# Patient Record
Sex: Male | Born: 1960
Health system: Southern US, Community
[De-identification: ages and names within clinical notes are randomized; demographics above are authoritative.]

## PROBLEM LIST (undated history)

## (undated) DIAGNOSIS — I77810 Thoracic aortic ectasia: Secondary | ICD-10-CM

## (undated) DIAGNOSIS — I639 Cerebral infarction, unspecified: Secondary | ICD-10-CM

## (undated) DIAGNOSIS — R002 Palpitations: Secondary | ICD-10-CM

## (undated) DIAGNOSIS — I509 Heart failure, unspecified: Secondary | ICD-10-CM

## (undated) DIAGNOSIS — K5900 Constipation, unspecified: Secondary | ICD-10-CM

## (undated) DIAGNOSIS — I10 Essential (primary) hypertension: Secondary | ICD-10-CM

## (undated) DIAGNOSIS — R479 Unspecified speech disturbances: Secondary | ICD-10-CM

## (undated) DIAGNOSIS — F419 Anxiety disorder, unspecified: Secondary | ICD-10-CM

## (undated) DIAGNOSIS — E785 Hyperlipidemia, unspecified: Secondary | ICD-10-CM

## (undated) DIAGNOSIS — M79606 Pain in leg, unspecified: Secondary | ICD-10-CM

## (undated) DIAGNOSIS — M25561 Pain in right knee: Secondary | ICD-10-CM

## (undated) DIAGNOSIS — N400 Enlarged prostate without lower urinary tract symptoms: Secondary | ICD-10-CM

## (undated) DIAGNOSIS — F1990 Other psychoactive substance use, unspecified, uncomplicated: Secondary | ICD-10-CM

## (undated) DIAGNOSIS — G709 Myoneural disorder, unspecified: Secondary | ICD-10-CM

## (undated) DIAGNOSIS — G4733 Obstructive sleep apnea (adult) (pediatric): Principal | ICD-10-CM

## (undated) DIAGNOSIS — C801 Malignant (primary) neoplasm, unspecified: Secondary | ICD-10-CM

## (undated) DIAGNOSIS — I2510 Atherosclerotic heart disease of native coronary artery without angina pectoris: Secondary | ICD-10-CM

## (undated) DIAGNOSIS — F32A Depression, unspecified: Secondary | ICD-10-CM

## (undated) DIAGNOSIS — R5383 Other fatigue: Secondary | ICD-10-CM

## (undated) DIAGNOSIS — M25562 Pain in left knee: Secondary | ICD-10-CM

## (undated) DIAGNOSIS — Z9289 Personal history of other medical treatment: Secondary | ICD-10-CM

## (undated) DIAGNOSIS — G8929 Other chronic pain: Secondary | ICD-10-CM

## (undated) DIAGNOSIS — J449 Chronic obstructive pulmonary disease, unspecified: Secondary | ICD-10-CM

## (undated) DIAGNOSIS — J45909 Unspecified asthma, uncomplicated: Secondary | ICD-10-CM

## (undated) DIAGNOSIS — F1010 Alcohol abuse, uncomplicated: Secondary | ICD-10-CM

## (undated) DIAGNOSIS — F329 Major depressive disorder, single episode, unspecified: Secondary | ICD-10-CM

## (undated) DIAGNOSIS — M1990 Unspecified osteoarthritis, unspecified site: Secondary | ICD-10-CM

## (undated) DIAGNOSIS — G4730 Sleep apnea, unspecified: Secondary | ICD-10-CM

## (undated) DIAGNOSIS — M2550 Pain in unspecified joint: Secondary | ICD-10-CM

## (undated) DIAGNOSIS — K219 Gastro-esophageal reflux disease without esophagitis: Secondary | ICD-10-CM

## (undated) DIAGNOSIS — T7840XA Allergy, unspecified, initial encounter: Secondary | ICD-10-CM

## (undated) HISTORY — DX: Unspecified speech disturbances: R47.9

## (undated) HISTORY — DX: Other chronic pain: G89.29

## (undated) HISTORY — DX: Thoracic aortic ectasia: I77.810

## (undated) HISTORY — DX: Pain in unspecified joint: M25.50

## (undated) HISTORY — DX: Allergy, unspecified, initial encounter: T78.40XA

## (undated) HISTORY — DX: Myoneural disorder, unspecified: G70.9

## (undated) HISTORY — DX: Alcohol abuse, uncomplicated: F10.10

## (undated) HISTORY — DX: Cerebral infarction, unspecified: I63.9

## (undated) HISTORY — PX: OTHER SURGICAL HISTORY: SHX169

## (undated) HISTORY — DX: Unspecified osteoarthritis, unspecified site: M19.90

## (undated) HISTORY — DX: Pain in right knee: M25.561

## (undated) HISTORY — DX: Anxiety disorder, unspecified: F41.9

## (undated) HISTORY — PX: UPPER GASTROINTESTINAL ENDOSCOPY: SHX188

## (undated) HISTORY — DX: Essential (primary) hypertension: I10

## (undated) HISTORY — DX: Depression, unspecified: F32.A

## (undated) HISTORY — PX: COLONOSCOPY: SHX174

## (undated) HISTORY — DX: Hyperlipidemia, unspecified: E78.5

## (undated) HISTORY — DX: Obstructive sleep apnea (adult) (pediatric): G47.33

## (undated) HISTORY — DX: Chronic obstructive pulmonary disease, unspecified: J44.9

## (undated) HISTORY — DX: Benign prostatic hyperplasia without lower urinary tract symptoms: N40.0

## (undated) HISTORY — DX: Pain in leg, unspecified: M79.606

## (undated) HISTORY — DX: Heart failure, unspecified: I50.9

## (undated) HISTORY — DX: Personal history of other medical treatment: Z92.89

## (undated) HISTORY — DX: Constipation, unspecified: K59.00

## (undated) HISTORY — DX: Pain in left knee: M25.562

## (undated) HISTORY — DX: Sleep apnea, unspecified: G47.30

## (undated) HISTORY — DX: Atherosclerotic heart disease of native coronary artery without angina pectoris: I25.10

## (undated) HISTORY — DX: Major depressive disorder, single episode, unspecified: F32.9

## (undated) HISTORY — DX: Palpitations: R00.2

## (undated) HISTORY — DX: Other fatigue: R53.83

## (undated) HISTORY — DX: Malignant (primary) neoplasm, unspecified: C80.1

## (undated) HISTORY — DX: Other psychoactive substance use, unspecified, uncomplicated: F19.90

---

## 1999-05-28 ENCOUNTER — Encounter: Payer: Self-pay | Admitting: Emergency Medicine

## 1999-05-28 ENCOUNTER — Emergency Department (HOSPITAL_COMMUNITY): Admission: EM | Admit: 1999-05-28 | Discharge: 1999-05-28 | Payer: Self-pay | Admitting: Emergency Medicine

## 2004-10-27 DIAGNOSIS — G4733 Obstructive sleep apnea (adult) (pediatric): Secondary | ICD-10-CM

## 2004-10-27 HISTORY — DX: Obstructive sleep apnea (adult) (pediatric): G47.33

## 2005-02-06 ENCOUNTER — Ambulatory Visit: Payer: Self-pay

## 2008-10-27 HISTORY — PX: KNEE ARTHROSCOPY: SUR90

## 2008-12-19 ENCOUNTER — Ambulatory Visit (HOSPITAL_COMMUNITY): Admission: RE | Admit: 2008-12-19 | Discharge: 2008-12-20 | Payer: Self-pay | Admitting: Orthopedic Surgery

## 2009-10-27 HISTORY — PX: COLONOSCOPY: SHX174

## 2010-03-22 ENCOUNTER — Encounter: Payer: Self-pay | Admitting: Physician Assistant

## 2011-01-23 ENCOUNTER — Ambulatory Visit (INDEPENDENT_AMBULATORY_CARE_PROVIDER_SITE_OTHER): Payer: BC Managed Care – PPO | Admitting: Cardiology

## 2011-01-23 ENCOUNTER — Encounter: Payer: Self-pay | Admitting: Cardiology

## 2011-01-23 VITALS — BP 131/94 | HR 74 | Ht 76.0 in | Wt 301.2 lb

## 2011-01-23 DIAGNOSIS — E785 Hyperlipidemia, unspecified: Secondary | ICD-10-CM | POA: Insufficient documentation

## 2011-01-23 DIAGNOSIS — I10 Essential (primary) hypertension: Secondary | ICD-10-CM | POA: Insufficient documentation

## 2011-01-23 DIAGNOSIS — R079 Chest pain, unspecified: Secondary | ICD-10-CM | POA: Insufficient documentation

## 2011-01-23 NOTE — Progress Notes (Signed)
50 yo with history of DM, hyperlipidemia, and HTN presents for evaluation of left arm numbness and tingling.  Patient gets tingling down his left arm from the upper chest to the fingertips.  This occurs 3-4 times a day and lasts for about 2-3 minutes at a time.  It gets better if he shakes his arm.  No definite trigger though the symptoms sometimes occur if he is sitting in the car with his arm out the window or if he is sitting in one place for a long period of time.  No chest pain.  No exertional dyspnea.  He is quite active at his work and has no exercise limitations.  He recently was started on pravastatin for elevated LDL cholesterol.  It is now down to 113.  Patient is a nonsmoker. He is on metformin for his diabetes.    ECG: NSR, borderline 1st degree AV block  Labs (3/12): K 4, creatinine 1.03, LDL 113, HDL 33  PMH:  1. DM 2. Hyperlipidemia 3. HTN  SH: Nonsmoker.  Married, lives in [place].  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    FH: Adopted, does not know any family medical history.    ROS: All systems reviewed and negative except as per HPI.   Current Outpatient Prescriptions  Medication Sig Dispense Refill  . aspirin 81 MG tablet Take 81 mg by mouth at bedtime.        . felodipine (PLENDIL) 10 MG 24 hr tablet Take 10 mg by mouth daily.        Marland Kitchen HYDROcodone-acetaminophen (VICODIN ES) 7.5-750 MG per tablet Take 1 tablet by mouth every 6 (six) hours as needed.        Marland Kitchen ibuprofen (ADVIL,MOTRIN) 200 MG tablet Take 400 mg by mouth every 6 (six) hours as needed.        Marland Kitchen lisinopril-hydrochlorothiazide (PRINZIDE) 10-12.5 MG per tablet Take 1 tablet by mouth daily.        . metFORMIN (GLUCOPHAGE) 500 MG tablet Take 500 mg by mouth. 1 in the morning and 1/2 tablet at bedtime       . pravastatin (PRAVACHOL) 20 MG tablet Take 20 mg by mouth daily.          BP 131/94  Pulse 74  Ht 6\' 4"  (1.93 m)  Wt 301 lb 4 oz (136.646 kg)  BMI 36.67 kg/m2 General:  NAD, overweight.  Neck: No JVD, no thyromegaly or thyroid nodule.  Lungs: Clear to auscultation bilaterally with normal respiratory effort. CV: Nondisplaced PMI.  Heart regular S1/S2, no S3/S4, no murmur.  No peripheral edema.  No carotid bruit.  Normal pedal pulses.  Abdomen: Soft, nontender, no hepatosplenomegaly, no distention.  Skin: Intact without lesions or rashes.  Neurologic: Alert and oriented x 3.  Psych: Normal affect. Extremities: No clubbing or cyanosis.  HEENT: Normal.  MSK: No pain to palpation over posterior neck or shoulder area.

## 2011-01-23 NOTE — Patient Instructions (Signed)
Schedule an appt for a stress echo.  You do not need to schedule a follow-up appt with Dr Shirlee Latch.

## 2011-01-23 NOTE — Assessment & Plan Note (Signed)
Diastolic BP mildly elevated today.  No change to regimen.

## 2011-01-23 NOTE — Assessment & Plan Note (Signed)
Goal LDL < 100 with DM.  He is at 113.  He is trying to work on diet/exercise to lower cholesterol.  If he is not able to get to goal with lifestyle changes, would increase pravastatin to 40 mg daily.

## 2011-01-23 NOTE — Assessment & Plan Note (Signed)
Patient gets a tingling sensation from his upper chest into his left arm all the way to the fingertips.  This is not related to exertion.  My strongest suspicion is that this is neuropathic pain, perhaps from cervical osteoarthritis and impingement on nerve roots.  However, he has a number of risk factors for coronary disease, including DM, HTN, and hyperlipidemia.  I will arrange for him to get a stress echocardiogram for risk stratification.  He should continue ASA 81 mg daily.

## 2011-02-07 ENCOUNTER — Ambulatory Visit (HOSPITAL_COMMUNITY): Payer: BC Managed Care – PPO | Attending: Cardiology | Admitting: Radiology

## 2011-02-07 ENCOUNTER — Other Ambulatory Visit (HOSPITAL_COMMUNITY): Payer: Self-pay | Admitting: Radiology

## 2011-02-07 ENCOUNTER — Other Ambulatory Visit (INDEPENDENT_AMBULATORY_CARE_PROVIDER_SITE_OTHER): Payer: BC Managed Care – PPO | Admitting: *Deleted

## 2011-02-07 ENCOUNTER — Other Ambulatory Visit: Payer: Self-pay | Admitting: *Deleted

## 2011-02-07 ENCOUNTER — Other Ambulatory Visit: Payer: Self-pay | Admitting: Cardiology

## 2011-02-07 ENCOUNTER — Encounter: Payer: Self-pay | Admitting: *Deleted

## 2011-02-07 DIAGNOSIS — I729 Aneurysm of unspecified site: Secondary | ICD-10-CM

## 2011-02-07 DIAGNOSIS — R0989 Other specified symptoms and signs involving the circulatory and respiratory systems: Secondary | ICD-10-CM

## 2011-02-07 DIAGNOSIS — R079 Chest pain, unspecified: Secondary | ICD-10-CM

## 2011-02-07 DIAGNOSIS — R072 Precordial pain: Secondary | ICD-10-CM

## 2011-02-07 DIAGNOSIS — I719 Aortic aneurysm of unspecified site, without rupture: Secondary | ICD-10-CM

## 2011-02-07 LAB — BASIC METABOLIC PANEL
BUN: 14 mg/dL (ref 6–23)
CO2: 28 mEq/L (ref 19–32)
Calcium: 9.2 mg/dL (ref 8.4–10.5)
Chloride: 104 mEq/L (ref 96–112)
Creatinine, Ser: 0.8 mg/dL (ref 0.4–1.5)
GFR: 127.7 mL/min (ref >60.00)
Glucose, Bld: 87 mg/dL (ref 70–99)
Potassium: 3.7 mEq/L (ref 3.5–5.1)
Sodium: 141 mEq/L (ref 135–145)

## 2011-02-11 LAB — GLUCOSE, CAPILLARY
Glucose-Capillary: 125 mg/dL — ABNORMAL HIGH (ref 70–99)
Glucose-Capillary: 136 mg/dL — ABNORMAL HIGH (ref 70–99)
Glucose-Capillary: 144 mg/dL — ABNORMAL HIGH (ref 70–99)
Glucose-Capillary: 155 mg/dL — ABNORMAL HIGH (ref 70–99)
Glucose-Capillary: 162 mg/dL — ABNORMAL HIGH (ref 70–99)
Glucose-Capillary: 164 mg/dL — ABNORMAL HIGH (ref 70–99)

## 2011-02-11 LAB — COMPREHENSIVE METABOLIC PANEL
ALT: 27 U/L (ref 0–53)
AST: 16 U/L (ref 0–37)
Albumin: 4 g/dL (ref 3.5–5.2)
Alkaline Phosphatase: 60 U/L (ref 39–117)
BUN: 13 mg/dL (ref 6–23)
CO2: 28 mEq/L (ref 19–32)
Calcium: 9.8 mg/dL (ref 8.4–10.5)
Chloride: 103 mEq/L (ref 96–112)
Creatinine, Ser: 0.83 mg/dL (ref 0.4–1.5)
GFR calc Af Amer: >60 mL/min (ref >60)
GFR calc non Af Amer: >60 mL/min (ref >60)
Glucose, Bld: 164 mg/dL — ABNORMAL HIGH (ref 70–99)
Potassium: 3.7 mEq/L (ref 3.5–5.1)
Sodium: 138 mEq/L (ref 135–145)
Total Bilirubin: 0.9 mg/dL (ref 0.3–1.2)
Total Protein: 6.7 g/dL (ref 6.0–8.3)

## 2011-02-11 LAB — CBC
HCT: 42.1 % (ref 39.0–52.0)
Hemoglobin: 14.3 g/dL (ref 13.0–17.0)
MCHC: 34 g/dL (ref 30.0–36.0)
MCV: 86.7 fL (ref 78.0–100.0)
Platelets: 247 10*3/uL (ref 150–400)
RBC: 4.86 MIL/uL (ref 4.22–5.81)
RDW: 13.9 % (ref 11.5–15.5)
WBC: 7.9 10*3/uL (ref 4.0–10.5)

## 2011-02-12 ENCOUNTER — Ambulatory Visit (HOSPITAL_COMMUNITY)
Admission: RE | Admit: 2011-02-12 | Discharge: 2011-02-12 | Disposition: A | Discharge status: Home / Self Care | Payer: BC Managed Care – PPO | Source: Ambulatory Visit | Attending: Cardiology | Admitting: Cardiology

## 2011-02-12 ENCOUNTER — Ambulatory Visit (HOSPITAL_BASED_OUTPATIENT_CLINIC_OR_DEPARTMENT_OTHER): Payer: BC Managed Care – PPO | Admitting: Radiology

## 2011-02-12 DIAGNOSIS — R0609 Other forms of dyspnea: Secondary | ICD-10-CM

## 2011-02-12 DIAGNOSIS — I719 Aortic aneurysm of unspecified site, without rupture: Secondary | ICD-10-CM

## 2011-02-12 DIAGNOSIS — R0989 Other specified symptoms and signs involving the circulatory and respiratory systems: Secondary | ICD-10-CM

## 2011-02-12 DIAGNOSIS — E119 Type 2 diabetes mellitus without complications: Secondary | ICD-10-CM

## 2011-02-12 DIAGNOSIS — R0602 Shortness of breath: Secondary | ICD-10-CM

## 2011-02-12 MED ORDER — TECHNETIUM TC 99M TETROFOSMIN IV KIT
33.0000 | PACK | Freq: Once | INTRAVENOUS | Status: AC | PRN
  Administered 2011-02-12: 33 via INTRAVENOUS

## 2011-02-12 MED ORDER — REGADENOSON 0.4 MG/5ML IV SOLN
0.4000 mg | Freq: Once | INTRAVENOUS | Status: AC
  Administered 2011-02-12: 0.4 mg via INTRAVENOUS

## 2011-02-12 MED ORDER — GADOBENATE DIMEGLUMINE 529 MG/ML IV SOLN
20.0000 mL | Freq: Once | INTRAVENOUS | Status: AC
  Administered 2011-02-12: 20 mL via INTRAVENOUS

## 2011-02-12 MED ORDER — TECHNETIUM TC 99M TETROFOSMIN IV KIT
11.0000 | PACK | Freq: Once | INTRAVENOUS | Status: AC | PRN
  Administered 2011-02-12: 11 via INTRAVENOUS

## 2011-02-12 NOTE — Progress Notes (Addendum)
[facility] SITE 3 NUCLEAR MED [address] [place] [address] [phone]  Cardiology Nuclear Med Study  PARAM CAPRI is a 50 y.o. male [id number] 02-Feb-1961   Nuclear Med Background Indication for Stress Test:  Evaluation for Ischemia and Post Hospital History:  No previous documented CAD Cardiac Risk Factors: History of Smoking, Hypertension, Lipids and NIDDM  Symptoms:  Dizziness, DOE and SOB   Nuclear Pre-Procedure Caffeine/Decaff Intake:  None NPO After: 7:30am   Lungs:  clear IV 0.9% NS with Angio Cath:  22g  IV Site: L Antecubital  IV Started by:  Hospital staff  Chest Size (in):  52 Cup Size: n/a  Height: 6\' 4"  (1.93 m)  Weight:  300 lb (136.079 kg)  BMI:  Body mass index is 36.52 kg/(m^2). Tech Comments:  NA    Nuclear Med Study 1 or 2 day study: 1 day  Stress Test Type:  Eugenie Birks  Reading MD: [name], MD  Order Authorizing Provider: D.Adelynn Gipe  Resting Radionuclide: Technetium 61m Tetrofosmin  Resting Radionuclide Dose: 11.0 mCi   Stress Radionuclide:  Technetium 20m Tetrofosmin  Stress Radionuclide Dose: 33.0 mCi           Stress Protocol Rest HR: 64 Stress HR: 90  Rest BP: 112/81 Stress BP: 129/62  Exercise Time (min): n/a METS: n/a   Predicted Max HR: 170 bpm % Max HR: 52.94 bpm Rate Pressure Product: 40102   Dose of Adenosine (mg):  n/a Dose of Lexiscan: 0.4 mg  Dose of Atropine (mg): n/a Dose of Dobutamine: n/a mcg/kg/min (at max HR)  Stress Test Technologist: Milana Na, EMT-P  Nuclear Technologist:  Doyne Keel, CNMT     Rest Procedure:  Myocardial perfusion imaging was performed at rest 45 minutes following the intravenous administration of Technetium 60m Tetrofosmin. Rest ECG: NSR - Normal EKG and NSR  Stress Procedure:  The patient received IV Lexiscan 0.4 mg over 15-seconds.  Technetium 10m Tetrofosmin injected at 30-seconds.  There were no significant changes with Lexiscan.  Quantitative spect images  were obtained after a 45 minute delay. Stress ECG: No significant change from baseline ECG  QPS Raw Data Images:  Normal; no motion artifact; normal heart/lung ratio. Stress Images:  Normal homogeneous uptake in all areas of the myocardium. Rest Images:  Normal homogeneous uptake in all areas of the myocardium. Subtraction (SDS):  There is no evidence of scar or ischemia. Transient Ischemic Dilatation (Normal <1.22):  1.11 Lung/Heart Ratio (Normal <0.45):  0.27  Quantitative Gated Spect Images QGS EDV:139   ml QGS ESV:  53 ml QGS cine images:  NL LV Function; NL Wall Motion QGS EF: 62%  Impression Exercise Capacity:  Lexiscan with no exercise. BP Response:  Hypotensive blood pressure response. Clinical Symptoms:  Shortness of breath.  ECG Impression:  No significant ST segment change suggestive of ischemia. Comparison with Prior Nuclear Study: No images to compare  Overall Impression:  Normal stress nuclear study.  Eren Ryser Shirlee Latch   Normal study [name]

## 2011-02-12 NOTE — Progress Notes (Signed)
Labs ok.

## 2011-02-13 ENCOUNTER — Encounter (HOSPITAL_COMMUNITY): Payer: Self-pay | Admitting: Cardiology

## 2011-02-13 NOTE — Progress Notes (Signed)
ROUTED TO DR. MCLEAN.Falecha Clark ° ° °

## 2011-02-18 ENCOUNTER — Ambulatory Visit (INDEPENDENT_AMBULATORY_CARE_PROVIDER_SITE_OTHER): Payer: BC Managed Care – PPO | Admitting: Physician Assistant

## 2011-02-18 ENCOUNTER — Encounter: Payer: Self-pay | Admitting: Physician Assistant

## 2011-02-18 VITALS — BP 130/80 | HR 72 | Resp 18 | Ht 72.0 in | Wt 304.8 lb

## 2011-02-18 DIAGNOSIS — I77819 Aortic ectasia, unspecified site: Secondary | ICD-10-CM

## 2011-02-18 DIAGNOSIS — R079 Chest pain, unspecified: Secondary | ICD-10-CM

## 2011-02-18 DIAGNOSIS — I10 Essential (primary) hypertension: Secondary | ICD-10-CM

## 2011-02-18 DIAGNOSIS — I77810 Thoracic aortic ectasia: Secondary | ICD-10-CM | POA: Insufficient documentation

## 2011-02-18 MED ORDER — METOPROLOL SUCCINATE ER 25 MG PO TB24: 25.0000 mg | ORAL_TABLET | Freq: Every day | ORAL | Status: DC

## 2011-02-18 NOTE — Assessment & Plan Note (Signed)
This appears noncardiac.  He can follow up with his PCP for further evaluation of his arm numbness.

## 2011-02-18 NOTE — Progress Notes (Signed)
History of Present Illness: Primary Cardiologist:  Dr. [name]  Jonathan Miles is a 50 y.o. male with DM2, HTN and HLP who saw Dr. Shirlee Latch for chest pain on 3/29.  He was set up for a stress echo, but this demonstrated a dilated aortic root.  LVF is normal.  He was then set up for a myoview which was done on 4/18 and demonstrated no ischemia.  MRA was done and demonstrates aortic root diameter of 5.5 cm.  He is brought back to review his test results.  He continues to have left arm numbness that is transient and does not seem to be related to any activity.  He denies dyspnea or syncope.  No orthopnea, PND or edema.  I reviewed the results of his MRA with him today.  He was also seen by Dr. Shirlee Latch.  We discussed that his aortic root diameter is at the cutoff for repair, but may be less severe for him given his size.  We discussed referring him to cardiac surgery for a surgical opinion.  His wife's mother did see Dr. Tyrone Sage in the past.    Past Medical History  Diagnosis Date  . Hypertension   . Hyperlipidemia   . Diabetes mellitus     type 2  . Sleep apnea     does not use his C-Pap  . Chest pain     Myoview 4/12: EF 62%, no ischemia or scar  . Aortic root dilatation     a. echo 4/12: EF 55-60%, mod to marked dilated ascending aortic root;   b. MRA 4/12: Aortic root 5.5 cm, dilation of left and right coronary cusps, trileaflet aortic valve    Current Outpatient Prescriptions  Medication Sig Dispense Refill  . aspirin 81 MG tablet Take 81 mg by mouth daily.        . felodipine (PLENDIL) 10 MG 24 hr tablet Take 10 mg by mouth daily.        Marland Kitchen HYDROcodone-acetaminophen (VICODIN ES) 7.5-750 MG per tablet Take 1 tablet by mouth every 6 (six) hours as needed.        Marland Kitchen ibuprofen (ADVIL,MOTRIN) 200 MG tablet Take 400 mg by mouth every 6 (six) hours as needed.        Marland Kitchen lisinopril-hydrochlorothiazide (PRINZIDE) 10-12.5 MG per tablet Take 1 tablet by mouth daily.        . metFORMIN (GLUCOPHAGE)  500 MG tablet Take 500 mg by mouth. 1 in the morning and 1/2 tablet at bedtime       . pravastatin (PRAVACHOL) 20 MG tablet Take 20 mg by mouth daily.        . metoprolol succinate (TOPROL XL) 25 MG 24 hr tablet Take 1 tablet (25 mg total) by mouth daily.  30 tablet  11  . DISCONTD: aspirin 81 MG tablet Take 81 mg by mouth at bedtime.          No Known Allergies  History  Substance Use Topics  . Smoking status: Former Smoker -- 1.0 packs/day for 13 years    Types: Cigarettes    Quit date: 01/23/1988  . Smokeless tobacco: Never Used  . Alcohol Use: Not on file    Family History  Problem Relation Age of Onset  . Adopted: Yes     Vital Signs: BP 130/80  Pulse 72  Resp 18  Ht 6' (1.829 m)  Wt 304 lb 12.8 oz (138.256 kg)  BMI 41.34 kg/m2  PHYSICAL EXAM: Well nourished, well developed,  in no acute distress HEENT: normal Neck: no JVD Cardiac:  normal S1, S2; RRR; no murmur Lungs:  clear to auscultation bilaterally, no wheezing, rhonchi or rales Abd: soft, nontender, no hepatomegaly Ext: no edema Skin: warm and dry Neuro:  CNs 2-12 intact, no focal abnormalities noted  ASSESSMENT AND PLAN:

## 2011-02-18 NOTE — Assessment & Plan Note (Signed)
We will refer him to Dr. Tyrone Sage for his opinion and recommendations regarding surgical intervention vs. continued monitoring.  The patient has been advised to lift no more than 40 pounds.  His BP has improved over time and actually had to have his ACE cut back some months ago due to lower BPs.  However, he would benefit from beta blocker therapy.  We will put him on Toprol XL 25 mg QD.  He thinks he was on this in the past.  He will check his records and let us know if there is any reason he cannot take it.  Arrange follow up with Dr. Shirlee Latch in 3 months.

## 2011-02-18 NOTE — Assessment & Plan Note (Signed)
Add Toprol XL 25 mg QD.

## 2011-02-18 NOTE — Patient Instructions (Signed)
You have been referred to DR. 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  Your physician has recommended you make the following change in your medication: START TOPROL XL 25 MG 1 TAB DAILY  Your physician recommends that you schedule a follow-up appointment in: 3 MONTHS WITH DR. Shirlee Latch

## 2011-02-20 ENCOUNTER — Encounter (INDEPENDENT_AMBULATORY_CARE_PROVIDER_SITE_OTHER): Payer: BC Managed Care – PPO | Admitting: Cardiothoracic Surgery

## 2011-02-20 DIAGNOSIS — I712 Thoracic aortic aneurysm, without rupture, unspecified: Secondary | ICD-10-CM

## 2011-02-20 NOTE — Progress Notes (Signed)
appt 02/18/11 with Tereso Newcomer.

## 2011-02-21 NOTE — H&P (Signed)
HISTORY AND PHYSICAL EXAMINATION  February 20, 2011  Re:  Jonathan Miles, Jonathan Miles        DOB:  1961/06/29  REQUESTING PHYSICIAN:  Dr. Shirlee Latch.  PRIMARY CARE PHYSICIAN:  Gean Larose. Alwyn Ren, MD  REASON FOR CONSULTATION VISIT:  Dilated aortic root.  HISTORY OF PRESENT ILLNESS:  The patient is a 50 year old male with no previous known cardiac history who presented to his primary care doctor with intermittent numbness in his left arm, not associated with exertion, because of his symptoms he was referred for stress echo.  At the time of the echocardiogram, the stress portion was never performed because the echo revealed some evidence of dilatation of his aortic root and further testing was done.  He did ultimately have a Cardiolite nuclear stress test that did not show evidence of ischemia.  An MRI was also performed and the patient was referred by Dr. Shirlee Latch for evaluation.  He has had no previous history of myocardial infarction. Does have a history of hypertension treated, history of diabetes was diagnosed in 2010, most recent hemoglobin A1c 6.7.  He briefly was a smoker for 13 years, quit 1989.  His family history is unknown.  He has had no previous stroke, no claudication.  No renal insufficiency.  Does have a history of sleep apnea.  PAST SURGERIES:  Left knee arthroscopy.  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  Denies alcohol use.  CURRENT MEDICATIONS: 1. Aspirin 81 mg a day. 2. Plendil 10 mg daily. 3. Vicodin 7.5/750 p.r.n. 4. Ibuprofen 200 mg. 5. Lisinopril/hydrochlorothiazide 10/12.5 a day. 6. Metformin 500 one in the morning and half at night. 7. Pravastatin 20 mg a day. 8. He was just started on metoprolol 25 mg. 9. Toprol-XL 25 mg a day, though he is not started taking this.  He has no known allergies.  REVIEW OF SYSTEMS:  CARDIAC REVIEW OF SYSTEMS:   Negative for chest pain, resting shortness of breath, has mild exertional shortness of breath if he climbs stairs quickly or run.  He denies orthopnea, denies syncope, has had some dizziness.  Denies palpitations, mild ankle edema when he has been on his feet working all day. GENERAL REVIEW OF SYSTEMS:  Denies any constitutional symptoms.  He has had weight loss recently, on purpose he is tried to go on a weight loss diet and has lost over the past several months 33 pounds.  Denies amaurosis.  Denies TIAs.  Denies hemoptysis.  Denies wheezing.  He has had no change in bowel habits, had a colonoscopy 1 year ago.  He is up- to-date on both flu vaccination and pneumococcal vaccination.  He has had episode of small amount of blood in the stool last year which precipitated, colonoscopy none since.  Other review of systems are negative.  On physical exam, his blood pressure is 120/78, pulse is 82, respiratory rate is 18, O2 sat on room air is 93%.  The patient is 6 feet 4 inches tall and weighs 300 pounds.  As calculated BMI is 36.5 consistent with very high index of obesity.  He has no carotid bruits.  His lungs are clear bilaterally.  I do not appreciate any murmur of aortic insufficiency or aortic stenosis.  Abdominal exam shows significant obesity, do not palpate any dilatation of his abdominal aorta, but with his size, this would be difficult.  Lower extremities are without edema. He has 2+ DP and  PT pulses bilaterally and brachial and radial pulses equal bilaterally.  Further examination, the patient has none of the stigmata of Marfan's.  His uvula is not split.  TESTS:  The patient's echocardiogram and MRI revealed approximately 5-cm aortic root, though very quickly to sinotubular ridges becomes much more normal-sized ascending aorta, less than approximately 3.8 cm.  This dilatation appears mostly at the left and right coronary cusp.  The portion of the aorta was dilated it is right  at the aortic root in the coronary cusp which is particularly difficult area to get accurate measurements.  The echocardiogram shows no evidence of aortic insufficiency or aortic stenosis.  He has a tricuspid leaflet, aortic valve, and ejection fraction of 55-60%.  The measurements of aortic root diameter ranged from 24.8 and 5.4 cm.  IMPRESSION:  The patient with very large surface area with an ascending aorta of 3.8 cm just above with the aortic root sinus of Valsalva's dilatation, somewhere between 5 and 5.5 cm without evidence of aortic insufficiency or aortic stenosis and with a trileaflet valve.  We do not have details of the patient's family history specifically history of family members with acute aortic dissections or aortic aneurysms.  I have reviewed the findings with the patient, the importance of continued beta-blockers, at this point with his large body size and size of his aorta is right at the borderline of recommending any elective root replacement especially in the light currently that he has no valve pathology.  I have stressed to him the importance of continuing to start taking the beta blockers as prescribed and be careful monitoring of his blood pressure, avoid strenuous lifting and/or Valsalva maneuvers.  We will continue to follow him closely.  I will plan to see him back in 3 months.  During that period of time, we discussed that he had already started to track down his adoptive medical history.  The signs and symptoms and risks of aortic dissection were discussed with he and his wife in detail, any change in symptoms, he is aware to seek medical attention immediately.  It does not appear that his aortic situation is related to the numbness in his left arm and he will continue with the workup of possible cervical disk disease with his primary medical doctor as noted.  I will plan to see him back in 3 months.  Sheliah Plane, MD Electronically Signed  EG/MEDQ   D:  02/20/2011  T:  02/21/2011  Job:  [id number]  cc:   [name], MD Peyton Najjar, MD

## 2011-02-27 ENCOUNTER — Encounter: Payer: BC Managed Care – PPO | Admitting: Cardiothoracic Surgery

## 2011-03-11 NOTE — Op Note (Signed)
NAME:  Jonathan Miles, Jonathan Miles NO.:  [id number]   MEDICAL RECORD NO.:  [id number]          PATIENT TYPE:  OIB   LOCATION:  1510                         FACILITY:  [facility]   PHYSICIAN:  Georges Lynch. Gioffre, M.D.DATE OF BIRTH:  July 13, 1961   DATE OF PROCEDURE:  12/19/2008  DATE OF DISCHARGE:                               OPERATIVE REPORT   SURGEON:  Georges Lynch. Darrelyn Hillock, M.D.   ASSISTANT:  Nurse.   PREOPERATIVE DIAGNOSES:  1. Torn medial meniscus left knee.  2. Torn lateral meniscus left knee.  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  4. Loose body left knee.   POSTOPERATIVE DIAGNOSES:  1. Torn medial meniscus left knee.  2. Torn lateral meniscus left knee.  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  4. Loose body left knee.   PROCEDURE:  1. Diagnostic arthroscopy left knee.  2. Excision of loose body left knee.  3. Partial medial meniscectomy left knee.  4. Partial lateral meniscectomy left knee.  5. Abrasion chondroplasty of the patella left knee.   PROCEDURE IN DETAIL:  Under general anesthesia routine orthopedic prep  and draping the left lower extremity carried out.  He had 2 grams of IV  Ancef.  At this time a small punctate incision was made in the  suprapatellar pouch.  Inflow cannula was inserted.  Knee was distended  with saline.  Another small punctate incision was made in the  anterolateral joint.  The arthroscope was entered from the lateral  approach and a complete diagnostic arthroscopy was carried out.  At this  time I went up in the suprapatellar pouch region and noted there was  rather significant chondromalacia of the patella.  I first introduced  shaver suction device and the ArthroCare and did an abrasion  chondroplasty of the patella.  I also did a synovectomy.  Following that  I went down in the medial joint.  He had a tear of the posterior horn in  the medial meniscus.  I introduced the ArthroCare and did a partial  medial meniscectomy.   The remaining part of the joint looked fine.  I  went over and inspected the cruciates.  There was a large loose body  embedded in the anterior cruciate.  I removed that as well in the usual  fashion.  Following that I went over to the lateral joint.  There was a  large irregular tear of the posterior horn at posterior third lateral  meniscus.  I introduced the ArthroCare and did a partial lateral  meniscectomy.  Following that I probed the menisci to make sure they  both now were intact.  I thoroughly irrigated out the knee and removed  all the fluid and closed all  four punctate incisions with 3-0 nylon suture.  Note, I made an  additional small incision superolaterally in order to do the abrasion  chondroplasty of the patella.  I injected 30 mL of 0.5% Marcaine with  epinephrine into the knee joint.  Sterile Neosporin bundle dressing was  applied.  The patient had 2 grams of IV Ancef preop.  ______________________________  Georges Lynch Darrelyn Hillock, M.D.     RAG/MEDQ  D:  12/19/2008  T:  12/20/2008  Job:  [id number]

## 2011-05-21 ENCOUNTER — Encounter (HOSPITAL_COMMUNITY): Payer: Self-pay | Admitting: Cardiology

## 2011-05-22 ENCOUNTER — Ambulatory Visit: Payer: BC Managed Care – PPO | Admitting: Cardiology

## 2011-05-29 ENCOUNTER — Ambulatory Visit (INDEPENDENT_AMBULATORY_CARE_PROVIDER_SITE_OTHER): Payer: BC Managed Care – PPO | Admitting: Cardiothoracic Surgery

## 2011-05-29 DIAGNOSIS — I712 Thoracic aortic aneurysm, without rupture: Secondary | ICD-10-CM

## 2011-05-30 NOTE — Assessment & Plan Note (Signed)
OFFICE VISIT  Jonathan Miles, Jonathan Miles DOB:  1961-06-13                                        May 30, 2011 CHART #:  [id number]  The patient returns to the office in followup from initial consultation done in April 2012.  The patient had intermittent numbness in his left arm and because of this was referred for a stress echo.  At the time of echo, there was noted dilatation of his aortic root without aortic insufficiency.  Cardiolite nuclear test did not show any evidence of ischemia.  MRI was performed that showed dilatation of his aortic root of about 4.8 cm.  There was no evidence of aortic insufficiency.  No cardiac catheterization has been performed.  The patient is a large individual, it is unclear the time course of this dilatation on the MRIs.  The root has dilated but that is tubular ridge and the remainder of the aorta is approximately 3.8 cm.  On his last visit he noted that his family history was unknown because he was adopted, he had been trying to make attempts to obtain his adoptive medical history, has not been successful in this.  Overall, he feels well on his current blood pressure regimen.  He denies any further chest discomfort, angina, and intermittent numbness in the left hand is disappeared.  On exam, his blood pressure 126/85, pulse 72, respiratory rate is 20, O2 sats 96% on room air, and he weighs 294 pounds.  His lungs are clear bilaterally.  Cardiac exam reveals a regular rate and rhythm, I do not appreciate any murmur of aortic insufficiency.  He has no pedal edema or calf tenderness.  There is no abdominal aortic dilatation on palpation. Because of size, it is difficult to feel his abdominal aorta.  He continues on aspirin 81 mg a day, Plendil 10 mg a day, Vicodin p.r.n., ibuprofen, lisinopril, hydrochlorothiazide 10/12.5, metformin 500 one in the morning and half a tablet at night, Toprol-XL 25 a day, and pravastatin 20 mg a  day.  Overall, the patient from a symptomatic standpoint is stable, does not appear to have any progression or change of his valve on physical.  I further discussed with him to can continue with good blood pressure control and we have decided in 3 months to obtain a CTA of the chest to reevaluate his aortic root.  At some point, it is likely he will need root replacement possibly with valve sparing procedure.  We will plan to see him back in the office in 3 months which will be approximately 6 months after his initial presentation.  He will again the need for good blood pressure control including use of a beta-blocker is discussed with the patient in detail.  Sheliah Plane, MD Electronically Signed  EG/MEDQ  D:  05/30/2011  T:  05/30/2011  Job:  [id number]  cc:   [name], MD

## 2011-07-04 ENCOUNTER — Encounter: Payer: Self-pay | Admitting: Cardiology

## 2011-07-07 ENCOUNTER — Ambulatory Visit: Payer: BC Managed Care – PPO | Admitting: Cardiology

## 2011-08-13 ENCOUNTER — Other Ambulatory Visit: Payer: Self-pay | Admitting: Cardiothoracic Surgery

## 2011-08-13 DIAGNOSIS — I712 Thoracic aortic aneurysm, without rupture: Secondary | ICD-10-CM

## 2011-09-25 ENCOUNTER — Other Ambulatory Visit: Payer: Self-pay | Admitting: *Deleted

## 2011-09-25 ENCOUNTER — Encounter: Payer: Self-pay | Admitting: Cardiothoracic Surgery

## 2011-09-25 ENCOUNTER — Ambulatory Visit
Admission: RE | Admit: 2011-09-25 | Discharge: 2011-09-25 | Disposition: A | Discharge status: Home / Self Care | Payer: BC Managed Care – PPO | Source: Ambulatory Visit | Attending: Cardiothoracic Surgery | Admitting: Cardiothoracic Surgery

## 2011-09-25 ENCOUNTER — Ambulatory Visit (INDEPENDENT_AMBULATORY_CARE_PROVIDER_SITE_OTHER): Payer: BC Managed Care – PPO | Admitting: Cardiothoracic Surgery

## 2011-09-25 VITALS — BP 130/90 | HR 72 | Resp 18 | Ht 76.0 in | Wt 298.0 lb

## 2011-09-25 DIAGNOSIS — I712 Thoracic aortic aneurysm, without rupture, unspecified: Secondary | ICD-10-CM

## 2011-09-25 DIAGNOSIS — I77810 Thoracic aortic ectasia: Secondary | ICD-10-CM

## 2011-09-25 MED ORDER — IOHEXOL 350 MG/ML SOLN
100.0000 mL | Freq: Once | INTRAVENOUS | Status: AC | PRN
  Administered 2011-09-25: 100 mL via INTRAVENOUS

## 2011-09-25 NOTE — Progress Notes (Signed)
Patient ID: Jonathan Miles, male   DOB: 1961-05-13, 50 y.o.   MRN: [id number]                    [address]            [place] [address]          [phone]       MICHA DOSANJH [facility] Medical Record #[id number] Date of Birth: [birth year]  Referring: [name], MD Primary Care: Janace Hoard, MD  Chief Complaint:    Chief Complaint  Patient presents with  . Thoracic Aortic Aneurysm    3 month  f/u with ct chest to re-evluate aortic root    History of Present Illness:     Patient returns today for followup visit he was originally seen 05/29/2011. He was initially referred because of intermittent numbness in his left arm and was referred for stress echo by his primary physician. Echocardiogram showed a dilated aortic root so ultimately he had a Cardiolite nuclear tests that showed no evidence of ischemia. MRI suggested a dilatation of his aortic root he had no evidence of aortic insufficiency. He does not know his family history as he is adopted. He notes since last seen he does occasionally have intermittent tingling in his left arm. He denies any chest pain shortness of breath or other symptoms of congestive heart failure.     Past Medical History  Diagnosis Date  . Hypertension   . Hyperlipidemia   . Diabetes mellitus     type 2  . Sleep apnea     does not use his C-Pap  . Chest pain     Myoview 4/12: EF 62%, no ischemia or scar  . Aortic root dilatation     a. echo 4/12: EF 55-60%, mod to marked dilated ascending aortic root;   b. MRA 4/12: Aortic root 5.5 cm, dilation of left and right coronary cusps, trileaflet aortic valve    Past Surgical History  Procedure Date  . Knee arthroscopy     History  Smoking status  . Former Smoker -- 1.0 packs/day for 13 years  . Types: Cigarettes  . Quit date: 01/23/1988  Smokeless tobacco  . Never Used   History  Alcohol Use: Not on file    History   Social History  . Marital Status:  Married    Spouse Name: N/A    Number of Children: N/A  . Years of Education: N/A   Occupational History  . Not on file.   Social History Main Topics  . Smoking status: Former Smoker -- 1.0 packs/day for 13 years    Types: Cigarettes    Quit date: 01/23/1988  . Smokeless tobacco: Never Used  . Alcohol Use: Not on file  . Drug Use: Not on file  . Sexually Active: Not on file   Other Topics Concern  . Not on file   Social History Narrative   Married, lives in [place]. 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     No Known Allergies  Current Outpatient Prescriptions  Medication Sig Dispense Refill  . aspirin 81 MG tablet Take 81 mg by mouth daily.        . felodipine (PLENDIL) 10 MG 24 hr tablet Take 10 mg by mouth daily.        Marland Kitchen ibuprofen (ADVIL,MOTRIN) 200 MG tablet Take 400 mg by mouth every 6 (six) hours as needed.        Marland Kitchen  lisinopril-hydrochlorothiazide (PRINZIDE) 10-12.5 MG per tablet Take 1 tablet by mouth daily.        . metFORMIN (GLUCOPHAGE) 500 MG tablet Take 500 mg by mouth.       . metoprolol succinate (TOPROL XL) 25 MG 24 hr tablet Take 1 tablet (25 mg total) by mouth daily.  30 tablet  11  . pravastatin (PRAVACHOL) 20 MG tablet Take 20 mg by mouth daily.        Marland Kitchen HYDROcodone-acetaminophen (VICODIN ES) 7.5-750 MG per tablet Take 1 tablet by mouth every 6 (six) hours as needed.         No current facility-administered medications for this visit.   Facility-Administered Medications Ordered in Other Visits  Medication Dose Route Frequency Provider Last Rate Last Dose  . iohexol (OMNIPAQUE) 350 MG/ML injection 100 mL  100 mL Intravenous Once PRN Medication Radiologist   100 mL at 09/25/11 1307     (Not in a hospital admission)  Family History  Problem Relation Age of Onset  . Adopted: Yes     Review of Systems:     Cardiac Review of Systems: Y or N  Chest Pain [  n ]  Resting SOB [n ] Exertional SOB  [ n]  Orthopnea [ n ]   Pedal Edema [ n  ]      Palpitations [ n ] Syncope  [n  ]   Presyncope [n   ]  General Review of Systems: [Y] = yes [  ]=no Constitional: recent weight change [n ]; anorexia [  ]; fatigue [  ]; nausea [  ]; night sweats [  ]; fever [  ]; or chills [  ];                                                                                                                                          Dental: poor dentition[  ];  Eye : blurred vision [n]; diplopia [   ]; vision changes [  ];  Amaurosis fugax[  ]; Resp: cough [  ];  wheezing[n  ];  hemoptysis[ n ]; shortness of breath[ n ]; paroxysmal nocturnal dyspnea[n  ]; dyspnea on exertion[n  ]; or orthopnea[  ];  GI:  gallstones[  ], vomiting[  ];  dysphagia[  ]; melena[  ];  hematochezia [  ]; heartburn[  ];   Hx of  Colonoscopy[  ]; GU: kidney stones [  ]; hematuria[  ];   dysuria [  ];  nocturia[  ];  history of     obstruction [  ];             Skin: rash, swelling[  ];, hair loss[  ];  peripheral edema[  ];  or itching[  ]; Musculosketetal: myalgias[  ];  joint swelling[  ];  joint erythema[  ];  joint pain[  ];  back pain[  ];  Heme/Lymph: bruising[  ];  bleeding[  ];  anemia[  ];  Neuro: TIA[  ];  headaches[  ];  stroke[  ];  vertigo[  ];  seizures[  ];   paresthesias[  ];  difficulty walking[  ];  Psych:depression[  ]; anxiety[  ];  Endocrine: diabetes[  ];  thyroid dysfunction[  ];  Immunizations: Flu [?  ]; Pneumococcal[ ? ];  Other:  Physical Exam: BP 130/90  Pulse 72  Resp 18  Ht 6\' 4"  (1.93 m)  Wt 298 lb (135.172 kg)  BMI 36.27 kg/m2  SpO2 98%  General appearance: alert, cooperative, appears stated age and no distress Neurologic: intact Heart: regular rate and rhythm, S1, S2 normal, no murmur, click, rub or gallop Lungs: clear to auscultation bilaterally Abdomen: soft, non-tender; bowel sounds normal; no masses,  no organomegaly Extremities: extremities normal, atraumatic, no cyanosis or edema, Homans sign is negative, no sign of DVT, no edema, redness  or tenderness in the calves or thighs and no ulcers, gangrene or trophic changes   Diagnostic Studies & Laboratory data:     Recent Radiology Findings:   Ct Angio Chest W/cm &/or Wo Cm  09/25/2011  *RADIOLOGY REPORT*  Clinical Data:  Aortic root dilatation, left arm numbness for several weeks  CT ANGIOGRAPHY CHEST WITH CONTRAST  Technique:  Multidetector CT imaging of the chest was performed using the standard protocol during bolus administration of intravenous contrast.  Multiplanar CT image reconstructions including MIPs were obtained to evaluate the vascular anatomy.  Contrast: OMNIPAQUE IOHEXOL 350 MG/ML IV SOLN  Comparison:  MRA chest 02/12/2011  Findings: Aneurysmal dilatation ascending thoracic aorta and aortic root. Aortic root measures 6.1 x 5.1 cm in axial dimensions image 64, immediately proximal to left main coronary artery origin. At level of mid pulmonary artery, ascending aorta measures 4.2 x 4.1 cm image 52. Aortic arch measures 3.1 cm transverse and 2.6 cm cranial-caudal. Descending thoracic aorta at the level of the mid pulmonary artery measures 2.8 x 2.7 cm. No evidence of aortic dissection.  Few scattered coronary arterial calcifications noted. Question left ventricular hypertrophy. Calcified left hilar adenopathy and left lower lobe granuloma. No additional thoracic adenopathy seen. Numerous calcified granulomata within spleen. Question tiny gallstone image 125. Remaining visualized portions of upper abdomen unremarkable. Remaining lungs clear. No pleural effusion or pneumothorax. No acute osseous findings.  Review of the MIP images confirms the above findings.  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 Old granulomatous disease. Question cholelithiasis.  Original Report Authenticated By: Lollie Marrow, M.D.      Recent Lab Findings: Lab Results    Component Value Date   WBC 7.9 12/15/2008   HGB 14.3 12/15/2008   HCT 42.1 12/15/2008   PLT 247 12/15/2008   GLUCOSE 87 02/07/2011   ALT 27 12/15/2008   AST 16 12/15/2008   NA 141 02/07/2011   K 3.7 02/07/2011   CL 104 02/07/2011   CREATININE 0.8 02/07/2011   BUN 14 02/07/2011   CO2 28 02/07/2011      Assessment / Plan:      I discussed with the patient and reviewed with him and his wife the CT scan of the chest which suggests dilatation of aortic root and the proximal ascending aorta measuring a maximum of 6 x 5 cm, decreasing to approximately 4 cm in the mid descending aorta. Previous echo  and physical exam do not suggest aortic insufficiency. It's unclear if there has some been enlargement of his aortic root compared to the previous MRI and echo were just differences in measurement. The regardless at this point of discussed with the patient proceeding with definitive treatment possibly with valve sparing root replacement.  I've made arrangements for him to be seen back by Dr. [name] for consideration of cardiac catheterization. We will repeat his echocardiogram and I will see him following cardiac catheterization.      Delight Ovens MD 09/25/2011 5:18 PM

## 2011-10-06 ENCOUNTER — Other Ambulatory Visit (HOSPITAL_COMMUNITY): Payer: BC Managed Care – PPO | Admitting: Radiology

## 2011-10-08 ENCOUNTER — Ambulatory Visit: Payer: BC Managed Care – PPO | Admitting: Physician Assistant

## 2011-11-11 ENCOUNTER — Encounter: Payer: Self-pay | Admitting: *Deleted

## 2011-11-11 ENCOUNTER — Encounter: Payer: Self-pay | Admitting: Cardiology

## 2011-11-11 ENCOUNTER — Other Ambulatory Visit: Payer: Self-pay | Admitting: Cardiology

## 2011-11-11 ENCOUNTER — Ambulatory Visit (INDEPENDENT_AMBULATORY_CARE_PROVIDER_SITE_OTHER): Payer: BC Managed Care – PPO | Admitting: Cardiology

## 2011-11-11 ENCOUNTER — Ambulatory Visit (HOSPITAL_COMMUNITY): Payer: BC Managed Care – PPO | Attending: Cardiovascular Disease | Admitting: Radiology

## 2011-11-11 DIAGNOSIS — I77810 Thoracic aortic ectasia: Secondary | ICD-10-CM

## 2011-11-11 DIAGNOSIS — E785 Hyperlipidemia, unspecified: Secondary | ICD-10-CM | POA: Insufficient documentation

## 2011-11-11 DIAGNOSIS — R079 Chest pain, unspecified: Secondary | ICD-10-CM | POA: Insufficient documentation

## 2011-11-11 DIAGNOSIS — G4730 Sleep apnea, unspecified: Secondary | ICD-10-CM

## 2011-11-11 DIAGNOSIS — I712 Thoracic aortic aneurysm, without rupture, unspecified: Secondary | ICD-10-CM | POA: Insufficient documentation

## 2011-11-11 DIAGNOSIS — E119 Type 2 diabetes mellitus without complications: Secondary | ICD-10-CM | POA: Insufficient documentation

## 2011-11-11 DIAGNOSIS — I719 Aortic aneurysm of unspecified site, without rupture: Secondary | ICD-10-CM

## 2011-11-11 DIAGNOSIS — I10 Essential (primary) hypertension: Secondary | ICD-10-CM

## 2011-11-11 DIAGNOSIS — G4733 Obstructive sleep apnea (adult) (pediatric): Secondary | ICD-10-CM | POA: Insufficient documentation

## 2011-11-11 DIAGNOSIS — I77819 Aortic ectasia, unspecified site: Secondary | ICD-10-CM

## 2011-11-11 DIAGNOSIS — E669 Obesity, unspecified: Secondary | ICD-10-CM | POA: Insufficient documentation

## 2011-11-11 LAB — CBC WITH DIFFERENTIAL/PLATELET
Basophils Absolute: 0.1 10*3/uL (ref 0.0–0.1)
Basophils Relative: 1.1 % (ref 0.0–3.0)
Eosinophils Absolute: 0.3 10*3/uL (ref 0.0–0.7)
Eosinophils Relative: 2.9 % (ref 0.0–5.0)
HCT: 42.8 % (ref 39.0–52.0)
Hemoglobin: 14.7 g/dL (ref 13.0–17.0)
Lymphocytes Relative: 17 % (ref 12.0–46.0)
Lymphs Abs: 1.5 10*3/uL (ref 0.7–4.0)
MCHC: 34.4 g/dL (ref 30.0–36.0)
MCV: 87.2 fl (ref 78.0–100.0)
Monocytes Absolute: 0.8 10*3/uL (ref 0.1–1.0)
Monocytes Relative: 9.3 % (ref 3.0–12.0)
Neutro Abs: 6.2 10*3/uL (ref 1.4–7.7)
Neutrophils Relative %: 69.7 % (ref 43.0–77.0)
Platelets: 286 10*3/uL (ref 150.0–400.0)
RBC: 4.91 Mil/uL (ref 4.22–5.81)
RDW: 14 % (ref 11.5–14.6)
WBC: 8.8 10*3/uL (ref 4.5–10.5)

## 2011-11-11 LAB — BASIC METABOLIC PANEL
BUN: 14 mg/dL (ref 6–23)
CO2: 28 mEq/L (ref 19–32)
Calcium: 9.3 mg/dL (ref 8.4–10.5)
Chloride: 103 mEq/L (ref 96–112)
Creatinine, Ser: 0.9 mg/dL (ref 0.4–1.5)
GFR: 120.5 mL/min (ref >60.00)
Glucose, Bld: 96 mg/dL (ref 70–99)
Potassium: 3.6 mEq/L (ref 3.5–5.1)
Sodium: 140 mEq/L (ref 135–145)

## 2011-11-11 LAB — PROTIME-INR
INR: 1 ratio (ref 0.8–1.0)
Prothrombin Time: 11.5 s (ref 10.2–12.4)

## 2011-11-11 MED ORDER — METOPROLOL SUCCINATE ER 50 MG PO TB24: 50.0000 mg | ORAL_TABLET | Freq: Every day | ORAL | Status: DC

## 2011-11-11 MED ORDER — PREDNISONE 20 MG PO TABS: ORAL_TABLET | ORAL | Status: DC

## 2011-11-11 MED ORDER — SODIUM CHLORIDE 0.9 % IJ SOLN: 3.0000 mL | INTRAMUSCULAR | Status: DC | PRN

## 2011-11-11 MED ORDER — LISINOPRIL-HYDROCHLOROTHIAZIDE 20-25 MG PO TABS: 1.0000 | ORAL_TABLET | Freq: Every day | ORAL | Status: DC

## 2011-11-11 MED ORDER — SODIUM CHLORIDE 0.9 % IV SOLN: 250.0000 mL | INTRAVENOUS | Status: DC | PRN

## 2011-11-11 MED ORDER — SODIUM CHLORIDE 0.9 % IJ SOLN: 3.0000 mL | Freq: Two times a day (BID) | INTRAMUSCULAR | Status: DC

## 2011-11-11 NOTE — Patient Instructions (Signed)
Increase lisinopril/HCT to 20/25mg  daily--you can take two 10.12.5mg  tablets daily.  Increase Toprol XL to 50mg  daily--you can take two 25mg  Toprol XL tablets daily at the same time.  Dr Shirlee Latch has referred you to pulmonary sleep medicine for evaluation and management of sleep apnea and CPAP.  Your physician recommends that you have  lab work today--BMET/CBC/PT 441.9  401.9  Your physician has requested that you have a cardiac catheterization. Cardiac catheterization is used to diagnose and/or treat various heart conditions. Doctors may recommend this procedure for a number of different reasons. The most common reason is to evaluate chest pain. Chest pain can be a symptom of coronary artery disease (CAD), and cardiac catheterization can show whether plaque is narrowing or blocking your heart's arteries. This procedure is also used to evaluate the valves, as well as measure the blood flow and oxygen levels in different parts of your heart. For further information please visit [email or web address] Please follow instruction sheet, as given. Friday January 18,2013   Your physician recommends that you schedule a follow-up appointment in: about 2 weeks after the cath 11/14/11   Your physician recommends that you return for lab work when you see Dr Shirlee Latch in about 2 weeks--BMET 441.9  401.9

## 2011-11-11 NOTE — Assessment & Plan Note (Addendum)
Aortic root/proximal ascending aorta aneurysm.  By CTA chest, root/proximal ascending thoracic aorta measured 6.1 x 5.1 cm.  By echo today, root measured 5.4 cm with 5.5 cm proximal ascending aorta.  He is at the threshold where elective aortic root/ascending aorta graft repair could be considered.  He saw Dr. Tyrone Sage who offered a valve-sparing root and proximal ascending aorta replacement.  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  He plans to go forward with the surgery.  - I will arrange a left heart cath to rule out significant CAD prior to procedure. He will need premedication for possible IV contrast allergy.

## 2011-11-11 NOTE — Progress Notes (Signed)
PCP: Janace Hoard  51 yo with history of DM, hyperlipidemia, and HTN presented initially for evaluation of left arm numbness and tingling.  We did a myoview showing no ischemia but echo in 4/12 showed dilated aortic root and ascending aorta to about 5.4 cm.  Followup MRA chest in 4/12 showed 5.5 cm aortic root and 3.8 cm ascending aorta.  He was then seen by Dr. Tyrone Sage for evaluation of aneurysm in 10/12.  CT angiogram of the chest at that time showed 6.1 x 5.1 cm aortic root and proximal ascending aorta.  He discussed possible valve-sparing aortic root/ascending aorta repair with Dr. Tyrone Sage.  It was recommended that he has a pre-operative left heart cath.  He missed his initial followup with me.   He has been doing well symptomatically.  No exertional chest pain or dyspnea.  BP still runs high, up to 140s/90s when he has taken his medications.  It is 157/99 today but he has not taken his medications today.  He has OSA but is not using his CPAP because of problems with the machine.    Echo today was reviewed.  He has a trileaflet aortic valve with trivial AI.  EF was preserved.  The aortic root measured 5.4 cm with 5.5 cm ascending thoracic aorta.  This is similar to prior study.   Labs (3/12): K 4, creatinine 1.03, LDL 113, HDL 33 Labs (4/12): K 3.7, creatinine 0.8  PMH:  1. DM 2. Hyperlipidemia 3. HTN 4. Aortic root/ascending aortic aneurysm: 4/12 Echo with 5.4 cm aortic root; 4/12 MRA chest with 5.5 cm aortic root, 3.8 cm ascending thoracic aorta; 10/12 CTA chest with 6.1 x 5.1 cm aortic root/proximal ascending aorta, 4.2 x 4.1 cm mid ascending aorta;  1/13 Echo with 5.4 cm root, 5.5 cm proximal ascending thoracic aorta.  5. Myoview (4/12): EF 67%, no ischemia or infarction.  6. Echo (1/13): EF 55%, mild LVH, 5.4 cm aortic root, 5.5 cm proximal ascending thoracic aorta. Trileaflet aortic valve with trivial AI.  7. Possible contrast allergy  SH: Nonsmoker.  Married, lives in [place].   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    FH: Adopted, does not know any family medical history.    ROS: All systems reviewed and negative except as per HPI.   Current Outpatient Prescriptions  Medication Sig Dispense Refill  . acetaminophen (TYLENOL) 500 MG tablet Take 500 mg by mouth every 6 (six) hours as needed.      Marland Kitchen aspirin 81 MG tablet Take 81 mg by mouth daily.        . felodipine (PLENDIL) 10 MG 24 hr tablet Take 10 mg by mouth daily.        . metFORMIN (GLUCOPHAGE) 500 MG tablet Take 500 mg by mouth.       . pravastatin (PRAVACHOL) 20 MG tablet Take 20 mg by mouth daily.        Marland Kitchen DISCONTD: lisinopril-hydrochlorothiazide (PRINZIDE) 10-12.5 MG per tablet Take 1 tablet by mouth daily.        Marland Kitchen lisinopril-hydrochlorothiazide (PRINZIDE,ZESTORETIC) 20-25 MG per tablet Take 1 tablet by mouth daily.  30 tablet  6  . metoprolol succinate (TOPROL XL) 50 MG 24 hr tablet Take 1 tablet (50 mg total) by mouth daily. Take with or immediately following a meal.  30 tablet  6  . predniSONE (DELTASONE) 20 MG tablet Take 3 tablets at 11:30pm 11/13/11 and take 3 tablets at 10am 11/14/11  6 tablet  0  BP 157/99  Pulse 67  Ht 6\' 4"  (1.93 m)  Wt 142.429 kg (314 lb)  BMI 38.22 kg/m2 General: NAD, overweight.  Neck: No JVD, no thyromegaly or thyroid nodule.  Lungs: Clear to auscultation bilaterally with normal respiratory effort. CV: Nondisplaced PMI.  Heart regular S1/S2, no S3/S4, no murmur.  No peripheral edema.  No carotid bruit.  Normal pedal pulses.  Abdomen: Soft, nontender, no hepatosplenomegaly, no distention.  Neurologic: Alert and oriented x 3.  Psych: Normal affect. Extremities: No clubbing or cyanosis.  HEENT: Normal.

## 2011-11-11 NOTE — Assessment & Plan Note (Signed)
Patient needs good blood pressure control given aortic root/ascending aorta aneurysm.  BP is high today.  I am going to increase Toprol XL to 50 mg daily and lisinopril/HCT to 20/25 mg daily.  BMET in 2 weeks.

## 2011-11-11 NOTE — Assessment & Plan Note (Signed)
Not using CPAP, think it needs adjusting.  I will refer him to pulmonary.  CPAP could help with BP control.

## 2011-11-11 NOTE — Assessment & Plan Note (Signed)
Check lipids.  Goal LDL < 100 with diabetes.

## 2011-11-14 ENCOUNTER — Inpatient Hospital Stay (HOSPITAL_BASED_OUTPATIENT_CLINIC_OR_DEPARTMENT_OTHER)
Admission: RE | Admit: 2011-11-14 | Discharge: 2011-11-14 | Disposition: A | Discharge status: Hospice | Payer: BC Managed Care – PPO | Source: Ambulatory Visit | Attending: Cardiology | Admitting: Cardiology

## 2011-11-14 ENCOUNTER — Encounter (HOSPITAL_BASED_OUTPATIENT_CLINIC_OR_DEPARTMENT_OTHER)
Admission: RE | Disposition: A | Discharge status: Hospice | Payer: Self-pay | Source: Ambulatory Visit | Attending: Cardiology

## 2011-11-14 DIAGNOSIS — I2510 Atherosclerotic heart disease of native coronary artery without angina pectoris: Secondary | ICD-10-CM | POA: Insufficient documentation

## 2011-11-14 DIAGNOSIS — I359 Nonrheumatic aortic valve disorder, unspecified: Secondary | ICD-10-CM

## 2011-11-14 DIAGNOSIS — I712 Thoracic aortic aneurysm, without rupture, unspecified: Secondary | ICD-10-CM | POA: Insufficient documentation

## 2011-11-14 SURGERY — JV LEFT HEART CATHETERIZATION WITH CORONARY ANGIOGRAM
Anesthesia: Moderate Sedation

## 2011-11-14 MED ORDER — ASPIRIN 81 MG PO CHEW
324.0000 mg | CHEWABLE_TABLET | ORAL | Status: AC
  Administered 2011-11-14: 324 mg via ORAL

## 2011-11-14 MED ORDER — SODIUM CHLORIDE 0.9 % IV SOLN: INTRAVENOUS | Status: DC

## 2011-11-14 MED ORDER — ONDANSETRON HCL 4 MG/2ML IJ SOLN: 4.0000 mg | Freq: Four times a day (QID) | INTRAMUSCULAR | Status: DC | PRN

## 2011-11-14 MED ORDER — SODIUM CHLORIDE 0.9 % IV SOLN
INTRAVENOUS | Status: DC
  Administered 2011-11-14: 11:00:00 via INTRAVENOUS

## 2011-11-14 MED ORDER — ACETAMINOPHEN 325 MG PO TABS: 650.0000 mg | ORAL_TABLET | ORAL | Status: DC | PRN

## 2011-11-14 NOTE — Procedures (Addendum)
Cardiac Catheterization Procedure Note  Name: Jonathan Miles MRN: [id number] DOB: May 13, 1961  Procedure: Selective Coronary Angiography, Aortic root angiography  Indication: Aortic root aneurysm, pre-repair.    Procedural Details: Allen's testing on the right wrist was positive.  The right wrist was prepped, draped, and anesthetized with 1% lidocaine. Using the modified Seldinger technique, a 5 French sheath was introduced into the right radial artery. 3 mg of verapamil was administered through the sheath, weight-based unfractionated heparin was administered intravenously. Standard Judkins catheters were used for selective coronary angiography and aortic root angiography. Catheter exchanges were performed over an exchange length guidewire. There were no immediate procedural complications. A TR band was used for radial hemostasis at the completion of the procedure.  The patient was transferred to the post catheterization recovery area for further monitoring.  Procedural Findings: Hemodynamics: AO 122/84  Coronary angiography: Coronary dominance: right  Left mainstem: Very short vessel with trifurcation into LAD, ramus, circumflex.   Left anterior descending (LAD): Mild ectasia.  Diffuse mild luminal irregularities. Moderate 2nd diagonal with long 50% stenosis.   Left circumflex (LCx): Moderate ramus with luminal irregularities.  LCx is a large vessel with luminal irregularities.   Right coronary artery (RCA): Dominent vessel with ectasia.  60% proximal RCA stenosis at a bend in the vessel.  50% mid RCA stenosis.   Aortic root angiography: Enlarged aortic root and ascending aorta consistent with prior imaging.    Final Conclusions:  Ectatic, diffusely diseased coronaries.  Most significant disease was in the RCA where there was a 60% proximal vessel stenosis and a 50% mid vessel stenosis. There was also a long 50% stenosis in a moderate 2nd diagonal.   Recommendations:  Will discuss  with Dr. Tyrone Sage.  I do not think he has flow-limiting coronary disease but would consider grafting at least the RCA at time of aortic root repair.  Will need aggressive treatment for CAD risk.  Goal LDL < 70.   [name] 11/14/2011, 12:20 PM

## 2011-11-14 NOTE — Interval H&P Note (Signed)
History and Physical Interval Note:  11/14/2011 11:37 AM  Jonathan Miles  has presented today for surgery, with the diagnosis of pre-ascending aorta replacement surgery.  The various methods of treatment have been discussed with the patient and family. After consideration of risks, benefits and other options for treatment, the patient has consented to  Procedure(s): JV LEFT HEART CATHETERIZATION WITH CORONARY ANGIOGRAM as a surgical intervention .  The patients' history has been reviewed, patient examined, no change in status, stable for surgery.  I have reviewed the patients' chart and labs.  Questions were answered to the patient's satisfaction.     Dalton [employer]

## 2011-11-14 NOTE — H&P (View-Only) (Signed)
PCP: Jonathan Miles  50 yo with history of DM, hyperlipidemia, and HTN presented initially for evaluation of left arm numbness and tingling.  We did a myoview showing no ischemia but echo in 4/12 showed dilated aortic root and ascending aorta to about 5.4 cm.  Followup MRA chest in 4/12 showed 5.5 cm aortic root and 3.8 cm ascending aorta.  He was then seen by Dr. Gerhardt for evaluation of aneurysm in 10/12.  CT angiogram of the chest at that time showed 6.1 x 5.1 cm aortic root and proximal ascending aorta.  He discussed possible valve-sparing aortic root/ascending aorta repair with Dr. Gerhardt.  It was recommended that he has a pre-operative left heart cath.  He missed his initial followup with me.   He has been doing well symptomatically.  No exertional chest pain or dyspnea.  BP still runs high, up to 140s/90s when he has taken his medications.  It is 157/99 today but he has not taken his medications today.  He has OSA but is not using his CPAP because of problems with the machine.    Echo today was reviewed.  He has a trileaflet aortic valve with trivial AI.  EF was preserved.  The aortic root measured 5.4 cm with 5.5 cm ascending thoracic aorta.  This is similar to prior study.   Labs (3/12): K 4, creatinine 1.03, LDL 113, HDL 33 Labs (4/12): K 3.7, creatinine 0.8  PMH:  1. DM 2. Hyperlipidemia 3. HTN 4. Aortic root/ascending aortic aneurysm: 4/12 Echo with 5.4 cm aortic root; 4/12 MRA chest with 5.5 cm aortic root, 3.8 cm ascending thoracic aorta; 10/12 CTA chest with 6.1 x 5.1 cm aortic root/proximal ascending aorta, 4.2 x 4.1 cm mid ascending aorta;  1/13 Echo with 5.4 cm root, 5.5 cm proximal ascending thoracic aorta.  5. Myoview (4/12): EF 67%, no ischemia or infarction.  6. Echo (1/13): EF 55%, mild LVH, 5.4 cm aortic root, 5.5 cm proximal ascending thoracic aorta. Trileaflet aortic valve with trivial AI.  7. Possible contrast allergy  SH: Nonsmoker.  Married, lives in [place].   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    FH: Adopted, does not know any family medical history.    ROS: All systems reviewed and negative except as per HPI.   Current Outpatient Prescriptions  Medication Sig Dispense Refill  . acetaminophen (TYLENOL) 500 MG tablet Take 500 mg by mouth every 6 (six) hours as needed.      . aspirin 81 MG tablet Take 81 mg by mouth daily.        . felodipine (PLENDIL) 10 MG 24 hr tablet Take 10 mg by mouth daily.        . metFORMIN (GLUCOPHAGE) 500 MG tablet Take 500 mg by mouth.       . pravastatin (PRAVACHOL) 20 MG tablet Take 20 mg by mouth daily.        . DISCONTD: lisinopril-hydrochlorothiazide (PRINZIDE) 10-12.5 MG per tablet Take 1 tablet by mouth daily.        . lisinopril-hydrochlorothiazide (PRINZIDE,ZESTORETIC) 20-25 MG per tablet Take 1 tablet by mouth daily.  30 tablet  6  . metoprolol succinate (TOPROL XL) 50 MG 24 hr tablet Take 1 tablet (50 mg total) by mouth daily. Take with or immediately following a meal.  30 tablet  6  . predniSONE (DELTASONE) 20 MG tablet Take 3 tablets at 11:30pm 11/13/11 and take 3 tablets at 10am 11/14/11  6 tablet  0      BP 157/99  Pulse 67  Ht 6' 4" (1.93 m)  Wt 142.429 kg (314 lb)  BMI 38.22 kg/m2 General: NAD, overweight.  Neck: No JVD, no thyromegaly or thyroid nodule.  Lungs: Clear to auscultation bilaterally with normal respiratory effort. CV: Nondisplaced PMI.  Heart regular S1/S2, no S3/S4, no murmur.  No peripheral edema.  No carotid bruit.  Normal pedal pulses.  Abdomen: Soft, nontender, no hepatosplenomegaly, no distention.  Neurologic: Alert and oriented x 3.  Psych: Normal affect. Extremities: No clubbing or cyanosis.  HEENT: Normal.  

## 2011-11-14 NOTE — OR Nursing (Signed)
Discharge instructions reviewed and signed, pt stated understanding, ambulated in hall without difficulty, transported to wife's car via wheelchair  

## 2011-11-18 LAB — POCT I-STAT GLUCOSE
Glucose, Bld: 133 mg/dL — ABNORMAL HIGH (ref 70–99)
Operator id: 194801

## 2011-11-25 ENCOUNTER — Encounter: Payer: Self-pay | Admitting: Cardiology

## 2011-11-25 ENCOUNTER — Ambulatory Visit (INDEPENDENT_AMBULATORY_CARE_PROVIDER_SITE_OTHER): Payer: BC Managed Care – PPO | Admitting: Cardiology

## 2011-11-25 ENCOUNTER — Telehealth: Payer: Self-pay | Admitting: Cardiology

## 2011-11-25 VITALS — BP 130/92 | HR 72 | Ht 76.0 in | Wt 311.0 lb

## 2011-11-25 DIAGNOSIS — I77819 Aortic ectasia, unspecified site: Secondary | ICD-10-CM

## 2011-11-25 DIAGNOSIS — I77810 Thoracic aortic ectasia: Secondary | ICD-10-CM

## 2011-11-25 DIAGNOSIS — I2510 Atherosclerotic heart disease of native coronary artery without angina pectoris: Secondary | ICD-10-CM

## 2011-11-25 DIAGNOSIS — E785 Hyperlipidemia, unspecified: Secondary | ICD-10-CM

## 2011-11-25 DIAGNOSIS — G4733 Obstructive sleep apnea (adult) (pediatric): Secondary | ICD-10-CM

## 2011-11-25 DIAGNOSIS — I10 Essential (primary) hypertension: Secondary | ICD-10-CM

## 2011-11-25 NOTE — Telephone Encounter (Signed)
New Problem   Patient calling back after appnt with Dr. Jearld Pies today.  Noticed his paperwork states he is allergic to ace inhibitors, which is incorrect.  Patient requests return call ASAP on mobile #[phone]

## 2011-11-25 NOTE — Patient Instructions (Signed)
Your physician recommends that you have lab today--lipid profile/liver profile   Take and record your blood pressure. I will call you in 2 weeks to get the readings.  Your physician recommends that you schedule a follow-up appointment in: 3 months with Dr Shirlee Latch.

## 2011-11-25 NOTE — Telephone Encounter (Signed)
I talked with pt. Pt states ACE inhibitors marked as allergy. Pt states he takes lisinopril without problems. I have removed it from his allergy list

## 2011-11-26 LAB — HEPATIC FUNCTION PANEL
ALT: 21 U/L (ref 0–53)
AST: 14 U/L (ref 0–37)
Albumin: 4.1 g/dL (ref 3.5–5.2)
Alkaline Phosphatase: 61 U/L (ref 39–117)
Bilirubin, Direct: 0.1 mg/dL (ref 0.0–0.3)
Total Bilirubin: 0.6 mg/dL (ref 0.3–1.2)
Total Protein: 7.1 g/dL (ref 6.0–8.3)

## 2011-11-26 LAB — LIPID PANEL
Cholesterol: 197 mg/dL (ref 0–200)
HDL: 42.2 mg/dL (ref >39.00)
LDL Cholesterol: 133 mg/dL — ABNORMAL HIGH (ref 0–99)
Total CHOL/HDL Ratio: 5
Triglycerides: 111 mg/dL (ref 0.0–149.0)
VLDL: 22.2 mg/dL (ref 0.0–40.0)

## 2011-11-27 DIAGNOSIS — I2510 Atherosclerotic heart disease of native coronary artery without angina pectoris: Secondary | ICD-10-CM | POA: Insufficient documentation

## 2011-11-27 NOTE — Assessment & Plan Note (Signed)
Moderate coronary disease.  No flow-limiting obstructions on cath but significant disease in RCA.  Would probably graft RCA at time of aortic surgery. Continue ASA 81, Toprol, ACEI, statin.  Goal LDL < 70.

## 2011-11-27 NOTE — Assessment & Plan Note (Signed)
Not using CPAP, think it needs adjusting.  I referred him to pulmonary.  CPAP could help with BP control.

## 2011-11-27 NOTE — Assessment & Plan Note (Signed)
BP better when he checks at home with advancement of his medical regimen.  Will have him record BP and will call in 2 wks for numbers at home.  Goal with aneurysm will be < 130/80.

## 2011-11-27 NOTE — Progress Notes (Signed)
PCP: Janace Hoard  51 yo with history of DM, hyperlipidemia, and HTN presented initially for evaluation of left arm numbness and tingling.  We did a myoview showing no ischemia but echo in 4/12 showed dilated aortic root and ascending aorta to about 5.4 cm.  Followup MRA chest in 4/12 showed 5.5 cm aortic root and 3.8 cm ascending aorta.  He was then seen by Dr. Tyrone Sage for evaluation of aneurysm in 10/12.  CT angiogram of the chest at that time showed 6.1 x 5.1 cm aortic root and proximal ascending aorta.  He discussed possible valve-sparing aortic root/ascending aorta repair with Dr. Tyrone Sage.  Left heart cath was done recently, showing a long 50% D2 stenosis, a 60% proximal RCA stenosis, and a 50% mid RCA stenosis.    He has been doing well symptomatically.  No exertional chest pain or dyspnea.  Blood pressure has been coming down.  He has OSA but is not using his CPAP because of problems with the machine.    Labs (3/12): K 4, creatinine 1.03, LDL 113, HDL 33 Labs (4/12): K 3.7, creatinine 0.8 Labs (1/13): K 3.6, creatinine 0.9  PMH:  1. DM 2. Hyperlipidemia 3. HTN 4. Aortic root/ascending aortic aneurysm: 4/12 Echo with 5.4 cm aortic root; 4/12 MRA chest with 5.5 cm aortic root, 3.8 cm ascending thoracic aorta; 10/12 CTA chest with 6.1 x 5.1 cm aortic root/proximal ascending aorta, 4.2 x 4.1 cm mid ascending aorta;  1/13 Echo with 5.4 cm root, 5.5 cm proximal ascending thoracic aorta.  5. Myoview (4/12): EF 67%, no ischemia or infarction.  6. Echo (1/13): EF 55%, mild LVH, 5.4 cm aortic root, 5.5 cm proximal ascending thoracic aorta. Trileaflet aortic valve with trivial AI.  7. Possible contrast allergy 8. CAD: LHC (1/13) with long 50% D2 stenosis, 60% proximal RCA, and 50% mid RCA.    SH: Nonsmoker.  Married, lives in [place].  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    FH: Adopted, does not know any family medical history.    ROS: All systems reviewed  and negative except as per HPI.   Current Outpatient Prescriptions  Medication Sig Dispense Refill  . acetaminophen (TYLENOL) 500 MG tablet Take 500 mg by mouth every 6 (six) hours as needed.      Marland Kitchen aspirin 81 MG tablet Take 81 mg by mouth daily.        . felodipine (PLENDIL) 10 MG 24 hr tablet Take 10 mg by mouth daily.        Marland Kitchen lisinopril (PRINIVIL,ZESTRIL) 20 MG tablet Take 20 mg by mouth at bedtime.      Marland Kitchen lisinopril-hydrochlorothiazide (PRINZIDE,ZESTORETIC) 20-25 MG per tablet Take 1 tablet by mouth daily.  30 tablet  6  . metFORMIN (GLUCOPHAGE) 500 MG tablet Take 500 mg by mouth.       . metoprolol succinate (TOPROL XL) 50 MG 24 hr tablet Take 1 tablet (50 mg total) by mouth daily. Take with or immediately following a meal.  30 tablet  6  . pravastatin (PRAVACHOL) 20 MG tablet Take 20 mg by mouth daily.         No current facility-administered medications for this visit.   Facility-Administered Medications Ordered in Other Visits  Medication Dose Route Frequency Provider Last Rate Last Dose  . 0.9 %  sodium chloride infusion  250 mL Intravenous PRN [name], MD      . sodium chloride 0.9 % injection 3 mL  3 mL Intravenous  Q12H [name], MD      . sodium chloride 0.9 % injection 3 mL  3 mL Intravenous PRN [name], MD        BP 130/92  Pulse 72  Ht 6\' 4"  (1.93 m)  Wt 141.069 kg (311 lb)  BMI 37.86 kg/m2 General: NAD, overweight.  Neck: Thick, no JVD, no thyromegaly or thyroid nodule.  Lungs: Clear to auscultation bilaterally with normal respiratory effort. CV: Nondisplaced PMI.  Heart regular S1/S2, no S3/S4, no murmur.  No peripheral edema.  No carotid bruit.  Normal pedal pulses.  Abdomen: Soft, nontender, no hepatosplenomegaly, no distention.  Neurologic: Alert and oriented x 3.  Psych: Normal affect. Extremities: No clubbing or cyanosis.  HEENT: Normal.

## 2011-11-27 NOTE — Assessment & Plan Note (Signed)
CAD noted on cath.  Goal LDL will be < 70.  Will check lipids on pravastatin.

## 2011-11-27 NOTE — Assessment & Plan Note (Signed)
Aortic root/proximal ascending aorta aneurysm.  By CTA chest, root/proximal ascending thoracic aorta measured 6.1 x 5.1 cm.  By echo this month, root measured 5.4 cm with 5.5 cm proximal ascending aorta.  He is at the threshold where elective aortic root/ascending aorta graft repair could be considered.  He saw Dr. Tyrone Sage who offered a valve-sparing root and proximal ascending aorta replacement.  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  He plans to go forward with the surgery.  I think this is reasonable.  He is a large man so aortic size would be expected to run at the upper end of the bell curve, but 6.1 cm is certainly worrisome for dissection risk.

## 2011-11-28 ENCOUNTER — Encounter: Payer: Self-pay | Admitting: Pulmonary Disease

## 2011-11-28 ENCOUNTER — Ambulatory Visit (INDEPENDENT_AMBULATORY_CARE_PROVIDER_SITE_OTHER): Payer: BC Managed Care – PPO | Admitting: Pulmonary Disease

## 2011-11-28 VITALS — BP 142/88 | HR 67 | Temp 98.8°F | Ht 76.0 in | Wt 313.4 lb

## 2011-11-28 DIAGNOSIS — G4733 Obstructive sleep apnea (adult) (pediatric): Secondary | ICD-10-CM

## 2011-11-28 NOTE — Assessment & Plan Note (Signed)
The patient has a history of mild to moderate obstructive sleep apnea in the past, and has gained weight since, making it likely that his sleep apnea has worsened.  He has been noncompliant with CPAP, but is very interested in restarting to improve his quality of life.  He is also concerned about its impact to his cardiovascular health.  He is not been able to tolerate a full face mask, and I am willing to give him a trial of a nasal mask or nasal pillows along with a chin strap.  He is unsure if the pressure delivery was an issue for him, and therefore I would like to take the next few weeks to use an auto set device to optimize his pressure again and see if he tolerates this better.  I have also encouraged the patient work aggressively on weight loss.

## 2011-11-28 NOTE — Progress Notes (Signed)
Subjective:    Patient ID: Jonathan Miles, male    DOB: [birth year], 51 y.o.   MRN: [id number]  HPI The patient is a 51 year old male who I've been asked to see for management of obstructive sleep apnea.  He was diagnosed with mild to moderate sleep apnea in 2006, with an AHI of 16 events per hour.  His CPAP pressure was optimized to 10 cm.  The patient wore CPAP consistently for the first 2-3 months, and then began to have issues with noncompliance.  He has really worn CPAP very little from 6 months after he received the device.  He recently became concerned about his overall health, and started using the CPAP again.  He again has been having issues with tolerating a full face mask, and sometimes will awaken during the night and pulled the mask off.  He doesn't think he has a pressure tolerance issue, but is unsure.  When the patient does not wear CPAP, he is noted to have loud snoring, as well as an abnormal breathing pattern during sleep.  He has frequent awakenings at night, is not rested in the mornings upon arising.  He notes significant sleep pressure during the day with periods of inactivity.  The patient states that his weight is up 13 pounds from his sleep study in 2006, and his Epworth score today is abnormal at 13.  Sleep Questionnaire: What time do you typically go to bed?( Between what hours) 10:30 pm- 12:00 How long does it take you to fall asleep? less than 60 mins How many times during the night do you wake up? 3 What time do you get out of bed to start your day? 0630 Do you drive or operate heavy machinery in your occupation? Yes How much has your weight changed (up or down) over the past two years? (In pounds) 25 lb (11.34 kg) Have you ever had a sleep study before? Yes If yes, location of study? across the street from [facility]? If yes, date of study? approx 2008 Do you currently use CPAP? Yes If so, what pressure? 11-12? Do you wear oxygen at any time? No    Review of Systems    Constitutional: Negative for fever and unexpected weight change.  HENT: Positive for dental problem. Negative for ear pain, nosebleeds, congestion, sore throat, rhinorrhea, sneezing, trouble swallowing, postnasal drip and sinus pressure.   Eyes: Negative for redness and itching.  Respiratory: Negative for cough, chest tightness, shortness of breath and wheezing.   Cardiovascular: Negative for palpitations and leg swelling.  Gastrointestinal: Negative for nausea and vomiting.  Genitourinary: Negative for dysuria.  Musculoskeletal: Positive for joint swelling.  Skin: Negative for rash.  Neurological: Negative for headaches.  Hematological: Does not bruise/bleed easily.  Psychiatric/Behavioral: Negative for dysphoric mood. The patient is not nervous/anxious.        Objective:   Physical Exam Constitutional:  Overweight male, no acute distress  HENT:  Nares patent without discharge  Oropharynx without exudate, palate and uvula are mildly elongated  Eyes:  Perrla, eomi, no scleral icterus  Neck:  No JVD, no TMG  Cardiovascular:  Normal rate, regular rhythm, no rubs or gallops.  1/6 sem        Intact distal pulses  Pulmonary :  Normal breath sounds, no stridor or respiratory distress   No rales, rhonchi, or wheezing  Abdominal:  Soft, nondistended, bowel sounds present.  No tenderness noted.   Musculoskeletal:  1+ lower extremity edema noted.  Lymph Nodes:  No  cervical lymphadenopathy noted  Skin:  No cyanosis noted  Neurologic:  Appears sleepy, moves all 4 extremities without obvious deficit.         Assessment & Plan:

## 2011-11-28 NOTE — Patient Instructions (Signed)
Will have [facility] check your current machine to make sure in working order. Will have [facility] get you an Automatic cpap loaner for 2 weeks, to see how you tolerate and to re-calibrate your cpap pressure Please call me if you are having tolerance issues.  I will call you once I get your download report.   Work on [employer] loss Will schedule followup visit once I see your download.

## 2011-12-03 ENCOUNTER — Telehealth: Payer: Self-pay | Admitting: *Deleted

## 2011-12-03 DIAGNOSIS — I2510 Atherosclerotic heart disease of native coronary artery without angina pectoris: Secondary | ICD-10-CM

## 2011-12-03 DIAGNOSIS — E785 Hyperlipidemia, unspecified: Secondary | ICD-10-CM

## 2011-12-03 MED ORDER — SIMVASTATIN 40 MG PO TABS: 40.0000 mg | ORAL_TABLET | Freq: Every evening | ORAL | Status: DC

## 2011-12-03 NOTE — Telephone Encounter (Signed)
Notes Recorded by Jacqlyn Krauss, RN on 12/03/2011 at 11:08 AM Discussed with pt. Pt has taken simvastatin in the past without problems. He thinks it was changed because he did not get good lipid results with it.He is agreeable to changing to simvastatin from pravachol. He just refilled pravachol and would like to finish out this current prescription before changing over to simvastatin. He will start simvastatin 40mg  daily when he runs out of his current pravastatin prescription. Pt will have fasting lipid/liver profile at the time of his office visit with Dr Shirlee Latch 02/23/12. (Pt states he has a $40 co-pay each time he comes here, even for lab, and it is getting expensive). Discussed low fat diet with pt. Discussed using coenzyme Q10 200mg  daily with simvastatin. Pt states he had significant leg pain with Crestor and Lipitor. ------  Notes Recorded by [name], MD on 12/02/2011 at 12:18 PM Has he been able to take simvastatin? Could try simvastatin 40 mg daily with coenzyme Q10 200 mg daily. ------  Notes Recorded by Jacqlyn Krauss, RN on 12/01/2011 at 8:42 AM I talked with pt. Pt states he has not been able to tolerate either Lipitor or Crestor in the past. I will review with Dr Shirlee Latch. ------  Notes Recorded by [name], MD on 11/27/2011 at 10:50 AM LDL much too high on pravastatin. Would stop this and start atorvastatin 20 mg daily. Lipids/LFTs in 2 months.

## 2011-12-09 ENCOUNTER — Telehealth: Payer: Self-pay | Admitting: *Deleted

## 2011-12-09 NOTE — Telephone Encounter (Signed)
Hypertension - [name], MD 11/27/2011 10:44 AM Signed  BP better when he checks at home with advancement of his medical regimen. Will have him record BP and will call in 2 wks for numbers at home. Goal with aneurysm will be < 130/80.   12/09/11 --I talked with pt. Pt states he needs a battery for his BP machine. He has not checked his BP regularly. He will get a battery and will check his BP every day or every other day for 2 weeks. He will call me in 2 weeks with the readings.

## 2011-12-26 ENCOUNTER — Other Ambulatory Visit: Payer: Self-pay | Admitting: Pulmonary Disease

## 2011-12-26 ENCOUNTER — Telehealth: Payer: Self-pay | Admitting: Pulmonary Disease

## 2011-12-26 DIAGNOSIS — G4733 Obstructive sleep apnea (adult) (pediatric): Secondary | ICD-10-CM

## 2011-12-26 NOTE — Telephone Encounter (Signed)
Called, spoke with pt.  He did autoset for 2 wks.  States he turned the machine into [facility] 1 wk ago.   Would like results.  Dr. Shelle Iron, pls advise if you have seen these results.  Thanks!  Pt ok with call back on Monday regarding this.

## 2011-12-26 NOTE — Telephone Encounter (Signed)
See if we can track down and I will be happy to review.

## 2011-12-26 NOTE — Telephone Encounter (Signed)
Aundra Millet, will you please try to get these results for Dr. Shelle Iron to review?

## 2011-12-26 NOTE — Telephone Encounter (Signed)
Results in [facility] VIP folder

## 2011-12-26 NOTE — Telephone Encounter (Signed)
Order sent to [facility] to put cpap on 13cm.

## 2011-12-29 NOTE — Telephone Encounter (Signed)
Pt aware. Lyndel Sarate, CMA  

## 2012-02-23 ENCOUNTER — Ambulatory Visit (INDEPENDENT_AMBULATORY_CARE_PROVIDER_SITE_OTHER): Payer: BC Managed Care – PPO | Admitting: Cardiology

## 2012-02-23 ENCOUNTER — Encounter: Payer: Self-pay | Admitting: Cardiology

## 2012-02-23 ENCOUNTER — Other Ambulatory Visit: Payer: BC Managed Care – PPO

## 2012-02-23 VITALS — BP 136/90 | HR 78 | Ht 76.0 in | Wt 320.0 lb

## 2012-02-23 DIAGNOSIS — I2510 Atherosclerotic heart disease of native coronary artery without angina pectoris: Secondary | ICD-10-CM

## 2012-02-23 DIAGNOSIS — I712 Thoracic aortic aneurysm, without rupture: Secondary | ICD-10-CM

## 2012-02-23 DIAGNOSIS — G4733 Obstructive sleep apnea (adult) (pediatric): Secondary | ICD-10-CM

## 2012-02-23 MED ORDER — METOPROLOL SUCCINATE ER 100 MG PO TB24: 100.0000 mg | ORAL_TABLET | Freq: Every day | ORAL | Status: DC

## 2012-02-23 NOTE — Progress Notes (Signed)
PCP: Janace Hoard  51 yo with history of DM, hyperlipidemia, and HTN presented initially for evaluation of left arm numbness and tingling.  We did a myoview showing no ischemia but echo in 4/12 showed dilated aortic root and ascending aorta to about 5.4 cm.  Followup MRA chest in 4/12 showed 5.5 cm aortic root and 3.8 cm ascending aorta.  He was then seen by Dr. Tyrone Sage for evaluation of aneurysm in 10/12.  CT angiogram of the chest at that time showed 6.1 x 5.1 cm aortic root and proximal ascending aorta.  He discussed possible valve-sparing aortic root/ascending aorta repair with Dr. Tyrone Sage.  Left heart cath was done in 1/13, showing a long 50% D2 stenosis, a 60% proximal RCA stenosis, and a 50% mid RCA stenosis.    He has been doing well symptomatically.  No exertional chest pain or dyspnea.  Diastolic BP still occasionally gets up to around 90.  He is trying to use his CPAP but it is still difficult.  He has not seen Dr. Tyrone Sage since his cath.   Labs (3/12): K 4, creatinine 1.03, LDL 113, HDL 33 Labs (4/12): K 3.7, creatinine 0.8 Labs (1/13): K 3.6, creatinine 0.9, LDL 133, HDL 42  PMH:  1. DM 2. Hyperlipidemia 3. HTN 4. Aortic root/ascending aortic aneurysm: 4/12 Echo with 5.4 cm aortic root; 4/12 MRA chest with 5.5 cm aortic root, 3.8 cm ascending thoracic aorta; 10/12 CTA chest with 6.1 x 5.1 cm aortic root/proximal ascending aorta, 4.2 x 4.1 cm mid ascending aorta;  1/13 Echo with 5.4 cm root, 5.5 cm proximal ascending thoracic aorta.  5. Myoview (4/12): EF 67%, no ischemia or infarction.  6. Echo (1/13): EF 55%, mild LVH, 5.4 cm aortic root, 5.5 cm proximal ascending thoracic aorta. Trileaflet aortic valve with trivial AI.  7. Possible contrast allergy 8. CAD: LHC (1/13) with long 50% D2 stenosis, 60% proximal RCA, and 50% mid RCA.    SH: Nonsmoker.  Married, lives in [place].  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    FH: Adopted, does not know  any family medical history.    ROS: All systems reviewed and negative except as per HPI.   Current Outpatient Prescriptions  Medication Sig Dispense Refill  . acetaminophen (TYLENOL) 500 MG tablet Take 500 mg by mouth every 6 (six) hours as needed.      Marland Kitchen aspirin 81 MG tablet Take 81 mg by mouth daily.        . felodipine (PLENDIL) 10 MG 24 hr tablet Take 10 mg by mouth daily.        Marland Kitchen lisinopril (PRINIVIL,ZESTRIL) 20 MG tablet Take 20 mg by mouth at bedtime.      Marland Kitchen lisinopril-hydrochlorothiazide (PRINZIDE,ZESTORETIC) 20-25 MG per tablet Take 1 tablet by mouth daily.  30 tablet  6  . metFORMIN (GLUCOPHAGE) 500 MG tablet Take 500 mg by mouth.       . metoprolol succinate (TOPROL XL) 50 MG 24 hr tablet Take 1 tablet (50 mg total) by mouth daily. Take with or immediately following a meal.  30 tablet  6  . simvastatin (ZOCOR) 40 MG tablet Take 1 tablet (40 mg total) by mouth every evening.  30 tablet  6   No current facility-administered medications for this visit.   Facility-Administered Medications Ordered in Other Visits  Medication Dose Route Frequency Provider Last Rate Last Dose  . 0.9 %  sodium chloride infusion  250 mL Intravenous PRN Laurey Morale,  MD      . sodium chloride 0.9 % injection 3 mL  3 mL Intravenous Q12H Laurey Morale, MD      . sodium chloride 0.9 % injection 3 mL  3 mL Intravenous PRN Laurey Morale, MD        BP 136/90  Pulse 78  Ht 6\' 4"  (1.93 m)  Wt 320 lb (145.151 kg)  BMI 38.95 kg/m2 General: NAD, overweight.  Neck: Thick, no JVD, no thyromegaly or thyroid nodule.  Lungs: Clear to auscultation bilaterally with normal respiratory effort. CV: Nondisplaced PMI.  Heart regular S1/S2, no S3/S4, no murmur.  No peripheral edema.  No carotid bruit.  Normal pedal pulses.  Abdomen: Soft, nontender, no hepatosplenomegaly, no distention.  Neurologic: Alert and oriented x 3.  Psych: Normal affect. Extremities: No clubbing or cyanosis.  HEENT: Normal.

## 2012-02-23 NOTE — Assessment & Plan Note (Signed)
He has just started simvastatin. Will check lipids/LFTs in 6/13 with goal LDL < 70.

## 2012-02-23 NOTE — Assessment & Plan Note (Signed)
BP still runs higher than I would like.  Increase Toprol XL to 100 mg daily.

## 2012-02-23 NOTE — Assessment & Plan Note (Signed)
He is doing better with CPAP.

## 2012-02-23 NOTE — Assessment & Plan Note (Signed)
Aortic root/proximal ascending aorta aneurysm.  By CTA chest, root/proximal ascending thoracic aorta measured 6.1 x 5.1 cm.  By echo in 1/13, root measured 5.4 cm with 5.5 cm proximal ascending aorta.  He is at the threshold where elective aortic root/ascending aorta graft repair could be considered.  He saw Dr. Tyrone Sage who offered a valve-sparing root and proximal ascending aorta replacement.  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  He is a large man so aortic size would be expected to run at the upper end of the bell curve, but 6.1 cm is certainly worrisome for dissection risk.  We completed his cardiac cath in 1/13 for pre-operative purposes but he has not been back to Dr. Tyrone Sage yet.  We discussed surgery today, and he says he is ready for the elective aortic repair.  I am going to have him see Dr. Tyrone Sage in the next week or two.

## 2012-02-23 NOTE — Patient Instructions (Signed)
Increase Toprol XL(metoprolol succinate) to 100mg  daily. You can take two 50mg  tablets daily at the same time.  Schedule an appointment with Dr Tyrone Sage in 1-2 weeks.  Your physician recommends that you return for a FASTING lipid profile /liver profile in June 2013.  Your physician recommends that you schedule a follow-up appointment in: 4 months with Dr Shirlee Latch.

## 2012-02-23 NOTE — Assessment & Plan Note (Signed)
Moderate coronary disease.  No flow-limiting obstructions on cath but significant disease in RCA.  Would graft RCA at time of aortic surgery. Continue ASA 81, Toprol, ACEI, statin.  Goal LDL < 70.

## 2012-02-24 ENCOUNTER — Telehealth: Payer: Self-pay | Admitting: Cardiology

## 2012-02-24 NOTE — Telephone Encounter (Signed)
Spoke with pt

## 2012-02-24 NOTE — Telephone Encounter (Signed)
Patient request return call at [phone]  Patient has questions about his visit, there seems to be some discrepancies in the check out paperwork.   He can be reached at [phone]

## 2012-03-04 ENCOUNTER — Ambulatory Visit (INDEPENDENT_AMBULATORY_CARE_PROVIDER_SITE_OTHER): Payer: BC Managed Care – PPO | Admitting: Cardiothoracic Surgery

## 2012-03-04 ENCOUNTER — Encounter: Payer: Self-pay | Admitting: Cardiothoracic Surgery

## 2012-03-04 VITALS — BP 147/97 | HR 72 | Resp 20 | Ht 76.0 in | Wt 325.0 lb

## 2012-03-04 DIAGNOSIS — I712 Thoracic aortic aneurysm, without rupture: Secondary | ICD-10-CM

## 2012-03-09 ENCOUNTER — Encounter: Payer: Self-pay | Admitting: Cardiothoracic Surgery

## 2012-03-09 NOTE — Progress Notes (Signed)
[address]            [place] [address]          [phone]      ULYS FAVIA [facility] Medical Record #[id number] Date of Birth: 28-May-1961  Referring: Laurey Morale, MD Primary Care: Janace Hoard, MD, MD  Chief Complaint:    Chief Complaint  Patient presents with  . Thoracic Aortic Aneurysm    Further discuss surgery, S/P cardiac cath    History of Present Illness:     Patient returns today for followup visit he was originally seen 05/29/2011. He was initially referred because of intermittent numbness in his left arm and was referred for stress echo by his primary physician. Echocardiogram showed a dilated aortic root so ultimately he had a Cardiolite nuclear tests that showed no evidence of ischemia. MRI suggested a dilatation of his aortic root he had no evidence of aortic insufficiency. He does not know his family history as he is adopted. He notes since last seen he does occasionally have intermittent tingling in his left arm. He denies any chest pain shortness of breath or other symptoms of congestive heart failure.       Current Activity/ Functional Status: Patient is independent with mobility/ambulation, transfers, ADL's, IADL's.   Past Medical History  Diagnosis Date  . Hypertension   . Hyperlipidemia   . Diabetes mellitus     type 2  . OSA (obstructive sleep apnea)     does not use his C-Pap  . Chest pain     Myoview 4/12: EF 62%, no ischemia or scar  . Aortic root dilatation     a. echo 4/12: EF 55-60%, mod to marked dilated ascending aortic root;   b. MRA 4/12: Aortic root 5.5 cm, dilation of left and right coronary cusps, trileaflet aortic valve    Past Surgical History  Procedure Date  . Knee arthroscopy 2010    left    Family History  Problem Relation Age of Onset  . Adopted: Yes    History   Social History  . Marital Status: Married    Spouse Name: N/A    Number of Children: 0  . Years of  Education: N/A   Occupational History  . 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 Smoking status: Former Smoker -- 1.0 packs/day for 13 years    Types: Cigarettes    Quit date: 01/23/1988  . Smokeless tobacco: Never Used  . Alcohol Use: Not on file  . Drug Use: Not on file  . Sexually Active: Not on file   Other Topics Concern  . Not on file   Social History Narrative   Married, lives in [place]. 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 Pt is adopted. Unsure of his family medical history.    History  Smoking status  . Former Smoker -- 1.0 packs/day for 13 years  . Types: Cigarettes  . Quit date: 01/23/1988  Smokeless tobacco  . Never Used    History  Alcohol Use: Not on file     Allergies  Allergen Reactions  . Iodinated Diagnostic Agents Hives    Hives started 4 days after cta chest was performed, minimal relief w/ benadryl x 3 days, uncertain if reaction was actually iv contrast or other allergen,allergy tests to follow.wife will make [place] aware of results//a.calhoun  Current Outpatient Prescriptions  Medication Sig Dispense Refill  . acetaminophen (TYLENOL) 500 MG tablet Take 500 mg by mouth every 6 (six) hours as needed.      Marland Kitchen aspirin 81 MG tablet Take 81 mg by mouth daily.        . felodipine (PLENDIL) 10 MG 24 hr tablet Take 10 mg by mouth daily.        Marland Kitchen lisinopril (PRINIVIL,ZESTRIL) 20 MG tablet Take 20 mg by mouth at bedtime.      Marland Kitchen lisinopril-hydrochlorothiazide (PRINZIDE,ZESTORETIC) 20-25 MG per tablet Take 1 tablet by mouth daily.  30 tablet  6  . metFORMIN (GLUCOPHAGE) 500 MG tablet Take 500 mg by mouth.       . metoprolol succinate (TOPROL XL) 100 MG 24 hr tablet Take 1 tablet (100 mg total) by mouth daily. Take with or immediately following a meal.  30 tablet  6  . simvastatin (ZOCOR) 40 MG tablet Take 1 tablet (40 mg total) by mouth every evening.  30 tablet  6   No current facility-administered medications for  this visit.   Facility-Administered Medications Ordered in Other Visits  Medication Dose Route Frequency Provider Last Rate Last Dose  . 0.9 %  sodium chloride infusion  250 mL Intravenous PRN Laurey Morale, MD      . sodium chloride 0.9 % injection 3 mL  3 mL Intravenous Q12H Laurey Morale, MD      . sodium chloride 0.9 % injection 3 mL  3 mL Intravenous PRN Laurey Morale, MD           Review of Systems:     Cardiac Review of Systems: Y or N  Chest Pain [  n  ]  Resting SOB [ n  ] Exertional SOB  [ mild ]  Orthopnea [ n ]   Pedal Edema [ n  ]    Palpitations [ n ] Syncope  [ n ]   Presyncope [n   ]  General Review of Systems: [Y] = yes [  ]=no Constitional: recent weight change [ y ]; anorexia [  ]; fatigue [n  ]; nausea [n  ]; night sweats [  ]; fever [  ]; or chills [  ];                                                                                                                                          Dental: poor dentition[n  ]; Last Dentist visit: recent  Eye : blurred vision [  ]; diplopia [   ]; vision changes [ n ];  Amaurosis fugax[  n]; Resp: cough [ n ];  wheezing[ n ];  hemoptysis[ n ]; shortness of breath[ n ]; paroxysmal nocturnal dyspnea[ n ]; dyspnea on exertion[  ]; or orthopnea[  ];  GI:  gallstones[  ], vomiting[  ];  dysphagia[  ];  melena[  n];  hematochezia [ n ]; heartburn[  ];   Hx of  Colonoscopy[y  ]; GU: kidney stones [  ]; hematuria[  ];   dysuria [  ];  nocturia[  ];  history of     obstruction [  ];             Skin: rash, swelling[  ];, hair loss[  ];  peripheral edema[  ];  or itching[  ]; Musculosketetal: myalgias[  ];  joint swelling[  ];  joint erythema[  ];  joint pain[  ];  back pain[  ];  Heme/Lymph: bruising[n  ];  bleeding[  n];  anemia[ n ];  Neuro: TIA[  ];  headaches[  ];  stroke[  ];  vertigo[  ];  seizures[  ];   paresthesias[  ];  difficulty walking[  ];  Psych:depression[  ]; anxiety[  ];  Endocrine: diabetes[  ];  thyroid  dysfunction[  ];  Immunizations: Flu [place].Etienne  ]; Pneumococcal[ y ];  Other:  Physical Exam: BP 147/97  Pulse 72  Resp 20  Ht 6\' 4"  (1.93 m)  Wt 325 lb (147.419 kg)  BMI 39.56 kg/m2  SpO2 95%  General appearance: alert, cooperative, appears stated age and no distress Neurologic: intact Heart: regular rate and rhythm, S1, S2 normal, no murmur, click, rub or gallop and normal apical impulse Lungs: clear to auscultation bilaterally and normal percussion bilaterally Abdomen: soft, non-tender; bowel sounds normal; no masses,  no organomegaly Extremities: extremities normal, atraumatic, no cyanosis or edema, Homans sign is negative, no sign of DVT and no edema, redness or tenderness in the calves or thighs No carotid bruits, full and equal carotid brachial femoral DP and PT pulses   Diagnostic Studies & Laboratory data:     Recent Radiology Findings:  RADIOLOGY REPORT*  Clinical Data: Aortic root dilatation, left arm numbness for  several weeks  CT ANGIOGRAPHY CHEST WITH CONTRAST  Technique: Multidetector CT imaging of the chest was performed  using the standard protocol during bolus administration of  intravenous contrast. Multiplanar CT image reconstructions  including MIPs were obtained to evaluate the vascular anatomy.  Contrast: OMNIPAQUE IOHEXOL 350 MG/ML IV SOLN  Comparison: MRA chest 02/12/2011  Findings:  Aneurysmal dilatation ascending thoracic aorta and aortic root.  Aortic root measures 6.1 x 5.1 cm in axial dimensions image 64,  immediately proximal to left main coronary artery origin.  At level of mid pulmonary artery, ascending aorta measures 4.2 x  4.1 cm image 52.  Aortic arch measures 3.1 cm transverse and 2.6 cm cranial-caudal.  Descending thoracic aorta at the level of the mid pulmonary artery  measures 2.8 x 2.7 cm.  No evidence of aortic dissection.  Few scattered coronary arterial calcifications noted.  Question left ventricular hypertrophy.  Calcified  left hilar adenopathy and left lower lobe granuloma.  No additional thoracic adenopathy seen.  Numerous calcified granulomata within spleen.  Question tiny gallstone image 125.  Remaining visualized portions of upper abdomen unremarkable.  Remaining lungs clear.  No pleural effusion or pneumothorax.  No acute osseous findings.  Review of the MIP images confirms the above findings.  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  Old granulomatous disease.  Question cholelithiasis.  Original Report Authenticated By: Lollie Marrow, M.D.  Cardiac MRI RADIOLOGY REPORT*  Clinical Data: Aortic root  dilation on transthoracic echo.  MRA CHEST WITH OR WITHOUT CONTRAST  GE 1.5 T magnet with dedicated cardiac coil. Limited FIESTA  sequences to assess the aortic valve. 20 mL Multihance contrast  was injected for MR angiography.  Contrast: 20 mL Multihance  Comparison: None.  Findings: The aortic valve was trileaflet with aneurysmal dilation  especially of the right and left coronary cusps. There was no  significant aortic stenosis or aortic regurgitation. The aortic  root was dilated to 5.5 cm at maximal dimension. The ascending  aorta was mildly enlarged to 3.8 cm. The remainder of the thoracic  aorta was not significantly dilated. The pulmonary veins drained  normally to the left atrium. The arch vessels originated normally.  Measurements:  Aortic root 5.5 cm  Ascending aorta at maximal dimension 3.8 cm  Aortic arch (proximal) 3.3 cm  Isthmus 2.5 cm  Descending thoracic aorta 2.7 cm  IMPRESSION:  1. Aneurysmal dilation of the aortic root to a maximum of 5.5 cm,  with dilation especially of the left and right coronary cusps.  2. The remainder of the thoracic aorta was not significantly  aneurysmal  3. Trileaflet aortic valve without significant stenosis or    regurgitation.  Original Report Authenticated By: [id number]    ECHO: Transthoracic Echocardiography  Patient: Keelyn, Fjelstad MR #: [id number] Study Date: 11/11/2011 Gender: M Age: 11 Height: 193cm Weight: 135.2kg BSA: 2.65m^2 Pt. Status: Room:  ATTENDING Melina Modena, MD REFERRING [name], MD SONOGRAPHER Aida Raider, RDCS PERFORMING [facility] cc: Dr. Janace Hoard  ------------------------------------------------------------ LV EF: 55% - 60%  ------------------------------------------------------------ Indications: Aneurysm - thoracic 441.2.  ------------------------------------------------------------ History: PMH: Acquired from the patient and from the patient's chart. PMH: Sleep apnea. Chest pain. Risk factors: Hypertension. Diabetes mellitus. Obese. Dyslipidemia.  ------------------------------------------------------------ Study Conclusions  - Left ventricle: The cavity size was normal. There was moderate concentric hypertrophy. Systolic function was normal. The estimated ejection fraction was in the range of 55% to 60%. Wall motion was normal; there were no regional wall motion abnormalities. - Aorta: Severe dilatation of ascending aortic root. Should have F/U chest CTA to size entire thoracic aorta - Left atrium: The atrium was mildly dilated. - Atrial septum: No defect or patent foramen ovale was identified. Transthoracic echocardiography. M-mode, complete 2D, spectral Doppler, and color Doppler. Height: Height: 193cm. Height: 76in. Weight: Weight: 135.2kg. Weight: 297.4lb. Body mass index: BMI: 36.3kg/m^2. Body surface area: BSA: 2.44m^2. Blood pressure: 167/105. Patient status: Outpatient. Location: [place] [facility]  ------------------------------------------------------------  ------------------------------------------------------------ Left ventricle: The cavity size was normal. There was moderate  concentric hypertrophy. Systolic function was normal. The estimated ejection fraction was in the range of 55% to 60%. Wall motion was normal; there were no regional wall motion abnormalities.  ------------------------------------------------------------ Aortic valve: Trileaflet; normal thickness leaflets. Mobility was not restricted. Doppler: Transvalvular velocity was within the normal range. There was no stenosis. No regurgitation.  ------------------------------------------------------------ Aorta: Severe dilatation of ascending aortic root. Should have F/U chest CTA to size entire thoracic aorta  ------------------------------------------------------------ Mitral valve: Structurally normal valve. Mobility was not restricted. Doppler: Transvalvular velocity was within the normal range. There was no evidence for stenosis. No regurgitation. Peak gradient: 3mm Hg (D).  ------------------------------------------------------------ Left atrium: The atrium was mildly dilated.  ------------------------------------------------------------ Atrial septum: No defect or patent foramen ovale was identified.  ------------------------------------------------------------ Right ventricle: The cavity size was normal. Wall thickness was normal. Systolic function was normal.  ------------------------------------------------------------ Pulmonic valve: Doppler: Transvalvular velocity was within the  normal range. There was no evidence for stenosis.  ------------------------------------------------------------ Tricuspid valve: Structurally normal valve. Doppler: Transvalvular velocity was within the normal range. Trivial regurgitation.  ------------------------------------------------------------ Pulmonary artery: The main pulmonary artery was normal-sized. Systolic pressure was within the normal range.  ------------------------------------------------------------ Right atrium: The atrium  was normal in size.  ------------------------------------------------------------ Pericardium: The pericardium was normal in appearance. There was no pericardial effusion.  ------------------------------------------------------------ Systemic veins: Inferior vena cava: The vessel was normal in size.  ------------------------------------------------------------ Post procedure conclusions Ascending Aorta:  - Severe dilatation of ascending aortic root. Should have F/U chest CTA to size entire thoracic aorta  ------------------------------------------------------------  2D measurements Normal Doppler measurements Normal Left ventricle Left ventricle LVID ED, 46.6 mm 43-52 Ea, lat 8.46 cm/s ------ chord, ann, tiss PLAX DP LVID ES, 26.3 mm 23-38 E/Ea, lat 9.52 ------ chord, ann, tiss PLAX DP FS, chord, 44 % >29 Ea, med 8.33 cm/s ------ PLAX ann, tiss LVPW, ED 15 mm ------ DP IVS/LVPW 1 <1.3 E/Ea, med 9.66 ------ ratio, ED ann, tiss Ventricular septum DP IVS, ED 15 mm ------ LVOT LVOT Peak vel, 107 cm/s ------ Diam, S 26 mm ------ S Area 5.31 cm^2 ------ VTI, S 24 cm ------ Diam 26 mm ------ Stroke vol 127.4 ml ------ Aorta Stroke 48.6 ml/m^ ------ Root diam, 53 mm ------ index 2 ED Mitral valve AAo AP 55 mm ------ Peak E vel 80.5 cm/s ------ diam, S Peak A vel 68.4 cm/s ------ Left atrium Decelerati 275 ms 150-23 AP dim 42 mm ------ on time 0 AP dim 1.6 cm/m^2 <2.2 Peak 3 mm Hg ------ index gradient, D Peak E/A 1.2 ------ ratio Right ventricle Sa vel, 15.7 cm/s ------ lat ann, tiss DP  ------------------------------------------------------------ Prepared and Electronically Authenticated by  Charlton Haws 2013-01-15T10:40:[id number]   CARDIAC CATH Cardiac Catheterization Procedure Note  Name: LAVAUGHN HABERLE  MRN: [id number]  DOB: July 24, 1961  Procedure: Selective Coronary Angiography, Aortic root angiography  Indication: Aortic root aneurysm, pre-repair.    Procedural Details: Allen's testing on the right wrist was positive. The right wrist was prepped, draped, and anesthetized with 1% lidocaine. Using the modified Seldinger technique, a 5 French sheath was introduced into the right radial artery. 3 mg of verapamil was administered through the sheath, weight-based unfractionated heparin was administered intravenously. Standard Judkins catheters were used for selective coronary angiography and aortic root angiography. Catheter exchanges were performed over an exchange length guidewire. There were no immediate procedural complications. A TR band was used for radial hemostasis at the completion of the procedure. The patient was transferred to the post catheterization recovery area for further monitoring.  Procedural Findings:  Hemodynamics:  AO 122/84  Coronary angiography:  Coronary dominance: right  Left mainstem: Very short vessel with trifurcation into LAD, ramus, circumflex.  Left anterior descending (LAD): Mild ectasia. Diffuse mild luminal irregularities. Moderate 2nd diagonal with long 50% stenosis.  Left circumflex (LCx): Moderate ramus with luminal irregularities. LCx is a large vessel with luminal irregularities.  Right coronary artery (RCA): Dominent vessel with ectasia. 60% proximal RCA stenosis at a bend in the vessel. 50% mid RCA stenosis.  Aortic root angiography: Enlarged aortic root and ascending aorta consistent with prior imaging.  Final Conclusions: Ectatic, diffusely diseased coronaries. Most significant disease was in the RCA where there was a 60% proximal vessel stenosis and a 50% mid vessel stenosis. There was also a long 50% stenosis in a moderate 2nd diagonal.  Recommendations: Will discuss with Dr. Tyrone Sage. I do not think he has flow-limiting  coronary disease but would consider grafting at least the RCA at time of aortic root repair. Will need aggressive treatment for CAD risk. Goal LDL < 70.  [name]  11/14/2011, 12:20 PM          Recent Lab Findings: Lab Results  Component Value Date   WBC 8.8 11/11/2011   HGB 14.7 11/11/2011   HCT 42.8 11/11/2011   PLT 286.0 11/11/2011   GLUCOSE 133* 11/14/2011   CHOL 197 11/25/2011   TRIG 111.0 11/25/2011   HDL 42.20 11/25/2011   LDLCALC 133* 11/25/2011   ALT 21 11/25/2011   AST 14 11/25/2011   NA 140 11/11/2011   K 3.6 11/11/2011   CL 103 11/11/2011   CREATININE 0.9 11/11/2011   BUN 14 11/11/2011   CO2 28 11/11/2011   INR 1.0 11/11/2011      Assessment / Plan:      The patient returns today to further discuss operative repair of his dilated aortic root now noted to be size compatible with elective repair. He has no physical signs or echo evidence of aortic insufficiency, imaging indicates that he has a trileaflet valve. I've recommended aortic root replacement to  the patient because of the risk of spontaneous aortic dissection. We discussed valve sparing aortic root replacement and also use of prosthetic valve in preserving his own valve is not feasible. The patient has no direct contraindication to the use of Coumadin, he does understand that a mechanical prosthesis would require lifelong anticoagulation. The risk of surgery and the risks of continued medical therapy are reviewed with the patient and his wife in detail. He is at this point willing to proceed with surgery. I tentatively plan for surgery in late June, patient was concerned about his available time off from work.     Delight Ovens MD  Beeper [phone] Office [phone] 03/09/2012 4:43 PM

## 2012-03-30 ENCOUNTER — Other Ambulatory Visit: Payer: BC Managed Care – PPO

## 2012-04-07 ENCOUNTER — Other Ambulatory Visit: Payer: Self-pay

## 2012-04-07 ENCOUNTER — Other Ambulatory Visit: Payer: Self-pay | Admitting: Cardiothoracic Surgery

## 2012-04-07 DIAGNOSIS — I2510 Atherosclerotic heart disease of native coronary artery without angina pectoris: Secondary | ICD-10-CM

## 2012-04-07 DIAGNOSIS — I712 Thoracic aortic aneurysm, without rupture, unspecified: Secondary | ICD-10-CM

## 2012-04-24 ENCOUNTER — Encounter (HOSPITAL_COMMUNITY): Payer: Self-pay | Admitting: Pharmacy Technician

## 2012-04-26 NOTE — Pre-Procedure Instructions (Signed)
20 Jonathan Miles  04/26/2012   Your procedure is scheduled on:  Friday, July 12th.  Report to [facility] at 5:30 AM.  Call this number if you have problems the morning of surgery: [phone]   Remember:   Do not eat food or drink any liquid:After Midnight.   Take these medicines the morning of surgery with A SIP OF WATER: Metoprolol (Toprol XL).  May take Tylenol if needed.   Do not wear jewelry, make-up or nail polish.  Do not wear lotions, powders, or perfumes. You may wear deodorant.  Do not shave 48 hours prior to surgery. Men may shave face and neck.  Do not bring valuables to the hospital.  Contacts, dentures or bridgework may not be worn into surgery.  Leave suitcase in the car. After surgery it may be brought to your room.  For patients admitted to the hospital, checkout time is 11:00 AM the day of discharge.   Patients discharged the day of surgery will not be allowed to drive home.  Name and phone number of your driver: NA  Special Instructions: Incentive Spirometry - Practice and bring it with you on the day of surgery. and CHG Shower Use Special Wash: 1/2 bottle night before surgery and 1/2 bottle morning of surgery.   Please read over the following fact sheets that you were given: Pain Booklet, Coughing and Deep Breathing, Blood Transfusion Information, Open Heart Packet and Surgical Site Infection Prevention

## 2012-04-27 ENCOUNTER — Inpatient Hospital Stay (HOSPITAL_COMMUNITY): Admission: RE | Admit: 2012-04-27 | Discharge: 2012-04-27 | Payer: BC Managed Care – PPO | Source: Ambulatory Visit

## 2012-04-27 ENCOUNTER — Encounter (HOSPITAL_COMMUNITY): Payer: Self-pay

## 2012-04-27 HISTORY — DX: Unspecified asthma, uncomplicated: J45.909

## 2012-04-27 HISTORY — DX: Gastro-esophageal reflux disease without esophagitis: K21.9

## 2012-04-27 HISTORY — DX: Unspecified osteoarthritis, unspecified site: M19.90

## 2012-05-05 ENCOUNTER — Encounter (HOSPITAL_COMMUNITY)
Admission: RE | Admit: 2012-05-05 | Discharge: 2012-05-05 | Disposition: A | Discharge status: Home / Self Care | Payer: BC Managed Care – PPO | Source: Ambulatory Visit | Attending: Cardiothoracic Surgery | Admitting: Cardiothoracic Surgery

## 2012-05-05 ENCOUNTER — Ambulatory Visit (HOSPITAL_COMMUNITY)
Admission: RE | Admit: 2012-05-05 | Discharge: 2012-05-05 | Disposition: A | Discharge status: Home / Self Care | Payer: BC Managed Care – PPO | Source: Ambulatory Visit | Attending: Cardiothoracic Surgery | Admitting: Cardiothoracic Surgery

## 2012-05-05 ENCOUNTER — Encounter (HOSPITAL_COMMUNITY): Payer: Self-pay

## 2012-05-05 VITALS — BP 131/88 | HR 74 | Temp 98.4°F | Resp 20 | Ht 73.0 in | Wt 305.4 lb

## 2012-05-05 DIAGNOSIS — Z01812 Encounter for preprocedural laboratory examination: Secondary | ICD-10-CM | POA: Insufficient documentation

## 2012-05-05 DIAGNOSIS — I77810 Thoracic aortic ectasia: Secondary | ICD-10-CM

## 2012-05-05 DIAGNOSIS — I712 Thoracic aortic aneurysm, without rupture, unspecified: Secondary | ICD-10-CM | POA: Insufficient documentation

## 2012-05-05 DIAGNOSIS — I2510 Atherosclerotic heart disease of native coronary artery without angina pectoris: Secondary | ICD-10-CM

## 2012-05-05 DIAGNOSIS — Z01818 Encounter for other preprocedural examination: Secondary | ICD-10-CM | POA: Insufficient documentation

## 2012-05-05 DIAGNOSIS — Z01810 Encounter for preprocedural cardiovascular examination: Secondary | ICD-10-CM

## 2012-05-05 LAB — BLOOD GAS, ARTERIAL
Acid-Base Excess: 4.2 mmol/L — ABNORMAL HIGH (ref 0.0–2.0)
Bicarbonate: 28.4 mEq/L — ABNORMAL HIGH (ref 20.0–24.0)
Drawn by: 344381
FIO2: 0.21 %
O2 Saturation: 80.3 %
Patient temperature: 98.6
TCO2: 29.7 mmol/L (ref 0–100)
pCO2 arterial: 44 mmHg (ref 35.0–45.0)
pH, Arterial: 7.426 (ref 7.350–7.450)
pO2, Arterial: 46 mmHg — ABNORMAL LOW (ref 80.0–100.0)

## 2012-05-05 LAB — SURGICAL PCR SCREEN
MRSA, PCR: NEGATIVE
Staphylococcus aureus: POSITIVE — AB

## 2012-05-05 LAB — PULMONARY FUNCTION TEST

## 2012-05-05 LAB — URINALYSIS, ROUTINE W REFLEX MICROSCOPIC
Bilirubin Urine: NEGATIVE
Glucose, UA: NEGATIVE mg/dL
Hgb urine dipstick: NEGATIVE
Ketones, ur: NEGATIVE mg/dL
Leukocytes, UA: NEGATIVE
Nitrite: NEGATIVE
Protein, ur: NEGATIVE mg/dL
Specific Gravity, Urine: 1.022 (ref 1.005–1.030)
Urobilinogen, UA: 0.2 mg/dL (ref 0.0–1.0)
pH: 6 (ref 5.0–8.0)

## 2012-05-05 LAB — COMPREHENSIVE METABOLIC PANEL
ALT: 20 U/L (ref 0–53)
AST: 13 U/L (ref 0–37)
Albumin: 4.1 g/dL (ref 3.5–5.2)
Alkaline Phosphatase: 64 U/L (ref 39–117)
BUN: 18 mg/dL (ref 6–23)
CO2: 28 mEq/L (ref 19–32)
Calcium: 9.6 mg/dL (ref 8.4–10.5)
Chloride: 103 mEq/L (ref 96–112)
Creatinine, Ser: 1.04 mg/dL (ref 0.50–1.35)
GFR calc Af Amer: >90 mL/min (ref >90)
GFR calc non Af Amer: 81 mL/min — ABNORMAL LOW (ref >90)
Glucose, Bld: 109 mg/dL — ABNORMAL HIGH (ref 70–99)
Potassium: 3.8 mEq/L (ref 3.5–5.1)
Sodium: 141 mEq/L (ref 135–145)
Total Bilirubin: 0.4 mg/dL (ref 0.3–1.2)
Total Protein: 7.2 g/dL (ref 6.0–8.3)

## 2012-05-05 LAB — CBC
HCT: 40.5 % (ref 39.0–52.0)
Hemoglobin: 14.3 g/dL (ref 13.0–17.0)
MCH: 29.5 pg (ref 26.0–34.0)
MCHC: 35.3 g/dL (ref 30.0–36.0)
MCV: 83.5 fL (ref 78.0–100.0)
Platelets: 244 10*3/uL (ref 150–400)
RBC: 4.85 MIL/uL (ref 4.22–5.81)
RDW: 13.7 % (ref 11.5–15.5)
WBC: 8.6 10*3/uL (ref 4.0–10.5)

## 2012-05-05 LAB — PROTIME-INR
INR: 1.09 (ref 0.00–1.49)
Prothrombin Time: 14.3 seconds (ref 11.6–15.2)

## 2012-05-05 LAB — APTT: aPTT: 30 seconds (ref 24–37)

## 2012-05-05 LAB — ABO/RH: ABO/RH(D): B POS

## 2012-05-05 LAB — TYPE AND SCREEN
ABO/RH(D): B POS
Antibody Screen: NEGATIVE

## 2012-05-05 NOTE — Progress Notes (Signed)
VASCULAR LAB PRELIMINARY  PRELIMINARY  PRELIMINARY  PRELIMINARY  Pre-op Cardiac Surgery  Carotid Findings:  No evidence of significant extracranial carotid stenosis. Vertebral artery flow is antegrade bilatrally  Upper Extremity Right Left  Brachial Pressures 131 Triphasic 135 Triphasic  Radial Waveforms Triphasic Triphasic  Ulnar Waveforms Triphasic Triphasic  Palmar Arch (Allen's Test) Normal Normal   Findings:  Doppler waveforms remained normal bilaterally with both radial and ulnar compressions    Lower  Extremity Right Left  Dorsalis Pedis 140 Triphasic 154 Triphasic      Posterior Tibial 155 Triphasic 165 Triphasic   Ankle/Brachial Indices 1.15 1.22    Findings:  ABIs and Doppler waveforms are within normal limits bilaterally at rest.   Jonathan Miles, RVS 05/05/2012, 5:18 PM

## 2012-05-06 LAB — HEMOGLOBIN A1C
Hgb A1c MFr Bld: 6.1 % — ABNORMAL HIGH (ref <5.7)
Mean Plasma Glucose: 128 mg/dL — ABNORMAL HIGH (ref <117)

## 2012-05-06 MED ORDER — VANCOMYCIN HCL 1000 MG IV SOLR
1500.0000 mg | INTRAVENOUS | Status: AC
  Administered 2012-05-07: 1500 mg via INTRAVENOUS
  Filled 2012-05-06: qty 1500

## 2012-05-06 MED ORDER — DEXTROSE 5 % IV SOLN
750.0000 mg | INTRAVENOUS | Status: DC
  Filled 2012-05-06: qty 750

## 2012-05-06 MED ORDER — DEXMEDETOMIDINE HCL IN NACL 400 MCG/100ML IV SOLN
0.1000 ug/kg/h | INTRAVENOUS | Status: DC
  Filled 2012-05-06 (×2): qty 100

## 2012-05-06 MED ORDER — SODIUM BICARBONATE 8.4 % IV SOLN
INTRAVENOUS | Status: AC
  Administered 2012-05-07: 09:00:00
  Filled 2012-05-06: qty 2.5

## 2012-05-06 MED ORDER — EPINEPHRINE HCL 1 MG/ML IJ SOLN
0.5000 ug/min | INTRAVENOUS | Status: DC
  Filled 2012-05-06: qty 4

## 2012-05-06 MED ORDER — PHENYLEPHRINE HCL 10 MG/ML IJ SOLN
30.0000 ug/min | INTRAVENOUS | Status: DC
  Filled 2012-05-06: qty 2

## 2012-05-06 MED ORDER — NITROGLYCERIN IN D5W 200-5 MCG/ML-% IV SOLN
2.0000 ug/min | INTRAVENOUS | Status: DC
  Filled 2012-05-06: qty 250

## 2012-05-06 MED ORDER — TRANEXAMIC ACID (OHS) BOLUS VIA INFUSION
15.0000 mg/kg | INTRAVENOUS | Status: AC
  Administered 2012-05-07: 1380 mg via INTRAVENOUS
  Filled 2012-05-06: qty 2070

## 2012-05-06 MED ORDER — TRANEXAMIC ACID (OHS) PUMP PRIME SOLUTION
2.0000 mg/kg | INTRAVENOUS | Status: DC
  Filled 2012-05-06: qty 2.76

## 2012-05-06 MED ORDER — DEXTROSE 5 % IV SOLN
1.5000 g | INTRAVENOUS | Status: AC
  Administered 2012-05-07: .75 g via INTRAVENOUS
  Administered 2012-05-07: 1.5 g via INTRAVENOUS
  Filled 2012-05-06 (×2): qty 1.5

## 2012-05-06 MED ORDER — SODIUM CHLORIDE 0.9 % IV SOLN
INTRAVENOUS | Status: DC
  Filled 2012-05-06: qty 1

## 2012-05-06 MED ORDER — POTASSIUM CHLORIDE 2 MEQ/ML IV SOLN
80.0000 meq | INTRAVENOUS | Status: DC
  Filled 2012-05-06: qty 40

## 2012-05-06 MED ORDER — SODIUM CHLORIDE 0.9 % IV SOLN
1.5000 mg/kg/h | INTRAVENOUS | Status: AC
  Administered 2012-05-07: 1.5 mg/kg/h via INTRAVENOUS
  Filled 2012-05-06: qty 25

## 2012-05-06 MED ORDER — DOPAMINE-DEXTROSE 3.2-5 MG/ML-% IV SOLN
2.0000 ug/kg/min | INTRAVENOUS | Status: AC
  Administered 2012-05-07: 3 ug/kg/min via INTRAVENOUS
  Filled 2012-05-06: qty 250

## 2012-05-06 MED ORDER — MAGNESIUM SULFATE 50 % IJ SOLN
40.0000 meq | INTRAMUSCULAR | Status: DC
  Filled 2012-05-06: qty 10

## 2012-05-06 NOTE — Progress Notes (Signed)
See note from Dr. Shelle Iron  On 02--10-2011 for results of sleep study done in 2006. Pt. Has been non-compliant in the past using CPAP.Marland Kitchen  Pt. States that he is currently trying to use his CPAP again and will bring his own mask to use after surgery.

## 2012-05-07 ENCOUNTER — Encounter (HOSPITAL_COMMUNITY): Payer: Self-pay | Admitting: Anesthesiology

## 2012-05-07 ENCOUNTER — Inpatient Hospital Stay (HOSPITAL_COMMUNITY): Payer: BC Managed Care – PPO

## 2012-05-07 ENCOUNTER — Ambulatory Visit (HOSPITAL_COMMUNITY): Payer: BC Managed Care – PPO | Admitting: Anesthesiology

## 2012-05-07 ENCOUNTER — Inpatient Hospital Stay (HOSPITAL_COMMUNITY)
Admission: RE | Admit: 2012-05-07 | Discharge: 2012-05-15 | DRG: 109 | Disposition: A | Discharge status: Home / Self Care | Payer: BC Managed Care – PPO | Source: Ambulatory Visit | Attending: Cardiothoracic Surgery | Admitting: Cardiothoracic Surgery

## 2012-05-07 ENCOUNTER — Encounter (HOSPITAL_COMMUNITY)
Admission: RE | Disposition: A | Discharge status: Home / Self Care | Payer: Self-pay | Source: Ambulatory Visit | Attending: Cardiothoracic Surgery

## 2012-05-07 ENCOUNTER — Encounter (HOSPITAL_COMMUNITY): Payer: Self-pay | Admitting: *Deleted

## 2012-05-07 DIAGNOSIS — I77819 Aortic ectasia, unspecified site: Principal | ICD-10-CM | POA: Diagnosis present

## 2012-05-07 DIAGNOSIS — Q211 Atrial septal defect: Secondary | ICD-10-CM

## 2012-05-07 DIAGNOSIS — G4733 Obstructive sleep apnea (adult) (pediatric): Secondary | ICD-10-CM | POA: Diagnosis present

## 2012-05-07 DIAGNOSIS — I712 Thoracic aortic aneurysm, without rupture: Secondary | ICD-10-CM

## 2012-05-07 DIAGNOSIS — E785 Hyperlipidemia, unspecified: Secondary | ICD-10-CM | POA: Diagnosis present

## 2012-05-07 DIAGNOSIS — Z794 Long term (current) use of insulin: Secondary | ICD-10-CM

## 2012-05-07 DIAGNOSIS — E119 Type 2 diabetes mellitus without complications: Secondary | ICD-10-CM | POA: Diagnosis present

## 2012-05-07 DIAGNOSIS — I2510 Atherosclerotic heart disease of native coronary artery without angina pectoris: Secondary | ICD-10-CM

## 2012-05-07 DIAGNOSIS — Z95828 Presence of other vascular implants and grafts: Secondary | ICD-10-CM

## 2012-05-07 DIAGNOSIS — D62 Acute posthemorrhagic anemia: Secondary | ICD-10-CM | POA: Diagnosis not present

## 2012-05-07 DIAGNOSIS — I77810 Thoracic aortic ectasia: Secondary | ICD-10-CM

## 2012-05-07 DIAGNOSIS — I10 Essential (primary) hypertension: Secondary | ICD-10-CM | POA: Diagnosis present

## 2012-05-07 DIAGNOSIS — Z888 Allergy status to other drugs, medicaments and biological substances status: Secondary | ICD-10-CM

## 2012-05-07 DIAGNOSIS — Z87891 Personal history of nicotine dependence: Secondary | ICD-10-CM

## 2012-05-07 DIAGNOSIS — D72829 Elevated white blood cell count, unspecified: Secondary | ICD-10-CM | POA: Diagnosis not present

## 2012-05-07 DIAGNOSIS — Q2111 Secundum atrial septal defect: Secondary | ICD-10-CM

## 2012-05-07 DIAGNOSIS — Z951 Presence of aortocoronary bypass graft: Secondary | ICD-10-CM

## 2012-05-07 DIAGNOSIS — M129 Arthropathy, unspecified: Secondary | ICD-10-CM | POA: Diagnosis present

## 2012-05-07 DIAGNOSIS — E8779 Other fluid overload: Secondary | ICD-10-CM | POA: Diagnosis not present

## 2012-05-07 DIAGNOSIS — J9819 Other pulmonary collapse: Secondary | ICD-10-CM | POA: Diagnosis not present

## 2012-05-07 DIAGNOSIS — K219 Gastro-esophageal reflux disease without esophagitis: Secondary | ICD-10-CM | POA: Diagnosis present

## 2012-05-07 HISTORY — PX: CORONARY ARTERY BYPASS GRAFT: SHX141

## 2012-05-07 LAB — POCT I-STAT 3, ART BLOOD GAS (G3+)
Acid-Base Excess: 2 mmol/L (ref 0.0–2.0)
Acid-Base Excess: 2 mmol/L (ref 0.0–2.0)
Acid-Base Excess: 3 mmol/L — ABNORMAL HIGH (ref 0.0–2.0)
Acid-base deficit: 4 mmol/L — ABNORMAL HIGH (ref 0.0–2.0)
Acid-base deficit: 1 mmol/L (ref 0.0–2.0)
Acid-base deficit: 3 mmol/L — ABNORMAL HIGH (ref 0.0–2.0)
Bicarbonate: 28.2 mEq/L — ABNORMAL HIGH (ref 20.0–24.0)
Bicarbonate: 26.3 mEq/L — ABNORMAL HIGH (ref 20.0–24.0)
Bicarbonate: 21.8 mEq/L (ref 20.0–24.0)
Bicarbonate: 26.9 mEq/L — ABNORMAL HIGH (ref 20.0–24.0)
Bicarbonate: 23 mEq/L (ref 20.0–24.0)
Bicarbonate: 23.1 mEq/L (ref 20.0–24.0)
O2 Saturation: 100 %
O2 Saturation: 100 %
O2 Saturation: 100 %
O2 Saturation: 99 %
O2 Saturation: 93 %
O2 Saturation: 96 %
Patient temperature: 36.8
Patient temperature: 36.8
Patient temperature: 36.8
TCO2: 30 mmol/L (ref 0–100)
TCO2: 27 mmol/L (ref 0–100)
TCO2: 23 mmol/L (ref 0–100)
TCO2: 28 mmol/L (ref 0–100)
TCO2: 24 mmol/L (ref 0–100)
TCO2: 24 mmol/L (ref 0–100)
pCO2 arterial: 48.6 mmHg — ABNORMAL HIGH (ref 35.0–45.0)
pCO2 arterial: 38.8 mmHg (ref 35.0–45.0)
pCO2 arterial: 42.9 mmHg (ref 35.0–45.0)
pCO2 arterial: 39.2 mmHg (ref 35.0–45.0)
pCO2 arterial: 36.5 mmHg (ref 35.0–45.0)
pCO2 arterial: 43 mmHg (ref 35.0–45.0)
pH, Arterial: 7.371 (ref 7.350–7.450)
pH, Arterial: 7.439 (ref 7.350–7.450)
pH, Arterial: 7.315 — ABNORMAL LOW (ref 7.350–7.450)
pH, Arterial: 7.443 (ref 7.350–7.450)
pH, Arterial: 7.407 (ref 7.350–7.450)
pH, Arterial: 7.338 — ABNORMAL LOW (ref 7.350–7.450)
pO2, Arterial: 303 mmHg — ABNORMAL HIGH (ref 80.0–100.0)
pO2, Arterial: 394 mmHg — ABNORMAL HIGH (ref 80.0–100.0)
pO2, Arterial: 231 mmHg — ABNORMAL HIGH (ref 80.0–100.0)
pO2, Arterial: 118 mmHg — ABNORMAL HIGH (ref 80.0–100.0)
pO2, Arterial: 64 mmHg — ABNORMAL LOW (ref 80.0–100.0)
pO2, Arterial: 84 mmHg (ref 80.0–100.0)

## 2012-05-07 LAB — POCT I-STAT 4, (NA,K, GLUC, HGB,HCT)
Glucose, Bld: 119 mg/dL — ABNORMAL HIGH (ref 70–99)
Glucose, Bld: 104 mg/dL — ABNORMAL HIGH (ref 70–99)
Glucose, Bld: 184 mg/dL — ABNORMAL HIGH (ref 70–99)
Glucose, Bld: 220 mg/dL — ABNORMAL HIGH (ref 70–99)
Glucose, Bld: 226 mg/dL — ABNORMAL HIGH (ref 70–99)
Glucose, Bld: 216 mg/dL — ABNORMAL HIGH (ref 70–99)
Glucose, Bld: 207 mg/dL — ABNORMAL HIGH (ref 70–99)
Glucose, Bld: 198 mg/dL — ABNORMAL HIGH (ref 70–99)
Glucose, Bld: 128 mg/dL — ABNORMAL HIGH (ref 70–99)
Glucose, Bld: 96 mg/dL (ref 70–99)
HCT: 37 % — ABNORMAL LOW (ref 39.0–52.0)
HCT: 30 % — ABNORMAL LOW (ref 39.0–52.0)
HCT: 31 % — ABNORMAL LOW (ref 39.0–52.0)
HCT: 32 % — ABNORMAL LOW (ref 39.0–52.0)
HCT: 33 % — ABNORMAL LOW (ref 39.0–52.0)
HCT: 31 % — ABNORMAL LOW (ref 39.0–52.0)
HCT: 29 % — ABNORMAL LOW (ref 39.0–52.0)
HCT: 34 % — ABNORMAL LOW (ref 39.0–52.0)
HCT: 36 % — ABNORMAL LOW (ref 39.0–52.0)
HCT: 38 % — ABNORMAL LOW (ref 39.0–52.0)
Hemoglobin: 12.6 g/dL — ABNORMAL LOW (ref 13.0–17.0)
Hemoglobin: 10.2 g/dL — ABNORMAL LOW (ref 13.0–17.0)
Hemoglobin: 10.5 g/dL — ABNORMAL LOW (ref 13.0–17.0)
Hemoglobin: 10.9 g/dL — ABNORMAL LOW (ref 13.0–17.0)
Hemoglobin: 11.2 g/dL — ABNORMAL LOW (ref 13.0–17.0)
Hemoglobin: 10.5 g/dL — ABNORMAL LOW (ref 13.0–17.0)
Hemoglobin: 9.9 g/dL — ABNORMAL LOW (ref 13.0–17.0)
Hemoglobin: 11.6 g/dL — ABNORMAL LOW (ref 13.0–17.0)
Hemoglobin: 12.2 g/dL — ABNORMAL LOW (ref 13.0–17.0)
Hemoglobin: 12.9 g/dL — ABNORMAL LOW (ref 13.0–17.0)
Potassium: 3.4 mEq/L — ABNORMAL LOW (ref 3.5–5.1)
Potassium: 3.7 mEq/L (ref 3.5–5.1)
Potassium: 3.9 mEq/L (ref 3.5–5.1)
Potassium: 3.7 mEq/L (ref 3.5–5.1)
Potassium: 3.7 mEq/L (ref 3.5–5.1)
Potassium: 4.9 mEq/L (ref 3.5–5.1)
Potassium: 3.9 mEq/L (ref 3.5–5.1)
Potassium: 3.8 mEq/L (ref 3.5–5.1)
Potassium: 3.4 mEq/L — ABNORMAL LOW (ref 3.5–5.1)
Potassium: 3.7 mEq/L (ref 3.5–5.1)
Sodium: 144 mEq/L (ref 135–145)
Sodium: 144 mEq/L (ref 135–145)
Sodium: 141 mEq/L (ref 135–145)
Sodium: 141 mEq/L (ref 135–145)
Sodium: 141 mEq/L (ref 135–145)
Sodium: 140 mEq/L (ref 135–145)
Sodium: 139 mEq/L (ref 135–145)
Sodium: 143 mEq/L (ref 135–145)
Sodium: 149 mEq/L — ABNORMAL HIGH (ref 135–145)
Sodium: 145 mEq/L (ref 135–145)

## 2012-05-07 LAB — CBC
HCT: 37.2 % — ABNORMAL LOW (ref 39.0–52.0)
Hemoglobin: 13.3 g/dL (ref 13.0–17.0)
MCH: 29.5 pg (ref 26.0–34.0)
MCHC: 35.8 g/dL (ref 30.0–36.0)
MCV: 82.5 fL (ref 78.0–100.0)
Platelets: 127 10*3/uL — ABNORMAL LOW (ref 150–400)
RBC: 4.51 MIL/uL (ref 4.22–5.81)
RDW: 13.6 % (ref 11.5–15.5)
WBC: 17.5 10*3/uL — ABNORMAL HIGH (ref 4.0–10.5)

## 2012-05-07 LAB — PROTIME-INR
INR: 1.69 — ABNORMAL HIGH (ref 0.00–1.49)
INR: 1.54 — ABNORMAL HIGH (ref 0.00–1.49)
Prothrombin Time: 20.2 seconds — ABNORMAL HIGH (ref 11.6–15.2)
Prothrombin Time: 18.8 seconds — ABNORMAL HIGH (ref 11.6–15.2)

## 2012-05-07 LAB — HEMOGLOBIN AND HEMATOCRIT, BLOOD
HCT: 30 % — ABNORMAL LOW (ref 39.0–52.0)
Hemoglobin: 10.6 g/dL — ABNORMAL LOW (ref 13.0–17.0)

## 2012-05-07 LAB — MAGNESIUM: Magnesium: 1.9 mg/dL (ref 1.5–2.5)

## 2012-05-07 LAB — APTT
aPTT: 35 seconds (ref 24–37)
aPTT: 36 seconds (ref 24–37)

## 2012-05-07 LAB — POCT I-STAT GLUCOSE
Glucose, Bld: 110 mg/dL — ABNORMAL HIGH (ref 70–99)
Glucose, Bld: 132 mg/dL — ABNORMAL HIGH (ref 70–99)
Operator id: 132841
Operator id: 151361

## 2012-05-07 LAB — PLATELET COUNT: Platelets: 159 10*3/uL (ref 150–400)

## 2012-05-07 SURGERY — ASCENDING AORTIC ROOT REPLACEMENT
Anesthesia: General | Site: Chest

## 2012-05-07 MED ORDER — AMIODARONE HCL IN DEXTROSE 360-4.14 MG/200ML-% IV SOLN
0.5000 mg/min | INTRAVENOUS | Status: DC
  Administered 2012-05-08 – 2012-05-09 (×3): 0.5 mg/min via INTRAVENOUS
  Filled 2012-05-07 (×11): qty 200

## 2012-05-07 MED ORDER — ACETAMINOPHEN 160 MG/5ML PO SOLN: 975.0000 mg | Freq: Four times a day (QID) | ORAL | Status: DC

## 2012-05-07 MED ORDER — DEXMEDETOMIDINE HCL IN NACL 400 MCG/100ML IV SOLN
0.4000 ug/kg/h | INTRAVENOUS | Status: DC
  Filled 2012-05-07: qty 100

## 2012-05-07 MED ORDER — SODIUM CHLORIDE 0.9 % IV SOLN
INTRAVENOUS | Status: DC | PRN
  Administered 2012-05-07: 07:00:00 via INTRAVENOUS

## 2012-05-07 MED ORDER — ACETAMINOPHEN 650 MG RE SUPP
650.0000 mg | RECTAL | Status: AC
  Administered 2012-05-07: 650 mg via RECTAL

## 2012-05-07 MED ORDER — LACTATED RINGERS IV SOLN
500.0000 mL | Freq: Once | INTRAVENOUS | Status: AC | PRN
  Administered 2012-05-07: 500 mL via INTRAVENOUS

## 2012-05-07 MED ORDER — POTASSIUM CHLORIDE 10 MEQ/50ML IV SOLN
10.0000 meq | INTRAVENOUS | Status: DC
Start: 2012-05-07 — End: 2012-05-07
  Filled 2012-05-07 (×2): qty 50

## 2012-05-07 MED ORDER — SODIUM CHLORIDE 0.9 % IJ SOLN
OROMUCOSAL | Status: DC | PRN
  Administered 2012-05-07 (×3): via TOPICAL

## 2012-05-07 MED ORDER — HEPARIN SODIUM (PORCINE) 1000 UNIT/ML IJ SOLN
INTRAMUSCULAR | Status: DC | PRN
  Administered 2012-05-07: 43000 [IU] via INTRAVENOUS

## 2012-05-07 MED ORDER — SODIUM CHLORIDE 0.9 % IV SOLN
INTRAVENOUS | Status: AC
  Filled 2012-05-07: qty 1

## 2012-05-07 MED ORDER — SODIUM CHLORIDE 0.9 % IV SOLN
INTRAVENOUS | Status: DC
  Filled 2012-05-07: qty 1

## 2012-05-07 MED ORDER — NITROGLYCERIN IN D5W 200-5 MCG/ML-% IV SOLN: 0.0000 ug/min | INTRAVENOUS | Status: DC

## 2012-05-07 MED ORDER — ACETAMINOPHEN 160 MG/5ML PO SOLN: 650.0000 mg | ORAL | Status: AC

## 2012-05-07 MED ORDER — ROCURONIUM BROMIDE 100 MG/10ML IV SOLN
INTRAVENOUS | Status: DC | PRN
  Administered 2012-05-07: 50 mg via INTRAVENOUS

## 2012-05-07 MED ORDER — PHENYLEPHRINE HCL 10 MG/ML IJ SOLN
0.0000 ug/min | INTRAVENOUS | Status: DC
  Filled 2012-05-07: qty 2

## 2012-05-07 MED ORDER — BISACODYL 5 MG PO TBEC
10.0000 mg | DELAYED_RELEASE_TABLET | Freq: Every day | ORAL | Status: DC
  Administered 2012-05-08 – 2012-05-10 (×3): 10 mg via ORAL
  Filled 2012-05-07 (×3): qty 2

## 2012-05-07 MED ORDER — SODIUM CHLORIDE 0.9 % IJ SOLN
3.0000 mL | Freq: Two times a day (BID) | INTRAMUSCULAR | Status: DC
  Administered 2012-05-08 – 2012-05-10 (×5): 3 mL via INTRAVENOUS

## 2012-05-07 MED ORDER — POTASSIUM CHLORIDE 10 MEQ/50ML IV SOLN
10.0000 meq | INTRAVENOUS | Status: AC
  Administered 2012-05-07 (×4): 10 meq via INTRAVENOUS

## 2012-05-07 MED ORDER — LACTATED RINGERS IV SOLN: INTRAVENOUS | Status: DC

## 2012-05-07 MED ORDER — CHLORHEXIDINE GLUCONATE 4 % EX LIQD: 30.0000 mL | CUTANEOUS | Status: DC

## 2012-05-07 MED ORDER — NITROGLYCERIN IN D5W 200-5 MCG/ML-% IV SOLN
INTRAVENOUS | Status: DC | PRN
  Administered 2012-05-07: 5 ug/min via INTRAVENOUS

## 2012-05-07 MED ORDER — VECURONIUM BROMIDE 10 MG IV SOLR
INTRAVENOUS | Status: DC | PRN
  Administered 2012-05-07 (×7): 5 mg via INTRAVENOUS

## 2012-05-07 MED ORDER — INSULIN REGULAR BOLUS VIA INFUSION
0.0000 [IU] | Freq: Three times a day (TID) | INTRAVENOUS | Status: DC
  Filled 2012-05-07: qty 10

## 2012-05-07 MED ORDER — SIMVASTATIN 40 MG PO TABS
40.0000 mg | ORAL_TABLET | Freq: Every day | ORAL | Status: DC
  Filled 2012-05-07: qty 1

## 2012-05-07 MED ORDER — SODIUM CHLORIDE 0.9 % IV SOLN
200.0000 ug | INTRAVENOUS | Status: DC | PRN
  Administered 2012-05-07: 0.2 ug/kg/h via INTRAVENOUS

## 2012-05-07 MED ORDER — PHENYLEPHRINE HCL 10 MG/ML IJ SOLN
20.0000 mg | INTRAVENOUS | Status: DC | PRN
  Administered 2012-05-07: 25 ug/min via INTRAVENOUS

## 2012-05-07 MED ORDER — PROTAMINE SULFATE 10 MG/ML IV SOLN
INTRAVENOUS | Status: DC | PRN
  Administered 2012-05-07: 110 mg via INTRAVENOUS
  Administered 2012-05-07 (×2): 50 mg via INTRAVENOUS
  Administered 2012-05-07: 100 mg via INTRAVENOUS
  Administered 2012-05-07: 40 mg via INTRAVENOUS

## 2012-05-07 MED ORDER — PROPOFOL 10 MG/ML IV EMUL
INTRAVENOUS | Status: DC | PRN
  Administered 2012-05-07: 70 mg via INTRAVENOUS

## 2012-05-07 MED ORDER — SODIUM CHLORIDE 0.9 % IV SOLN: INTRAVENOUS | Status: DC

## 2012-05-07 MED ORDER — MAGNESIUM SULFATE 40 MG/ML IJ SOLN
4.0000 g | Freq: Once | INTRAMUSCULAR | Status: AC
  Administered 2012-05-07: 4 g via INTRAVENOUS
  Filled 2012-05-07: qty 100

## 2012-05-07 MED ORDER — MORPHINE SULFATE 2 MG/ML IJ SOLN
1.0000 mg | INTRAMUSCULAR | Status: AC | PRN
  Administered 2012-05-07 (×2): 2 mg via INTRAVENOUS
  Filled 2012-05-07: qty 2

## 2012-05-07 MED ORDER — METOPROLOL TARTRATE 12.5 MG HALF TABLET
12.5000 mg | ORAL_TABLET | Freq: Two times a day (BID) | ORAL | Status: DC
  Filled 2012-05-07 (×3): qty 1

## 2012-05-07 MED ORDER — POTASSIUM CHLORIDE 10 MEQ/50ML IV SOLN: 10.0000 meq | INTRAVENOUS | Status: DC

## 2012-05-07 MED ORDER — SODIUM BICARBONATE 8.4 % IV SOLN
INTRAVENOUS | Status: DC | PRN
  Administered 2012-05-07 (×2): 50 meq via INTRAVENOUS

## 2012-05-07 MED ORDER — TRANEXAMIC ACID 100 MG/ML IV SOLN
1.5000 mg/kg/h | INTRAVENOUS | Status: AC
  Administered 2012-05-07: 207 mg/h via INTRAVENOUS
  Filled 2012-05-07: qty 25

## 2012-05-07 MED ORDER — DEXTROSE 5 % IV SOLN
1.5000 g | Freq: Two times a day (BID) | INTRAVENOUS | Status: AC
  Administered 2012-05-08 – 2012-05-09 (×4): 1.5 g via INTRAVENOUS
  Filled 2012-05-07 (×6): qty 1.5

## 2012-05-07 MED ORDER — HEMOSTATIC AGENTS (NO CHARGE) OPTIME
TOPICAL | Status: DC | PRN
  Administered 2012-05-07: 2 via TOPICAL

## 2012-05-07 MED ORDER — ATORVASTATIN CALCIUM 20 MG PO TABS
20.0000 mg | ORAL_TABLET | Freq: Every day | ORAL | Status: DC
  Administered 2012-05-08 – 2012-05-15 (×8): 20 mg via ORAL
  Filled 2012-05-07 (×8): qty 1

## 2012-05-07 MED ORDER — DOPAMINE-DEXTROSE 3.2-5 MG/ML-% IV SOLN
5.0000 ug/kg/min | INTRAVENOUS | Status: DC
  Filled 2012-05-07 (×2): qty 250

## 2012-05-07 MED ORDER — METOPROLOL TARTRATE 1 MG/ML IV SOLN: 2.5000 mg | INTRAVENOUS | Status: DC | PRN

## 2012-05-07 MED ORDER — SODIUM CHLORIDE 0.45 % IV SOLN
INTRAVENOUS | Status: DC
  Administered 2012-05-07: 19:00:00 via INTRAVENOUS

## 2012-05-07 MED ORDER — ASPIRIN 81 MG PO CHEW: 324.0000 mg | CHEWABLE_TABLET | Freq: Every day | ORAL | Status: DC

## 2012-05-07 MED ORDER — ALBUMIN HUMAN 5 % IV SOLN
INTRAVENOUS | Status: DC | PRN
  Administered 2012-05-07 (×2): via INTRAVENOUS

## 2012-05-07 MED ORDER — MIDAZOLAM HCL 5 MG/5ML IJ SOLN
INTRAMUSCULAR | Status: DC | PRN
  Administered 2012-05-07 (×8): 2 mg via INTRAVENOUS
  Administered 2012-05-07: 4 mg via INTRAVENOUS

## 2012-05-07 MED ORDER — HEMOSTATIC AGENTS (NO CHARGE) OPTIME
TOPICAL | Status: DC | PRN
  Administered 2012-05-07: 3 via TOPICAL

## 2012-05-07 MED ORDER — DOCUSATE SODIUM 100 MG PO CAPS
200.0000 mg | ORAL_CAPSULE | Freq: Every day | ORAL | Status: DC
  Administered 2012-05-08 – 2012-05-10 (×3): 200 mg via ORAL
  Filled 2012-05-07 (×3): qty 2

## 2012-05-07 MED ORDER — ONDANSETRON HCL 4 MG/2ML IJ SOLN
4.0000 mg | Freq: Four times a day (QID) | INTRAMUSCULAR | Status: DC | PRN
  Administered 2012-05-07 – 2012-05-08 (×2): 4 mg via INTRAVENOUS
  Filled 2012-05-07 (×3): qty 2

## 2012-05-07 MED ORDER — LACTATED RINGERS IV SOLN
INTRAVENOUS | Status: DC | PRN
  Administered 2012-05-07: 07:00:00 via INTRAVENOUS

## 2012-05-07 MED ORDER — ASPIRIN EC 325 MG PO TBEC
325.0000 mg | DELAYED_RELEASE_TABLET | Freq: Every day | ORAL | Status: DC
  Administered 2012-05-08 – 2012-05-10 (×3): 325 mg via ORAL
  Filled 2012-05-07 (×4): qty 1

## 2012-05-07 MED ORDER — AMIODARONE HCL IN DEXTROSE 360-4.14 MG/200ML-% IV SOLN
60.0000 mg/h | INTRAVENOUS | Status: DC
  Filled 2012-05-07: qty 200

## 2012-05-07 MED ORDER — SODIUM CHLORIDE 0.9 % IV SOLN
100.0000 [IU] | INTRAVENOUS | Status: DC | PRN
  Administered 2012-05-07: 1.8 [IU]/h via INTRAVENOUS

## 2012-05-07 MED ORDER — METOPROLOL TARTRATE 12.5 MG HALF TABLET: 12.5000 mg | ORAL_TABLET | Freq: Once | ORAL | Status: DC

## 2012-05-07 MED ORDER — METOPROLOL TARTRATE 12.5 MG HALF TABLET
ORAL_TABLET | ORAL | Status: AC
  Filled 2012-05-07: qty 1

## 2012-05-07 MED ORDER — AMIODARONE HCL IN DEXTROSE 360-4.14 MG/200ML-% IV SOLN
1.0000 mg/min | INTRAVENOUS | Status: AC
  Administered 2012-05-07 – 2012-05-08 (×2): 1 mg/min via INTRAVENOUS
  Filled 2012-05-07: qty 200

## 2012-05-07 MED ORDER — DOPAMINE-DEXTROSE 1.6-5 MG/ML-% IV SOLN
INTRAVENOUS | Status: DC | PRN
  Administered 2012-05-07 (×2): 3 ug/kg/min via INTRAVENOUS

## 2012-05-07 MED ORDER — BISACODYL 10 MG RE SUPP: 10.0000 mg | Freq: Every day | RECTAL | Status: DC

## 2012-05-07 MED ORDER — METOPROLOL TARTRATE 25 MG/10 ML ORAL SUSPENSION
12.5000 mg | Freq: Two times a day (BID) | ORAL | Status: DC
  Filled 2012-05-07 (×3): qty 5

## 2012-05-07 MED ORDER — DEXMEDETOMIDINE HCL IN NACL 200 MCG/50ML IV SOLN
0.1000 ug/kg/h | INTRAVENOUS | Status: DC
  Filled 2012-05-07 (×3): qty 50

## 2012-05-07 MED ORDER — MIDAZOLAM HCL 2 MG/2ML IJ SOLN: 2.0000 mg | INTRAMUSCULAR | Status: DC | PRN

## 2012-05-07 MED ORDER — SODIUM CHLORIDE 0.9 % IV SOLN
250.0000 mL | INTRAVENOUS | Status: DC
  Administered 2012-05-08: 250 mL via INTRAVENOUS

## 2012-05-07 MED ORDER — OXYCODONE HCL 5 MG PO TABS
5.0000 mg | ORAL_TABLET | ORAL | Status: DC | PRN
  Administered 2012-05-08: 5 mg via ORAL
  Administered 2012-05-09 – 2012-05-11 (×9): 10 mg via ORAL
  Filled 2012-05-07 (×6): qty 2
  Filled 2012-05-07: qty 1
  Filled 2012-05-07 (×2): qty 2
  Filled 2012-05-07: qty 1
  Filled 2012-05-07: qty 2

## 2012-05-07 MED ORDER — FENTANYL CITRATE 0.05 MG/ML IJ SOLN
INTRAMUSCULAR | Status: DC | PRN
  Administered 2012-05-07: 500 ug via INTRAVENOUS
  Administered 2012-05-07: 250 ug via INTRAVENOUS
  Administered 2012-05-07: 100 ug via INTRAVENOUS
  Administered 2012-05-07: 150 ug via INTRAVENOUS
  Administered 2012-05-07: 1000 ug via INTRAVENOUS

## 2012-05-07 MED ORDER — PANTOPRAZOLE SODIUM 40 MG PO TBEC
40.0000 mg | DELAYED_RELEASE_TABLET | Freq: Every day | ORAL | Status: DC
  Administered 2012-05-09 – 2012-05-10 (×2): 40 mg via ORAL
  Filled 2012-05-07 (×2): qty 1

## 2012-05-07 MED ORDER — SODIUM CHLORIDE 0.9 % IJ SOLN: 3.0000 mL | INTRAMUSCULAR | Status: DC | PRN

## 2012-05-07 MED ORDER — VANCOMYCIN HCL IN DEXTROSE 1-5 GM/200ML-% IV SOLN
1000.0000 mg | Freq: Once | INTRAVENOUS | Status: AC
  Administered 2012-05-07: 1000 mg via INTRAVENOUS
  Filled 2012-05-07: qty 200

## 2012-05-07 MED ORDER — MORPHINE SULFATE 2 MG/ML IJ SOLN
2.0000 mg | INTRAMUSCULAR | Status: DC | PRN
  Administered 2012-05-07 (×2): 2 mg via INTRAVENOUS
  Administered 2012-05-08 (×6): 4 mg via INTRAVENOUS
  Administered 2012-05-08: 2 mg via INTRAVENOUS
  Administered 2012-05-08 (×3): 4 mg via INTRAVENOUS
  Administered 2012-05-09: 2 mg via INTRAVENOUS
  Filled 2012-05-07 (×2): qty 2
  Filled 2012-05-07: qty 1
  Filled 2012-05-07 (×7): qty 2
  Filled 2012-05-07: qty 1
  Filled 2012-05-07: qty 2

## 2012-05-07 MED ORDER — ACETAMINOPHEN 500 MG PO TABS
1000.0000 mg | ORAL_TABLET | Freq: Four times a day (QID) | ORAL | Status: DC
  Administered 2012-05-08 – 2012-05-11 (×12): 1000 mg via ORAL
  Filled 2012-05-07 (×17): qty 2

## 2012-05-07 MED ORDER — AMIODARONE HCL IN DEXTROSE 360-4.14 MG/200ML-% IV SOLN
INTRAVENOUS | Status: DC | PRN
  Administered 2012-05-07: 1 ug/min via INTRAVENOUS

## 2012-05-07 MED ORDER — FAMOTIDINE IN NACL 20-0.9 MG/50ML-% IV SOLN
20.0000 mg | Freq: Two times a day (BID) | INTRAVENOUS | Status: AC
  Administered 2012-05-07: 20 mg via INTRAVENOUS

## 2012-05-07 MED ORDER — ALBUMIN HUMAN 5 % IV SOLN
250.0000 mL | INTRAVENOUS | Status: AC | PRN
  Administered 2012-05-07 – 2012-05-08 (×4): 250 mL via INTRAVENOUS
  Filled 2012-05-07 (×2): qty 250

## 2012-05-07 SURGICAL SUPPLY — 153 items
ADAPTER CARDIO PERF ANTE/RETRO (ADAPTER) ×2 IMPLANT
ADPR PRFSN 84XANTGRD RTRGD (ADAPTER) ×1
APPLICATOR COTTON TIP 6IN STRL (MISCELLANEOUS) IMPLANT
ATTRACTOMAT 16X20 MAGNETIC DRP (DRAPES) ×2 IMPLANT
BAG DECANTER FOR FLEXI CONT (MISCELLANEOUS) ×2 IMPLANT
BANDAGE ELASTIC 4 VELCRO ST LF (GAUZE/BANDAGES/DRESSINGS) ×2 IMPLANT
BANDAGE ELASTIC 6 VELCRO ST LF (GAUZE/BANDAGES/DRESSINGS) ×2 IMPLANT
BANDAGE GAUZE ELAST BULKY 4 IN (GAUZE/BANDAGES/DRESSINGS) ×2 IMPLANT
BLADE STERNUM SYSTEM 6 (BLADE) ×2 IMPLANT
BLADE SURG 11 STRL SS (BLADE) ×1 IMPLANT
BLADE SURG 15 STRL LF DISP TIS (BLADE) ×1 IMPLANT
BLADE SURG 15 STRL SS (BLADE) ×2
BLADE SURG ROTATE 9660 (MISCELLANEOUS) ×1 IMPLANT
BOOT SUTURE AID YELLOW STND (SUTURE) ×2 IMPLANT
CANISTER SUCTION 2500CC (MISCELLANEOUS) ×2 IMPLANT
CANN PRFSN .5XCNCT 15X34-48 (MISCELLANEOUS) ×1
CANNULA AORTIC HI-FLOW 6.5M20F (CANNULA) ×2 IMPLANT
CANNULA GUNDRY RCSP 15FR (MISCELLANEOUS) ×1 IMPLANT
CANNULA PRFSN .5XCNCT 15X34-48 (MISCELLANEOUS) ×1 IMPLANT
CANNULA VEN 2 STAGE (MISCELLANEOUS) ×2
CANNULA VENOUS DUAL 32/40 (CANNULA) IMPLANT
CATH CPB KIT GERHARDT (MISCELLANEOUS) ×2 IMPLANT
CATH HEART VENT LEFT (CATHETERS) ×1 IMPLANT
CATH RETROPLEGIA CORONARY 14FR (CATHETERS) ×2 IMPLANT
CATH THORACIC 28FR (CATHETERS) ×2 IMPLANT
CATH THORACIC 36FR (CATHETERS) IMPLANT
CATH THORACIC 36FR RT ANG (CATHETERS) IMPLANT
CATH/SQUID NICHOLS JEHLE COR (CATHETERS) IMPLANT
CAUTERY EYE LOW TEMP 1300F FIN (OPHTHALMIC RELATED) ×2 IMPLANT
CLIP FOGARTY SPRING 6M (CLIP) IMPLANT
CLIP TI MEDIUM 24 (CLIP) IMPLANT
CLIP TI WIDE RED SMALL 24 (CLIP) IMPLANT
CLOTH BEACON ORANGE TIMEOUT ST (SAFETY) ×2 IMPLANT
CONN ST 1/4X3/8  BEN (MISCELLANEOUS) ×2
CONN ST 1/4X3/8 BEN (MISCELLANEOUS) IMPLANT
COVER SURGICAL LIGHT HANDLE (MISCELLANEOUS) ×4 IMPLANT
CRADLE DONUT ADULT HEAD (MISCELLANEOUS) ×2 IMPLANT
DRAIN CHANNEL 28F RND 3/8 FF (WOUND CARE) ×2 IMPLANT
DRAIN CHANNEL 32F RND 10.7 FF (WOUND CARE) IMPLANT
DRAPE CARDIOVASCULAR INCISE (DRAPES) ×2
DRAPE SLUSH MACHINE 52X66 (DRAPES) ×2 IMPLANT
DRAPE SLUSH/WARMER DISC (DRAPES) IMPLANT
DRAPE SRG 135X102X78XABS (DRAPES) ×1 IMPLANT
DRSG COVADERM 4X14 (GAUZE/BANDAGES/DRESSINGS) ×2 IMPLANT
ELECT BLADE 4.0 EZ CLEAN MEGAD (MISCELLANEOUS) ×2
ELECT CAUTERY BLADE 6.4 (BLADE) ×2 IMPLANT
ELECT REM PT RETURN 9FT ADLT (ELECTROSURGICAL) ×4
ELECTRODE BLDE 4.0 EZ CLN MEGD (MISCELLANEOUS) ×1 IMPLANT
ELECTRODE REM PT RTRN 9FT ADLT (ELECTROSURGICAL) ×2 IMPLANT
GLOVE BIO SURGEON STRL SZ 6 (GLOVE) IMPLANT
GLOVE BIO SURGEON STRL SZ 6.5 (GLOVE) ×6 IMPLANT
GLOVE BIO SURGEON STRL SZ7 (GLOVE) IMPLANT
GLOVE BIO SURGEON STRL SZ7.5 (GLOVE) IMPLANT
GLOVE BIOGEL PI IND STRL 6 (GLOVE) IMPLANT
GLOVE BIOGEL PI IND STRL 6.5 (GLOVE) IMPLANT
GLOVE BIOGEL PI IND STRL 7.0 (GLOVE) IMPLANT
GLOVE BIOGEL PI INDICATOR 6 (GLOVE)
GLOVE BIOGEL PI INDICATOR 6.5 (GLOVE) ×1
GLOVE BIOGEL PI INDICATOR 7.0 (GLOVE)
GLOVE EUDERMIC 7 POWDERFREE (GLOVE) IMPLANT
GLOVE ORTHO TXT STRL SZ7.5 (GLOVE) IMPLANT
GOWN STRL NON-REIN LRG LVL3 (GOWN DISPOSABLE) ×8 IMPLANT
GRAFT GELWEAVE VALSALVA 30 (Prosthesis & Implant Heart) IMPLANT
GRAFT GELWEAVE VALSALVA 30CM (Prosthesis & Implant Heart) ×2 IMPLANT
HEMOSTAT POWDER SURGIFOAM 1G (HEMOSTASIS) ×7 IMPLANT
HEMOSTAT SURGICEL 2X14 (HEMOSTASIS) ×4 IMPLANT
INSERT FOGARTY 61MM (MISCELLANEOUS) IMPLANT
INSERT FOGARTY XLG (MISCELLANEOUS) ×1 IMPLANT
KIT BASIN OR (CUSTOM PROCEDURE TRAY) ×2 IMPLANT
KIT PAIN CUSTOM (MISCELLANEOUS) IMPLANT
KIT ROOM TURNOVER OR (KITS) ×2 IMPLANT
KIT SUCTION CATH 14FR (SUCTIONS) ×4 IMPLANT
KIT VASOVIEW W/TROCAR VH 2000 (KITS) ×2 IMPLANT
LEAD PACING MYOCARDI (MISCELLANEOUS) ×2 IMPLANT
LINE VENT (MISCELLANEOUS) ×1 IMPLANT
MARKER GRAFT CORONARY BYPASS (MISCELLANEOUS) ×6 IMPLANT
NDL 18GX1X1/2 (RX/OR ONLY) (NEEDLE) ×1 IMPLANT
NEEDLE 18GX1X1/2 (RX/OR ONLY) (NEEDLE) ×2 IMPLANT
NS IRRIG 1000ML POUR BTL (IV SOLUTION) ×10 IMPLANT
PACK OPEN HEART (CUSTOM PROCEDURE TRAY) ×2 IMPLANT
PAD ARMBOARD 7.5X6 YLW CONV (MISCELLANEOUS) ×4 IMPLANT
PAD DEFIB STAT PADZ MULTI (MISCELLANEOUS) ×1 IMPLANT
PENCIL BUTTON HOLSTER BLD 10FT (ELECTRODE) ×2 IMPLANT
PUNCH AORTIC ROTATE 4.0MM (MISCELLANEOUS) IMPLANT
PUNCH AORTIC ROTATE 4.5MM 8IN (MISCELLANEOUS) IMPLANT
PUNCH AORTIC ROTATE 5MM 8IN (MISCELLANEOUS) IMPLANT
SEALANT SURG COSEAL 8ML (VASCULAR PRODUCTS) ×2 IMPLANT
SET CARDIOPLEGIA MPS 5001102 (MISCELLANEOUS) ×1 IMPLANT
SOLUTION ANTI FOG 6CC (MISCELLANEOUS) ×1 IMPLANT
SPONGE GAUZE 4X4 12PLY (GAUZE/BANDAGES/DRESSINGS) ×4 IMPLANT
SPONGE LAP 18X18 X RAY DECT (DISPOSABLE) ×4 IMPLANT
SPONGE LAP 4X18 X RAY DECT (DISPOSABLE) IMPLANT
STOPCOCK 4 WAY LG BORE MALE ST (IV SETS) ×1 IMPLANT
SUT BONE WAX W31G (SUTURE) ×2 IMPLANT
SUT ETHIBON 2 0 V 52N 30 (SUTURE) ×4 IMPLANT
SUT ETHIBON EXCEL 2-0 V-5 (SUTURE) IMPLANT
SUT ETHIBOND 2 0 SH (SUTURE) ×6
SUT ETHIBOND 2 0 SH 36X2 (SUTURE) IMPLANT
SUT ETHIBOND 2 0 V4 (SUTURE) ×2 IMPLANT
SUT ETHIBOND 2 0V4 GREEN (SUTURE) ×2 IMPLANT
SUT ETHIBOND V-5 VALVE (SUTURE) IMPLANT
SUT MNCRL AB 4-0 PS2 18 (SUTURE) IMPLANT
SUT PROLENE 3 0 RB 1 (SUTURE) ×1 IMPLANT
SUT PROLENE 3 0 SH 1 (SUTURE) ×3 IMPLANT
SUT PROLENE 3 0 SH DA (SUTURE) ×1 IMPLANT
SUT PROLENE 3 0 SH1 36 (SUTURE) ×7 IMPLANT
SUT PROLENE 4 0 RB 1 (SUTURE) ×42
SUT PROLENE 4 0 SH DA (SUTURE) ×2 IMPLANT
SUT PROLENE 4 0 TF (SUTURE) ×4 IMPLANT
SUT PROLENE 4-0 RB1 .5 CRCL 36 (SUTURE) IMPLANT
SUT PROLENE 5 0 C 1 36 (SUTURE) ×15 IMPLANT
SUT PROLENE 6 0 C 1 30 (SUTURE) IMPLANT
SUT PROLENE 6 0 CC (SUTURE) ×4 IMPLANT
SUT PROLENE 7 0 BV 1 (SUTURE) IMPLANT
SUT PROLENE 7 0 BV1 MDA (SUTURE) ×2 IMPLANT
SUT PROLENE 7.0 RB 3 (SUTURE) IMPLANT
SUT PROLENE 8 0 BV175 6 (SUTURE) IMPLANT
SUT SILK  1 MH (SUTURE) ×2
SUT SILK 1 MH (SUTURE) ×2 IMPLANT
SUT SILK 1 TIES 10X30 (SUTURE) ×2 IMPLANT
SUT SILK 2 0 (SUTURE) ×2
SUT SILK 2 0 SH CR/8 (SUTURE) ×5 IMPLANT
SUT SILK 2-0 18XBRD TIE 12 (SUTURE) ×1 IMPLANT
SUT SILK 3 0 SH CR/8 (SUTURE) ×3 IMPLANT
SUT SILK 4 0 (SUTURE) ×2
SUT SILK 4-0 18XBRD TIE 12 (SUTURE) ×1 IMPLANT
SUT STEEL 6MS V (SUTURE) ×3 IMPLANT
SUT STEEL STERNAL CCS#1 18IN (SUTURE) IMPLANT
SUT STEEL SZ 6 DBL 3X14 BALL (SUTURE) ×2 IMPLANT
SUT TEM PAC WIRE 2 0 SH (SUTURE) ×4 IMPLANT
SUT VIC AB 1 CTX 18 (SUTURE) ×6 IMPLANT
SUT VIC AB 1 CTX 36 (SUTURE)
SUT VIC AB 1 CTX36XBRD ANBCTR (SUTURE) IMPLANT
SUT VIC AB 2-0 CT1 27 (SUTURE)
SUT VIC AB 2-0 CT1 TAPERPNT 27 (SUTURE) IMPLANT
SUT VIC AB 2-0 CTX 27 (SUTURE) ×2 IMPLANT
SUT VIC AB 3-0 SH 27 (SUTURE)
SUT VIC AB 3-0 SH 27X BRD (SUTURE) IMPLANT
SUT VIC AB 3-0 X1 27 (SUTURE) ×3 IMPLANT
SUT VICRYL 4-0 PS2 18IN ABS (SUTURE) IMPLANT
SUTURE E-PAK OPEN HEART (SUTURE) ×2 IMPLANT
SYSTEM SAHARA CHEST DRAIN ATS (WOUND CARE) ×2 IMPLANT
TAPE CLOTH SOFT 2X10 (GAUZE/BANDAGES/DRESSINGS) ×1 IMPLANT
TAPES RETRACTO (MISCELLANEOUS) ×2 IMPLANT
TOWEL OR 17X24 6PK STRL BLUE (TOWEL DISPOSABLE) ×4 IMPLANT
TOWEL OR 17X26 10 PK STRL BLUE (TOWEL DISPOSABLE) ×4 IMPLANT
TRAY FOLEY IC TEMP SENS 14FR (CATHETERS) ×2 IMPLANT
TUBE FEEDING 8FR 16IN STR KANG (MISCELLANEOUS) ×2 IMPLANT
TUBE SUCT INTRACARD DLP 20F (MISCELLANEOUS) ×2 IMPLANT
TUBING INSUFFLATION 10FT LAP (TUBING) ×2 IMPLANT
UNDERPAD 30X30 INCONTINENT (UNDERPADS AND DIAPERS) ×2 IMPLANT
VENT LEFT HEART 12002 (CATHETERS) ×4
WATER STERILE IRR 1000ML POUR (IV SOLUTION) ×4 IMPLANT

## 2012-05-07 NOTE — Anesthesia Postprocedure Evaluation (Signed)
  Anesthesia Post-op Note  Patient: Jonathan Miles  Procedure(s) Performed: Procedure(s) (LRB): ASCENDING AORTIC ROOT REPLACEMENT (N/A) CORONARY ARTERY BYPASS GRAFTING (CABG) (N/A)  Patient Location: ICU  Anesthesia Type: General  Level of Consciousness: sedated, unresponsive and Patient remains intubated per anesthesia plan  Airway and Oxygen Therapy: Patient remains intubated per anesthesia plan  Post-op Pain: none  Post-op Assessment: Post-op Vital signs reviewed, Patient's Cardiovascular Status Stable and Respiratory Function Stable  Post-op Vital Signs: Reviewed and stable  Complications: No apparent anesthesia complications

## 2012-05-07 NOTE — H&P (Signed)
[address]            [place] [address]          [phone]        Jonathan Miles [facility] Medical Record #[id number] Date of Birth: [birth year]  Referring: Dr Excell Seltzer Primary Care: Jonathan Hoard, MD  Chief Complaint:    Dilated Aortic Root   History of Present Illness:     Patient returns today for followup visit he was originally seen 05/29/2011. He was initially referred because of intermittent numbness in his left arm and was referred for stress echo by his primary physician. Echocardiogram showed a dilated aortic root so ultimately he had a Cardiolite nuclear tests that showed no evidence of ischemia. MRI suggested a dilatation of his aortic root he had no evidence of aortic insufficiency. He does not know his family history as he is adopted. He notes since last seen he does occasionally have intermittent tingling in his left arm. He denies any chest pain shortness of breath or other symptoms of congestive heart failure.       Current Activity/ Functional Status: Patient is independent with mobility/ambulation, transfers, ADL's, IADL's.   Past Medical History  Diagnosis Date  . Hypertension   . Hyperlipidemia   . Diabetes mellitus     type 2  . Chest pain     Myoview 4/12: EF 62%, no ischemia or scar  . Aortic root dilatation     a. echo 4/12: EF 55-60%, mod to marked dilated ascending aortic root;   b. MRA 4/12: Aortic root 5.5 cm, dilation of left and right coronary cusps, trileaflet aortic valve  . PONV (postoperative nausea and vomiting)   . GERD (gastroesophageal reflux disease)     modifies with diet  . Arthritis   . Asthma     ast attack in early 20's  . OSA (obstructive sleep apnea)     .  Wears at times    Past Surgical History  Procedure Date  . Knee arthroscopy 2010    left  . Retactment of fingers     as a child , sewed with sewing machine- Index and middle finger  . Colonscopy   . Upper  gastrointestinal endoscopy     Family History  Problem Relation Age of Onset  . Adopted: Yes    History   Social History  . Marital Status: Married    Spouse Name: N/A    Number of Children: 0  . Years of Education: N/A   Occupational History  . 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 Smoking status: Former Smoker -- 1.0 packs/day for 13 years    Types: Cigarettes    Quit date: 01/23/1988  . Smokeless tobacco: Never Used  . Alcohol Use: No  . Drug Use: No  . Sexually Active: Not on file   Other Topics Concern  . Not on file   Social History Narrative   Married, lives in [place]. 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 Pt is adopted. Unsure of his family medical history.    History  Smoking status  . Former Smoker -- 1.0 packs/day for 13 years  . Types: Cigarettes  . Quit date: 01/23/1988  Smokeless tobacco  . Never Used    History  Alcohol Use No     Allergies  Allergen Reactions  .  Iodinated Diagnostic Agents Hives    Hives started 4 days after cta chest was performed, minimal relief w/ benadryl x 3 days, uncertain if reaction was actually iv contrast or other allergen,allergy tests to follow.wife will make [place] aware of results//[email or web address]    Current Facility-Administered Medications  Medication Dose Route Frequency Provider Last Rate Last Dose  . cefUROXime (ZINACEF) 1.5 g in dextrose 5 % 50 mL IVPB  1.5 g Intravenous To OR Delight Ovens, MD      . cefUROXime (ZINACEF) 750 mg in dextrose 5 % 50 mL IVPB  750 mg Intravenous To OR Delight Ovens, MD      . chlorhexidine (HIBICLENS) 4 % liquid 2 application  30 mL Topical UD Delight Ovens, MD      . dexmedetomidine (PRECEDEX) 400 mcg / 100 mL infusion  0.1-0.7 mcg/kg/hr Intravenous To OR Delight Ovens, MD      . DOPamine (INTROPIN) 800 mg in dextrose 5 % 250 mL infusion  2-20 mcg/kg/min Intravenous To OR Delight Ovens, MD      . EPINEPHrine (ADRENALIN)  4,000 mcg in dextrose 5 % 250 mL infusion  0.5-20 mcg/min Intravenous To OR Delight Ovens, MD      . insulin regular (NOVOLIN R,HUMULIN R) 1 Units/mL in sodium chloride 0.9 % 100 mL infusion   Intravenous To OR Delight Ovens, MD      . magnesium sulfate (IV Push/IM) injection 40 mEq  40 mEq Other To OR Delight Ovens, MD      . metoprolol tartrate (LOPRESSOR) 12.5 mg tablet           . metoprolol tartrate (LOPRESSOR) tablet 12.5 mg  12.5 mg Oral Once Delight Ovens, MD      . nitroGLYCERIN 0.2 mg/mL in dextrose 5 % infusion  2-200 mcg/min Intravenous To OR Delight Ovens, MD      . nitroglycerin-nicardipine-HEPARIN-sodium bicarbonate irrigation for artery spasm   Irrigation To OR Delight Ovens, MD      . phenylephrine (NEO-SYNEPHRINE) 20,000 mcg in dextrose 5 % 250 mL infusion  30-200 mcg/min Intravenous To OR Delight Ovens, MD      . potassium chloride injection 80 mEq  80 mEq Other To OR Delight Ovens, MD      . tranexamic acid (CYKLOKAPRON) bolus via infusion - over 30 minutes 2,070 mg  15 mg/kg Intravenous To OR Delight Ovens, MD      . tranexamic acid (CYKLOKAPRON) pump prime solution 276 mg  2 mg/kg Intracatheter To OR Delight Ovens, MD      . tranexamic acid (CYKLOLAPRON) 2,500 mg in sodium chloride 0.9 % 250 mL infusion  1.5 mg/kg/hr Intravenous To OR Delight Ovens, MD      . vancomycin (VANCOCIN) 1,500 mg in sodium chloride 0.9 % 250 mL IVPB  1,500 mg Intravenous To OR Delight Ovens, MD       Facility-Administered Medications Ordered in Other Encounters  Medication Dose Route Frequency Provider Last Rate Last Dose  . 0.9 %  sodium chloride infusion  250 mL Intravenous PRN Laurey Morale, MD      . sodium chloride 0.9 % injection 3 mL  3 mL Intravenous Q12H Laurey Morale, MD      . sodium chloride 0.9 % injection 3 mL  3 mL Intravenous PRN Laurey Morale, MD           Review of Systems:  Cardiac Review of Systems: Y or N  Chest  Pain [  n  ]  Resting SOB [ n  ] Exertional SOB  [ mild ]  Orthopnea [ n ]   Pedal Edema [ n  ]    Palpitations [ n ] Syncope  [ n ]   Presyncope [n   ]  General Review of Systems: [Y] = yes [  ]=no Constitional: recent weight change [ y ]; anorexia [  ]; fatigue [n  ]; nausea [n  ]; night sweats [  ]; fever [  ]; or chills [  ];                                                                                                                                          Dental: poor dentition[n  ]; Last Dentist visit: recent  Eye : blurred vision [  ]; diplopia [   ]; vision changes [ n ];  Amaurosis fugax[  n]; Resp: cough [ n ];  wheezing[ n ];  hemoptysis[ n ]; shortness of breath[ n ]; paroxysmal nocturnal dyspnea[ n ]; dyspnea on exertion[  ]; or orthopnea[  ];  GI:  gallstones[  ], vomiting[  ];  dysphagia[  ]; melena[  n];  hematochezia [ n ]; heartburn[  ];   Hx of  Colonoscopy[y  ]; GU: kidney stones [  ]; hematuria[  ];   dysuria [  ];  nocturia[  ];  history of     obstruction [  ];             Skin: rash, swelling[  ];, hair loss[  ];  peripheral edema[  ];  or itching[  ]; Musculosketetal: myalgias[  ];  joint swelling[  ];  joint erythema[  ];  joint pain[  ];  back pain[  ];  Heme/Lymph: bruising[n  ];  bleeding[  n];  anemia[ n ];  Neuro: TIA[  ];  headaches[  ];  stroke[  ];  vertigo[  ];  seizures[  ];   paresthesias[  ];  difficulty walking[  ];  Psych:depression[  ]; anxiety[  ];  Endocrine: diabetes[  ];  thyroid dysfunction[  ];  Immunizations: Flu [place].Etienne  ]; Pneumococcal[ y ];  Other:  Physical Exam: BP 135/98  Pulse 70  Temp 98.6 F (37 C) (Oral)  Resp 18  Wt 304 lb 3.8 oz (138 kg)  SpO2 95%  General appearance: alert, cooperative, appears stated age and no distress Neurologic: intact Heart: regular rate and rhythm, S1, S2 normal, no murmur, click, rub or gallop and normal apical impulse Lungs: clear to auscultation bilaterally and normal percussion bilaterally Abdomen:  soft, non-tender; bowel sounds normal; no masses,  no organomegaly Extremities: extremities normal, atraumatic, no cyanosis or edema, Homans sign is negative, no sign of DVT and no edema, redness or tenderness in the  calves or thighs No carotid bruits, full and equal carotid brachial femoral DP and PT pulses   Diagnostic Studies & Laboratory data:     Recent Radiology Findings:  RADIOLOGY REPORT*  Clinical Data: Aortic root dilatation, left arm numbness for  several weeks  CT ANGIOGRAPHY CHEST WITH CONTRAST  Technique: Multidetector CT imaging of the chest was performed  using the standard protocol during bolus administration of  intravenous contrast. Multiplanar CT image reconstructions  including MIPs were obtained to evaluate the vascular anatomy.  Contrast: OMNIPAQUE IOHEXOL 350 MG/ML IV SOLN  Comparison: MRA chest 02/12/2011  Findings:  Aneurysmal dilatation ascending thoracic aorta and aortic root.  Aortic root measures 6.1 x 5.1 cm in axial dimensions image 64,  immediately proximal to left main coronary artery origin.  At level of mid pulmonary artery, ascending aorta measures 4.2 x  4.1 cm image 52.  Aortic arch measures 3.1 cm transverse and 2.6 cm cranial-caudal.  Descending thoracic aorta at the level of the mid pulmonary artery  measures 2.8 x 2.7 cm.  No evidence of aortic dissection.  Few scattered coronary arterial calcifications noted.  Question left ventricular hypertrophy.  Calcified left hilar adenopathy and left lower lobe granuloma.  No additional thoracic adenopathy seen.  Numerous calcified granulomata within spleen.  Question tiny gallstone image 125.  Remaining visualized portions of upper abdomen unremarkable.  Remaining lungs clear.  No pleural effusion or pneumothorax.  No acute osseous findings.  Review of the MIP images confirms the above findings.  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  Old granulomatous disease.  Question cholelithiasis.  Original Report Authenticated By: Lollie Marrow, M.D.  Cardiac MRI RADIOLOGY REPORT*  Clinical Data: Aortic root dilation on transthoracic echo.  MRA CHEST WITH OR WITHOUT CONTRAST  GE 1.5 T magnet with dedicated cardiac coil. Limited FIESTA  sequences to assess the aortic valve. 20 mL Multihance contrast  was injected for MR angiography.  Contrast: 20 mL Multihance  Comparison: None.  Findings: The aortic valve was trileaflet with aneurysmal dilation  especially of the right and left coronary cusps. There was no  significant aortic stenosis or aortic regurgitation. The aortic  root was dilated to 5.5 cm at maximal dimension. The ascending  aorta was mildly enlarged to 3.8 cm. The remainder of the thoracic  aorta was not significantly dilated. The pulmonary veins drained  normally to the left atrium. The arch vessels originated normally.  Measurements:  Aortic root 5.5 cm  Ascending aorta at maximal dimension 3.8 cm  Aortic arch (proximal) 3.3 cm  Isthmus 2.5 cm  Descending thoracic aorta 2.7 cm  IMPRESSION:  1. Aneurysmal dilation of the aortic root to a maximum of 5.5 cm,  with dilation especially of the left and right coronary cusps.  2. The remainder of the thoracic aorta was not significantly  aneurysmal  3. Trileaflet aortic valve without significant stenosis or  regurgitation.  Original Report Authenticated By: [id number]    ECHO: Transthoracic Echocardiography  Patient: Jonathan Miles, Jonathan Miles MR #: [id number] Study Date: 11/11/2011 Gender: M Age: 51 Height: 193cm Weight: 135.2kg BSA: 2.26m^2 Pt. Status: Room:  ATTENDING Melina Modena, MD REFERRING [name], MD SONOGRAPHER Aida Raider, RDCS PERFORMING [facility], [facility] cc: Dr. Janace Miles  ------------------------------------------------------------ LV EF: 55% - 60%  ------------------------------------------------------------ Indications: Aneurysm - thoracic 441.2.  ------------------------------------------------------------  History: PMH: Acquired from the patient and from the patient's chart. PMH: Sleep apnea. Chest pain. Risk factors: Hypertension. Diabetes mellitus. Obese. Dyslipidemia.  ------------------------------------------------------------ Study Conclusions  - Left ventricle: The cavity size was normal. There was moderate concentric hypertrophy. Systolic function was normal. The estimated ejection fraction was in the range of 55% to 60%. Wall motion was normal; there were no regional wall motion abnormalities. - Aorta: Severe dilatation of ascending aortic root. Should have F/U chest CTA to size entire thoracic aorta - Left atrium: The atrium was mildly dilated. - Atrial septum: No defect or patent foramen ovale was identified. Transthoracic echocardiography. M-mode, complete 2D, spectral Doppler, and color Doppler. Height: Height: 193cm. Height: 76in. Weight: Weight: 135.2kg. Weight: 297.4lb. Body mass index: BMI: 36.3kg/m^2. Body surface area: BSA: 2.38m^2. Blood pressure: 167/105. Patient status: Outpatient. Location: [facility]  ------------------------------------------------------------  ------------------------------------------------------------ Left ventricle: The cavity size was normal. There was moderate concentric hypertrophy. Systolic function was normal. The estimated ejection fraction was in the range of 55% to 60%. Wall motion was normal; there were no regional wall motion abnormalities.  ------------------------------------------------------------ Aortic valve: Trileaflet; normal thickness leaflets. Mobility was not restricted. Doppler: Transvalvular velocity was within the normal range. There was no  stenosis. No regurgitation.  ------------------------------------------------------------ Aorta: Severe dilatation of ascending aortic root. Should have F/U chest CTA to size entire thoracic aorta  ------------------------------------------------------------ Mitral valve: Structurally normal valve. Mobility was not restricted. Doppler: Transvalvular velocity was within the normal range. There was no evidence for stenosis. No regurgitation. Peak gradient: 3mm Hg (D).  ------------------------------------------------------------ Left atrium: The atrium was mildly dilated.  ------------------------------------------------------------ Atrial septum: No defect or patent foramen ovale was identified.  ------------------------------------------------------------ Right ventricle: The cavity size was normal. Wall thickness was normal. Systolic function was normal.  ------------------------------------------------------------ Pulmonic valve: Doppler: Transvalvular velocity was within the normal range. There was no evidence for stenosis.  ------------------------------------------------------------ Tricuspid valve: Structurally normal valve. Doppler: Transvalvular velocity was within the normal range. Trivial regurgitation.  ------------------------------------------------------------ Pulmonary artery: The main pulmonary artery was normal-sized. Systolic pressure was within the normal range.  ------------------------------------------------------------ Right atrium: The atrium was normal in size.  ------------------------------------------------------------ Pericardium: The pericardium was normal in appearance. There was no pericardial effusion.  ------------------------------------------------------------ Systemic veins: Inferior vena cava: The vessel was normal in size.  ------------------------------------------------------------ Post procedure conclusions Ascending  Aorta:  - Severe dilatation of ascending aortic root. Should have F/U chest CTA to size entire thoracic aorta  ------------------------------------------------------------  2D measurements Normal Doppler measurements Normal Left ventricle Left ventricle LVID ED, 46.6 mm 43-52 Ea, lat 8.46 cm/s ------ chord, ann, tiss PLAX DP LVID ES, 26.3 mm 23-38 E/Ea, lat 9.52 ------ chord, ann, tiss PLAX DP FS, chord, 44 % >29 Ea, med 8.33 cm/s ------ PLAX ann, tiss LVPW, ED 15 mm ------ DP IVS/LVPW 1 <1.3 E/Ea, med 9.66 ------ ratio, ED ann, tiss Ventricular septum DP IVS, ED 15 mm ------ LVOT LVOT Peak vel, 107 cm/s ------ Diam, S 26 mm ------ S Area 5.31 cm^2 ------ VTI, S 24 cm ------ Diam 26 mm ------ Stroke vol 127.4 ml ------ Aorta Stroke 48.6 ml/m^ ------ Root diam, 53 mm ------ index 2 ED Mitral valve AAo AP 55 mm ------ Peak E vel 80.5 cm/s ------ diam, S Peak A vel 68.4 cm/s ------ Left atrium Decelerati 275 ms 150-23 AP dim 42 mm ------ on time 0 AP dim 1.6 cm/m^2 <2.2 Peak 3 mm Hg ------ index gradient, D Peak E/A 1.2 ------ ratio Right ventricle Sa  vel, 15.7 cm/s ------ lat ann, tiss DP  ------------------------------------------------------------ Prepared and Electronically Authenticated by  Charlton Haws 2013-01-15T10:40:00.310   CARDIAC CATH Cardiac Catheterization Procedure Note  Name: Jonathan Miles  MRN: [id number]  DOB: [birth year]  Procedure: Selective Coronary Angiography, Aortic root angiography  Indication: Aortic root aneurysm, pre-repair.  Procedural Details: Allen's testing on the right wrist was positive. The right wrist was prepped, draped, and anesthetized with 1% lidocaine. Using the modified Seldinger technique, a 5 French sheath was introduced into the right radial artery. 3 mg of verapamil was administered through the sheath, weight-based unfractionated heparin was administered intravenously. Standard Judkins catheters were used for  selective coronary angiography and aortic root angiography. Catheter exchanges were performed over an exchange length guidewire. There were no immediate procedural complications. A TR band was used for radial hemostasis at the completion of the procedure. The patient was transferred to the post catheterization recovery area for further monitoring.  Procedural Findings:  Hemodynamics:  AO 122/84  Coronary angiography:  Coronary dominance: right  Left mainstem: Very short vessel with trifurcation into LAD, ramus, circumflex.  Left anterior descending (LAD): Mild ectasia. Diffuse mild luminal irregularities. Moderate 2nd diagonal with long 50% stenosis.  Left circumflex (LCx): Moderate ramus with luminal irregularities. LCx is a large vessel with luminal irregularities.  Right coronary artery (RCA): Dominent vessel with ectasia. 60% proximal RCA stenosis at a bend in the vessel. 50% mid RCA stenosis.  Aortic root angiography: Enlarged aortic root and ascending aorta consistent with prior imaging.  Final Conclusions: Ectatic, diffusely diseased coronaries. Most significant disease was in the RCA where there was a 60% proximal vessel stenosis and a 50% mid vessel stenosis. There was also a long 50% stenosis in a moderate 2nd diagonal.  Recommendations: Will discuss with Dr. Tyrone Sage. I do not think he has flow-limiting coronary disease but would consider grafting at least the RCA at time of aortic root repair. Will need aggressive treatment for CAD risk. Goal LDL < 70.  [name]  11/14/2011, 12:20 PM         Recent Lab Findings: Lab Results  Component Value Date   WBC 8.6 05/05/2012   HGB 14.3 05/05/2012   HCT 40.5 05/05/2012   PLT 244 05/05/2012   GLUCOSE 109* 05/05/2012   CHOL 197 11/25/2011   TRIG 111.0 11/25/2011   HDL 42.20 11/25/2011   LDLCALC 133* 11/25/2011   ALT 20 05/05/2012   AST 13 05/05/2012   NA 141 05/05/2012   K 3.8 05/05/2012   CL 103 05/05/2012   CREATININE 1.04 05/05/2012    BUN 18 05/05/2012   CO2 28 05/05/2012   INR 1.09 05/05/2012   HGBA1C 6.1* 05/05/2012      Assessment / Plan:      The patient returns today for  operative repair of his dilated aortic root now noted to be size compatible with elective repair. He has no physical signs or echo evidence of aortic insufficiency, imaging indicates that he has a trileaflet valve. I've recommended aortic root replacement to  the patient because of the risk of spontaneous aortic dissection. We discussed valve sparing aortic root replacement and also use of prosthetic valve in preserving his own valve is not feasible. The patient has no direct contraindication to the use of Coumadin, he does understand that a mechanical prosthesis would require lifelong anticoagulation. The risk of surgery and the risks of continued medical therapy are reviewed with the patient and his wife in detail. He is at this  point willing to proceed with surgery.  The goals risks and alternatives of the planned surgical procedure aortic root replacement avr cabg have been discussed with the patient in detail. The risks of the procedure including death, infection, stroke, myocardial infarction, bleeding, blood transfusion have all been discussed specifically.  I have quoted Jonathan Miles a 5% of perioperative mortality and a complication rate as high as 25 %. The patient's questions have been answered.Jonathan Miles is willing  to proceed with the planned procedure.   Delight Ovens MD  Beeper [phone] Office [phone] 05/07/2012 7:26 AM

## 2012-05-07 NOTE — Progress Notes (Signed)
  Echocardiogram Echocardiogram Transesophageal has been performed.  KENNEY, GOING 05/07/2012, 8:33 AM

## 2012-05-07 NOTE — Transfer of Care (Signed)
Immediate Anesthesia Transfer of Care Note  Patient: Jonathan Miles  Procedure(s) Performed: Procedure(s) (LRB): ASCENDING AORTIC ROOT REPLACEMENT (N/A) CORONARY ARTERY BYPASS GRAFTING (CABG) (N/A)  Patient Location: PACU and SICU  Anesthesia Type: General  Level of Consciousness: Patient remains intubated per anesthesia plan  Airway & Oxygen Therapy: Patient remains intubated per anesthesia plan and Patient placed on Ventilator (see vital sign flow sheet for setting)  Post-op Assessment: Report given to PACU RN and Post -op Vital signs reviewed and stable  Post vital signs: Reviewed and stable  Complications: No apparent anesthesia complications

## 2012-05-07 NOTE — Preoperative (Signed)
Beta Blockers   Reason not to administer Beta Blockers:Not Applicable 

## 2012-05-07 NOTE — Anesthesia Preprocedure Evaluation (Addendum)
Anesthesia Evaluation   Patient awake    Reviewed: Allergy & Precautions, H&P , NPO status , Patient's Chart, lab work & pertinent test results, reviewed documented beta blocker date and time   History of Anesthesia Complications (+) PONV  Airway Mallampati: II TM Distance: >3 FB Neck ROM: Full    Dental   Pulmonary sleep apnea , former smoker,          Cardiovascular hypertension, Pt. on medications and Pt. on home beta blockers + CAD     Neuro/Psych    GI/Hepatic GERD-  Controlled,  Endo/Other    Renal/GU      Musculoskeletal   Abdominal   Peds  Hematology   Anesthesia Other Findings   Reproductive/Obstetrics                          Anesthesia Physical Anesthesia Plan  ASA: III  Anesthesia Plan: General   Post-op Pain Management:    Induction: Intravenous  Airway Management Planned: Oral ETT  Additional Equipment: Arterial line, CVP, PA Cath and TEE  Intra-op Plan:   Post-operative Plan: Post-operative intubation/ventilation  Informed Consent: I have reviewed the patients History and Physical, chart, labs and discussed the procedure including the risks, benefits and alternatives for the proposed anesthesia with the patient or authorized representative who has indicated his/her understanding and acceptance.     Plan Discussed with: CRNA and Surgeon  Anesthesia Plan Comments:         Anesthesia Quick Evaluation

## 2012-05-07 NOTE — Progress Notes (Signed)
Patient ID: Jonathan Miles, male   DOB: 07/29/1961, 51 y.o.   MRN: [id number]  Filed Vitals:   05/06/12 1300 05/07/12 0543 05/07/12 1855  BP:  135/98   Pulse:  70 88  Temp:  98.6 F (37 C)   TempSrc:  Oral   Resp:  18 15  Weight: 138 kg (304 lb 3.8 oz)    SpO2:  95% 97%   CI= 1.6 on dop 5.  Excellent urine output CT output low.  Starting to arouse a little on vent  BMET    Component Value Date/Time   NA 149* 05/07/2012 1734   K 3.4* 05/07/2012 1734   CL 103 05/05/2012 1459   CO2 28 05/05/2012 1459   GLUCOSE 128* 05/07/2012 1734   BUN 18 05/05/2012 1459   CREATININE 1.04 05/05/2012 1459   CALCIUM 9.6 05/05/2012 1459   GFRNONAA 81* 05/05/2012 1459   GFRAA >90 05/05/2012 1459    CBC    Component Value Date/Time   WBC 17.5* 05/07/2012 1900   RBC 4.51 05/07/2012 1900   HGB 13.3 05/07/2012 1900   HCT 37.2* 05/07/2012 1900   PLT 127* 05/07/2012 1900   MCV 82.5 05/07/2012 1900   MCH 29.5 05/07/2012 1900   MCHC 35.8 05/07/2012 1900   RDW 13.6 05/07/2012 1900   LYMPHSABS 1.5 11/11/2011 1010   MONOABS 0.8 11/11/2011 1010   EOSABS 0.3 11/11/2011 1010   BASOSABS 0.1 11/11/2011 1010    ABG ok.  A/P: stable postop course.

## 2012-05-07 NOTE — Procedures (Signed)
Extubation Procedure Note  Patient Details:   Name: Jonathan Miles DOB: 09/28/1961 MRN: [id number]   Airway Documentation:  Airway 8 mm (Active)  Secured at (cm) 23 cm 05/07/2012  8:10 PM  Measured From Lips 05/07/2012  8:10 PM  Secured Location Right 05/07/2012  8:10 PM  Secured By Pink Tape 05/07/2012  8:10 PM    Evaluation  O2 sats: 96% Complications: None Patient did tolerate procedure well. Bilateral Breath Sounds: Rhonchi   Pt. Able to follow commands.  NIF -22, VC 950. Cuff leak positive. Pt. Extubated to 4 lpm Kay[place]. Vitals Stable. Pt. Able to vocalize post extubation.  Clent Ridges 05/07/2012, 10:12 PM

## 2012-05-07 NOTE — Brief Op Note (Addendum)
05/07/2012  2:42 PM  PATIENT:  Jonathan Miles  51 y.o. male  PRE-OPERATIVE DIAGNOSIS:  ASCENDING AORTIC ROOT ANEURYSM  POST-OPERATIVE DIAGNOSIS:  ASCENDING AORTIC ROOT ANEURYSM, coronary artery disease  PROCEDURE:  Procedure(s) (LRB):  AORTIC VALVE SPARING ASCENDING AORTIC ROOT REPLACEMENT  -30mm Hemashield graft  CORONARY ARTERY BYPASS GRAFTING X1  SVG TO RCA  ENDOSCOPIC SAPHENOUS VEIN HARVEST RIGHT THIGH  REPAIR OF PATENT FORAMEN OVALE (N/A)  SURGEON:  Surgeon(s) and Role:    * Delight Ovens, MD - Primary  Assistant: Dr Kathlee Nations Trigt MD  PHYSICIAN ASSISTANT: Lowella Dandy PA-C  ANESTHESIA:   general  EBL:  Total I/O In: -  Out: 795 [Urine:795]  BLOOD ADMINISTERED: CC CELLSAVER  DRAINS: medistinal chest drains x 2    SPECIMEN:  Source of Specimen:  Ascending Aortic Aneurysm  DISPOSITION OF SPECIMEN:  PATHOLOGY  COUNTS:  YES   DICTATION: .Dragon Dictation  PLAN OF CARE: Admit to inpatient   PATIENT DISPOSITION:  ICU - intubated and hemodynamically stable.   Delay start of Pharmacological VTE agent (>24hrs) due to surgical blood loss or risk of bleeding: yes

## 2012-05-07 NOTE — Progress Notes (Signed)
Pharmacy Consult: Drug-Drug Interactions with Amiodarone  Amiodarone is metabolized by the cytochrome P450 system and therefore has the potential to cause many drug interactions. Amiodarone has an average plasma half-life of 50 days (range 20 to 100 days).  There is potential for drug interactions to occur several weeks or months after stopping treatment and the onset of drug interactions may be slow after initiating amiodarone.  [X]  Statins: Increased risk of myopathy (Max Simvastatin dose is 20mg  daily) --> Patient is on Simvastatin 40mg  at home. To reduce risk, Zocor has been changed to Lipitor 20mg  while in hospital. Please address at discharge Other statins: counsel patients to report any muscle pain or weakness immediately and monitor LFTs   [ ]  Anticoagulants: Amiodarone can increase anticoagulant effect. Consider warfarin dose reduction. Patients should be monitored closely and the dose of anticoagulant altered accordingly, remembering that amiodarone levels take several weeks to stabilize.  [ ]  Antiepileptics: Amiodarone can increase plasma concentration of phenytoin, phenytoin dose should be reduced. Note that small changes in phenytoin dose can result in large changes in phenytoin levels. Monitor patient closely and counsel on signs of toxicity.  [X]  Beta blockers: increased risk of bradycardia, AV block and myocardial depression. Sotalol - avoid concomitant use.  [ ]  Calcium channel blockers (diltiazem and verapamil): increased risk of bradycardia, AV block and myocardial depression.  [ ]  Cyclosporine: Amiodarone increases levels of cyclosporine. Reduced dose of cyclosporine is recommended.  [ ]  Digoxin dose should be halved when amiodarone is started.  []  Diuretics: increased risk of cardiotoxicity if hypokalemia occurs.  [ ]  Oral hypoglycemic agents (glyburide, glipizide, glimepiride): increased risk of hypoglycemia. Patient's glucose levels should be monitored closely when initiating  amiodarone therapy.  []  Drugs that prolong the QT interval: Concurrent therapy is contraindicated due to the increased risk of torsades de pointes;  . Antibiotics: e.g. fluoroquinolones, erythromycin.  . Antiarrhythmics: e.g. quinidine, procainamide, disopyramide, sotalol.  . Antipsychotics: e.g. phenothiazines, haloperidol.  . Lithium, tricyclic antidepressants, and methadone.  Cleon Dew, PharmD [phone] 05/07/2012, 9:49 PM    .

## 2012-05-08 ENCOUNTER — Inpatient Hospital Stay (HOSPITAL_COMMUNITY): Payer: BC Managed Care – PPO

## 2012-05-08 LAB — GLUCOSE, CAPILLARY
Glucose-Capillary: 104 mg/dL — ABNORMAL HIGH (ref 70–99)
Glucose-Capillary: 110 mg/dL — ABNORMAL HIGH (ref 70–99)
Glucose-Capillary: 114 mg/dL — ABNORMAL HIGH (ref 70–99)
Glucose-Capillary: 114 mg/dL — ABNORMAL HIGH (ref 70–99)
Glucose-Capillary: 117 mg/dL — ABNORMAL HIGH (ref 70–99)
Glucose-Capillary: 120 mg/dL — ABNORMAL HIGH (ref 70–99)
Glucose-Capillary: 121 mg/dL — ABNORMAL HIGH (ref 70–99)
Glucose-Capillary: 123 mg/dL — ABNORMAL HIGH (ref 70–99)
Glucose-Capillary: 124 mg/dL — ABNORMAL HIGH (ref 70–99)
Glucose-Capillary: 127 mg/dL — ABNORMAL HIGH (ref 70–99)
Glucose-Capillary: 134 mg/dL — ABNORMAL HIGH (ref 70–99)
Glucose-Capillary: 135 mg/dL — ABNORMAL HIGH (ref 70–99)
Glucose-Capillary: 135 mg/dL — ABNORMAL HIGH (ref 70–99)
Glucose-Capillary: 144 mg/dL — ABNORMAL HIGH (ref 70–99)
Glucose-Capillary: 89 mg/dL (ref 70–99)
Glucose-Capillary: 90 mg/dL (ref 70–99)
Glucose-Capillary: 93 mg/dL (ref 70–99)

## 2012-05-08 LAB — CBC
HCT: 31.3 % — ABNORMAL LOW (ref 39.0–52.0)
HCT: 30.7 % — ABNORMAL LOW (ref 39.0–52.0)
Hemoglobin: 11 g/dL — ABNORMAL LOW (ref 13.0–17.0)
Hemoglobin: 10.7 g/dL — ABNORMAL LOW (ref 13.0–17.0)
MCH: 29.2 pg (ref 26.0–34.0)
MCH: 29.5 pg (ref 26.0–34.0)
MCHC: 35.1 g/dL (ref 30.0–36.0)
MCHC: 34.9 g/dL (ref 30.0–36.0)
MCV: 83 fL (ref 78.0–100.0)
MCV: 84.6 fL (ref 78.0–100.0)
Platelets: 114 10*3/uL — ABNORMAL LOW (ref 150–400)
Platelets: 122 10*3/uL — ABNORMAL LOW (ref 150–400)
RBC: 3.77 MIL/uL — ABNORMAL LOW (ref 4.22–5.81)
RBC: 3.63 MIL/uL — ABNORMAL LOW (ref 4.22–5.81)
RDW: 13.8 % (ref 11.5–15.5)
RDW: 14.3 % (ref 11.5–15.5)
WBC: 15.6 10*3/uL — ABNORMAL HIGH (ref 4.0–10.5)
WBC: 18.4 10*3/uL — ABNORMAL HIGH (ref 4.0–10.5)

## 2012-05-08 LAB — BASIC METABOLIC PANEL
BUN: 12 mg/dL (ref 6–23)
CO2: 25 mEq/L (ref 19–32)
Calcium: 7.7 mg/dL — ABNORMAL LOW (ref 8.4–10.5)
Chloride: 110 mEq/L (ref 96–112)
Creatinine, Ser: 0.64 mg/dL (ref 0.50–1.35)
GFR calc Af Amer: >90 mL/min (ref >90)
GFR calc non Af Amer: >90 mL/min (ref >90)
Glucose, Bld: 117 mg/dL — ABNORMAL HIGH (ref 70–99)
Potassium: 3.8 mEq/L (ref 3.5–5.1)
Sodium: 141 mEq/L (ref 135–145)

## 2012-05-08 LAB — POCT I-STAT, CHEM 8
BUN: 16 mg/dL (ref 6–23)
Calcium, Ion: 1.2 mmol/L (ref 1.12–1.23)
Chloride: 106 mEq/L (ref 96–112)
Creatinine, Ser: 1 mg/dL (ref 0.50–1.35)
Glucose, Bld: 137 mg/dL — ABNORMAL HIGH (ref 70–99)
HCT: 28 % — ABNORMAL LOW (ref 39.0–52.0)
Hemoglobin: 9.5 g/dL — ABNORMAL LOW (ref 13.0–17.0)
Potassium: 4.1 mEq/L (ref 3.5–5.1)
Sodium: 139 mEq/L (ref 135–145)
TCO2: 24 mmol/L (ref 0–100)

## 2012-05-08 LAB — CREATININE, SERUM
Creatinine, Ser: 0.81 mg/dL (ref 0.50–1.35)
GFR calc Af Amer: >90 mL/min (ref >90)
GFR calc non Af Amer: >90 mL/min (ref >90)

## 2012-05-08 LAB — MAGNESIUM
Magnesium: 2.4 mg/dL (ref 1.5–2.5)
Magnesium: 2.4 mg/dL (ref 1.5–2.5)

## 2012-05-08 MED ORDER — INSULIN GLARGINE 100 UNIT/ML ~~LOC~~ SOLN
30.0000 [IU] | Freq: Every day | SUBCUTANEOUS | Status: DC
  Administered 2012-05-08 – 2012-05-09 (×2): 30 [IU] via SUBCUTANEOUS

## 2012-05-08 MED ORDER — INSULIN ASPART 100 UNIT/ML ~~LOC~~ SOLN
0.0000 [IU] | SUBCUTANEOUS | Status: DC
  Administered 2012-05-08: 2 [IU] via SUBCUTANEOUS
  Administered 2012-05-09: 4 [IU] via SUBCUTANEOUS

## 2012-05-08 MED ORDER — ENOXAPARIN SODIUM 40 MG/0.4ML ~~LOC~~ SOLN
40.0000 mg | SUBCUTANEOUS | Status: DC
  Administered 2012-05-08 – 2012-05-09 (×2): 40 mg via SUBCUTANEOUS
  Filled 2012-05-08 (×3): qty 0.4

## 2012-05-08 NOTE — Plan of Care (Signed)
Problem: Phase I Progression Outcomes Goal: Patient status is OR emergent or Short Stay Outcome: Completed/Met Date Met:  05/08/12 Pt status is short stay.

## 2012-05-08 NOTE — Progress Notes (Signed)
Patient ID: Jonathan Miles, male   DOB: [birth year], 51 y.o.   MRN: [id number]  Filed Vitals:   05/08/12 1830 05/08/12 1900 05/08/12 1930 05/08/12 1944  BP:  92/50    Pulse: 82 81 86   Temp:    98.3 F (36.8 C)  TempSrc:    Oral  Resp: 15 0 6   Height:      Weight:      SpO2: 91% 94% 94%    Dopamine 4 Neo is off  Urine output ok  CBC    Component Value Date/Time   WBC 18.4* 05/08/2012 1720   RBC 3.63* 05/08/2012 1720   HGB 9.5* 05/08/2012 1726   HCT 28.0* 05/08/2012 1726   PLT 122* 05/08/2012 1720   MCV 84.6 05/08/2012 1720   MCH 29.5 05/08/2012 1720   MCHC 34.9 05/08/2012 1720   RDW 14.3 05/08/2012 1720   LYMPHSABS 1.5 11/11/2011 1010   MONOABS 0.8 11/11/2011 1010   EOSABS 0.3 11/11/2011 1010   BASOSABS 0.1 11/11/2011 1010    BMET    Component Value Date/Time   NA 139 05/08/2012 1726   K 4.1 05/08/2012 1726   CL 106 05/08/2012 1726   CO2 25 05/08/2012 0400   GLUCOSE 137* 05/08/2012 1726   BUN 16 05/08/2012 1726   CREATININE 1.00 05/08/2012 1726   CALCIUM 7.7* 05/08/2012 0400   GFRNONAA >90 05/08/2012 1720   GFRAA >90 05/08/2012 1720    A/P: stable.  Wean dopamine as BP allows

## 2012-05-08 NOTE — Progress Notes (Signed)
1 Day Post-Op Procedure(s) (LRB): ASCENDING AORTIC ROOT REPLACEMENT (N/A) CORONARY ARTERY BYPASS GRAFTING (CABG) (N/A) Subjective: No complaints  Objective: Vital signs in last 24 hours: Temp:  [98.1 F (36.7 C)-99.6 F (37.6 C)] 99.6 F (37.6 C) (07/13 0733) Pulse Rate:  [64-88] 78  (07/13 1000) Cardiac Rhythm:  [-] Heart block (07/13 0900) Resp:  [0-21] 21  (07/13 1000) BP: (93-152)/(45-74) 97/52 mmHg (07/13 1000) SpO2:  [96 %-100 %] 98 % (07/13 1000) FiO2 (%):  [40 %-50 %] 40 % (07/12 2125) Weight:  [138 kg (304 lb 3.8 oz)-142.2 kg (313 lb 7.9 oz)] 142.2 kg (313 lb 7.9 oz) (07/13 0600)  Hemodynamic parameters for last 24 hours: PAP: (22-33)/(9-21) 24/10 mmHg CO:  [3 L/min-5.9 L/min] 5.5 L/min CI:  [1.2 L/min/m2-2.3 L/min/m2] 2.1 L/min/m2  Intake/Output from previous day: 07/12 0701 - 07/13 0700 In: 9436.3 [I.V.:5432.3; Blood:1900; IV Piggyback:2104] Out: 3625 [Urine:3305; Chest Tube:320] Intake/Output this shift: Total I/O In: 243.1 [I.V.:243.1] Out: 545 [Urine:435; Chest Tube:110]  General appearance: alert and cooperative Neurologic: intact Heart: regular rate and rhythm, S1, S2 normal, no murmur, click, rub or gallop Lungs: clear to auscultation bilaterally Extremities: edema mild Wound: incisions ok  Lab Results:  Basename 05/08/12 0400 05/07/12 1908 05/07/12 1900  WBC 15.6* -- 17.5*  HGB 11.0* 12.9* --  HCT 31.3* 38.0* --  PLT 114* -- 127*   BMET:  Basename 05/08/12 0400 05/07/12 1908 05/05/12 1459  NA 141 145 --  K 3.8 3.7 --  CL 110 -- 103  CO2 25 -- 28  GLUCOSE 117* 96 --  BUN 12 -- 18  CREATININE 0.64 -- 1.04  CALCIUM 7.7* -- 9.6    PT/INR:  Basename 05/07/12 1900  LABPROT 18.8*  INR 1.54*   ABG    Component Value Date/Time   PHART 7.338* 05/07/2012 2150   HCO3 23.1 05/07/2012 2150   TCO2 24 05/07/2012 2150   ACIDBASEDEF 3.0* 05/07/2012 2150   O2SAT 96.0 05/07/2012 2150   CBG (last 3)   Basename 05/08/12 1048 05/08/12 0917 05/08/12  0739  GLUCAP 134* 104* 89    Assessment/Plan: S/P Procedure(s) (LRB): ASCENDING AORTIC ROOT REPLACEMENT (N/A) CORONARY ARTERY BYPASS GRAFTING (CABG) (N/A) Mobilize Diuresis when weaned off vasopressors No further arrhythmias on amiodarone d/c tubes/lines Continue foley due to diuresing patient, patient in ICU and urinary output monitoring See progression orders   LOS: 1 day    BARTLE,BRYAN K 05/08/2012

## 2012-05-09 ENCOUNTER — Inpatient Hospital Stay (HOSPITAL_COMMUNITY): Payer: BC Managed Care – PPO

## 2012-05-09 LAB — GLUCOSE, CAPILLARY
Glucose-Capillary: 100 mg/dL — ABNORMAL HIGH (ref 70–99)
Glucose-Capillary: 107 mg/dL — ABNORMAL HIGH (ref 70–99)
Glucose-Capillary: 109 mg/dL — ABNORMAL HIGH (ref 70–99)
Glucose-Capillary: 110 mg/dL — ABNORMAL HIGH (ref 70–99)
Glucose-Capillary: 115 mg/dL — ABNORMAL HIGH (ref 70–99)
Glucose-Capillary: 116 mg/dL — ABNORMAL HIGH (ref 70–99)
Glucose-Capillary: 124 mg/dL — ABNORMAL HIGH (ref 70–99)
Glucose-Capillary: 129 mg/dL — ABNORMAL HIGH (ref 70–99)
Glucose-Capillary: 131 mg/dL — ABNORMAL HIGH (ref 70–99)
Glucose-Capillary: 137 mg/dL — ABNORMAL HIGH (ref 70–99)
Glucose-Capillary: 139 mg/dL — ABNORMAL HIGH (ref 70–99)
Glucose-Capillary: 139 mg/dL — ABNORMAL HIGH (ref 70–99)
Glucose-Capillary: 141 mg/dL — ABNORMAL HIGH (ref 70–99)
Glucose-Capillary: 142 mg/dL — ABNORMAL HIGH (ref 70–99)
Glucose-Capillary: 144 mg/dL — ABNORMAL HIGH (ref 70–99)
Glucose-Capillary: 146 mg/dL — ABNORMAL HIGH (ref 70–99)
Glucose-Capillary: 159 mg/dL — ABNORMAL HIGH (ref 70–99)
Glucose-Capillary: 182 mg/dL — ABNORMAL HIGH (ref 70–99)
Glucose-Capillary: 194 mg/dL — ABNORMAL HIGH (ref 70–99)
Glucose-Capillary: 83 mg/dL (ref 70–99)
Glucose-Capillary: 92 mg/dL (ref 70–99)

## 2012-05-09 LAB — CBC
HCT: 29.5 % — ABNORMAL LOW (ref 39.0–52.0)
Hemoglobin: 10.1 g/dL — ABNORMAL LOW (ref 13.0–17.0)
MCH: 29 pg (ref 26.0–34.0)
MCHC: 34.2 g/dL (ref 30.0–36.0)
MCV: 84.8 fL (ref 78.0–100.0)
Platelets: 130 10*3/uL — ABNORMAL LOW (ref 150–400)
RBC: 3.48 MIL/uL — ABNORMAL LOW (ref 4.22–5.81)
RDW: 14.5 % (ref 11.5–15.5)
WBC: 18.1 10*3/uL — ABNORMAL HIGH (ref 4.0–10.5)

## 2012-05-09 LAB — BASIC METABOLIC PANEL
BUN: 20 mg/dL (ref 6–23)
CO2: 28 mEq/L (ref 19–32)
Calcium: 8.1 mg/dL — ABNORMAL LOW (ref 8.4–10.5)
Chloride: 105 mEq/L (ref 96–112)
Creatinine, Ser: 0.88 mg/dL (ref 0.50–1.35)
GFR calc Af Amer: >90 mL/min (ref >90)
GFR calc non Af Amer: >90 mL/min (ref >90)
Glucose, Bld: 155 mg/dL — ABNORMAL HIGH (ref 70–99)
Potassium: 4.2 mEq/L (ref 3.5–5.1)
Sodium: 138 mEq/L (ref 135–145)

## 2012-05-09 MED ORDER — CHLORHEXIDINE GLUCONATE CLOTH 2 % EX PADS
6.0000 | MEDICATED_PAD | Freq: Every day | CUTANEOUS | Status: DC
  Administered 2012-05-09 – 2012-05-10 (×2): 6 via TOPICAL

## 2012-05-09 MED ORDER — DEXTROSE 50 % IV SOLN: 25.0000 mL | INTRAVENOUS | Status: DC | PRN

## 2012-05-09 MED ORDER — INSULIN GLARGINE 100 UNIT/ML ~~LOC~~ SOLN
30.0000 [IU] | Freq: Two times a day (BID) | SUBCUTANEOUS | Status: DC
  Administered 2012-05-09 – 2012-05-10 (×3): 30 [IU] via SUBCUTANEOUS

## 2012-05-09 MED ORDER — SODIUM CHLORIDE 0.45 % IV SOLN: INTRAVENOUS | Status: DC

## 2012-05-09 MED ORDER — DEXTROSE-NACL 5-0.45 % IV SOLN: INTRAVENOUS | Status: DC

## 2012-05-09 MED ORDER — INSULIN REGULAR HUMAN 100 UNIT/ML IJ SOLN
INTRAMUSCULAR | Status: DC
  Administered 2012-05-09: 1.2 [IU]/h via INTRAVENOUS
  Filled 2012-05-09: qty 1

## 2012-05-09 MED ORDER — METOCLOPRAMIDE HCL 10 MG PO TABS
10.0000 mg | ORAL_TABLET | Freq: Three times a day (TID) | ORAL | Status: DC
  Administered 2012-05-09 – 2012-05-11 (×7): 10 mg via ORAL
  Filled 2012-05-09 (×11): qty 1

## 2012-05-09 MED ORDER — MUPIROCIN 2 % EX OINT
1.0000 "application " | TOPICAL_OINTMENT | Freq: Two times a day (BID) | CUTANEOUS | Status: AC
  Administered 2012-05-09 – 2012-05-12 (×7): 1 via NASAL
  Filled 2012-05-09: qty 22

## 2012-05-09 NOTE — Progress Notes (Signed)
2 Days Post-Op Procedure(s) (LRB): ASCENDING AORTIC ROOT REPLACEMENT (N/A) CORONARY ARTERY BYPASS GRAFTING (CABG) (N/A) Subjective: Complains of chest pressure Ambulated a little  Nauseous and dizzy  Objective: Vital signs in last 24 hours: Temp:  [98.3 F (36.8 C)-98.9 F (37.2 C)] 98.9 F (37.2 C) (07/14 0731) Pulse Rate:  [72-89] 76  (07/14 1000) Cardiac Rhythm:  [-] Normal sinus rhythm (07/14 0700) Resp:  [0-26] 16  (07/14 1000) BP: (83-112)/(43-56) 102/55 mmHg (07/14 1000) SpO2:  [91 %-96 %] 92 % (07/14 1000) Weight:  [143.3 kg (315 lb 14.7 oz)] 143.3 kg (315 lb 14.7 oz) (07/14 0600)  Hemodynamic parameters for last 24 hours:    Intake/Output from previous day: 07/13 0701 - 07/14 0700 In: 2075.5 [P.O.:720; I.V.:1255.5; IV Piggyback:100] Out: 1195 [Urine:1055; Chest Tube:140] Intake/Output this shift: Total I/O In: 230.1 [P.O.:120; I.V.:110.1] Out: 95 [Urine:95]  General appearance: alert and cooperative Heart: regular rate and rhythm, S1, S2 normal, no murmur, click, rub or gallop Lungs: clear to auscultation bilaterally Extremities: edema mild Wound: incision ok  Lab Results:  Basename 05/09/12 0355 05/08/12 1726 05/08/12 1720  WBC 18.1* -- 18.4*  HGB 10.1* 9.5* --  HCT 29.5* 28.0* --  PLT 130* -- 122*   BMET:  Basename 05/09/12 0355 05/08/12 1726 05/08/12 0400  NA 138 139 --  K 4.2 4.1 --  CL 105 106 --  CO2 28 -- 25  GLUCOSE 155* 137* --  BUN 20 16 --  CREATININE 0.88 1.00 --  CALCIUM 8.1* -- 7.7*    PT/INR:  Basename 05/07/12 1900  LABPROT 18.8*  INR 1.54*   ABG    Component Value Date/Time   PHART 7.338* 05/07/2012 2150   HCO3 23.1 05/07/2012 2150   TCO2 24 05/08/2012 1726   ACIDBASEDEF 3.0* 05/07/2012 2150   O2SAT 96.0 05/07/2012 2150   CBG (last 3)   Basename 05/09/12 0623 05/09/12 0526 05/09/12 0424  GLUCAP 139* 124* 142*    Assessment/Plan: S/P Procedure(s) (LRB): ASCENDING AORTIC ROOT REPLACEMENT (N/A) CORONARY ARTERY BYPASS  GRAFTING (CABG) (N/A) BP still marginal on dopamine.  Wean as tolerated. Mobilize Diuresis when off vasopressor. Diabetes control:  Put back on insulin drip overnight. Glucose probably up due to dopamine he is still on and clear liquid diet. Will increase Lantus.   LOS: 2 days    Evelene Croon K 05/09/2012

## 2012-05-09 NOTE — Progress Notes (Signed)
Pt's CBG at 51mn=194, SSI given then IV insulin and glucostabilizer orders initiated; P.O.C. Discussed with Pt, stated understanding.

## 2012-05-10 ENCOUNTER — Inpatient Hospital Stay (HOSPITAL_COMMUNITY): Payer: BC Managed Care – PPO

## 2012-05-10 ENCOUNTER — Encounter (HOSPITAL_COMMUNITY): Payer: Self-pay | Admitting: Cardiothoracic Surgery

## 2012-05-10 DIAGNOSIS — Z0271 Encounter for disability determination: Secondary | ICD-10-CM

## 2012-05-10 LAB — COMPREHENSIVE METABOLIC PANEL
ALT: 18 U/L (ref 0–53)
AST: 27 U/L (ref 0–37)
Albumin: 2.6 g/dL — ABNORMAL LOW (ref 3.5–5.2)
Alkaline Phosphatase: 36 U/L — ABNORMAL LOW (ref 39–117)
BUN: 26 mg/dL — ABNORMAL HIGH (ref 6–23)
CO2: 29 mEq/L (ref 19–32)
Calcium: 8.3 mg/dL — ABNORMAL LOW (ref 8.4–10.5)
Chloride: 101 mEq/L (ref 96–112)
Creatinine, Ser: 0.97 mg/dL (ref 0.50–1.35)
GFR calc Af Amer: >90 mL/min (ref >90)
GFR calc non Af Amer: >90 mL/min (ref >90)
Glucose, Bld: 100 mg/dL — ABNORMAL HIGH (ref 70–99)
Potassium: 3.8 mEq/L (ref 3.5–5.1)
Sodium: 136 mEq/L (ref 135–145)
Total Bilirubin: 0.2 mg/dL — ABNORMAL LOW (ref 0.3–1.2)
Total Protein: 5.2 g/dL — ABNORMAL LOW (ref 6.0–8.3)

## 2012-05-10 LAB — CBC
HCT: 26.5 % — ABNORMAL LOW (ref 39.0–52.0)
Hemoglobin: 9 g/dL — ABNORMAL LOW (ref 13.0–17.0)
MCH: 28.8 pg (ref 26.0–34.0)
MCHC: 34 g/dL (ref 30.0–36.0)
MCV: 84.9 fL (ref 78.0–100.0)
Platelets: 123 10*3/uL — ABNORMAL LOW (ref 150–400)
RBC: 3.12 MIL/uL — ABNORMAL LOW (ref 4.22–5.81)
RDW: 14.7 % (ref 11.5–15.5)
WBC: 13.6 10*3/uL — ABNORMAL HIGH (ref 4.0–10.5)

## 2012-05-10 LAB — GLUCOSE, CAPILLARY
Glucose-Capillary: 102 mg/dL — ABNORMAL HIGH (ref 70–99)
Glucose-Capillary: 115 mg/dL — ABNORMAL HIGH (ref 70–99)
Glucose-Capillary: 117 mg/dL — ABNORMAL HIGH (ref 70–99)
Glucose-Capillary: 131 mg/dL — ABNORMAL HIGH (ref 70–99)
Glucose-Capillary: 82 mg/dL (ref 70–99)
Glucose-Capillary: 86 mg/dL (ref 70–99)
Glucose-Capillary: 95 mg/dL (ref 70–99)
Glucose-Capillary: 98 mg/dL (ref 70–99)

## 2012-05-10 MED ORDER — AMIODARONE HCL 200 MG PO TABS
400.0000 mg | ORAL_TABLET | Freq: Two times a day (BID) | ORAL | Status: DC
  Administered 2012-05-10 – 2012-05-11 (×4): 400 mg via ORAL
  Filled 2012-05-10 (×7): qty 2

## 2012-05-10 MED ORDER — AMIODARONE HCL 200 MG PO TABS: 400.0000 mg | ORAL_TABLET | Freq: Every day | ORAL | Status: DC

## 2012-05-10 MED ORDER — ENOXAPARIN SODIUM 40 MG/0.4ML ~~LOC~~ SOLN
40.0000 mg | SUBCUTANEOUS | Status: DC
  Administered 2012-05-10 – 2012-05-15 (×6): 40 mg via SUBCUTANEOUS
  Filled 2012-05-10 (×6): qty 0.4

## 2012-05-10 MED ORDER — INSULIN ASPART 100 UNIT/ML ~~LOC~~ SOLN: 0.0000 [IU] | SUBCUTANEOUS | Status: DC

## 2012-05-10 MED ORDER — INSULIN ASPART 100 UNIT/ML ~~LOC~~ SOLN
0.0000 [IU] | SUBCUTANEOUS | Status: DC
  Administered 2012-05-10: 2 [IU] via SUBCUTANEOUS

## 2012-05-10 MED ORDER — POTASSIUM CHLORIDE CRYS ER 20 MEQ PO TBCR
20.0000 meq | EXTENDED_RELEASE_TABLET | Freq: Once | ORAL | Status: AC
  Administered 2012-05-10: 20 meq via ORAL
  Filled 2012-05-10: qty 1

## 2012-05-10 NOTE — Progress Notes (Signed)
Pt continues with foley cath d/t poor progression with activity and cont' dopamine for low BP; will address foley cont' in A.M. with TCTS.

## 2012-05-10 NOTE — Progress Notes (Signed)
Utilization Review Completed.Leray Garverick T7/15/2013   

## 2012-05-10 NOTE — Progress Notes (Signed)
Pt met criteria for transition off glucostabilizer and proceed to Q2hr x 3 then Q4hr with 3 CBG's 120mg /dl AND insulin drip < 1 unit/hr.

## 2012-05-10 NOTE — Progress Notes (Signed)
TCTS BRIEF SICU PROGRESS NOTE  3 Days Post-Op  S/P Procedure(s) (LRB): ASCENDING AORTIC ROOT REPLACEMENT (N/A) CORONARY ARTERY BYPASS GRAFTING (CABG) (N/A)   Stable day  Plan: Continue current plan  Alexzandra Bilton H 05/10/2012 8:01 PM

## 2012-05-10 NOTE — Progress Notes (Signed)
Patient ID: Jonathan Miles, male   DOB: 1960-12-17, 51 y.o.   MRN: [id number] TCTS DAILY PROGRESS NOTE                   [address]            [place] [address]          [phone]      3 Days Post-Op Procedure(s) (LRB): ASCENDING AORTIC ROOT REPLACEMENT (N/A) CORONARY ARTERY BYPASS GRAFTING (CABG) (N/A)  Total Length of Stay:  LOS: 3 days   Subjective: Feels better, nausea resolved  Objective: Vital signs in last 24 hours: Temp:  [97.7 F (36.5 C)-99.1 F (37.3 C)] 98.9 F (37.2 C) (07/15 0750) Pulse Rate:  [72-84] 81  (07/15 0700) Cardiac Rhythm:  [-] Normal sinus rhythm (07/14 2000) Resp:  [10-40] 15  (07/15 0700) BP: (90-119)/(42-61) 109/53 mmHg (07/15 0700) SpO2:  [92 %-100 %] 94 % (07/15 0700) Weight:  [318 lb 2 oz (144.3 kg)] 318 lb 2 oz (144.3 kg) (07/15 0600)  Filed Weights   05/08/12 0600 05/09/12 0600 05/10/12 0600  Weight: 313 lb 7.9 oz (142.2 kg) 315 lb 14.7 oz (143.3 kg) 318 lb 2 oz (144.3 kg)    Weight change: 2 lb 3.3 oz (1 kg)   Hemodynamic parameters for last 24 hours:    Intake/Output from previous day: 07/14 0701 - 07/15 0700 In: 1687.6 [P.O.:600; I.V.:1087.6] Out: 825 [Urine:825]  Intake/Output this shift:    Current Meds: Scheduled Meds:   . acetaminophen  1,000 mg Oral Q6H   Or  . acetaminophen (TYLENOL) oral liquid 160 mg/5 mL  975 mg Per Tube Q6H  . aspirin EC  325 mg Oral Daily   Or  . aspirin  324 mg Per Tube Daily  . atorvastatin  20 mg Oral q1800  . bisacodyl  10 mg Oral Daily   Or  . bisacodyl  10 mg Rectal Daily  . cefUROXime (ZINACEF)  IV  1.5 g Intravenous Q12H  . Chlorhexidine Gluconate Cloth  6 each Topical Daily  . docusate sodium  200 mg Oral Daily  . enoxaparin (LOVENOX) injection  40 mg Subcutaneous Q24H  . insulin aspart  0-15 Units Subcutaneous Q4H  . insulin glargine  30 Units Subcutaneous BID  . metoCLOPramide  10 mg Oral TID AC & HS  . mupirocin ointment  1 application Nasal BID  .  pantoprazole  40 mg Oral Q1200  . sodium chloride  3 mL Intravenous Q12H  . DISCONTD: insulin aspart  0-24 Units Subcutaneous Q4H  . DISCONTD: insulin glargine  30 Units Subcutaneous Daily   Continuous Infusions:   . sodium chloride 20 mL/hr at 05/07/12 1928  . sodium chloride 20 mL/hr at 05/07/12 1900  . sodium chloride 250 mL (05/08/12 0502)  . amiodarone (NEXTERONE PREMIX) 360 mg/200 mL dextrose 0.5 mg/min (05/09/12 2320)  . DOPamine Stopped (05/10/12 0200)  . phenylephrine (NEO-SYNEPHRINE) Adult infusion Stopped (05/08/12 1500)  . DISCONTD: insulin (NOVOLIN-R) infusion Stopped (05/10/12 0143)   PRN Meds:.dextrose, metoprolol, morphine injection, ondansetron (ZOFRAN) IV, oxyCODONE, sodium chloride, DISCONTD: midazolam  General appearance: alert, cooperative and no distress Neurologic: intact Heart: regular rate and rhythm, S1, S2 normal, no murmur, click, rub or gallop and normal apical impulse Lungs: clear to auscultation bilaterally and normal percussion bilaterally No m of AI  Lab Results: CBC: Basename 05/10/12 0330 05/09/12 0355  WBC 13.6* 18.1*  HGB 9.0* 10.1*  HCT 26.5* 29.5*  PLT 123*  130*   BMET:  Basename 05/10/12 0330 05/09/12 0355  NA 136 138  K 3.8 4.2  CL 101 105  CO2 29 28  GLUCOSE 100* 155*  BUN 26* 20  CREATININE 0.97 0.88  CALCIUM 8.3* 8.1*    PT/INR:  Basename 05/07/12 1900  LABPROT 18.8*  INR 1.54*   Radiology: Dg Chest Portable 1 View In Am  05/09/2012  *RADIOLOGY REPORT*  Clinical Data: Coronary artery disease.  Postop from coronary bypass grafting.  PORTABLE CHEST - 1 VIEW  Comparison: 05/08/2012  Findings: The Swan-Ganz catheter and mediastinal drains been removed.  No pneumothorax identified.  Cardiomegaly stable.  Low lung volumes are again seen.  Mild left basilar atelectasis is stable.  No evidence of pulmonary consolidation or acute congestive heart failure.  IMPRESSION: No significant change in cardiomegaly and mild left basilar  atelectasis.  No acute findings.  Original Report Authenticated By: Danae Orleans, M.D.     Assessment/Plan: S/P Procedure(s) (LRB): ASCENDING AORTIC ROOT REPLACEMENT (N/A) CORONARY ARTERY BYPASS GRAFTING (CABG) (N/A) Mobilize Diuresis d/c tubes/lines D/c foley    Delight Ovens MD  Beeper [phone] Office [phone] 05/10/2012 7:59 AM

## 2012-05-10 NOTE — Consult Note (Signed)
  Pharmacy note: Amiodarone Drug Interactions  There are > 700 drugs that are known to interact with Amiodarone.    The clinically significant interactions with Amiodarone include Zocor, Lasix (increased risk for hypokalemia), Coumadin, Quinolones (Levofloxacin, Moxifloxacin, but NOT as much Ciprofloxacin), Azithromycin, Digoxin, Loratidine.  Other drugs are not as common, or less likely to have clinically significant interactions.  There are numerous lists available with variable risks when given concurrently with Amiodarone.  Other useful references address QTc prolongation and the added risks for Torsades.  (www.azcert.org).    Grapefruit juice may increase the blood levels and side effects of various drug metabolized by the cytochrome-P450 system - this includes dihydropyridine calcium channel blockers (verapamil, plendil, adalat, procardia, norvasc and others), some HMG Co-A reductase inhibitors (Zocor, Lipitor, Mevacor but Not Pravachol or Lescol).  Crestor has a lower risk for adverse interactions with Amiodarone.  Recommendations: 1.  Avoid Zocor (simvastatin).        Your patient is currently taking atorvastatin [Lipitor].  Crestor,  has the lowest risk for adverse interactions with amiodarone in this group. 2.  Avoid the quinolones (concomitant blockade of cardiac K+ channels).  Cipro has lower risk for QTc prolongation than other quinolones.  PCN's and cephalosporins may be alternative        choice if antibiotic if your patient may need an antibiotic is the future.. 3.  Azithromycin and Erythromycin both may enhance QTc prolongation and should be avoided or used cautiously. 4.  Digoxin levels may be increased by interaction with Amiodarone. 5.  Coumadin and Amiodarone's interaction is a commonly recognized one.  Monitor INR's closely. 6.  Loratidine has dose-related effects on QTc prolongation.  Amiodarone can increase loratidine levels and increase the risk of Torsades.  Would suggest  other antihistamines. 7.  Lasix may reduce K+ and Mg++ concentrations and increase the risk of ventricular arrhythmias.  (Also includes any K+-wasting diuretics, Ampho B, Kayexalate, and excessive stimulant laxatives). 8.  Avoid Grapefruit juice.  Currently, patient is only taking Lipitor which is discussed above.  No adjustments in drug therapy are needed at this time unless your patient develops muscle pain.  If so, then Lipitor may be substituted with Crestor.  If you have any specific questions or issues, please ask.   Thank you.   Zanovia Rotz, Elisha Headland, Pharm.D.  05/10/2012 9:54 AM

## 2012-05-11 ENCOUNTER — Inpatient Hospital Stay (HOSPITAL_COMMUNITY): Payer: BC Managed Care – PPO

## 2012-05-11 LAB — CBC
HCT: 25.7 % — ABNORMAL LOW (ref 39.0–52.0)
Hemoglobin: 8.8 g/dL — ABNORMAL LOW (ref 13.0–17.0)
MCH: 29.1 pg (ref 26.0–34.0)
MCHC: 34.2 g/dL (ref 30.0–36.0)
MCV: 85.1 fL (ref 78.0–100.0)
Platelets: 163 10*3/uL (ref 150–400)
RBC: 3.02 MIL/uL — ABNORMAL LOW (ref 4.22–5.81)
RDW: 14.6 % (ref 11.5–15.5)
WBC: 10.3 10*3/uL (ref 4.0–10.5)

## 2012-05-11 LAB — GLUCOSE, CAPILLARY
Glucose-Capillary: 104 mg/dL — ABNORMAL HIGH (ref 70–99)
Glucose-Capillary: 105 mg/dL — ABNORMAL HIGH (ref 70–99)
Glucose-Capillary: 68 mg/dL — ABNORMAL LOW (ref 70–99)
Glucose-Capillary: 86 mg/dL (ref 70–99)
Glucose-Capillary: 93 mg/dL (ref 70–99)
Glucose-Capillary: 99 mg/dL (ref 70–99)

## 2012-05-11 LAB — BASIC METABOLIC PANEL
BUN: 25 mg/dL — ABNORMAL HIGH (ref 6–23)
CO2: 27 mEq/L (ref 19–32)
Calcium: 8.2 mg/dL — ABNORMAL LOW (ref 8.4–10.5)
Chloride: 103 mEq/L (ref 96–112)
Creatinine, Ser: 0.86 mg/dL (ref 0.50–1.35)
GFR calc Af Amer: >90 mL/min (ref >90)
GFR calc non Af Amer: >90 mL/min (ref >90)
Glucose, Bld: 82 mg/dL (ref 70–99)
Potassium: 3.7 mEq/L (ref 3.5–5.1)
Sodium: 138 mEq/L (ref 135–145)

## 2012-05-11 MED ORDER — BISACODYL 10 MG RE SUPP: 10.0000 mg | Freq: Every day | RECTAL | Status: DC | PRN

## 2012-05-11 MED ORDER — BISACODYL 5 MG PO TBEC
10.0000 mg | DELAYED_RELEASE_TABLET | Freq: Every day | ORAL | Status: DC | PRN
  Filled 2012-05-11: qty 2

## 2012-05-11 MED ORDER — TRAMADOL HCL 50 MG PO TABS
50.0000 mg | ORAL_TABLET | ORAL | Status: DC | PRN
  Administered 2012-05-11: 100 mg via ORAL
  Filled 2012-05-11 (×2): qty 2

## 2012-05-11 MED ORDER — POTASSIUM CHLORIDE CRYS ER 20 MEQ PO TBCR
20.0000 meq | EXTENDED_RELEASE_TABLET | Freq: Every day | ORAL | Status: DC
  Administered 2012-05-11 – 2012-05-15 (×5): 20 meq via ORAL
  Filled 2012-05-11 (×5): qty 1

## 2012-05-11 MED ORDER — METOPROLOL TARTRATE 12.5 MG HALF TABLET
12.5000 mg | ORAL_TABLET | Freq: Two times a day (BID) | ORAL | Status: DC
  Administered 2012-05-11 – 2012-05-15 (×9): 12.5 mg via ORAL
  Filled 2012-05-11 (×10): qty 1

## 2012-05-11 MED ORDER — ONDANSETRON HCL 4 MG/2ML IJ SOLN
4.0000 mg | Freq: Four times a day (QID) | INTRAMUSCULAR | Status: DC | PRN
  Administered 2012-05-11: 4 mg via INTRAVENOUS
  Filled 2012-05-11: qty 2

## 2012-05-11 MED ORDER — SODIUM CHLORIDE 0.9 % IJ SOLN: 3.0000 mL | INTRAMUSCULAR | Status: DC | PRN

## 2012-05-11 MED ORDER — OXYCODONE HCL 5 MG PO TABS
5.0000 mg | ORAL_TABLET | ORAL | Status: DC | PRN
  Administered 2012-05-11 – 2012-05-13 (×8): 10 mg via ORAL
  Administered 2012-05-14 (×2): 5 mg via ORAL
  Filled 2012-05-11 (×9): qty 2
  Filled 2012-05-11: qty 1

## 2012-05-11 MED ORDER — FUROSEMIDE 40 MG PO TABS
40.0000 mg | ORAL_TABLET | Freq: Every day | ORAL | Status: DC
  Administered 2012-05-11 – 2012-05-15 (×5): 40 mg via ORAL
  Filled 2012-05-11 (×5): qty 1

## 2012-05-11 MED ORDER — PANTOPRAZOLE SODIUM 40 MG PO TBEC
40.0000 mg | DELAYED_RELEASE_TABLET | Freq: Every day | ORAL | Status: DC
  Administered 2012-05-12 – 2012-05-14 (×3): 40 mg via ORAL
  Filled 2012-05-11 (×5): qty 1

## 2012-05-11 MED ORDER — ACETAMINOPHEN 325 MG PO TABS
650.0000 mg | ORAL_TABLET | Freq: Four times a day (QID) | ORAL | Status: DC | PRN
  Administered 2012-05-11 – 2012-05-15 (×4): 650 mg via ORAL
  Filled 2012-05-11 (×4): qty 2

## 2012-05-11 MED ORDER — DOCUSATE SODIUM 100 MG PO CAPS
200.0000 mg | ORAL_CAPSULE | Freq: Every day | ORAL | Status: DC
  Administered 2012-05-11 – 2012-05-15 (×5): 200 mg via ORAL
  Filled 2012-05-11 (×5): qty 2

## 2012-05-11 MED ORDER — ONDANSETRON HCL 4 MG PO TABS
4.0000 mg | ORAL_TABLET | Freq: Four times a day (QID) | ORAL | Status: DC | PRN
  Administered 2012-05-13 – 2012-05-15 (×2): 4 mg via ORAL
  Filled 2012-05-11 (×2): qty 1

## 2012-05-11 MED ORDER — POTASSIUM CHLORIDE 10 MEQ/50ML IV SOLN
INTRAVENOUS | Status: AC
  Administered 2012-05-11: 10 meq via INTRAVENOUS
  Filled 2012-05-11: qty 150

## 2012-05-11 MED ORDER — POTASSIUM CHLORIDE 10 MEQ/50ML IV SOLN
10.0000 meq | INTRAVENOUS | Status: DC | PRN
  Administered 2012-05-11 (×3): 10 meq via INTRAVENOUS

## 2012-05-11 MED ORDER — SODIUM CHLORIDE 0.9 % IJ SOLN
3.0000 mL | Freq: Two times a day (BID) | INTRAMUSCULAR | Status: DC
  Administered 2012-05-11 – 2012-05-14 (×8): 3 mL via INTRAVENOUS

## 2012-05-11 MED ORDER — INSULIN GLARGINE 100 UNIT/ML ~~LOC~~ SOLN
20.0000 [IU] | Freq: Two times a day (BID) | SUBCUTANEOUS | Status: DC
  Administered 2012-05-11 (×2): 20 [IU] via SUBCUTANEOUS

## 2012-05-11 MED ORDER — ASPIRIN EC 325 MG PO TBEC
325.0000 mg | DELAYED_RELEASE_TABLET | Freq: Every day | ORAL | Status: DC
  Administered 2012-05-11 – 2012-05-15 (×5): 325 mg via ORAL
  Filled 2012-05-11 (×5): qty 1

## 2012-05-11 MED ORDER — INSULIN ASPART 100 UNIT/ML ~~LOC~~ SOLN
0.0000 [IU] | Freq: Three times a day (TID) | SUBCUTANEOUS | Status: DC
  Administered 2012-05-15: 2 [IU] via SUBCUTANEOUS

## 2012-05-11 MED ORDER — SODIUM CHLORIDE 0.9 % IV SOLN: 250.0000 mL | INTRAVENOUS | Status: DC | PRN

## 2012-05-11 MED ORDER — MAGNESIUM HYDROXIDE 400 MG/5ML PO SUSP
30.0000 mL | Freq: Every day | ORAL | Status: DC | PRN
  Administered 2012-05-15: 30 mL via ORAL
  Filled 2012-05-11: qty 30

## 2012-05-11 MED ORDER — METFORMIN HCL 500 MG PO TABS
500.0000 mg | ORAL_TABLET | Freq: Two times a day (BID) | ORAL | Status: DC
  Administered 2012-05-11 (×2): 500 mg via ORAL
  Filled 2012-05-11 (×11): qty 1

## 2012-05-11 MED ORDER — MOVING RIGHT ALONG BOOK
Freq: Once | Status: AC
  Administered 2012-05-11: 12:00:00
  Filled 2012-05-11: qty 1

## 2012-05-11 MED ORDER — POTASSIUM CHLORIDE CRYS ER 20 MEQ PO TBCR: 20.0000 meq | EXTENDED_RELEASE_TABLET | Freq: Once | ORAL | Status: DC

## 2012-05-11 MED FILL — Mannitol IV Soln 20%: INTRAVENOUS | Qty: 500 | Status: AC

## 2012-05-11 MED FILL — Heparin Sodium (Porcine) Inj 1000 Unit/ML: INTRAMUSCULAR | Qty: 30 | Status: AC

## 2012-05-11 MED FILL — Heparin Sodium (Porcine) Inj 1000 Unit/ML: INTRAMUSCULAR | Qty: 10 | Status: AC

## 2012-05-11 MED FILL — Lidocaine HCl IV Inj 20 MG/ML: INTRAVENOUS | Qty: 10 | Status: AC

## 2012-05-11 MED FILL — Electrolyte-R (PH 7.4) Solution: INTRAVENOUS | Qty: 4000 | Status: AC

## 2012-05-11 MED FILL — Sodium Chloride Irrigation Soln 0.9%: Qty: 2000 | Status: AC

## 2012-05-11 MED FILL — Sodium Chloride IV Soln 0.9%: INTRAVENOUS | Qty: 1000 | Status: AC

## 2012-05-11 MED FILL — Sodium Bicarbonate IV Soln 8.4%: INTRAVENOUS | Qty: 50 | Status: AC

## 2012-05-11 NOTE — Progress Notes (Addendum)
[address]            [place] [address]          [phone]     4 Days Post-Op Procedure(s) (LRB): ASCENDING AORTIC ROOT REPLACEMENT (N/A) CORONARY ARTERY BYPASS GRAFTING (CABG) (N/A)  Subjective: OOB in chair.  Nausea resolved, but still no appetite.  Feels well overall.   Objective: Vital signs in last 24 hours: Patient Vitals for the past 24 hrs:  BP Temp Temp src Pulse Resp SpO2 Weight  05/11/12 0747 - 98.6 F (37 C) Oral - - - -  05/11/12 0700 100/55 mmHg - - 86  51  95 % -  05/11/12 0600 117/58 mmHg - - 95  33  91 % 319 lb 0.1 oz (144.7 kg)  05/11/12 0500 104/53 mmHg - - 81  13  96 % -  05/11/12 0400 109/47 mmHg 98.4 F (36.9 C) Oral - 16  95 % -  05/11/12 0300 107/60 mmHg - - - 19  97 % -  05/11/12 0200 107/51 mmHg - - 80  15  95 % -  05/11/12 0100 103/51 mmHg - - 81  18  95 % -  05/11/12 0000 101/62 mmHg 98.8 F (37.1 C) Oral 80  34  97 % -  05/10/12 2300 110/52 mmHg - - 82  22  94 % -  05/10/12 2200 102/60 mmHg - - 81  32  95 % -  05/10/12 2100 106/44 mmHg - - 80  17  96 % -  05/10/12 2000 110/44 mmHg - - 79  10  95 % -  05/10/12 1951 - 100.1 F (37.8 C) Oral - - - -  05/10/12 1943 - 98.7 F (37.1 C) Oral - - - -  05/10/12 1900 107/47 mmHg - - 81  21  93 % -  05/10/12 1800 107/47 mmHg - - 78  45  95 % -  05/10/12 1700 96/45 mmHg - - 77  17  97 % -  05/10/12 1616 - 98.7 F (37.1 C) Oral - - - -  05/10/12 1600 93/48 mmHg - - 80  21  96 % -  05/10/12 1500 95/54 mmHg - - 77  15  94 % -  05/10/12 1400 110/47 mmHg - - 80  15  95 % -  05/10/12 1300 102/48 mmHg - - 77  15  96 % -  05/10/12 1200 103/53 mmHg - - 79  21  93 % -  05/10/12 1148 - 98.5 F (36.9 C) Oral - - - -  05/10/12 1100 84/47 mmHg - - 80  14  96 % -  05/10/12 1000 110/60 mmHg - - 90  37  97 % -   Current Weight  05/11/12 319 lb 0.1 oz (144.7 kg)   Pre-op wt= 138 kg  Intake/Output from previous day: 07/15 0701 - 07/16 0700 In: 721.8 [P.O.:125;  I.V.:546.8; IV Piggyback:50] Out: 1400 [Urine:1400]  CBGs 102-115-85-68-82  PHYSICAL EXAM:  Heart: RRR Lungs: clear Wound: clean and dry Extremities: mild LE edema    Lab Results: CBC: Basename 05/11/12 0416 05/10/12 0330  WBC 10.3 13.6*  HGB 8.8* 9.0*  HCT 25.7* 26.5*  PLT 163 123*   BMET:  Basename 05/11/12 0416 05/10/12 0330  NA 138 136  K 3.7 3.8  CL 103 101  CO2 27 29  GLUCOSE  82 100*  BUN 25* 26*  CREATININE 0.86 0.97  CALCIUM 8.2* 8.3*    PT/INR: No results found for this basename: LABPROT,INR in the last 72 hours  CXR:   Assessment/Plan: S/P Procedure(s) (LRB): ASCENDING AORTIC ROOT REPLACEMENT (N/A) CORONARY ARTERY BYPASS GRAFTING (CABG) (N/A)  CV- SR, BPs controlled. Continue current meds.  Vol overload- diurese.  Leukocytosis- WBC continue to trend down.  DM- sugars well controlled.  Po intake improving.  Will resume po Metformin, decrease Lantus.  A1C= 6.1  ?Tx 2000 today.  Continue ambulation, pulm toilet.   LOS: 4 days    COLLINS,GINA H 05/11/2012   I have seen and examined Jonathan Miles and agree with the above assessment  and plan.  Delight Ovens MD Beeper [phone] Office [phone] 05/11/2012 8:48 AM

## 2012-05-12 ENCOUNTER — Inpatient Hospital Stay (HOSPITAL_COMMUNITY): Payer: BC Managed Care – PPO

## 2012-05-12 LAB — CBC
HCT: 26 % — ABNORMAL LOW (ref 39.0–52.0)
Hemoglobin: 8.6 g/dL — ABNORMAL LOW (ref 13.0–17.0)
MCH: 28.6 pg (ref 26.0–34.0)
MCHC: 33.1 g/dL (ref 30.0–36.0)
MCV: 86.4 fL (ref 78.0–100.0)
Platelets: 244 10*3/uL (ref 150–400)
RBC: 3.01 MIL/uL — ABNORMAL LOW (ref 4.22–5.81)
RDW: 14.7 % (ref 11.5–15.5)
WBC: 10.9 10*3/uL — ABNORMAL HIGH (ref 4.0–10.5)

## 2012-05-12 LAB — BASIC METABOLIC PANEL
BUN: 20 mg/dL (ref 6–23)
CO2: 27 mEq/L (ref 19–32)
Calcium: 8.3 mg/dL — ABNORMAL LOW (ref 8.4–10.5)
Chloride: 103 mEq/L (ref 96–112)
Creatinine, Ser: 0.91 mg/dL (ref 0.50–1.35)
GFR calc Af Amer: >90 mL/min (ref >90)
GFR calc non Af Amer: >90 mL/min (ref >90)
Glucose, Bld: 90 mg/dL (ref 70–99)
Potassium: 3.9 mEq/L (ref 3.5–5.1)
Sodium: 140 mEq/L (ref 135–145)

## 2012-05-12 LAB — GLUCOSE, CAPILLARY
Glucose-Capillary: 85 mg/dL (ref 70–99)
Glucose-Capillary: 115 mg/dL — ABNORMAL HIGH (ref 70–99)
Glucose-Capillary: 90 mg/dL (ref 70–99)
Glucose-Capillary: 95 mg/dL (ref 70–99)

## 2012-05-12 MED ORDER — OXYCODONE HCL 5 MG PO TABS: 5.0000 mg | ORAL_TABLET | ORAL | Status: AC | PRN

## 2012-05-12 MED ORDER — POTASSIUM CHLORIDE CRYS ER 20 MEQ PO TBCR: 20.0000 meq | EXTENDED_RELEASE_TABLET | Freq: Every day | ORAL | Status: DC

## 2012-05-12 MED ORDER — METOPROLOL TARTRATE 25 MG PO TABS: 12.5000 mg | ORAL_TABLET | Freq: Two times a day (BID) | ORAL | Status: DC

## 2012-05-12 MED ORDER — ASPIRIN 325 MG PO TBEC: 325.0000 mg | DELAYED_RELEASE_TABLET | Freq: Every day | ORAL | Status: AC

## 2012-05-12 MED ORDER — LACTULOSE 10 GM/15ML PO SOLN
20.0000 g | Freq: Once | ORAL | Status: AC
  Administered 2012-05-12: 20 g via ORAL
  Filled 2012-05-12: qty 30

## 2012-05-12 MED ORDER — ATORVASTATIN CALCIUM 20 MG PO TABS: 20.0000 mg | ORAL_TABLET | Freq: Every day | ORAL | Status: DC

## 2012-05-12 MED ORDER — AMIODARONE HCL 200 MG PO TABS: 200.0000 mg | ORAL_TABLET | Freq: Two times a day (BID) | ORAL | Status: DC

## 2012-05-12 MED ORDER — AMIODARONE HCL 200 MG PO TABS
200.0000 mg | ORAL_TABLET | Freq: Two times a day (BID) | ORAL | Status: DC
  Administered 2012-05-12 – 2012-05-15 (×7): 200 mg via ORAL
  Filled 2012-05-12 (×8): qty 1

## 2012-05-12 MED ORDER — FUROSEMIDE 40 MG PO TABS: 40.0000 mg | ORAL_TABLET | Freq: Every day | ORAL | Status: DC

## 2012-05-12 NOTE — Discharge Summary (Signed)
Physician Discharge Summary  Patient ID: Jonathan Miles MRN: [id number] DOB/AGE: 06/10/1961 51 y.o.  Admit date: 05/07/2012 Discharge date: 05/13/2012  Admission Diagnoses: 1.Aortic root dilatation  a. echo 4/12: EF 55-60%, mod to marked dilated ascending aortic root; b. MRA 4/12: Aortic root 5.5 cm, dilation of left and right coronary cusps, trileaflet aortic valve  2.Hypertension  3.Hyperlipidemia  4.Diabetes mellitus:type 2   5.Chest pain: Myoview 4/12: EF 62%, no ischemia or scar  6.PONV (postoperative nausea and vomiting)   7.GERD (gastroesophageal reflux disease) modifies with diet  8.Arthritis   9.Asthma  ast attack in early 20's  10.OSA (obstructive sleep apnea) . Wears at times   Discharge Diagnoses:  1.Aortic root dilatation  a. echo 4/12: EF 55-60%, mod to marked dilated ascending aortic root; b. MRA 4/12: Aortic root 5.5 cm, dilation of left and right coronary cusps, trileaflet aortic valve  2.Hypertension  3.Hyperlipidemia  4.Diabetes mellitus:type 2   5.Chest pain: Myoview 4/12: EF 62%, no ischemia or scar  6.PONV (postoperative nausea and vomiting)   7.GERD (gastroesophageal reflux disease) modifies with diet  8.Arthritis   9.Asthma  ast attack in early 20's  10.OSA (obstructive sleep apnea) . Wears at times 11.ABL anemia  Procedure (s):  MEDIAN STERNOTOMY FOR AORTIC VALVE SPARING ASCENDING AORTIC ROOT REPLACEMENT (30mm Hemashield graft),CORONARY ARTERY BYPASS GRAFTING X1  (SVG TO RCA) WITH ENDOSCOPIC SAPHENOUS VEIN HARVEST RIGHT THIGH by Dr. Tyrone Sage on 05/07/2012.  History of Presenting Illness: This is a 51 year old African American male who was originally seen on 05/29/2011 by Dr. Tyrone Sage. He was initially referred because of intermittent numbness in his left arm. He was then referred for stress echo by his primary physician. Echocardiogram showed a dilated aortic root so ultimately he had a Cardiolite nuclear test that showed no evidence of ischemia. MRI  suggested a dilatation of his aortic root and he had no evidence of aortic insufficiency. He does not know his family history as he is adopted. He notes since last seen he does occasionally have intermittent tingling in his left arm. He denied any chest pain, shortness of breath, or other symptoms of congestive heart failure. Potential risks, benefits, and complications of the surgery were discussed with the patient and he agreed to proceed. Pre operative carotid duplex US showed no evidence of significant carotid artery stenosis. ABI's were 1.15 on the right and 1.22 on the left.He was admitted to [facility] on 05/07/2012 in order to undergo the AV sparing ascending AR replacement and CABGx1.  Brief Hospital Course:  He was extubated without difficulty later the evening of surgery. His Theone Murdoch, a line, chest tubes, and foley were all removed early in his post operative course. He was weaned off vasopressors. He had been placed on Amiodarone immediately post op. He was then started on low dose Lopressor. He was volume overloaded and diuresed accordingly.He did have ABL anemia. His H and H went as low as 8.6 and 26. He did not require a transfusion. He was not placed on Nu Iron as he had some nausea. His Amiodarone was decreased and Ultram was stopped, as this had made him "sick to his stomach" with a previous surgery. He remained in sinus rhythm.He was restarted on his Metformin with good control of his glucose. His HGA1C pre op was 6.1. He was felt surgically stable for transfer from the ICU to PCTU for further convalescence on 05/11/2012. He is tolerating a diet, but does not have much appetite. He has had a  bowel movement. Epicardial pacing wires and chest tube sutures will be removed prior to his discharge. He is ambulating well on room air. Provided he remains afebrile, hemodynamically stable, and pending morning round evaluation, he will be surgically stable for discharge on 05/13/2012.  Latest Vital  Signs: Blood pressure 109/68, pulse 82, temperature 98.9 F (37.2 C), temperature source Oral, resp. rate 20, height 6\' 1"  (1.854 m), weight 319 lb 0.1 oz (144.7 kg), SpO2 90.00%.  Physical Exam: Cardiovascular: RRR, no murmurs, gallops, or rubs.  Pulmonary: Diminished at bases; no rales, wheezes, or rhonchi.  Abdomen: Soft, non tender, bowel sounds present.  Extremities: Mild bilateral lower extremity edema.  Wounds: Clean and dry. No erythema or signs of infection.   Discharge Condition:Stable  Recent laboratory studies:  Lab Results  Component Value Date   WBC 10.9* 05/12/2012   HGB 8.6* 05/12/2012   HCT 26.0* 05/12/2012   MCV 86.4 05/12/2012   PLT 244 05/12/2012   Lab Results  Component Value Date   NA 140 05/12/2012   K 3.9 05/12/2012   CL 103 05/12/2012   CO2 27 05/12/2012   CREATININE 0.91 05/12/2012   GLUCOSE 90 05/12/2012     Diagnostic Studies: Dg Chest 2 View  05/12/2012  *RADIOLOGY REPORT*  Clinical Data: Chest pain, post chest tube removal  CHEST - 2 VIEW  Comparison: 05/11/2012  Findings: Patchy retrocardiac/left lower lobe opacity, atelectasis versus pneumonia.  Small left pleural effusion.  No pneumothorax.  Cardiomegaly.  Prior pneumomediastinum is not well visualized on the current study and may have resolved.  Postsurgical changes related to prior CABG.  IMPRESSION: Left lower lobe opacity, atelectasis versus pneumonia.  Associated small left pleural effusion.  No pneumothorax is seen.  Original Report Authenticated By: Charline Bills, M.D.   Discharge Orders    Future Appointments: Provider: Department: Dept Phone: Center:   06/03/2012 4:00 PM Delight Ovens, MD [facility] [place] [phone] [facility]   06/21/2012 8:30 AM Laurey Morale, MD [facility] [phone] [facility]      Discharge Medications: Medication List  As of 05/12/2012 10:21 AM   STOP taking these medications         lisinopril 20 MG tablet      lisinopril-hydrochlorothiazide 20-25 MG per  tablet      metoprolol succinate 100 MG 24 hr tablet      simvastatin 40 MG tablet      vitamin E 400 UNIT capsule         TAKE these medications         acetaminophen 500 MG tablet   Commonly known as: TYLENOL   Take 500 mg by mouth every 6 (six) hours as needed. For pain.      amiodarone 200 MG tablet   Commonly known as: PACERONE   Take 1 tablet (200 mg total) by mouth every 12 (twelve) hours.      aspirin 325 MG EC tablet   Take 1 tablet (325 mg total) by mouth daily.      atorvastatin 20 MG tablet   Commonly known as: LIPITOR   Take 1 tablet (20 mg total) by mouth daily at 6 PM.      felodipine 10 MG 24 hr tablet   Commonly known as: PLENDIL   Take 10 mg by mouth every evening.      furosemide 40 MG tablet   Commonly known as: LASIX   Take 1 tablet (40 mg total) by mouth daily. For one week then stop.  metFORMIN 500 MG tablet   Commonly known as: GLUCOPHAGE   Take 500 mg by mouth 2 (two) times daily with a meal.      metoprolol tartrate 25 MG tablet   Commonly known as: LOPRESSOR   Take 0.5 tablets (12.5 mg total) by mouth 2 (two) times daily.      oxyCODONE 5 MG immediate release tablet   Commonly known as: Oxy IR/ROXICODONE   Take 1-2 tablets (5-10 mg total) by mouth every 4 (four) hours as needed for pain.      potassium chloride SA 20 MEQ tablet   Commonly known as: K-DUR,KLOR-CON   Take 1 tablet (20 mEq total) by mouth daily. For one week then stop.            Follow Up Appointments: Follow-up Information    Follow up with Tonny Bollman, MD. (Call for a follow up appointment for 2 weeks)    Contact information:   [address] [place] [address] [phone]       Follow up with Sheliah Plane B, MD. (PA/LAT CXR to be taken on 06/03/2012 (at [facility] which is in the same building as Dr. Dennie Maizes office) at 3:00 pm;Appointment with Dr. Tyrone Sage is on 06/03/2012 at 4:00 pm)    Contact information:     [address] [place] [address] [phone]          Signed: Doree Fudge MPA-C 05/12/2012, 10:21 AM

## 2012-05-12 NOTE — Progress Notes (Addendum)
CARDIAC REHAB PHASE I   PRE:  Rate/Rhythm: 89 SR  BP:  Supine: 118/65  Sitting:   Standing:    SaO2: 93 RA  MODE:  Ambulation: 450 ft   POST:  Rate/Rhythem: 101 ST  BP:  Supine:   Sitting: to bathroom after walk  Standing:    SaO2: 95 RA [id number] Assisted X 1 and used walker to ambulate. Gait steady with walker Pt able to walk 450 feet without c/o VS stable Pt to bathroom after walk.Encouraged pt to walk two more times today.Pt felt nauseated after taking medications had to drink some gingerale and rest before being able to walk.  Beatrix Fetters

## 2012-05-12 NOTE — Progress Notes (Addendum)
5 Days Post-Op Procedure(s) (LRB): ASCENDING AORTIC ROOT REPLACEMENT (N/A) CORONARY ARTERY BYPASS GRAFTING (CABG) (N/A)  Subjective: Patient passing flatus but no bowel movement yet.Has some nausea. He thinks it was the Ultram as he has not tolerated that before. He denies abdominal pain or emesis.He has occasional cough (clear to yellow sputum).  Objective: Vital signs in last 24 hours: Patient Vitals for the past 24 hrs:  BP Temp Temp src Pulse Resp SpO2 Weight  05/12/12 0413 109/68 mmHg 98.9 F (37.2 C) Oral 82  20  90 % 319 lb 0.1 oz (144.7 kg)  05/11/12 2035 127/63 mmHg 99.1 F (37.3 C) Oral 90  20  92 % -  05/11/12 1640 125/78 mmHg 99.1 F (37.3 C) Oral - 22  94 % -  05/11/12 1549 - 100 F (37.8 C) Oral - - - -  05/11/12 1200 103/58 mmHg - - 75  29  96 % -  05/11/12 1137 - 98.7 F (37.1 C) Oral - - - -  05/11/12 1100 101/52 mmHg - - 74  20  95 % -  05/11/12 1000 123/64 mmHg - - 84  34  98 % -  05/11/12 0900 122/60 mmHg - - 84  34  96 % -   Pre op weight 138 kg Current Weight  05/12/12 319 lb 0.1 oz (144.7 kg)      Intake/Output from previous day: 07/16 0701 - 07/17 0700 In: 460 [P.O.:360; IV Piggyback:100] Out: 375 [Urine:375]   Physical Exam:  Cardiovascular: RRR, no murmurs, gallops, or rubs. Pulmonary: Diminished at bases; no rales, wheezes, or rhonchi. Abdomen: Soft, non tender, bowel sounds present. Extremities: Mild bilateral lower extremity edema. Wounds: Clean and dry.  No erythema or signs of infection.  Lab Results: CBC: Basename 05/12/12 0520 05/11/12 0416  WBC 10.9* 10.3  HGB 8.6* 8.8*  HCT 26.0* 25.7*  PLT 244 163   BMET:  Basename 05/12/12 0520 05/11/12 0416  NA 140 138  K 3.9 3.7  CL 103 103  CO2 27 27  GLUCOSE 90 82  BUN 20 25*  CREATININE 0.91 0.86  CALCIUM 8.3* 8.2*     Assessment/Plan:  1. CV - SR.Continue Lopressor 12.5 bid. Also, on Amiodarone 400 bid. 2.  Pulmonary - Encourage incentive spirometer. CXR this am shows  cardiomegaly, no pneumothorax, small left pleural effusion, and LLL opacity (atelectasis vs. pna). 3. Volume Overload - Continue with diuresis. 4.  Acute blood loss anemia - H and H 8.6 and 26. 5.Dm-CBGs 105/93/85.Pre op HGA1C 6.1.Continue Metformin bid (give tonight and not this am). Will stop scheduled insulin and continue it PRN. 6.GI-some nausea. Will stop Ultram and will decrease Amiodarone. 7.Remove EPW in am. 8.Possible discharge in 1-2 days.  ZIMMERMAN,DONIELLE MPA-C 05/12/2012   I have seen and examined Jonathan Miles and agree with the above assessment  and plan.  Delight Ovens MD Beeper [phone] Office [phone] 05/12/2012 12:07 PM

## 2012-05-12 NOTE — Progress Notes (Signed)
Pt ambulated 400 ft with rolling walker. Pt tolerated well. Back to chair with call bell in reach. Jonathan Miles

## 2012-05-13 LAB — GLUCOSE, CAPILLARY
Glucose-Capillary: 88 mg/dL (ref 70–99)
Glucose-Capillary: 115 mg/dL — ABNORMAL HIGH (ref 70–99)
Glucose-Capillary: 96 mg/dL (ref 70–99)
Glucose-Capillary: 114 mg/dL — ABNORMAL HIGH (ref 70–99)

## 2012-05-13 LAB — URINALYSIS, ROUTINE W REFLEX MICROSCOPIC
Glucose, UA: NEGATIVE mg/dL
Hgb urine dipstick: NEGATIVE
Ketones, ur: 15 mg/dL — AB
Leukocytes, UA: NEGATIVE
Nitrite: NEGATIVE
Protein, ur: NEGATIVE mg/dL
Specific Gravity, Urine: 1.025 (ref 1.005–1.030)
Urobilinogen, UA: 1 mg/dL (ref 0.0–1.0)
pH: 5.5 (ref 5.0–8.0)

## 2012-05-13 MED ORDER — MOXIFLOXACIN HCL IN NACL 400 MG/250ML IV SOLN
400.0000 mg | INTRAVENOUS | Status: DC
  Administered 2012-05-13 – 2012-05-14 (×2): 400 mg via INTRAVENOUS
  Filled 2012-05-13 (×2): qty 250

## 2012-05-13 NOTE — Progress Notes (Signed)
EPW removed at this time per MD order; pt tolerated well; no oozing noted; bedrest until 0940; will cont. To monitor.

## 2012-05-13 NOTE — Progress Notes (Signed)
Pt ambulated 500 feet in hallway with RN and rolling walker without difficulty; pt assisted back to room to sit in chair; pt encouraged to ambulate again after dinner; will cont. To monitor.

## 2012-05-13 NOTE — Progress Notes (Signed)
Pt denies nausea at this time; will cont. To monitor. 

## 2012-05-13 NOTE — Progress Notes (Addendum)
[address]            [place] [address]          [phone]     6 Days Post-Op  Procedure(s) (LRB): ASCENDING AORTIC ROOT REPLACEMENT (N/A) CORONARY ARTERY BYPASS GRAFTING (CABG) (N/A) Subjective: Feels better, some nausea yesterday  Objective  Telemetry sinus rhythm  Temp:  [98.7 F (37.1 C)-101.7 F (38.7 C)] 99.7 F (37.6 C) (07/18 0625) Pulse Rate:  [83-93] 83  (07/18 0625) Resp:  [19-20] 19  (07/18 0625) BP: (102-131)/(59-80) 131/80 mmHg (07/18 0625) SpO2:  [92 %-97 %] 94 % (07/18 0625) Weight:  [315 lb 4.1 oz (143 kg)] 315 lb 4.1 oz (143 kg) (07/18 0625)   Intake/Output Summary (Last 24 hours) at 05/13/12 0724 Last data filed at 05/12/12 1700  Gross per 24 hour  Intake    840 ml  Output      0 ml  Net    840 ml       General appearance: alert, cooperative and no distress Heart: regular rate and rhythm and S1, S2 normal Lungs: diminished in bases Abdomen: soft, nontender, + BS Extremities: minor edema Wound: incisions healing well  Lab Results:  Basename 05/12/12 0520 05/11/12 0416  NA 140 138  K 3.9 3.7  CL 103 103  CO2 27 27  GLUCOSE 90 82  BUN 20 25*  CREATININE 0.91 0.86  CALCIUM 8.3* 8.2*  MG -- --  PHOS -- --   No results found for this basename: AST:2,ALT:2,ALKPHOS:2,BILITOT:2,PROT:2,ALBUMIN:2 in the last 72 hours No results found for this basename: LIPASE:2,AMYLASE:2 in the last 72 hours  Basename 05/12/12 0520 05/11/12 0416  WBC 10.9* 10.3  NEUTROABS -- --  HGB 8.6* 8.8*  HCT 26.0* 25.7*  MCV 86.4 85.1  PLT 244 163   No results found for this basename: CKTOTAL:4,CKMB:4,TROPONINI:4 in the last 72 hours No components found with this basename: POCBNP:3 No results found for this basename: DDIMER in the last 72 hours No results found for this basename: HGBA1C in the last 72 hours No results found for this basename: CHOL,HDL,LDLCALC,TRIG,CHOLHDL in the last 72 hours No results found for this  basename: TSH,T4TOTAL,FREET3,T3FREE,THYROIDAB in the last 72 hours No results found for this basename: VITAMINB12,FOLATE,FERRITIN,TIBC,IRON,RETICCTPCT in the last 72 hours  Medications: Scheduled    . amiodarone  200 mg Oral Q12H  . aspirin EC  325 mg Oral Daily  . atorvastatin  20 mg Oral q1800  . docusate sodium  200 mg Oral Daily  . enoxaparin  40 mg Subcutaneous Q24H  . furosemide  40 mg Oral Daily  . insulin aspart  0-24 Units Subcutaneous TID AC & HS  . lactulose  20 g Oral Once  . metFORMIN  500 mg Oral BID WC  . metoprolol tartrate  12.5 mg Oral BID  . mupirocin ointment  1 application Nasal BID  . pantoprazole  40 mg Oral QAC breakfast  . potassium chloride  20 mEq Oral Daily  . sodium chloride  3 mL Intravenous Q12H  . DISCONTD: amiodarone  400 mg Oral Q12H  . DISCONTD: amiodarone  400 mg Oral Daily  . DISCONTD: insulin glargine  20 Units Subcutaneous BID     Radiology/Studies:  Dg Chest 2 View  05/12/2012  *RADIOLOGY REPORT*  Clinical Data: Chest pain, post chest tube removal  CHEST - 2 VIEW  Comparison: 05/11/2012  Findings:  Patchy retrocardiac/left lower lobe opacity, atelectasis versus pneumonia.  Small left pleural effusion.  No pneumothorax.  Cardiomegaly.  Prior pneumomediastinum is not well visualized on the current study and may have resolved.  Postsurgical changes related to prior CABG.  IMPRESSION: Left lower lobe opacity, atelectasis versus pneumonia.  Associated small left pleural effusion.  No pneumothorax is seen.  Original Report Authenticated By: Charline Bills, M.D.    INR: Will add last result for INR, ABG once components are confirmed Will add last 4 CBG results once components are confirmed  Assessment/Plan: S/P Procedure(s) (LRB): ASCENDING AORTIC ROOT REPLACEMENT (N/A) CORONARY ARTERY BYPASS GRAFTING (CABG) (N/A)   1. Fevers, wbc normal yesterday, cxr shows atx, with poss infiltrate/pneumonia. No urine sx. Will check ua and start on avelox.  Add flutter valve.and cont pulm toilet. Repeat WBC in am. CXR in am 2. Rhythm stable 3. Cont diuresis 4 routine rehab    LOS: 6 days    GOLD,WAYNE E 7/18/20137:24 AM    Patient feels well Incision well healed No murmur Wait on d/c until no fever Fu chest xray I have seen and examined Salvadore Dom and agree with the above assessment  and plan.  Delight Ovens MD Beeper [phone] Office [phone] 05/13/2012 9:34 AM

## 2012-05-13 NOTE — Progress Notes (Signed)
Due to nausea at this time pt not wanting to ambulate at this time; will retry later this afternoon.

## 2012-05-13 NOTE — Progress Notes (Signed)
Pt c/o nausea; pt given 4mg PO Zofran at this time; will cont. To monitor. 

## 2012-05-14 ENCOUNTER — Inpatient Hospital Stay (HOSPITAL_COMMUNITY): Payer: BC Managed Care – PPO

## 2012-05-14 LAB — GLUCOSE, CAPILLARY
Glucose-Capillary: 96 mg/dL (ref 70–99)
Glucose-Capillary: 106 mg/dL — ABNORMAL HIGH (ref 70–99)
Glucose-Capillary: 102 mg/dL — ABNORMAL HIGH (ref 70–99)
Glucose-Capillary: 94 mg/dL (ref 70–99)

## 2012-05-14 LAB — CBC
HCT: 26.4 % — ABNORMAL LOW (ref 39.0–52.0)
Hemoglobin: 8.9 g/dL — ABNORMAL LOW (ref 13.0–17.0)
MCH: 29.3 pg (ref 26.0–34.0)
MCHC: 33.7 g/dL (ref 30.0–36.0)
MCV: 86.8 fL (ref 78.0–100.0)
Platelets: 280 10*3/uL (ref 150–400)
RBC: 3.04 MIL/uL — ABNORMAL LOW (ref 4.22–5.81)
RDW: 14.5 % (ref 11.5–15.5)
WBC: 9.5 10*3/uL (ref 4.0–10.5)

## 2012-05-14 MED ORDER — MOXIFLOXACIN HCL 400 MG PO TABS
400.0000 mg | ORAL_TABLET | Freq: Every day | ORAL | Status: DC
  Filled 2012-05-14: qty 1

## 2012-05-14 MED ORDER — MOXIFLOXACIN HCL 400 MG PO TABS
400.0000 mg | ORAL_TABLET | Freq: Every day | ORAL | Status: DC
  Administered 2012-05-15: 400 mg via ORAL
  Filled 2012-05-14: qty 1

## 2012-05-14 MED ORDER — MOXIFLOXACIN HCL 400 MG PO TABS: 400.0000 mg | ORAL_TABLET | Freq: Every day | ORAL | Status: AC

## 2012-05-14 NOTE — Progress Notes (Signed)
[id number] Cardiac Rehab Completed discharge education with pt and wife. Pt agrees to [facility] in [place],  will send referral.

## 2012-05-14 NOTE — Discharge Summary (Signed)
[address]            [place] [address]          [phone]         Discharge Summary-Addendum  Name: Jonathan Miles DOB: [birth year] 51 y.o. MRN: [id number]   Admission Date: 05/07/2012 Discharge Date:    Additional Discharge Diagnoses: Fever Atelectasis   Addendum to Hospital Course:   The patient was originally scheduled for discharge home on 05/13/2012. However, he developed fever and a slight elevation in his white blood cell count. Chest x-ray showed increasing left lower lobe opacity which was thought to be atelectasis vs. pneumonia. A urinalysis was negative. He was started empirically on Avelox and kept in the hospital for further observation. He also was noted to have some increasing nausea which is thought to be related to his amiodarone. His dosage was decreased and his GI symptoms improved. His fever has resolved and his white blood cell count is trending downward, at 9500 on 05/14/2012. He has otherwise remained stable and no other changes occurred from the previously dictated discharge summary. Hopefully if he continues to progress, he will be discharged home within the next 24 hours.    Recent vital signs:  Filed Vitals:   05/14/12 0438  BP: 127/76  Pulse: 78  Temp: 99.5 F (37.5 C)  Resp: 20    Recent laboratory studies:  CBC: Basename 05/14/12 0605 05/12/12 0520  WBC 9.5 10.9*  HGB 8.9* 8.6*  HCT 26.4* 26.0*  PLT 280 244   BMET:  Basename 05/12/12 0520  NA 140  K 3.9  CL 103  CO2 27  GLUCOSE 90  BUN 20  CREATININE 0.91  CALCIUM 8.3*    PT/INR: No results found for this basename: LABPROT,INR in the last 72 hours   Discharge Medications:   Medication List  As of 05/14/2012  9:34 AM   STOP taking these medications         lisinopril 20 MG tablet      lisinopril-hydrochlorothiazide 20-25 MG per tablet      metoprolol succinate 100 MG 24 hr tablet      simvastatin 40 MG tablet      vitamin E  400 UNIT capsule         TAKE these medications         acetaminophen 500 MG tablet   Commonly known as: TYLENOL   Take 500 mg by mouth every 6 (six) hours as needed. For pain.      amiodarone 200 MG tablet   Commonly known as: PACERONE   Take 1 tablet (200 mg total) by mouth every 12 (twelve) hours.      aspirin 325 MG EC tablet   Take 1 tablet (325 mg total) by mouth daily.      atorvastatin 20 MG tablet   Commonly known as: LIPITOR   Take 1 tablet (20 mg total) by mouth daily at 6 PM.      felodipine 10 MG 24 hr tablet   Commonly known as: PLENDIL   Take 10 mg by mouth every evening.      furosemide 40 MG tablet   Commonly known as: LASIX   Take 1 tablet (40 mg total) by mouth daily. For one week then stop.      metFORMIN 500 MG tablet   Commonly known as: GLUCOPHAGE   Take  500 mg by mouth 2 (two) times daily with a meal.      metoprolol tartrate 25 MG tablet   Commonly known as: LOPRESSOR   Take 0.5 tablets (12.5 mg total) by mouth 2 (two) times daily.      moxifloxacin 400 MG tablet   Commonly known as: AVELOX   Take 1 tablet (400 mg total) by mouth daily at 6 PM.      oxyCODONE 5 MG immediate release tablet   Commonly known as: Oxy IR/ROXICODONE   Take 1-2 tablets (5-10 mg total) by mouth every 4 (four) hours as needed for pain.      potassium chloride SA 20 MEQ tablet   Commonly known as: K-DUR,KLOR-CON   Take 1 tablet (20 mEq total) by mouth daily. For one week then stop.             Discharge Instructions:  The patient is to refrain from driving, heavy lifting or strenuous activity.  May shower daily and clean incisions with soap and water.  May resume regular diet.   Follow Up:  Discharge Orders    Future Appointments: Provider: Department: Dept Phone: Center:   06/03/2012 4:00 PM Delight Ovens, MD [facility] [place] [phone] [facility]   06/21/2012 8:30 AM Laurey Morale, MD [facility] [phone] [facility]      Follow-up  Information    Follow up with Tonny Bollman, MD. (Call for a follow up appointment for 2 weeks)    Contact information:   [address] [place] [address] [phone]       Follow up with Sheliah Plane B, MD. (PA/LAT CXR to be taken on 06/03/2012 (at [facility] which is in the same building as Dr. Dennie Maizes office) at 3:00 pm;Appointment with Dr. Tyrone Sage is on 06/03/2012 at 4:00 pm)    Contact information:   [address] [place] [address] [phone]           Beva Remund H 05/14/2012, 9:34 AM

## 2012-05-14 NOTE — Progress Notes (Signed)
Pt ambulated 500 feet with rolling walker and wife without difficulty; pt transferred to 2035; will cont. To monitor.

## 2012-05-14 NOTE — Care Management Note (Signed)
    Page 1 of 1   05/14/2012     2:48:00 PM   CARE MANAGEMENT NOTE 05/14/2012  Patient:  Jonathan Miles, Jonathan Miles   Account Number:  [id number]  Date Initiated:  05/11/2012  Documentation initiated by:  Shahrukh Pasch  Subjective/Objective Assessment:   PT S/P ASCENDING AORTIC ROOT REPLACEMENT AND CABG X 1 ON 05/07/12.  PTA, PT INDEPENDENT, LIVES AT HOME WITH SPOUSE.     Action/Plan:   MET WITH PT AND FAMILY TO DISCUSS DC PLANS.  WIFE TO PROVIDE 24HR CARE AT DISCHARGE.  SHE HAS CONCERNS, AS SHE IS DISABLED AND CANNOT LIFT PT.  WILL CONSULT PHYSICAL THERAPY TO ASSESS HOME NEEDS FOR DC.   Anticipated DC Date:  05/15/2012   Anticipated DC Plan:  HOME W HOME HEALTH SERVICES      DC Planning Services  CM consult      Choice offered to / List presented to:     DME arranged  [facility]      DME agency  [facility]        Status of service:  Completed, signed off Medicare Important Message given?   (If response is "NO", the following Medicare IM given date fields will be blank) Date Medicare IM given:   Date Additional Medicare IM given:    Discharge Disposition:  HOME/SELF CARE  Per UR Regulation:    If discussed at Long Length of Stay Meetings, dates discussed:    Comments:  05/14/12 Jenan Ellegood,RN,BSN 1430 PT REQUESTS DME FOR HOME.  REFERRAL TO [facility] FOR DME NEEDS. LIKELY DC OVER THE WEEKEND.

## 2012-05-14 NOTE — Progress Notes (Signed)
Pt ambulated 550 feet in hallway with RN and rolling walker; pt assisted to chair; call bell w/i reach; will cont. To monitor.

## 2012-05-14 NOTE — Progress Notes (Addendum)
7 Days Post-Op Procedure(s) (LRB): ASCENDING AORTIC ROOT REPLACEMENT (N/A) CORONARY ARTERY BYPASS GRAFTING (CABG) (N/A)  Subjective: Patient.  Objective: Vital signs in last 24 hours: Patient Vitals for the past 24 hrs:  BP Temp Temp src Pulse Resp SpO2 Weight  05/14/12 0438 127/76 mmHg 99.5 F (37.5 C) Oral 78  20  95 % 313 lb 0.9 oz (142 kg)  05/13/12 1951 128/79 mmHg 99.6 F (37.6 C) Oral 80  19  96 % -  05/13/12 1339 - - - 80  - - -  05/13/12 1328 127/78 mmHg 99.2 F (37.3 C) Oral 159  18  95 % -  05/13/12 1004 118/65 mmHg - - 84  - - -  05/13/12 1002 129/63 mmHg - - 84  - - -  05/13/12 0922 116/65 mmHg - - 87  - - -  05/13/12 0835 141/74 mmHg - - 86  - - -  05/13/12 0831 141/68 mmHg - - 86  18  - -   Pre op weight 138 kg Current Weight  05/14/12 313 lb 0.9 oz (142 kg)      Intake/Output from previous day: 07/18 0701 - 07/19 0700 In: 243 [P.O.:240; I.V.:3] Out: 400 [Urine:400]   Physical Exam:  Cardiovascular: RRR, no murmurs, gallops, or rubs. Pulmonary: Diminished at bases; no rales, wheezes, or rhonchi. Abdomen: Soft, non tender, bowel sounds present. Extremities: Mild bilateral lower extremity edema. Wounds: Clean and dry.  No erythema or signs of infection.  Lab Results: CBC:  Basename 05/14/12 0605 05/12/12 0520  WBC 9.5 10.9*  HGB 8.9* 8.6*  HCT 26.4* 26.0*  PLT 280 244   BMET:   Basename 05/12/12 0520  NA 140  K 3.9  CL 103  CO2 27  GLUCOSE 90  BUN 20  CREATININE 0.91  CALCIUM 8.3*     Assessment/Plan:  1. CV - SR.Continue Lopressor 12.5 bid. Also, on Amiodarone 200 bid. 2.  Pulmonary - Encourage incentive spirometer. CXR this am shows cardiomegaly, no pneumothorax, and small LLL opacity on lateral view ( probable atelectasis). 3. Volume Overload - Continue with diuresis. 4.  Acute blood loss anemia - H and H 8.9 and 26.4. 5.DM-CBGs 96/114/96.Pre op HGA1C 6.1.Continue Metformin bid. 6.Still with low grade fever ( T max 99.6)-WBC  within normal at 9500, UA negative for UTI. Fever likely secondary to atelectasis.On Avelox (day#2). 7.Possible discharge in am.  ZIMMERMAN,DONIELLE MPA-C 05/14/2012 7:34 AM   Wounds intact without evidence of infection No murmur Chest xray improved, on po avalox but doubt definite pneumonia Poss home in am I have seen and examined Salvadore Dom and agree with the above assessment  and plan.  Delight Ovens MD Beeper [phone] Office [phone] 05/14/2012 10:12 AM

## 2012-05-14 NOTE — Progress Notes (Signed)
Chest tube sutures removed at this time per PA order; steri strips applied to site; no bleeding noted; pt sitting in chair; will cont. To monitor.

## 2012-05-15 LAB — GLUCOSE, CAPILLARY
Glucose-Capillary: 100 mg/dL — ABNORMAL HIGH (ref 70–99)
Glucose-Capillary: 101 mg/dL — ABNORMAL HIGH (ref 70–99)
Glucose-Capillary: 154 mg/dL — ABNORMAL HIGH (ref 70–99)

## 2012-05-15 MED ORDER — BISACODYL 10 MG RE SUPP
10.0000 mg | Freq: Once | RECTAL | Status: AC
  Administered 2012-05-15: 10 mg via RECTAL
  Filled 2012-05-15: qty 1

## 2012-05-15 NOTE — Progress Notes (Signed)
Pt discharged per MD order, discharged papers reviewed with pt and wife. All Rx'es and Follow up appointments scheduled. Pt and Wife have view discharge video. All questions answered.

## 2012-05-15 NOTE — Progress Notes (Signed)
Pt ambulated 350 ft and tolerated activity well. Will continue to monitor 

## 2012-05-15 NOTE — Progress Notes (Signed)
Pt ambulated 550 feet with front wheel walker without complication. Pt tolerated well. Will continue to monitor.

## 2012-05-15 NOTE — Progress Notes (Signed)
CARDIAC REHAB PHASE I   PRE:  Rate/Rhythm81  SR  BP:  Supine:   Sitting: 124/76  Standing:    SaO2: 94 RA  MODE:  Ambulation: 550 ft   POST:  Rate/Rhythem: 84  BP:  Supine:   Sitting: 120/72  Standing:    SaO2: 98 RA  2:13 pm to 2:52 pm  Ambulated with assist x 1.  Pace slow , gait steady with rolling walker.  He did complain of some tenderness in left thigh area, bruising still present below incision line.  He feels this may be increasing.  Area was marked for him to observe. No tenderness area soft.  Some soreness when he stands.  Returned to room, sitting on side of bed.  Encouraged to walk again later today x 2.  Call bell within reach.  Jackey Loge

## 2012-05-15 NOTE — Progress Notes (Addendum)
8 Days Post-Op Procedure(s) (LRB): ASCENDING AORTIC ROOT REPLACEMENT (N/A) CORONARY ARTERY BYPASS GRAFTING (CABG) (N/A)  Subjective: Patient being sponged bathed this am.Nurse reported he had episode of nausea and emesis x1 after he opened his food tray.He denies abdominal pain, nausea, or emesis at this time.  Objective: Vital signs in last 24 hours: Patient Vitals for the past 24 hrs:  BP Temp Temp src Pulse Resp SpO2 Weight  05/15/12 0508 129/79 mmHg 98.7 F (37.1 C) Oral 83  18  93 % 310 lb 10.1 oz (140.9 kg)  05/14/12 2006 117/70 mmHg 98.9 F (37.2 C) Oral 76  28  93 % -  05/14/12 1359 122/78 mmHg 98.7 F (37.1 C) Oral 73  18  94 % -  05/14/12 0956 124/67 mmHg - - 76  20  - -   Pre op weight 138 kg Current Weight  05/15/12 310 lb 10.1 oz (140.9 kg)      Intake/Output from previous day: 07/19 0701 - 07/20 0700 In: 120 [P.O.:120] Out: -    Physical Exam:  Cardiovascular: RRR, no murmurs, gallops, or rubs. Pulmonary: Diminished at bases; no rales, wheezes, or rhonchi. Abdomen: Soft, non tender, bowel sounds present. Extremities: Mild bilateral lower extremity edema. Wounds: Clean and dry.  No erythema or signs of infection.  Lab Results: CBC:  Basename 05/14/12 0605  WBC 9.5  HGB 8.9*  HCT 26.4*  PLT 280   BMET:  No results found for this basename: NA:2,K:2,CL:2,CO2:2,GLUCOSE:2,BUN:2,CREATININE:2,CALCIUM:2 in the last 72 hours   Assessment/Plan:  1. CV - SR.Continue Lopressor 12.5 bid. Also, on Amiodarone 200 bid. 2.  Pulmonary - Encourage incentive spirometer. On Avelox (Day# 3) although no definite pneumonia.Will give for a total of 5 days. 3. Volume Overload - Continue with diuresis. 4.  Acute blood loss anemia - H and H 8.9 and 26.4. 5.DM-CBGs 102/94/100.Pre op HGA1C 6.1.Continue Metformin bid. 6.Low grade fever resolved.-WBC within normal at 9500, UA negative for UTI. Fever likely secondary to atelectasis. 7.GI- monitor for further  nausea. 8.Questionable discharge later today vs am.  ZIMMERMAN,DONIELLE MPA-C 05/15/2012 9:07 AM   No BM since Wednesday Will give dulcolax suppository this AM If able to eat lunch will d/c this afternoon

## 2012-05-15 NOTE — Op Note (Signed)
NAME:  Jonathan Miles, Jonathan Miles NO.:  [id number]  MEDICAL RECORD NO.:  [id number]  LOCATION:  2035                         FACILITY:  [facility]  PHYSICIAN:  Sheliah Plane, MD    DATE OF BIRTH:  01-17-1961  DATE OF PROCEDURE:  05/07/2012 DATE OF DISCHARGE:                              OPERATIVE REPORT   PREOPERATIVE DIAGNOSIS:  Dilated aortic root.  POSTOPERATIVE DIAGNOSIS:  Dilated aortic root.  SURGICAL PROCEDURE:  Valve-sparing aortic root replacement and coronary artery bypass grafting x1 with second reverse saphenous vein graft to the distal right coronary artery with right thigh endo vein harvesting.  SURGEON:  Sheliah Plane, MD  FIRST ASSISTANT:  Kerin Perna, MD  SECOND ASSISTANT:  Rowe Clack, PA-C  BRIEF HISTORY:  The patient is a 51 year old male who has been followed approximately 1 year with incidentally found dilated aortic root without evidence of dissection.  The patient originally presented with vague left arm pain and further cardiac evaluation proceeded.  He was found to have a dilated aortic root to approximately 6 cm without aortic insufficiency and with a trileaflet aortic valve by echocardiogram. Cardiac catheterization was performed.  In addition, the patient had 60% to 65% proximal right coronary artery obstruction.  His left main coronary artery was relatively short, but had coronary artery disease. Because of the size of the dilated aortic root, elective repair of his aortic root was recommended.  Options of valve replacement including valve-sparing aortic root replacement and mechanical tissue valve was discussed with the patient in detail.  He had no absolute contraindications to Coumadin, but preferred not to take Coumadin. Risks and options of the procedure were explained in detail.  The patient agreed and signed informed consent.  DESCRIPTION OF PROCEDURE:  With Swan-Ganz and arterial line monitors in place, the patient  underwent general endotracheal anesthesia by Dr. Michelle Piper.  Transesophageal echo probe was placed and confirmed the preoperative findings of a significantly dilated aortic root approaching 6 cm.  The valve with mild aortic insufficiency and a trileaflet valve and preserved LV function, but with evidence of left ventricular hypertrophy.  The skin of the chest and legs were prepped with Betadine and draped in the usual sterile manner.  Using the [employer] endo vein harvesting system, a segment of vein was harvested from the right thigh and was of good quality and caliber.  Median sternotomy was performed. Pericardium was opened, and as the preoperative CT and echo revealed the patient had a significantly dilated aortic root just above the root is fairly rapidly tapered to a more normal size aorta allowing plenty of room for aortic cannulation in the chest.  The patient was systemically heparinized.  The ascending aorta was cannulated.  The right atrium was cannulated with a dual venous cannula.  Retrograde cardioplegia catheter was placed.  TEE did not reveal any evidence of patent foramen.  An aortic root vent cardioplegia needle was introduced into the ascending aorta.  Aortic crossclamp was applied and 800 mL of cold blood potassium cardioplegia was administered with diastolic arrest of the heart.  The right superior pulmonary vein vent had been placed.  Cardioplegia was also administered intermittently and measured up intermittently  administered while the cross-clamp was in place.  We turned our attention to the coronary disease.  The distal right coronary artery was opened with a 1.5 mm probe.  Using a running 7-0 Prolene, distal anastomosis was performed.  Additional cold blood cardioplegia administered down the vein graft.  Attention was then turned towards the aortic root.  Transverse aortotomy was performed, which gave exposure to the aortic valve with a trileaflet valve and a  significantly dilated aortic root.  The aorta was transected anteriorly from the right ventricle and pulmonary artery with significant amount of root freed up. Coronary buttons were developed for the right and left coronary ostium. Prior to developing the coronary buttons, a standard aortic valve Sizer was used to estimate the proper size of the reconstructed valve en route.  With this measurement, a 30 mm [employer] Valsalva graft was selected.  The coronary buttons were developed.  The aortic root was cut away leaving the aortic valve and commissures.  4-0 Prolene sutures were placed in each of the commissures.  #2 Tycron sutures, a total of 6 were placed just below the aortic valve in horizontal plane and brought out to attach the Valsalva graft.  The graft was trimmed to an appropriate length.  Each of the stay sutures were placed through the proximal ring of the graft.  The struts of each valve commissure with Prolene sutures were placed through the inside of the graft and the graft was carefully tied in place.  Each of the 3 Prolene sutures at each commissure were then placed through the Valsalva graft at the appropriate level to allow good coaptation of the valve.  At the base of the each of the commissures, the remnant of the aorta was then sewed with a running 4-0 Prolene of each of the struts to create a hemostatic suture line.  Each of these commissure sutures were secured to the graft.  This was repeated for each of the sinuses.  At this point, passive filling of the aortic root left a competent aortic valve.  The buttons from left coronary ostium had been freed and opening in the graft was created and using a running 5-0 Prolene the left coronary button was attached to the graft with a running suture.  The graft was then trimmed to the appropriate length and the distal anastomosis was performed with a running 3-0 Prolene with the felt strip to complete the  distal anastomosis.  The right coronary button was then opened and the right button was sewed to the graft.  A single punch aortotomy in the graft was created to anastomose the right coronary vein graft to the ascending aorta.  The heart was allowed to passively fill, warm retrograde cardioplegia was also administered.  De-airing the heart graft was completed and aortic crossclamp was removed.  Total cross-clamp time of 265 minutes.  The patient required electric defibrillation to return to a sinus rhythm.  It was atrially and ventricularly paced and then atrially paced for rhythm to increase his rate.  With vents still in place, heart was allowed to passively fill.  The mitral valve appeared competent.  There was trace to mild insufficiency noted along the cusp between the left and the non coronary cusp.  It was not felt that this was significant enough to redo the procedure.  The patient was rewarmed, was ventilated, vent was removed, was weaned from cardiopulmonary bypass without difficulty and remained hemodynamically stable.  He was decannulated, protamine sulfate was administered.  TEE  revealed good left ventricular function.  Pericardium was reapproximated.  Two Blake mediastinal drains were left in place and the sternum was closed.  The skin was closed.  Prior to removal of the drapes without any change in hemodynamics, the patient without arrhythmias defibrillated, the chest was immediately re-opened.  Internal defibrillator paddles were used to cardiovert the patient and after a brief period of CPR and defibrillation, the patient returned to a stable rhythm.  He was loaded with amiodarone.  TEE initially showed globally depressed LV function that quickly returned to baseline on low-dose dopamine.  EKG showed no ST changes, and the patient returned to a stable hemodynamics with good cardiac index.  The TEE showed no change and function of the valve just prior to separation from  bypass period of observation confirmed the patient's hemodynamic stability.  He did not require being put back on bypass and again the chest was reclosed, although no air bubbles were noted in the right vein graft or in the coronaries themselves, so it was presumed that the arrhythmia was based on retained air in the coronary. He remained stable.  His pericardium was reapproximated.  Sternum was closed over 6 stainless steel wire.  Fascia closed with interrupted 0 Vicryl, running 3-0 Vicryl in subcutaneous tissue, 3-0 subcuticular stitch in skin edges.  Dry dressings were applied.  Sponge and needle count was reported as correct at the completion of the procedure.  Total pump time was 312 minutes.  The patient did not require any blood products during the operative procedure, and was transferred to surgical intensive care unit in stable condition on low-dose dopamine and amiodarone infusion in sinus rhythm without further ventricular ectopy.     Sheliah Plane, MD     EG/MEDQ  D:  05/14/2012  T:  05/15/2012  Job:  [id number]

## 2012-05-25 ENCOUNTER — Telehealth: Payer: Self-pay | Admitting: Cardiology

## 2012-05-25 NOTE — Telephone Encounter (Signed)
Spoke with pt's wife. I have given pt an appt 05/26/12 at 10:10am with Lilian Coma.

## 2012-05-25 NOTE — Telephone Encounter (Signed)
Pt discharged from hospital 05/16/12 following aortic root replacement and CABG. Pt's wife states she spoke with Dr Dennie Maizes office and they suggested she call here to discuss recent symptoms.  In the last week pt has had headaches on the right side that wake him at night, dizziness when standing up that lasts a few seconds, night sweats, and nausea. Wife states pt's BP has been between 131/81 and 148/85. Pt's highest temperature 99.1 since discharge. Pt has poor appetite but wife states he is able to drink fluids. I will forward to Dr Shirlee Latch for review and recommendations.

## 2012-05-25 NOTE — Telephone Encounter (Signed)
New Problem:    Patient is having headaches, night sweats, dizziness, nausea and would like to consult with you about this.  Please call back.

## 2012-05-25 NOTE — Telephone Encounter (Signed)
Have him seen in office in the next couple of days by me or PA.

## 2012-05-26 ENCOUNTER — Encounter: Payer: Self-pay | Admitting: Physician Assistant

## 2012-05-26 ENCOUNTER — Ambulatory Visit (INDEPENDENT_AMBULATORY_CARE_PROVIDER_SITE_OTHER): Payer: BC Managed Care – PPO | Admitting: Physician Assistant

## 2012-05-26 VITALS — BP 121/78 | HR 90 | Ht 73.0 in | Wt 289.0 lb

## 2012-05-26 DIAGNOSIS — R059 Cough, unspecified: Secondary | ICD-10-CM

## 2012-05-26 DIAGNOSIS — I10 Essential (primary) hypertension: Secondary | ICD-10-CM

## 2012-05-26 DIAGNOSIS — R51 Headache: Secondary | ICD-10-CM

## 2012-05-26 DIAGNOSIS — I77810 Thoracic aortic ectasia: Secondary | ICD-10-CM

## 2012-05-26 DIAGNOSIS — D649 Anemia, unspecified: Secondary | ICD-10-CM

## 2012-05-26 DIAGNOSIS — I2510 Atherosclerotic heart disease of native coronary artery without angina pectoris: Secondary | ICD-10-CM

## 2012-05-26 DIAGNOSIS — R05 Cough: Secondary | ICD-10-CM

## 2012-05-26 LAB — CBC WITH DIFFERENTIAL/PLATELET
Basophils Absolute: 0.1 10*3/uL (ref 0.0–0.1)
Basophils Relative: 0.9 % (ref 0.0–3.0)
Eosinophils Absolute: 0.1 10*3/uL (ref 0.0–0.7)
Eosinophils Relative: 0.8 % (ref 0.0–5.0)
HCT: 31.3 % — ABNORMAL LOW (ref 39.0–52.0)
Hemoglobin: 10.3 g/dL — ABNORMAL LOW (ref 13.0–17.0)
Lymphocytes Relative: 14.2 % (ref 12.0–46.0)
Lymphs Abs: 1.1 10*3/uL (ref 0.7–4.0)
MCHC: 33 g/dL (ref 30.0–36.0)
MCV: 85.8 fl (ref 78.0–100.0)
Monocytes Absolute: 0.7 10*3/uL (ref 0.1–1.0)
Monocytes Relative: 9 % (ref 3.0–12.0)
Neutro Abs: 5.9 10*3/uL (ref 1.4–7.7)
Neutrophils Relative %: 75.1 % (ref 43.0–77.0)
Platelets: 631 10*3/uL — ABNORMAL HIGH (ref 150.0–400.0)
RBC: 3.65 Mil/uL — ABNORMAL LOW (ref 4.22–5.81)
RDW: 14.5 % (ref 11.5–14.6)
WBC: 7.8 10*3/uL (ref 4.5–10.5)

## 2012-05-26 LAB — BASIC METABOLIC PANEL
BUN: 12 mg/dL (ref 6–23)
CO2: 28 mEq/L (ref 19–32)
Calcium: 9.4 mg/dL (ref 8.4–10.5)
Chloride: 103 mEq/L (ref 96–112)
Creatinine, Ser: 0.9 mg/dL (ref 0.4–1.5)
GFR: 112.65 mL/min (ref >60.00)
Glucose, Bld: 97 mg/dL (ref 70–99)
Potassium: 4 mEq/L (ref 3.5–5.1)
Sodium: 140 mEq/L (ref 135–145)

## 2012-05-26 MED ORDER — GLUCERNA SHAKE PO LIQD: 237.0000 mL | Freq: Three times a day (TID) | ORAL | Status: DC

## 2012-05-26 MED ORDER — AMIODARONE HCL 200 MG PO TABS: 200.0000 mg | ORAL_TABLET | Freq: Every day | ORAL | Status: DC

## 2012-05-26 MED ORDER — METHOCARBAMOL 500 MG PO TABS: ORAL_TABLET | ORAL | Status: DC

## 2012-05-26 MED ORDER — ACETAMINOPHEN 500 MG PO TABS: ORAL_TABLET | ORAL | Status: DC

## 2012-05-26 NOTE — Patient Instructions (Addendum)
Your physician recommends that you return for lab work in: TODAY BMET, CBC W/DIFF  Your physician has recommended you make the following change in your medication: DECREASE AMIODARONE TO 200 MG DAILY;  START GLUCERNA SUPPLEMENTS DRINK 1 WITH EACH MEAL THREE TIMES DAILY  ROBAXIN 500 MG TAKE 1-2 TABLETS EVERY NIGHT BEFORE BEDTIME AS NEEDED FOR MUSCLE SPASMS   TYLENOL 500 1-2 TABS EVERY 6 HOURS AS NEEDED FOR PAIN  You have been referred to CARDIAC REHAB TO BE DONE @ [facility] DX CAD, HTN  KEEP APPT WITH DR. Shirlee Latch 06/21/12  CANCEL APPT WITH SCOTT WEAVER, PAC ON 06/01/12 PER SCOTT WEAVER, PAC  I WILL CALL [facility]ED TO SEE ABOUT RECLINER

## 2012-05-26 NOTE — Progress Notes (Signed)
[address] [place], [place]  [address] Phone: [phone] Fax:  [phone]  Date:  05/26/2012   Name:  Jonathan Miles   DOB:  1961-09-08   MRN:  [id number]  PCP:  Janace Hoard, MD  Primary Cardiologist:  Dr. [name]  Primary Electrophysiologist:  None    History of Present Illness: Jonathan Miles is a 51 y.o. male who returns for evaluation of headaches.  He has a history of ascending aortic aneurysm and CAD.  He underwent aortic valve sparing ascending aortic root replacement with CABG x1 (SVG-RCA) on 05/07/12 with Dr. Tyrone Sage. He was in the hospital from 7/12-7/19. He was placed on prophylactic amiodarone and low-dose beta blocker. He had blood loss anemia but did not require transfusion. He maintained sinus rhythm. He was actually kept in the hospital longer than planned due to fever and elevated white count and a chest x-ray demonstrating left lower lobe opacity thought to be atelectasis versus pneumonia. He was treated with Avelox. Amiodarone dose was reduced due to complaints of nausea. He called in recently with complaints of headaches and dizziness as well as night sweats and nausea. His temperature was recorded at 99.1.   He has been awakening every night around 4 to 5 AM with a headache. It is right-sided and starts in his trapezius area and wraps to his frontal area. He does have some stiffness in his neck as well. He has never been able to sleep on his back. Since his surgery, he has only been able to sleep on his back. There was a day when he was able to lay on his left side and this was the only time he did not have a headache. He denies any facial droop or difficulty with speech. He does have some ulnar nerve injury type symptoms in his right hand. Otherwise he denies any unilateral weakness. His chest is sore. He denies any significant dyspnea. Denies syncope. He has not had any fevers. Avelox was not continued after discharge. I believe this  was related to an interaction with amiodarone. He has a cough that is nonproductive. Blood pressures have been stable. Weights have been decreasing. His appetite is poor.  Labs (3/12): K 4, creatinine 1.03, LDL 113, HDL 33  Labs (4/12): K 3.7, creatinine 0.8  Labs (1/13): K 3.6, creatinine 0.9, LDL 133, HDL 42  Labs (7/13):  K 3.9, creatinine 0.91, Hgb 8.9   Wt Readings from Last 3 Encounters:  05/26/12 289 lb (131.09 kg)  05/15/12 310 lb 10.1 oz (140.9 kg)  05/15/12 310 lb 10.1 oz (140.9 kg)     Past Medical History: 1. DM  2. Hyperlipidemia  3. HTN  4. Aortic root/ascending aortic aneurysm: 4/12 Echo with 5.4 cm aortic root; 4/12 MRA chest with 5.5 cm aortic root, 3.8 cm ascending thoracic aorta; 10/12 CTA chest with 6.1 x 5.1 cm aortic root/proximal ascending aorta, 4.2 x 4.1 cm mid ascending aorta; 1/13 Echo with 5.4 cm root, 5.5 cm proximal ascending thoracic aorta. S/p repair with CABG z 1 (S-RCA) 04/2012 with Dr. Tyrone Sage. 5. Myoview (4/12): EF 67%, no ischemia or infarction.  6. Echo (1/13): EF 55%, mild LVH, 5.4 cm aortic root, 5.5 cm proximal ascending thoracic aorta. Trileaflet aortic valve with trivial AI.  7. Possible contrast allergy  8. CAD: LHC (1/13) with long 50% D2 stenosis, 60% proximal RCA, and 50% mid RCA. S/p CABG x 1 at time of ascending aortic aneurysm repair.   Current Outpatient  Prescriptions  Medication Sig Dispense Refill  . acetaminophen (TYLENOL) 500 MG tablet Take 500 mg by mouth every 6 (six) hours as needed. For pain.      Marland Kitchen amiodarone (PACERONE) 200 MG tablet Take 1 tablet (200 mg total) by mouth every 12 (twelve) hours.  60 tablet  1  . atorvastatin (LIPITOR) 20 MG tablet Take 1 tablet (20 mg total) by mouth daily at 6 PM.  30 tablet  1  . felodipine (PLENDIL) 10 MG 24 hr tablet Take 10 mg by mouth every evening.       . metFORMIN (GLUCOPHAGE) 500 MG tablet Take 500 mg by mouth 2 (two) times daily with a meal.       . metoprolol tartrate (LOPRESSOR)  25 MG tablet Take 0.5 tablets (12.5 mg total) by mouth 2 (two) times daily.  30 tablet  1   No current facility-administered medications for this visit.   Facility-Administered Medications Ordered in Other Visits  Medication Dose Route Frequency Provider Last Rate Last Dose  . 0.9 %  sodium chloride infusion  250 mL Intravenous PRN Laurey Morale, MD      . sodium chloride 0.9 % injection 3 mL  3 mL Intravenous Q12H Laurey Morale, MD      . sodium chloride 0.9 % injection 3 mL  3 mL Intravenous PRN Laurey Morale, MD        Allergies: Allergies  Allergen Reactions  . Iodinated Diagnostic Agents Hives    Hives started 4 days after cta chest was performed, minimal relief w/ benadryl x 3 days, uncertain if reaction was actually iv contrast or other allergen,allergy tests to follow.wife will make [place] aware of results//a.calhoun    History  Substance Use Topics  . Smoking status: Former Smoker -- 1.0 packs/day for 13 years    Types: Cigarettes    Quit date: 01/23/1988  . Smokeless tobacco: Never Used  . Alcohol Use: No     ROS:  Please see the history of present illness.   He has some nausea. He vomited once last week. He also notes dizziness. He had some diarrhea related to stool softeners. He has had some night sweats as well.  All other systems reviewed and negative.   PHYSICAL EXAM: VS:  BP 121/78  Pulse 90  Ht 6\' 1"  (1.854 m)  Wt 289 lb (131.09 kg)  BMI 38.13 kg/m2 Well nourished, well developed, in no acute distress HEENT: normal Neck: no JVD Cardiac:  normal S1, S2; RRR; no murmur Chest: Median sternotomy scar well-healed without erythema or discharge Lungs:  clear to auscultation bilaterally, no wheezing, rhonchi or rales Abd: soft, nontender, no hepatomegaly Ext: no edema Skin: warm and dry Neuro:  CNs 2-12 intact, no focal abnormalities noted  EKG:  Sinus rhythm, heart rate 89, normal axis, nonspecific ST-T wave changes      ASSESSMENT AND PLAN:  1.   Ascending Aortic Aneurysm, s/p Repair 05/08/12 Progressing slowly. Appetite is poor.  I have recommended glucerna tid. Refer to cardiac rehab. He may be experiencing side effects from amiodarone (nausea, dizziness).  He has been on this long enough now that we can decrease his dose.  Decrease Amiodarone to 200 mg QD. Follow up with Dr. Tyrone Sage next week and Dr. [name] later in August.  2.  CAD, s/p CABG x 1 05/08/12 Stable.  Continue ASA and statin.  3.  Hypertension Controlled.  Continue current therapy.  Check BMET today.  4.  Headache  I believe this is related to trapezius spasm. Try Robaxin 500 mg 1-2 QHS prn. We will see if we can get an order for a recliner to use for the next several weeks until he can return to sleeping on his side.  5.  Cough Improved. Never started on Avelox.  Follow up CBC today. Exam is normal.  Unless he has a fever or increased cough, I do not think he needs antibiotics.  6.  Post op Anemia Follow up CBC today.  Signed, Tereso Newcomer, PA-C  10:36 AM 05/26/2012

## 2012-05-27 ENCOUNTER — Other Ambulatory Visit: Payer: Self-pay | Admitting: Cardiothoracic Surgery

## 2012-05-27 DIAGNOSIS — I2510 Atherosclerotic heart disease of native coronary artery without angina pectoris: Secondary | ICD-10-CM

## 2012-06-01 ENCOUNTER — Ambulatory Visit: Payer: BC Managed Care – PPO | Admitting: Physician Assistant

## 2012-06-03 ENCOUNTER — Encounter (HOSPITAL_COMMUNITY)
Admission: RE | Admit: 2012-06-03 | Discharge: 2012-06-03 | Disposition: A | Discharge status: Home / Self Care | Payer: BC Managed Care – PPO | Source: Ambulatory Visit | Attending: Cardiology | Admitting: Cardiology

## 2012-06-03 ENCOUNTER — Ambulatory Visit
Admission: RE | Admit: 2012-06-03 | Discharge: 2012-06-03 | Disposition: A | Discharge status: Home / Self Care | Payer: BC Managed Care – PPO | Source: Ambulatory Visit | Attending: Cardiothoracic Surgery | Admitting: Cardiothoracic Surgery

## 2012-06-03 ENCOUNTER — Ambulatory Visit (INDEPENDENT_AMBULATORY_CARE_PROVIDER_SITE_OTHER): Payer: Self-pay | Admitting: Cardiothoracic Surgery

## 2012-06-03 ENCOUNTER — Encounter: Payer: Self-pay | Admitting: Cardiothoracic Surgery

## 2012-06-03 VITALS — BP 126/84 | HR 72 | Resp 18 | Ht 76.0 in | Wt 285.0 lb

## 2012-06-03 DIAGNOSIS — I10 Essential (primary) hypertension: Secondary | ICD-10-CM | POA: Insufficient documentation

## 2012-06-03 DIAGNOSIS — I2510 Atherosclerotic heart disease of native coronary artery without angina pectoris: Secondary | ICD-10-CM

## 2012-06-03 DIAGNOSIS — Z09 Encounter for follow-up examination after completed treatment for conditions other than malignant neoplasm: Secondary | ICD-10-CM

## 2012-06-03 DIAGNOSIS — Z5189 Encounter for other specified aftercare: Secondary | ICD-10-CM | POA: Insufficient documentation

## 2012-06-03 DIAGNOSIS — I359 Nonrheumatic aortic valve disorder, unspecified: Secondary | ICD-10-CM | POA: Insufficient documentation

## 2012-06-03 DIAGNOSIS — I77810 Thoracic aortic ectasia: Secondary | ICD-10-CM

## 2012-06-03 DIAGNOSIS — Z951 Presence of aortocoronary bypass graft: Secondary | ICD-10-CM

## 2012-06-03 NOTE — Progress Notes (Signed)
[address]            [place] [address]          [phone]       [name] [facility] Medical Record #[id number] Date of Birth: 06/15/1961  Jonathan Bollman, MD Jonathan Hoard, MD  Chief Complaint:   PostOp Follow Up Visit 05/07/2012  DATE OF DISCHARGE:  OPERATIVE REPORT  PREOPERATIVE DIAGNOSIS: Dilated aortic root.  POSTOPERATIVE DIAGNOSIS: Dilated aortic root.  SURGICAL PROCEDURE: Valve-sparing aortic root replacement and coronary  artery bypass grafting x1 with second reverse saphenous vein graft to  the distal right coronary artery with right thigh endo vein harvesting.    History of Present Illness:     Feels better with reduced dose of cordarone. Had nausea but now improving. No symptoms of heart failure. No fever or chills.   History  Smoking status  . Former Smoker -- 1.0 packs/day for 13 years  . Types: Cigarettes  . Quit date: 01/23/1988  Smokeless tobacco  . Never Used       Allergies  Allergen Reactions  . Iodinated Diagnostic Agents Hives    Hives started 4 days after cta chest was performed, minimal relief w/ benadryl x 3 days, uncertain if reaction was actually iv contrast or other allergen,allergy tests to follow.wife will make [place] aware of results//a.[email or web address]    Current Outpatient Prescriptions  Medication Sig Dispense Refill  . acetaminophen (TYLENOL) 500 MG tablet TAKE 1-2 TABS EVERY 6 HOURS AS NEEDED FOR PAIN      . amiodarone (PACERONE) 200 MG tablet Take 1 tablet (200 mg total) by mouth daily.      Marland Kitchen aspirin 325 MG tablet Take 325 mg by mouth daily.      Marland Kitchen atorvastatin (LIPITOR) 20 MG tablet Take 1 tablet (20 mg total) by mouth daily at 6 PM.  30 tablet  1  . felodipine (PLENDIL) 10 MG 24 hr tablet Take 10 mg by mouth every evening.       . metFORMIN (GLUCOPHAGE) 500 MG tablet Take 500 mg by mouth 2 (two) times daily with a meal.       . methocarbamol (ROBAXIN) 500 MG tablet TAKE 1-2 TABLETS AT  BEDTIME AS NEEDED FOR MUSCLE SPASMS  30 tablet  0  . metoprolol tartrate (LOPRESSOR) 25 MG tablet Take 0.5 tablets (12.5 mg total) by mouth 2 (two) times daily.  30 tablet  1   No current facility-administered medications for this visit.   Facility-Administered Medications Ordered in Other Visits  Medication Dose Route Frequency Provider Last Rate Last Dose  . 0.9 %  sodium chloride infusion  250 mL Intravenous PRN Laurey Morale, MD      . sodium chloride 0.9 % injection 3 mL  3 mL Intravenous Q12H Laurey Morale, MD      . sodium chloride 0.9 % injection 3 mL  3 mL Intravenous PRN Laurey Morale, MD           Physical Exam: BP 126/84  Pulse 72  Resp 18  Ht 6\' 4"  (1.93 m)  Wt 285 lb (129.275 kg)  BMI 34.69 kg/m2  SpO2 96%  General appearance: alert and cooperative Neurologic: intact Heart: regular rate and rhythm, S1, S2 normal, no murmur, click, rub or gallop, normal apical impulse, no rub and no murmur of AI Lungs: clear to auscultation bilaterally and  normal percussion bilaterally Abdomen: soft, non-tender; bowel sounds normal; no masses,  no organomegaly Extremities: extremities normal, atraumatic, no cyanosis or edema and Homans sign is negative, no sign of DVT Wound: sternum stable and wound well healed  Diagnostic Studies & Laboratory data:         Recent Radiology Findings: Dg Chest 2 View  06/03/2012  *RADIOLOGY REPORT*  Clinical Data: CABG, follow-up, some shortness of breath  CHEST - 2 VIEW  Comparison: Chest x-ray of 05/14/2012  Findings: There is persistent pleural and parenchymal opacity posteriorly at the left lung base consistent with atelectasis and effusion.  Pneumonia is difficult to exclude.  The right lung is clear.  Cardiomegaly is stable.  Median sternotomy sutures are noted from CABG.  IMPRESSION: Persistent pleural and parenchymal opacity posteriorly at the left lung base consistent with effusion and atelectasis.  Cannot exclude pneumonia.  Original  Report Authenticated By: Juline Patch, M.D.      Recent Labs: Lab Results  Component Value Date   WBC 7.8 05/26/2012   HGB 10.3* 05/26/2012   HCT 31.3* 05/26/2012   PLT 631.0* 05/26/2012   GLUCOSE 97 05/26/2012   CHOL 197 11/25/2011   TRIG 111.0 11/25/2011   HDL 42.20 11/25/2011   LDLCALC 133* 11/25/2011   ALT 18 05/10/2012   AST 27 05/10/2012   NA 140 05/26/2012   K 4.0 05/26/2012   CL 103 05/26/2012   CREATININE 0.9 05/26/2012   BUN 12 05/26/2012   CO2 28 05/26/2012   INR 1.54* 05/07/2012   HGBA1C 6.1* 05/05/2012      Assessment / Plan:     Steady post op improvement after valve spearing aortic root replacemnt and Cabg x1 to RCA.  Patient no ready to return to heavy lifing at work for 3 months Will see back with repeat chest xray in 3-4 weeks, no clinical evidence of pneumonia, some left pleural effusion Starting cardiac rehab next week       Delight Ovens MD 06/03/2012 3:59 PM

## 2012-06-03 NOTE — Progress Notes (Signed)
Cardiac Rehab Medication Review by a Pharmacist  Does the patient  feel that his/her medications are working for him/her?  yes  Has the patient been experiencing any side effects to the medications prescribed?  No. Described depressive symptoms.  Does the patient measure his/her own blood pressure or blood glucose at home?  Yes, both, every morning. Sometimes at night as well.   Does the patient have any problems obtaining medications due to transportation or finances?   Yes, financial sometimes.   Understanding of regimen: excellent Understanding of indications: excellent Potential of compliance: good    Pharmacist comments: Pt's wife was with him today. She knew the regimen and problems very well, seems to be the one more aware of how he should be taking his medications.     Doris Cheadle, PharmD Clinical Pharmacist Pager: [phone] Phone: [phone] 06/03/2012 8:49 AM

## 2012-06-07 ENCOUNTER — Encounter (HOSPITAL_COMMUNITY)
Admission: RE | Admit: 2012-06-07 | Discharge: 2012-06-07 | Disposition: A | Discharge status: Home / Self Care | Payer: BC Managed Care – PPO | Source: Ambulatory Visit | Attending: Cardiology | Admitting: Cardiology

## 2012-06-07 LAB — GLUCOSE, CAPILLARY
Glucose-Capillary: 147 mg/dL — ABNORMAL HIGH (ref 70–99)
Glucose-Capillary: 107 mg/dL — ABNORMAL HIGH (ref 70–99)

## 2012-06-07 NOTE — Progress Notes (Signed)
Pt started cardiac rehab today.  Pt tolerated light exercise without difficulty. Monitor showed sr with sl st depression and rare PAC.  Pt seen in the office by the surgeon last Thursday.  Pt with no medication change and remarks that he no longer has the nausea since the amiodarone was decreased to 200 mg.  Pt provided encouragement to continue progressing his activity with walking at home.  Pt had been reluctant to do so but feels more confident about walking at home since he did so well today in rehab.

## 2012-06-09 ENCOUNTER — Encounter (HOSPITAL_COMMUNITY): Admission: RE | Admit: 2012-06-09 | Payer: BC Managed Care – PPO | Source: Ambulatory Visit

## 2012-06-09 ENCOUNTER — Telehealth (HOSPITAL_COMMUNITY): Payer: Self-pay | Admitting: Cardiac Rehabilitation

## 2012-06-09 NOTE — Telephone Encounter (Signed)
Pt wife reports pt has been weak and tired X2 days with nausea, vomiting and abdominal cramping.  No BM in several days.  Pt not able to take meds x 2 days.  Pt will not be attending cardiac rehab today.   Advised to contact PCP for evaluation.  Understanding verbalized

## 2012-06-11 ENCOUNTER — Encounter (HOSPITAL_COMMUNITY): Payer: BC Managed Care – PPO

## 2012-06-11 ENCOUNTER — Other Ambulatory Visit: Payer: Self-pay | Admitting: Family Medicine

## 2012-06-14 ENCOUNTER — Encounter (HOSPITAL_COMMUNITY)
Admission: RE | Admit: 2012-06-14 | Discharge: 2012-06-14 | Disposition: A | Discharge status: Home / Self Care | Payer: BC Managed Care – PPO | Source: Ambulatory Visit | Attending: Cardiology | Admitting: Cardiology

## 2012-06-14 LAB — GLUCOSE, CAPILLARY
Glucose-Capillary: 140 mg/dL — ABNORMAL HIGH (ref 70–99)
Glucose-Capillary: 134 mg/dL — ABNORMAL HIGH (ref 70–99)

## 2012-06-16 ENCOUNTER — Encounter (HOSPITAL_COMMUNITY)
Admission: RE | Admit: 2012-06-16 | Discharge: 2012-06-16 | Disposition: A | Discharge status: Home / Self Care | Payer: BC Managed Care – PPO | Source: Ambulatory Visit | Attending: Cardiology | Admitting: Cardiology

## 2012-06-16 LAB — GLUCOSE, CAPILLARY
Glucose-Capillary: 117 mg/dL — ABNORMAL HIGH (ref 70–99)
Glucose-Capillary: 98 mg/dL (ref 70–99)

## 2012-06-18 ENCOUNTER — Encounter (HOSPITAL_COMMUNITY)
Admission: RE | Admit: 2012-06-18 | Discharge: 2012-06-18 | Disposition: A | Discharge status: Home / Self Care | Payer: BC Managed Care – PPO | Source: Ambulatory Visit | Attending: Cardiology | Admitting: Cardiology

## 2012-06-18 LAB — GLUCOSE, CAPILLARY: Glucose-Capillary: 122 mg/dL — ABNORMAL HIGH (ref 70–99)

## 2012-06-21 ENCOUNTER — Ambulatory Visit (INDEPENDENT_AMBULATORY_CARE_PROVIDER_SITE_OTHER): Payer: BC Managed Care – PPO | Admitting: Cardiology

## 2012-06-21 ENCOUNTER — Encounter: Payer: Self-pay | Admitting: Cardiology

## 2012-06-21 ENCOUNTER — Encounter (HOSPITAL_COMMUNITY)
Admission: RE | Admit: 2012-06-21 | Discharge: 2012-06-21 | Disposition: A | Discharge status: Home / Self Care | Payer: BC Managed Care – PPO | Source: Ambulatory Visit | Attending: Cardiology | Admitting: Cardiology

## 2012-06-21 VITALS — BP 130/78 | HR 85 | Ht 76.0 in | Wt 273.4 lb

## 2012-06-21 DIAGNOSIS — I2510 Atherosclerotic heart disease of native coronary artery without angina pectoris: Secondary | ICD-10-CM

## 2012-06-21 DIAGNOSIS — I77810 Thoracic aortic ectasia: Secondary | ICD-10-CM

## 2012-06-21 LAB — GLUCOSE, CAPILLARY: Glucose-Capillary: 127 mg/dL — ABNORMAL HIGH (ref 70–99)

## 2012-06-21 MED ORDER — METHOCARBAMOL 500 MG PO TABS: ORAL_TABLET | ORAL | Status: DC

## 2012-06-21 NOTE — Progress Notes (Signed)
Reviewed home exercise with pt today.  Pt plans to walk at home for exercise.  Reviewed THR, pulse, RPE, sign and symptoms, and when to call 911 or MD.  Pt voiced understanding. Jessica Hawkins, MA, ACSM RCEP   

## 2012-06-21 NOTE — Progress Notes (Signed)
PCP: Dr. Janace Hoard  51 yo with history of aortic root aneurysm and CAD returns for followup.  He underwent aortic valve sparing aortic root replacement with CABG x1 (SVG-RCA) on 05/07/12 with Dr. Tyrone Sage. He was in the hospital from 7/12-7/19. He was placed on prophylactic amiodarone and low-dose beta blocker.  I think he may have had brief peri-operative atrial fibrillation but this is difficult to tell from the notes.  He had blood loss anemia but did not require transfusion.  He was actually kept in the hospital longer than planned due to fever and elevated white count and a chest x-ray demonstrating left lower lobe opacity thought to be atelectasis versus pneumonia. He was treated with Avelox. Amiodarone dose was reduced due to complaints of nausea.  At last appointment with PA Alben Spittle, he complained of headaches (posterior, from neck up).  He was started on a muscle relaxer which has helped the headaches considerably.   Jonathan Miles has been in cardiac rehab for 2 wks now.  He continues to have periodic nausea; this is improved since amiodarone was decreased but still present.  Weight is down again today, likely due ot poor appetite with nausea.  He denies exertional dyspnea but tires easily.  Chest is mildly sore.  No tachypalpitations.  He is in NSR today.   Labs (3/12): K 4, creatinine 1.03, LDL 113, HDL 33  Labs (4/12): K 3.7, creatinine 0.8  Labs (1/13): K 3.6, creatinine 0.9, LDL 133, HDL 42  Labs (7/13):  K 3.9, creatinine 0.91, Hgb 8.9   Wt Readings from Last 3 Encounters:  06/21/12 273 lb 6.4 oz (124.013 kg)  06/03/12 285 lb (129.275 kg)  06/03/12 286 lb 13.1 oz (130.1 kg)     Past Medical History: 1. DM  2. Hyperlipidemia  3. HTN  4. Aortic root/ascending aortic aneurysm: 4/12 Echo with 5.4 cm aortic root; 4/12 MRA chest with 5.5 cm aortic root, 3.8 cm ascending thoracic aorta; 10/12 CTA chest with 6.1 x 5.1 cm aortic root/proximal ascending aorta, 4.2 x 4.1 cm mid ascending  aorta; 1/13 Echo with 5.4 cm root, 5.5 cm proximal ascending thoracic aorta. S/p valve-sparing aortic root and ascending aorta replacement with CABG x 1 (SVG-RCA) 04/2012 with Dr. Tyrone Sage. 5. Echo (1/13): EF 55%, mild LVH, 5.4 cm aortic root, 5.5 cm proximal ascending thoracic aorta. Trileaflet aortic valve with trivial AI.  6. Possible contrast allergy  7. CAD: Myoview (4/12): EF 67%, no ischemia or infarction.  LHC (1/13) with long 50% D2 stenosis, 60% proximal RCA, and 50% mid RCA. S/p CABG x 1 with SVG0-RCA at time of ascending aortic aneurysm repair. 8. ? Brief peri-operative atrial fibrillation in 7/13.    Current Outpatient Prescriptions  Medication Sig Dispense Refill  . acetaminophen (TYLENOL) 500 MG tablet TAKE 1-2 TABS EVERY 6 HOURS AS NEEDED FOR PAIN      . aspirin 325 MG tablet Take 325 mg by mouth daily.      . felodipine (PLENDIL) 10 MG 24 hr tablet Take 10 mg by mouth every evening.       . metFORMIN (GLUCOPHAGE) 500 MG tablet TAKE ONE TABLET BY MOUTH TWICE DAILY  60 tablet  2  . methocarbamol (ROBAXIN) 500 MG tablet TAKE 1-2 TABLETS AT BEDTIME AS NEEDED FOR MUSCLE SPASMS  30 tablet  0  . metoprolol tartrate (LOPRESSOR) 25 MG tablet Take 0.5 tablets (12.5 mg total) by mouth 2 (two) times daily.  30 tablet  1  . atorvastatin (LIPITOR) 20  MG tablet Take 1 tablet (20 mg total) by mouth daily at 6 PM.  30 tablet  1   No current facility-administered medications for this visit.   Facility-Administered Medications Ordered in Other Visits  Medication Dose Route Frequency Provider Last Rate Last Dose  . 0.9 %  sodium chloride infusion  250 mL Intravenous PRN Laurey Morale, MD      . sodium chloride 0.9 % injection 3 mL  3 mL Intravenous Q12H Laurey Morale, MD      . sodium chloride 0.9 % injection 3 mL  3 mL Intravenous PRN Laurey Morale, MD        Allergies: Allergies  Allergen Reactions  . Iodinated Diagnostic Agents Hives    Hives started 4 days after cta chest was  performed, minimal relief w/ benadryl x 3 days, uncertain if reaction was actually iv contrast or other allergen,allergy tests to follow.wife will make [place] aware of results//a.[email or web address]    History  Substance Use Topics  . Smoking status: Former Smoker -- 1.0 packs/day for 13 years    Types: Cigarettes    Quit date: 01/23/1988  . Smokeless tobacco: Never Used  . Alcohol Use: No     ROS:  All systems reviewed and negative except as per HPI.   PHYSICAL EXAM: VS:  BP 130/78  Pulse 85  Ht 6\' 4"  (1.93 m)  Wt 273 lb 6.4 oz (124.013 kg)  BMI 33.28 kg/m2 Well nourished, well developed, in no acute distress HEENT: normal Neck: no JVD Cardiac:  normal S1, S2; RRR; no murmur Chest: Median sternotomy scar well-healed without erythema or discharge Lungs:  clear to auscultation bilaterally, no wheezing, rhonchi or rales Abd: soft, nontender, no hepatomegaly Ext: no edema Skin: warm and dry  EKG:  Sinus rhythm, nonspecific ST-T wave changes

## 2012-06-21 NOTE — Patient Instructions (Addendum)
Stop amiodarone.   Your physician recommends that you schedule a follow-up appointment in: 1 month with Dr Shirlee Latch.  Your physician recommends that you return for a FASTING lipid profile /liver profile in 1 month when you see Dr Shirlee Latch.

## 2012-06-21 NOTE — Progress Notes (Signed)
Pt seen in the office by Dr. Shirlee Latch.  Ammiodorone  D/c'd.  Monitor for cessation of nausea.  Medication list reconciled.

## 2012-06-21 NOTE — Assessment & Plan Note (Signed)
Moderate RCA stenosis on cath, now s/p SVG-RCA with recent aortic root/ascending aorta aneurysm replacement.  Continue ASA, statin.  Will check lipids at next appointment with goal LDL < 70.

## 2012-06-21 NOTE — Assessment & Plan Note (Signed)
Status post aortic root/ascending aorta valve-sparing replacement.  He is slowly recovering.  He is in cardiac rehab.  Continue rehab.  He has had significant nausea so I will have him stop amiodarone today as this medication may be related.  He has had no documented atrial fibrillation since leaving the hospital.  He will need CT angiograms or MR angiograms of chest at regular intervals to follow his aortic repair.  This will likely be arranged through Dr. Dennie Maizes office.

## 2012-06-23 ENCOUNTER — Encounter (HOSPITAL_COMMUNITY)
Admission: RE | Admit: 2012-06-23 | Discharge: 2012-06-23 | Disposition: A | Discharge status: Home / Self Care | Payer: BC Managed Care – PPO | Source: Ambulatory Visit | Attending: Cardiology | Admitting: Cardiology

## 2012-06-23 LAB — GLUCOSE, CAPILLARY: Glucose-Capillary: 123 mg/dL — ABNORMAL HIGH (ref 70–99)

## 2012-06-25 ENCOUNTER — Encounter (HOSPITAL_COMMUNITY)
Admission: RE | Admit: 2012-06-25 | Discharge: 2012-06-25 | Disposition: A | Discharge status: Home / Self Care | Payer: BC Managed Care – PPO | Source: Ambulatory Visit | Attending: Cardiology | Admitting: Cardiology

## 2012-06-30 ENCOUNTER — Encounter (HOSPITAL_COMMUNITY)
Admission: RE | Admit: 2012-06-30 | Discharge: 2012-06-30 | Disposition: A | Discharge status: Home / Self Care | Payer: BC Managed Care – PPO | Source: Ambulatory Visit | Attending: Cardiology | Admitting: Cardiology

## 2012-06-30 DIAGNOSIS — Z5189 Encounter for other specified aftercare: Secondary | ICD-10-CM | POA: Insufficient documentation

## 2012-06-30 DIAGNOSIS — I2510 Atherosclerotic heart disease of native coronary artery without angina pectoris: Secondary | ICD-10-CM | POA: Insufficient documentation

## 2012-06-30 DIAGNOSIS — I10 Essential (primary) hypertension: Secondary | ICD-10-CM | POA: Insufficient documentation

## 2012-06-30 DIAGNOSIS — I359 Nonrheumatic aortic valve disorder, unspecified: Secondary | ICD-10-CM | POA: Insufficient documentation

## 2012-06-30 LAB — GLUCOSE, CAPILLARY: Glucose-Capillary: 127 mg/dL — ABNORMAL HIGH (ref 70–99)

## 2012-07-01 ENCOUNTER — Ambulatory Visit
Admission: RE | Admit: 2012-07-01 | Discharge: 2012-07-01 | Disposition: A | Discharge status: Home / Self Care | Payer: BC Managed Care – PPO | Source: Ambulatory Visit | Attending: Cardiothoracic Surgery | Admitting: Cardiothoracic Surgery

## 2012-07-01 ENCOUNTER — Other Ambulatory Visit: Payer: Self-pay | Admitting: Cardiothoracic Surgery

## 2012-07-01 ENCOUNTER — Encounter: Payer: Self-pay | Admitting: Cardiothoracic Surgery

## 2012-07-01 ENCOUNTER — Ambulatory Visit (INDEPENDENT_AMBULATORY_CARE_PROVIDER_SITE_OTHER): Payer: Self-pay | Admitting: Cardiothoracic Surgery

## 2012-07-01 VITALS — BP 123/81 | HR 78 | Resp 18 | Ht 76.0 in | Wt 287.0 lb

## 2012-07-01 DIAGNOSIS — J90 Pleural effusion, not elsewhere classified: Secondary | ICD-10-CM

## 2012-07-01 DIAGNOSIS — I2510 Atherosclerotic heart disease of native coronary artery without angina pectoris: Secondary | ICD-10-CM

## 2012-07-01 DIAGNOSIS — Z09 Encounter for follow-up examination after completed treatment for conditions other than malignant neoplasm: Secondary | ICD-10-CM

## 2012-07-01 DIAGNOSIS — Z951 Presence of aortocoronary bypass graft: Secondary | ICD-10-CM

## 2012-07-01 DIAGNOSIS — I77810 Thoracic aortic ectasia: Secondary | ICD-10-CM

## 2012-07-01 DIAGNOSIS — Z9889 Other specified postprocedural states: Secondary | ICD-10-CM

## 2012-07-01 NOTE — Progress Notes (Signed)
[address]            [place] [address]          [phone]        HURSHEL BOUILLON [facility] Medical Record #[id number] Date of Birth: [birth year]  Dr  Algis Downs Anise Salvo, MD  Chief Complaint:   PostOp Follow Up Visit 05/07/2012  DATE OF DISCHARGE:  OPERATIVE REPORT  PREOPERATIVE DIAGNOSIS: Dilated aortic root.  POSTOPERATIVE DIAGNOSIS: Dilated aortic root.  SURGICAL PROCEDURE: Valve-sparing aortic root replacement and coronary  artery bypass grafting x1 with second reverse saphenous vein graft to  the distal right coronary artery with right thigh endo vein harvesting.    History of Present Illness:     Feels better with reduced dose of cordarone. Had nausea now off pacerone  Has completely resolved . No symptoms of heart failure. No fever or chills. Doing well in cardiac rehab  History  Smoking status  . Former Smoker -- 1.0 packs/day for 13 years  . Types: Cigarettes  . Quit date: 01/23/1988  Smokeless tobacco  . Never Used       Allergies  Allergen Reactions  . Iodinated Diagnostic Agents Hives    Hives started 4 days after cta chest was performed, minimal relief w/ benadryl x 3 days, uncertain if reaction was actually iv contrast or other allergen,allergy tests to follow.wife will make [place] aware of results//a.[email or web address]    Current Outpatient Prescriptions  Medication Sig Dispense Refill  . acetaminophen (TYLENOL) 500 MG tablet TAKE 1-2 TABS EVERY 6 HOURS AS NEEDED FOR PAIN      . aspirin 325 MG tablet Take 325 mg by mouth daily.      Marland Kitchen atorvastatin (LIPITOR) 20 MG tablet Take 1 tablet (20 mg total) by mouth daily at 6 PM.  30 tablet  1  . felodipine (PLENDIL) 10 MG 24 hr tablet Take 10 mg by mouth every evening.       . metFORMIN (GLUCOPHAGE) 500 MG tablet TAKE ONE TABLET BY MOUTH TWICE DAILY  60 tablet  2  . methocarbamol (ROBAXIN) 500 MG tablet TAKE 1-2 TABLETS AT BEDTIME AS NEEDED FOR MUSCLE SPASMS   30 tablet  0  . metoprolol tartrate (LOPRESSOR) 25 MG tablet Take 0.5 tablets (12.5 mg total) by mouth 2 (two) times daily.  30 tablet  1   No current facility-administered medications for this visit.   Facility-Administered Medications Ordered in Other Visits  Medication Dose Route Frequency Provider Last Rate Last Dose  . 0.9 %  sodium chloride infusion  250 mL Intravenous PRN Laurey Morale, MD      . sodium chloride 0.9 % injection 3 mL  3 mL Intravenous Q12H Laurey Morale, MD      . sodium chloride 0.9 % injection 3 mL  3 mL Intravenous PRN Laurey Morale, MD           Physical Exam: BP 123/81  Pulse 78  Resp 18  Ht 6\' 4"  (1.93 m)  Wt 287 lb (130.182 kg)  BMI 34.93 kg/m2  SpO2 97%  General appearance: alert and cooperative Neurologic: intact Heart: regular rate and rhythm, S1, S2 normal, no murmur, click, rub or gallop, normal apical impulse, no rub and no murmur of AI Lungs: clear to auscultation bilaterally  and normal percussion bilaterally Abdomen: soft, non-tender; bowel sounds normal; no masses,  no organomegaly Extremities: extremities normal, atraumatic, no cyanosis or edema and Homans sign is negative, no sign of DVT Wound: sternum stable and wound well healed  Diagnostic Studies & Laboratory data:         Recent Radiology Findings: Dg Chest 2 View  07/01/2012  *RADIOLOGY REPORT*  Clinical Data: Follow up CABG  CHEST - 2 VIEW  Comparison: 06/03/2012  Findings: Lungs are essentially clear.  No pleural effusion or pneumothorax.  Borderline cardiomegaly, unchanged. Postsurgical changes related to prior CABG.  Degenerative changes of the visualized thoracolumbar spine.  Mild compression deformity of a lower thoracic vertebral body, unchanged.  IMPRESSION: No evidence of acute cardiopulmonary disease.   Original Report Authenticated By: Charline Bills, M.D.       Recent Labs: Lab Results  Component Value Date   WBC 7.8 05/26/2012   HGB 10.3* 05/26/2012   HCT  31.3* 05/26/2012   PLT 631.0* 05/26/2012   GLUCOSE 97 05/26/2012   CHOL 197 11/25/2011   TRIG 111.0 11/25/2011   HDL 42.20 11/25/2011   LDLCALC 133* 11/25/2011   ALT 18 05/10/2012   AST 27 05/10/2012   NA 140 05/26/2012   K 4.0 05/26/2012   CL 103 05/26/2012   CREATININE 0.9 05/26/2012   BUN 12 05/26/2012   CO2 28 05/26/2012   INR 1.54* 05/07/2012   HGBA1C 6.1* 05/05/2012      Assessment / Plan:     Steady post op improvement after valve spearing aortic root replacemnt and Cabg x1 to RCA.  Patient no ready to return to heavy lifing at work for 3 months Will see back with repeat chest xray in 3 months, no clinical evidence of pneumonia,  left pleural effusion resolved Started cardiac rehab now and doing well May start driving  Will return to work at end of September    Delight Ovens MD 07/01/2012 4:28 PM

## 2012-07-01 NOTE — Patient Instructions (Signed)
May drive May return to work, September 30 See me in 3 months

## 2012-07-02 ENCOUNTER — Encounter (HOSPITAL_COMMUNITY)
Admission: RE | Admit: 2012-07-02 | Discharge: 2012-07-02 | Disposition: A | Discharge status: Home / Self Care | Payer: BC Managed Care – PPO | Source: Ambulatory Visit | Attending: Cardiology | Admitting: Cardiology

## 2012-07-05 ENCOUNTER — Encounter (HOSPITAL_COMMUNITY)
Admission: RE | Admit: 2012-07-05 | Discharge: 2012-07-05 | Disposition: A | Discharge status: Home / Self Care | Payer: BC Managed Care – PPO | Source: Ambulatory Visit | Attending: Cardiology | Admitting: Cardiology

## 2012-07-05 LAB — GLUCOSE, CAPILLARY: Glucose-Capillary: 165 mg/dL — ABNORMAL HIGH (ref 70–99)

## 2012-07-05 NOTE — Progress Notes (Signed)
Pt will return to work on 9/30.  With his work schedule he will not be able to continue to participate in Cardiac Rehab. Pt will graduate on 9/27.

## 2012-07-07 ENCOUNTER — Encounter (HOSPITAL_COMMUNITY)
Admission: RE | Admit: 2012-07-07 | Discharge: 2012-07-07 | Disposition: A | Discharge status: Home / Self Care | Payer: BC Managed Care – PPO | Source: Ambulatory Visit | Attending: Cardiology | Admitting: Cardiology

## 2012-07-09 ENCOUNTER — Encounter (HOSPITAL_COMMUNITY)
Admission: RE | Admit: 2012-07-09 | Discharge: 2012-07-09 | Disposition: A | Discharge status: Home / Self Care | Payer: BC Managed Care – PPO | Source: Ambulatory Visit | Attending: Cardiology | Admitting: Cardiology

## 2012-07-12 ENCOUNTER — Encounter (HOSPITAL_COMMUNITY)
Admission: RE | Admit: 2012-07-12 | Discharge: 2012-07-12 | Disposition: A | Discharge status: Home / Self Care | Payer: BC Managed Care – PPO | Source: Ambulatory Visit | Attending: Cardiology | Admitting: Cardiology

## 2012-07-12 LAB — GLUCOSE, CAPILLARY: Glucose-Capillary: 101 mg/dL — ABNORMAL HIGH (ref 70–99)

## 2012-07-14 ENCOUNTER — Encounter (HOSPITAL_COMMUNITY)
Admission: RE | Admit: 2012-07-14 | Discharge: 2012-07-14 | Disposition: A | Discharge status: Home / Self Care | Payer: BC Managed Care – PPO | Source: Ambulatory Visit | Attending: Cardiology | Admitting: Cardiology

## 2012-07-14 LAB — GLUCOSE, CAPILLARY: Glucose-Capillary: 150 mg/dL — ABNORMAL HIGH (ref 70–99)

## 2012-07-16 ENCOUNTER — Encounter (HOSPITAL_COMMUNITY)
Admission: RE | Admit: 2012-07-16 | Discharge: 2012-07-16 | Disposition: A | Discharge status: Home / Self Care | Payer: BC Managed Care – PPO | Source: Ambulatory Visit | Attending: Cardiology | Admitting: Cardiology

## 2012-07-19 ENCOUNTER — Encounter (HOSPITAL_COMMUNITY)
Admission: RE | Admit: 2012-07-19 | Discharge: 2012-07-19 | Disposition: A | Discharge status: Home / Self Care | Payer: BC Managed Care – PPO | Source: Ambulatory Visit | Attending: Cardiology | Admitting: Cardiology

## 2012-07-21 ENCOUNTER — Encounter (HOSPITAL_COMMUNITY)
Admission: RE | Admit: 2012-07-21 | Discharge: 2012-07-21 | Disposition: A | Discharge status: Home / Self Care | Payer: BC Managed Care – PPO | Source: Ambulatory Visit | Attending: Cardiology | Admitting: Cardiology

## 2012-07-21 ENCOUNTER — Other Ambulatory Visit: Payer: Self-pay | Admitting: Cardiology

## 2012-07-21 MED ORDER — METOPROLOL TARTRATE 25 MG PO TABS: 12.5000 mg | ORAL_TABLET | Freq: Two times a day (BID) | ORAL | Status: DC

## 2012-07-21 MED ORDER — ATORVASTATIN CALCIUM 20 MG PO TABS: 20.0000 mg | ORAL_TABLET | Freq: Every day | ORAL | Status: DC

## 2012-07-23 ENCOUNTER — Encounter (HOSPITAL_COMMUNITY)
Admission: RE | Admit: 2012-07-23 | Discharge: 2012-07-23 | Disposition: A | Discharge status: Home / Self Care | Payer: BC Managed Care – PPO | Source: Ambulatory Visit | Attending: Cardiology | Admitting: Cardiology

## 2012-07-23 LAB — GLUCOSE, CAPILLARY: Glucose-Capillary: 96 mg/dL (ref 70–99)

## 2012-07-26 ENCOUNTER — Encounter (HOSPITAL_COMMUNITY): Payer: BC Managed Care – PPO

## 2012-07-27 ENCOUNTER — Ambulatory Visit (INDEPENDENT_AMBULATORY_CARE_PROVIDER_SITE_OTHER): Payer: BC Managed Care – PPO | Admitting: Cardiology

## 2012-07-27 ENCOUNTER — Encounter: Payer: Self-pay | Admitting: Cardiology

## 2012-07-27 ENCOUNTER — Other Ambulatory Visit (INDEPENDENT_AMBULATORY_CARE_PROVIDER_SITE_OTHER): Payer: BC Managed Care – PPO

## 2012-07-27 ENCOUNTER — Other Ambulatory Visit: Payer: Self-pay | Admitting: *Deleted

## 2012-07-27 VITALS — BP 142/80 | HR 80 | Ht 76.0 in | Wt 298.5 lb

## 2012-07-27 DIAGNOSIS — E876 Hypokalemia: Secondary | ICD-10-CM

## 2012-07-27 DIAGNOSIS — I2510 Atherosclerotic heart disease of native coronary artery without angina pectoris: Secondary | ICD-10-CM

## 2012-07-27 DIAGNOSIS — I77810 Thoracic aortic ectasia: Secondary | ICD-10-CM

## 2012-07-27 DIAGNOSIS — I10 Essential (primary) hypertension: Secondary | ICD-10-CM

## 2012-07-27 DIAGNOSIS — E785 Hyperlipidemia, unspecified: Secondary | ICD-10-CM

## 2012-07-27 DIAGNOSIS — I719 Aortic aneurysm of unspecified site, without rupture: Secondary | ICD-10-CM

## 2012-07-27 DIAGNOSIS — R0989 Other specified symptoms and signs involving the circulatory and respiratory systems: Secondary | ICD-10-CM

## 2012-07-27 LAB — BASIC METABOLIC PANEL
BUN: 15 mg/dL (ref 6–23)
CO2: 27 mEq/L (ref 19–32)
Calcium: 8.9 mg/dL (ref 8.4–10.5)
Chloride: 104 mEq/L (ref 96–112)
Creatinine, Ser: 0.9 mg/dL (ref 0.4–1.5)
GFR: 120.17 mL/min (ref >60.00)
Glucose, Bld: 97 mg/dL (ref 70–99)
Potassium: 3.2 mEq/L — ABNORMAL LOW (ref 3.5–5.1)
Sodium: 139 mEq/L (ref 135–145)

## 2012-07-27 LAB — LIPID PANEL
Cholesterol: 153 mg/dL (ref 0–200)
HDL: 46.9 mg/dL (ref >39.00)
LDL Cholesterol: 94 mg/dL (ref 0–99)
Total CHOL/HDL Ratio: 3
Triglycerides: 63 mg/dL (ref 0.0–149.0)
VLDL: 12.6 mg/dL (ref 0.0–40.0)

## 2012-07-27 LAB — HEPATIC FUNCTION PANEL
ALT: 14 U/L (ref 0–53)
AST: 13 U/L (ref 0–37)
Albumin: 3.7 g/dL (ref 3.5–5.2)
Alkaline Phosphatase: 66 U/L (ref 39–117)
Bilirubin, Direct: 0 mg/dL (ref 0.0–0.3)
Total Bilirubin: 0.4 mg/dL (ref 0.3–1.2)
Total Protein: 7 g/dL (ref 6.0–8.3)

## 2012-07-27 MED ORDER — ATORVASTATIN CALCIUM 40 MG PO TABS: 40.0000 mg | ORAL_TABLET | Freq: Every day | ORAL | Status: DC

## 2012-07-27 MED ORDER — POTASSIUM CHLORIDE CRYS ER 20 MEQ PO TBCR: 20.0000 meq | EXTENDED_RELEASE_TABLET | Freq: Every day | ORAL | Status: DC

## 2012-07-27 MED ORDER — CARVEDILOL 6.25 MG PO TABS: 6.2500 mg | ORAL_TABLET | Freq: Two times a day (BID) | ORAL | Status: DC

## 2012-07-27 NOTE — Progress Notes (Signed)
Patient ID: Jonathan Miles, male   DOB: 1960-12-24, 51 y.o.   MRN: [id number] PCP: Dr. Janace Miles  51 yo with history of aortic root aneurysm and CAD returns for followup.  He underwent aortic valve sparing aortic root replacement with CABG x1 (SVG-RCA) on 05/07/12 with Dr. Tyrone Miles. He was in the hospital from 7/12-7/19. He was placed on prophylactic amiodarone and low-dose beta blocker.  I think he may have had brief peri-operative atrial fibrillation but this is difficult to tell from the notes.  At last appointment, he had significant nausea.  I stopped his amiodarone and this has completely resolved.  His appetite is much better and weight has gone up. He is in NSR today and has not noted palpitations.   Jonathan Miles completed cardiac rehab last week.  He is back at work doing light duties.  He is doing well with no exertional dyspnea or chest pain.  BP runs high at times, it is 142/80 today.    Labs (3/12): K 4, creatinine 1.03, LDL 113, HDL 33  Labs (4/12): K 3.7, creatinine 0.8  Labs (1/13): K 3.6, creatinine 0.9, LDL 133, HDL 42  Labs (7/13):  K 3.9, creatinine 0.91, Hgb 8.9   Wt Readings from Last 3 Encounters:  07/27/12 298 lb 8 oz (135.399 kg)  07/01/12 287 lb (130.182 kg)  06/21/12 273 lb 6.4 oz (124.013 kg)     Past Medical History: 1. DM  2. Hyperlipidemia  3. HTN  4. Aortic root/ascending aortic aneurysm: 4/12 Echo with 5.4 cm aortic root; 4/12 MRA chest with 5.5 cm aortic root, 3.8 cm ascending thoracic aorta; 10/12 CTA chest with 6.1 x 5.1 cm aortic root/proximal ascending aorta, 4.2 x 4.1 cm mid ascending aorta; 1/13 Echo with 5.4 cm root, 5.5 cm proximal ascending thoracic aorta. S/p valve-sparing aortic root and ascending aorta replacement with CABG x 1 (SVG-RCA) 04/2012 with Dr. Tyrone Miles. 5. Echo (1/13): EF 55%, mild LVH, 5.4 cm aortic root, 5.5 cm proximal ascending thoracic aorta. Trileaflet aortic valve with trivial AI.  6. Possible contrast allergy  7. CAD: Myoview  (4/12): EF 67%, no ischemia or infarction.  LHC (1/13) with long 50% D2 stenosis, 60% proximal RCA, and 50% mid RCA. S/p CABG x 1 with SVG0-RCA at time of ascending aortic aneurysm repair. 8. ? Brief peri-operative atrial fibrillation in 7/13. Nausea with amiodarone.    Current Outpatient Prescriptions  Medication Sig Dispense Refill  . acetaminophen (TYLENOL) 500 MG tablet TAKE 1-2 TABS EVERY 6 HOURS AS NEEDED FOR PAIN      . aspirin 325 MG tablet Take 325 mg by mouth daily.      . felodipine (PLENDIL) 10 MG 24 hr tablet Take 10 mg by mouth every evening.       . metFORMIN (GLUCOPHAGE) 500 MG tablet TAKE ONE TABLET BY MOUTH TWICE DAILY  60 tablet  2  . methocarbamol (ROBAXIN) 500 MG tablet TAKE 1-2 TABLETS AT BEDTIME AS NEEDED FOR MUSCLE SPASMS  30 tablet  0  . DISCONTD: atorvastatin (LIPITOR) 20 MG tablet Take 1 tablet (20 mg total) by mouth daily at 6 PM.  30 tablet  1  . atorvastatin (LIPITOR) 40 MG tablet Take 1 tablet (40 mg total) by mouth daily.  30 tablet  3  . carvedilol (COREG) 6.25 MG tablet Take 1 tablet (6.25 mg total) by mouth 2 (two) times daily.  60 tablet  11  . potassium chloride SA (K-DUR,KLOR-CON) 20 MEQ tablet Take 1 tablet (20  mEq total) by mouth daily.  30 tablet  6   No current facility-administered medications for this visit.   Facility-Administered Medications Ordered in Other Visits  Medication Dose Route Frequency Provider Last Rate Last Dose  . DISCONTD: 0.9 %  sodium chloride infusion  250 mL Intravenous PRN Jonathan Morale, MD      . DISCONTD: sodium chloride 0.9 % injection 3 mL  3 mL Intravenous Q12H Jonathan Morale, MD      . DISCONTD: sodium chloride 0.9 % injection 3 mL  3 mL Intravenous PRN Jonathan Morale, MD        Allergies: Allergies  Allergen Reactions  . Iodinated Diagnostic Agents Hives    Hives started 4 days after cta chest was performed, minimal relief w/ benadryl x 3 days, uncertain if reaction was actually iv contrast or other  allergen,allergy tests to follow.wife will make [place] aware of results//Jonathan Miles    History  Substance Use Topics  . Smoking status: Former Smoker -- 1.0 packs/day for 13 years    Types: Cigarettes    Quit date: 01/23/1988  . Smokeless tobacco: Never Used  . Alcohol Use: No     ROS:  All systems reviewed and negative except as per HPI.   PHYSICAL EXAM: VS:  BP 142/80  Pulse 80  Ht 6\' 4"  (1.93 m)  Wt 298 lb 8 oz (135.399 kg)  BMI 36.33 kg/m2 Well nourished, well developed, in no acute distress HEENT: normal Neck: no JVD Cardiac:  normal S1, S2; RRR; 1/6 diastolic murmur LUSB Chest: Median sternotomy scar well-healed without erythema or discharge Lungs:  clear to auscultation bilaterally, no wheezing, rhonchi or rales Abd: soft, nontender, no hepatomegaly Ext: no edema Skin: warm and dry  Assessment/Plan: CAD  Moderate RCA stenosis on cath, now s/p SVG-RCA with recent aortic root/ascending aorta aneurysm replacement. Continue ASA, statin. Check lipids today with goal LDL < 70.  Aortic root dilatation  Status post aortic root/ascending aorta valve-sparing replacement. He is doing well and has completed cardiac rehab.  He will need CT angiograms or Jonathan angiograms of chest at regular intervals to follow his aortic repair. This will likely be arranged through Dr. Dennie Miles office.  He does have a possible diastolic murmur on exam, so I will get an echo to look for post-op aortic insufficiency.  Hypertension BP has been running high at times.  I will have him stop metoprolol and use Coreg 6.25 mg bid for better BP effect.    Jonathan Miles 07/27/2012

## 2012-07-27 NOTE — Patient Instructions (Addendum)
Stop metoprolol.   Start coreg (carvedilol) 6.25mg  two times a day.   Your physician recommends that you return for a FASTING lipid profile /liver profile /BMET today.  Your physician has requested that you have an echocardiogram. Echocardiography is a painless test that uses sound waves to create images of your heart. It provides your doctor with information about the size and shape of your heart and how well your heart's chambers and valves are working. This procedure takes approximately one hour. There are no restrictions for this procedure. In the next week or so.    Your physician wants you to follow-up in: 6 months with Dr Shirlee Latch. (April 2014). You will receive a reminder letter in the mail two months in advance. If you don't receive a letter, please call our office to schedule the follow-up appointment.;;

## 2012-07-28 ENCOUNTER — Encounter (HOSPITAL_COMMUNITY): Payer: BC Managed Care – PPO

## 2012-07-29 ENCOUNTER — Ambulatory Visit (HOSPITAL_COMMUNITY): Payer: BC Managed Care – PPO | Attending: Cardiology | Admitting: Radiology

## 2012-07-29 DIAGNOSIS — I379 Nonrheumatic pulmonary valve disorder, unspecified: Secondary | ICD-10-CM | POA: Insufficient documentation

## 2012-07-29 DIAGNOSIS — I712 Thoracic aortic aneurysm, without rupture, unspecified: Secondary | ICD-10-CM

## 2012-07-29 DIAGNOSIS — E119 Type 2 diabetes mellitus without complications: Secondary | ICD-10-CM | POA: Insufficient documentation

## 2012-07-29 DIAGNOSIS — I10 Essential (primary) hypertension: Secondary | ICD-10-CM | POA: Insufficient documentation

## 2012-07-29 DIAGNOSIS — I359 Nonrheumatic aortic valve disorder, unspecified: Secondary | ICD-10-CM | POA: Insufficient documentation

## 2012-07-29 DIAGNOSIS — I2510 Atherosclerotic heart disease of native coronary artery without angina pectoris: Secondary | ICD-10-CM

## 2012-07-29 DIAGNOSIS — I77810 Thoracic aortic ectasia: Secondary | ICD-10-CM

## 2012-07-29 NOTE — Progress Notes (Signed)
Echocardiogram performed.  

## 2012-07-30 ENCOUNTER — Telehealth: Payer: Self-pay | Admitting: Cardiology

## 2012-07-30 ENCOUNTER — Encounter (HOSPITAL_COMMUNITY): Payer: BC Managed Care – PPO

## 2012-07-30 NOTE — Telephone Encounter (Signed)
Spoke with pt who is asking what he can take for his allergies.  I told him anti-histamines were OK (claritin, allegra) but that he should avoid decongestants and anything with pseudoephedrine.

## 2012-07-30 NOTE — Telephone Encounter (Signed)
plz return call to patient at [phone] regarding questions about patient medication.

## 2012-08-02 ENCOUNTER — Telehealth: Payer: Self-pay | Admitting: Cardiology

## 2012-08-02 ENCOUNTER — Encounter (HOSPITAL_COMMUNITY): Payer: BC Managed Care – PPO

## 2012-08-02 NOTE — Telephone Encounter (Signed)
plz return call to pt [phone] regarding test results

## 2012-08-02 NOTE — Telephone Encounter (Signed)
Spoke with pt about recent echo results. 

## 2012-08-04 ENCOUNTER — Encounter (HOSPITAL_COMMUNITY): Payer: BC Managed Care – PPO

## 2012-08-06 ENCOUNTER — Encounter (HOSPITAL_COMMUNITY): Payer: BC Managed Care – PPO

## 2012-08-06 ENCOUNTER — Other Ambulatory Visit (INDEPENDENT_AMBULATORY_CARE_PROVIDER_SITE_OTHER): Payer: BC Managed Care – PPO

## 2012-08-06 DIAGNOSIS — E876 Hypokalemia: Secondary | ICD-10-CM

## 2012-08-06 LAB — BASIC METABOLIC PANEL
BUN: 16 mg/dL (ref 6–23)
CO2: 26 mEq/L (ref 19–32)
Calcium: 8.9 mg/dL (ref 8.4–10.5)
Chloride: 105 mEq/L (ref 96–112)
Creatinine, Ser: 0.8 mg/dL (ref 0.4–1.5)
GFR: 126.94 mL/min (ref >60.00)
Glucose, Bld: 104 mg/dL — ABNORMAL HIGH (ref 70–99)
Potassium: 3.8 mEq/L (ref 3.5–5.1)
Sodium: 140 mEq/L (ref 135–145)

## 2012-08-09 ENCOUNTER — Encounter (HOSPITAL_COMMUNITY): Payer: BC Managed Care – PPO

## 2012-08-11 ENCOUNTER — Encounter (HOSPITAL_COMMUNITY): Payer: BC Managed Care – PPO

## 2012-08-13 ENCOUNTER — Encounter (HOSPITAL_COMMUNITY): Payer: BC Managed Care – PPO

## 2012-08-16 ENCOUNTER — Encounter (HOSPITAL_COMMUNITY): Payer: BC Managed Care – PPO

## 2012-08-18 ENCOUNTER — Encounter (HOSPITAL_COMMUNITY): Payer: BC Managed Care – PPO

## 2012-08-20 ENCOUNTER — Encounter (HOSPITAL_COMMUNITY): Payer: BC Managed Care – PPO

## 2012-08-23 ENCOUNTER — Encounter (HOSPITAL_COMMUNITY): Payer: BC Managed Care – PPO

## 2012-08-25 ENCOUNTER — Encounter (HOSPITAL_COMMUNITY): Payer: BC Managed Care – PPO

## 2012-08-27 ENCOUNTER — Encounter (HOSPITAL_COMMUNITY): Payer: BC Managed Care – PPO

## 2012-08-30 ENCOUNTER — Encounter (HOSPITAL_COMMUNITY): Payer: BC Managed Care – PPO

## 2012-09-01 ENCOUNTER — Encounter (HOSPITAL_COMMUNITY): Payer: BC Managed Care – PPO

## 2012-09-03 ENCOUNTER — Encounter (HOSPITAL_COMMUNITY): Payer: BC Managed Care – PPO

## 2012-09-06 ENCOUNTER — Encounter (HOSPITAL_COMMUNITY): Payer: BC Managed Care – PPO

## 2012-09-08 ENCOUNTER — Encounter (HOSPITAL_COMMUNITY): Payer: BC Managed Care – PPO

## 2012-09-10 ENCOUNTER — Encounter (HOSPITAL_COMMUNITY): Payer: BC Managed Care – PPO

## 2012-09-19 ENCOUNTER — Other Ambulatory Visit: Payer: Self-pay | Admitting: Physician Assistant

## 2012-10-01 ENCOUNTER — Other Ambulatory Visit (INDEPENDENT_AMBULATORY_CARE_PROVIDER_SITE_OTHER): Payer: BC Managed Care – PPO

## 2012-10-01 DIAGNOSIS — E785 Hyperlipidemia, unspecified: Secondary | ICD-10-CM

## 2012-10-01 LAB — HEPATIC FUNCTION PANEL
ALT: 21 U/L (ref 0–53)
AST: 13 U/L (ref 0–37)
Albumin: 3.9 g/dL (ref 3.5–5.2)
Alkaline Phosphatase: 71 U/L (ref 39–117)
Bilirubin, Direct: 0.1 mg/dL (ref 0.0–0.3)
Total Bilirubin: 0.6 mg/dL (ref 0.3–1.2)
Total Protein: 7.1 g/dL (ref 6.0–8.3)

## 2012-10-01 LAB — LIPID PANEL
Cholesterol: 160 mg/dL (ref 0–200)
HDL: 48.9 mg/dL (ref >39.00)
LDL Cholesterol: 98 mg/dL (ref 0–99)
Total CHOL/HDL Ratio: 3
Triglycerides: 64 mg/dL (ref 0.0–149.0)
VLDL: 12.8 mg/dL (ref 0.0–40.0)

## 2012-10-06 ENCOUNTER — Other Ambulatory Visit: Payer: Self-pay | Admitting: *Deleted

## 2012-10-06 ENCOUNTER — Other Ambulatory Visit: Payer: Self-pay | Admitting: Family Medicine

## 2012-10-06 MED ORDER — ATORVASTATIN CALCIUM 40 MG PO TABS: 40.0000 mg | ORAL_TABLET | Freq: Every day | ORAL | Status: DC

## 2012-10-06 NOTE — Telephone Encounter (Signed)
Chart pulled to PA pool [id number]

## 2012-10-07 ENCOUNTER — Other Ambulatory Visit: Payer: Self-pay | Admitting: *Deleted

## 2012-10-07 ENCOUNTER — Encounter: Payer: Self-pay | Admitting: Cardiothoracic Surgery

## 2012-10-07 ENCOUNTER — Ambulatory Visit
Admission: RE | Admit: 2012-10-07 | Discharge: 2012-10-07 | Disposition: A | Discharge status: Home / Self Care | Payer: BC Managed Care – PPO | Source: Ambulatory Visit | Attending: Cardiothoracic Surgery | Admitting: Cardiothoracic Surgery

## 2012-10-07 ENCOUNTER — Ambulatory Visit (INDEPENDENT_AMBULATORY_CARE_PROVIDER_SITE_OTHER): Payer: BC Managed Care – PPO | Admitting: Cardiothoracic Surgery

## 2012-10-07 VITALS — BP 144/84 | HR 77 | Resp 16 | Ht 76.0 in | Wt 313.0 lb

## 2012-10-07 DIAGNOSIS — I2510 Atherosclerotic heart disease of native coronary artery without angina pectoris: Secondary | ICD-10-CM

## 2012-10-07 DIAGNOSIS — Z09 Encounter for follow-up examination after completed treatment for conditions other than malignant neoplasm: Secondary | ICD-10-CM

## 2012-10-07 DIAGNOSIS — I77810 Thoracic aortic ectasia: Secondary | ICD-10-CM

## 2012-10-07 NOTE — Progress Notes (Signed)
[address]            [place] [address]          [phone]          Jonathan Miles [facility] Medical Record #[id number] Date of Birth: Jul 07, 1961  Dr  Algis Downs Anise Salvo, MD  Chief Complaint:   PostOp Follow Up Visit 05/07/2012   OPERATIVE REPORT  PREOPERATIVE DIAGNOSIS: Dilated aortic root.  POSTOPERATIVE DIAGNOSIS: Dilated aortic root.  SURGICAL PROCEDURE: Valve-sparing aortic root replacement and coronary  artery bypass grafting x1 with second reverse saphenous vein graft to  the distal right coronary artery with right thigh endo vein harvesting.    History of Present Illness:     Feels better with reduced dose of cordarone. Had nausea now off pacerone  Has completely resolved . No symptoms of heart failure. No fever or chills. Doing well in cardiac rehab  History  Smoking status  . Former Smoker -- 1.0 packs/day for 13 years  . Types: Cigarettes  . Quit date: 01/23/1988  Smokeless tobacco  . Never Used       Allergies  Allergen Reactions  . Iodinated Diagnostic Agents Hives    Hives started 4 days after cta chest was performed, minimal relief w/ benadryl x 3 days, uncertain if reaction was actually iv contrast or other allergen,allergy tests to follow.wife will make [place] aware of results//a.calhoun    Current Outpatient Prescriptions  Medication Sig Dispense Refill  . acetaminophen (TYLENOL) 500 MG tablet TAKE 1-2 TABS EVERY 6 HOURS AS NEEDED FOR PAIN      . aspirin 325 MG tablet Take 325 mg by mouth daily.      Marland Kitchen atorvastatin (LIPITOR) 40 MG tablet Take 1 tablet (40 mg total) by mouth daily.  30 tablet  4  . carvedilol (COREG) 6.25 MG tablet Take 1 tablet (6.25 mg total) by mouth 2 (two) times daily.  60 tablet  11  . felodipine (PLENDIL) 10 MG 24 hr tablet Take 1 tablet (10 mg total) by mouth daily. Needs office visit  30 tablet  0  . metFORMIN (GLUCOPHAGE) 500 MG tablet Take 1 tablet (500 mg  total) by mouth 2 (two) times daily with a meal. Need office visit for additional refills.  60 tablet  0  . methocarbamol (ROBAXIN) 500 MG tablet TAKE 1-2 TABLETS AT BEDTIME AS NEEDED FOR MUSCLE SPASMS  30 tablet  0       Physical Exam: BP 144/84  Pulse 77  Resp 16  Ht 6\' 4"  (1.93 m)  Wt 313 lb (141.976 kg)  BMI 38.10 kg/m2  SpO2 95%  General appearance: alert and cooperative Neurologic: intact Heart: regular rate and rhythm, S1, S2 normal, no murmur, click, rub or gallop, normal apical impulse, no rub and no murmur of AI Lungs: clear to auscultation bilaterally and normal percussion bilaterally Abdomen: soft, non-tender; bowel sounds normal; no masses,  no organomegaly Extremities: extremities normal, atraumatic, no cyanosis or edema and Homans sign is negative, no sign of DVT Wound: sternum stable, has developed significant keloid still with some redness but no infection  Diagnostic Studies & Laboratory data:         Recent Radiology Findings: Dg Chest 2 View  10/07/2012  *RADIOLOGY REPORT*  Clinical Data: Chest pain.  CHEST - 2 VIEW  Comparison: 07/01/2012  Findings: Prior CABG.  Heart is normal size.  No confluent airspace opacities or effusions.  No change since prior study.  Degenerative changes in the thoracic spine.  Slight compression deformity in the lower thoracic spine, stable.  IMPRESSION: No acute cardiopulmonary disease.  No change.   Original Report Authenticated By: Charlett Nose, M.D.       Recent Labs: Lab Results  Component Value Date   WBC 7.8 05/26/2012   HGB 10.3* 05/26/2012   HCT 31.3* 05/26/2012   PLT 631.0* 05/26/2012   GLUCOSE 104* 08/06/2012   CHOL 160 10/01/2012   TRIG 64.0 10/01/2012   HDL 48.90 10/01/2012   LDLCALC 98 10/01/2012   ALT 21 10/01/2012   AST 13 10/01/2012   NA 140 08/06/2012   K 3.8 08/06/2012   CL 105 08/06/2012   CREATININE 0.8 08/06/2012   BUN 16 08/06/2012   CO2 26 08/06/2012   INR 1.54* 05/07/2012   HGBA1C 6.1* 05/05/2012    Echo reviewed from 07/2012, mild ai with intact graft and leaflets   Assessment / Plan:   Steady post op improvement after valve spearing aortic root replacemnt and Cabg x1 to RCA.  Will see back with repeat cta of chest in 7 months,  left pleural effusion resolved Will need interval echo in future   Delight Ovens MD 10/07/2012 11:04 AM

## 2012-10-07 NOTE — Patient Instructions (Signed)
Doing well post op See me on July with CT of chest

## 2012-11-22 ENCOUNTER — Telehealth: Payer: Self-pay | Admitting: Cardiology

## 2012-11-22 MED ORDER — FELODIPINE ER 10 MG PO TB24: ORAL_TABLET | ORAL | Status: DC

## 2012-11-22 NOTE — Telephone Encounter (Signed)
LMTCB

## 2012-11-22 NOTE — Telephone Encounter (Signed)
New Problem   Pt would like to be seen today  C/0 Shortness of breathe Headaches Pressure in chest    Refill Request: feladopine  metformine

## 2012-11-22 NOTE — Telephone Encounter (Signed)
Spoke with pt. Pt states for the last few days he has felt tired, with occ fast heart beat and shortness of breath. Pt states he thinks his symptoms may be because he has been out of his BP medication (felodipine)for several days. He has not had the money to get it filled. He has not checked his BP.  Pt will get refill of felodipine today. He will contact his PCP  to refill metformin. I have given him an appt with Dr Shirlee Latch 11/23/12.

## 2012-11-23 ENCOUNTER — Ambulatory Visit (INDEPENDENT_AMBULATORY_CARE_PROVIDER_SITE_OTHER): Payer: BC Managed Care – PPO | Admitting: Cardiology

## 2012-11-23 ENCOUNTER — Encounter: Payer: Self-pay | Admitting: Cardiology

## 2012-11-23 VITALS — BP 156/88 | HR 68 | Ht 76.0 in | Wt 329.0 lb

## 2012-11-23 DIAGNOSIS — I351 Nonrheumatic aortic (valve) insufficiency: Secondary | ICD-10-CM | POA: Insufficient documentation

## 2012-11-23 DIAGNOSIS — I359 Nonrheumatic aortic valve disorder, unspecified: Secondary | ICD-10-CM

## 2012-11-23 DIAGNOSIS — I10 Essential (primary) hypertension: Secondary | ICD-10-CM

## 2012-11-23 DIAGNOSIS — I509 Heart failure, unspecified: Secondary | ICD-10-CM

## 2012-11-23 DIAGNOSIS — I2510 Atherosclerotic heart disease of native coronary artery without angina pectoris: Secondary | ICD-10-CM

## 2012-11-23 DIAGNOSIS — I5032 Chronic diastolic (congestive) heart failure: Secondary | ICD-10-CM

## 2012-11-23 LAB — BASIC METABOLIC PANEL
BUN: 13 mg/dL (ref 6–23)
CO2: 28 mEq/L (ref 19–32)
Calcium: 9.2 mg/dL (ref 8.4–10.5)
Chloride: 106 mEq/L (ref 96–112)
Creatinine, Ser: 0.9 mg/dL (ref 0.4–1.5)
GFR: 111.03 mL/min (ref >60.00)
Glucose, Bld: 106 mg/dL — ABNORMAL HIGH (ref 70–99)
Potassium: 3.7 mEq/L (ref 3.5–5.1)
Sodium: 141 mEq/L (ref 135–145)

## 2012-11-23 LAB — BRAIN NATRIURETIC PEPTIDE: Pro B Natriuretic peptide (BNP): 268 pg/mL — ABNORMAL HIGH (ref 0.0–100.0)

## 2012-11-23 MED ORDER — POTASSIUM CHLORIDE CRYS ER 20 MEQ PO TBCR: 20.0000 meq | EXTENDED_RELEASE_TABLET | Freq: Every day | ORAL | Status: DC

## 2012-11-23 MED ORDER — METFORMIN HCL 500 MG PO TABS: 500.0000 mg | ORAL_TABLET | Freq: Two times a day (BID) | ORAL | Status: DC

## 2012-11-23 MED ORDER — LISINOPRIL 10 MG PO TABS: 10.0000 mg | ORAL_TABLET | Freq: Every day | ORAL | Status: DC

## 2012-11-23 MED ORDER — CARVEDILOL 12.5 MG PO TABS: 12.5000 mg | ORAL_TABLET | Freq: Two times a day (BID) | ORAL | Status: DC

## 2012-11-23 MED ORDER — ATORVASTATIN CALCIUM 40 MG PO TABS: 40.0000 mg | ORAL_TABLET | Freq: Every day | ORAL | Status: DC

## 2012-11-23 MED ORDER — FUROSEMIDE 40 MG PO TABS: 40.0000 mg | ORAL_TABLET | Freq: Every day | ORAL | Status: DC

## 2012-11-23 NOTE — Progress Notes (Signed)
Patient ID: Jonathan Miles, male   DOB: 08-08-1961, 52 y.o.   MRN: [id number] PCP: Dr. Janace Hoard  52 yo with history of aortic root aneurysm and CAD returns for followup.  He underwent aortic valve sparing aortic root replacement with CABG x1 (SVG-RCA) on 05/07/12.  I noted a new diastolic murmur at last exam and echo in 10/13 showed EF 55-60% with mild to moderate eccentric aortic insufficiency.    Jonathan Miles is back at work full time.  Over the last 1-2 weeks, he has felt more fatigued and has developed exertional dyspnea.  He ran out of felodipine a week ago and BP has been running high.  He just started back on felodipine last night.  He has been getting short of breath after walking 60-70 yards and has noted orthopnea.  No PND.  He has had headaches.  He has also noted some sharp chest pain mainly associated with stress at work and not exertion.  His weight is up 31 lbs since last appointment.  He has been eating more since he has been off amiodarone and is no longer nauseated.    Labs (3/12): K 4, creatinine 1.03, LDL 113, HDL 33  Labs (4/12): K 3.7, creatinine 0.8  Labs (1/13): K 3.6, creatinine 0.9, LDL 133, HDL 42  Labs (7/13):  K 3.9, creatinine 0.91, Hgb 8.9 Labs (12/13): LDL 98, HDL 49   Wt Readings from Last 3 Encounters:  11/23/12 329 lb (149.233 kg)  10/07/12 313 lb (141.976 kg)  07/27/12 298 lb 8 oz (135.399 kg)     Past Medical History: 1. DM  2. Hyperlipidemia  3. HTN  4. Aortic root/ascending aortic aneurysm: 4/12 Echo with 5.4 cm aortic root; 4/12 MRA chest with 5.5 cm aortic root, 3.8 cm ascending thoracic aorta; 10/12 CTA chest with 6.1 x 5.1 cm aortic root/proximal ascending aorta, 4.2 x 4.1 cm mid ascending aorta; 1/13 Echo with 5.4 cm root, 5.5 cm proximal ascending thoracic aorta. S/p valve-sparing aortic root and ascending aorta replacement with CABG x 1 (SVG-RCA) 04/2012 with Dr. Tyrone Sage. 5. Aortic insufficiency: Echo (1/13): EF 55%, mild LVH, 5.4 cm aortic  root, 5.5 cm proximal ascending thoracic aorta. Trileaflet aortic valve with trivial AI.  Echo (10/13) EF 55-60%, s/p valve-sparing aortic root replacement, eccentric mild to moderate AI, normal RV.  6. Possible contrast allergy  7. CAD: Myoview (4/12): EF 67%, no ischemia or infarction.  LHC (1/13) with long 50% D2 stenosis, 60% proximal RCA, and 50% mid RCA. S/p CABG x 1 with SVG0-RCA at time of ascending aortic aneurysm repair. 8. ? Brief peri-operative atrial fibrillation in 7/13. Nausea with amiodarone.  9. Diastolic CHF   Current Outpatient Prescriptions  Medication Sig Dispense Refill  . acetaminophen (TYLENOL) 500 MG tablet TAKE 1-2 TABS EVERY 6 HOURS AS NEEDED FOR PAIN      . aspirin 325 MG tablet Take 325 mg by mouth daily.      . felodipine (PLENDIL) 10 MG 24 hr tablet 1 daily  30 tablet  3  . metFORMIN (GLUCOPHAGE) 500 MG tablet Take 1 tablet (500 mg total) by mouth 2 (two) times daily with a meal. Additional refills should be authorized by Dr Alwyn Ren or PCP.  60 tablet  0  . methocarbamol (ROBAXIN) 500 MG tablet TAKE 1-2 TABLETS AT BEDTIME AS NEEDED FOR MUSCLE SPASMS  30 tablet  0  . atorvastatin (LIPITOR) 40 MG tablet Take 1 tablet (40 mg total) by mouth daily.  30 tablet  3  . carvedilol (COREG) 12.5 MG tablet Take 1 tablet (12.5 mg total) by mouth 2 (two) times daily.  60 tablet  3  . furosemide (LASIX) 40 MG tablet Take 1 tablet (40 mg total) by mouth daily.  30 tablet  3  . lisinopril (PRINIVIL,ZESTRIL) 10 MG tablet Take 1 tablet (10 mg total) by mouth daily.  30 tablet  3  . potassium chloride SA (K-DUR,KLOR-CON) 20 MEQ tablet Take 1 tablet (20 mEq total) by mouth daily.  30 tablet  3    Allergies: Allergies  Allergen Reactions  . Iodinated Diagnostic Agents Hives    Hives started 4 days after cta chest was performed, minimal relief w/ benadryl x 3 days, uncertain if reaction was actually iv contrast or other allergen,allergy tests to follow.wife will make [place] aware of  results//a.calhoun    History  Substance Use Topics  . Smoking status: Former Smoker -- 1.0 packs/day for 13 years    Types: Cigarettes    Quit date: 01/23/1988  . Smokeless tobacco: Never Used  . Alcohol Use: No     ROS:  All systems reviewed and negative except as per HPI.   PHYSICAL EXAM: VS:  BP 156/88  Pulse 68  Ht 6\' 4"  (1.93 m)  Wt 329 lb (149.233 kg)  BMI 40.05 kg/m2  SpO2 96% General: Obese, NAD HEENT: normal Neck: JVP 10-12 cm Cardiac:  normal S1, S2; RRR; 2/6 diastolic murmur LUSB Chest: Median sternotomy scar well-healed without erythema or discharge Lungs:  clear to auscultation bilaterally, no wheezing, rhonchi or rales Abd: soft, nontender, no hepatomegaly Ext: Trace ankle edema Skin: warm and dry  Assessment/Plan:  CAD  Moderate RCA stenosis on cath, now s/p SVG-RCA with recent aortic root/ascending aorta aneurysm replacement. Continue ASA, statin. He has had only atypical, nonexertional chest pain that may be related to his sternotomy scar.  Aortic root dilatation  Status post aortic root/ascending aorta valve-sparing replacement. He will have followup CTA chest through Dr. Dennie Maizes office.  Diastolic CHF Patient is volume overloaded on exam with with NYHA class III symptoms.  Elevated blood pressure since running out of felodipine may play a role.  He had mild to moderate AI on last echo, and high BP could certainly make AI worse, leading to CHF.  Weight is up considerably.  Some of this is due to eating more since stopping amiodarone (was making him nauseated), but some of this is likely due to fluid retention. - BNP today - Add Lasix 40 mg daily + KCl 20 mEq daily - BMET/BNP in 2 wks.  Hypertension BP too high.  Increase Coreg to 12.5 mg bid and add lisinopril 10 mg daily.    Aortic insufficiency Murmur noted on exam.  Mild to moderate eccentric AI on echo. This will need to be followed over time.  Need also good BP control.  Repeat echo in 10/14.    [name] 11/23/2012

## 2012-11-23 NOTE — Patient Instructions (Addendum)
Increase coreg(carvedilol) to 12.5mg  two times a day. You can take 2 of your 6.25mg  tablets two times a day and use your current supply.  Start lasix 40mg  daily.  Start KCL(potassium) 20 mEq daily.   Start lisinopril 10mg  daily.  Your physician recommends that you return for lab work today--BMET/BNP.  Your physician recommends that you schedule a follow-up appointment in: 2 weeks with Dr Shirlee Latch.  Your physician recommends that you return for lab work in: 2 weeks when you see Dr McLean--BMET/BNP.

## 2012-12-09 ENCOUNTER — Ambulatory Visit: Payer: BC Managed Care – PPO | Admitting: Cardiology

## 2012-12-09 ENCOUNTER — Other Ambulatory Visit: Payer: BC Managed Care – PPO

## 2012-12-11 ENCOUNTER — Other Ambulatory Visit: Payer: Self-pay

## 2012-12-12 ENCOUNTER — Encounter: Payer: Self-pay | Admitting: Cardiology

## 2012-12-13 NOTE — Telephone Encounter (Signed)
Spoke with pt on the telephone about appt.

## 2012-12-14 ENCOUNTER — Ambulatory Visit: Payer: BC Managed Care – PPO | Admitting: Cardiology

## 2012-12-15 ENCOUNTER — Other Ambulatory Visit (INDEPENDENT_AMBULATORY_CARE_PROVIDER_SITE_OTHER): Payer: BC Managed Care – PPO

## 2012-12-15 ENCOUNTER — Other Ambulatory Visit: Payer: BC Managed Care – PPO

## 2012-12-15 ENCOUNTER — Ambulatory Visit (INDEPENDENT_AMBULATORY_CARE_PROVIDER_SITE_OTHER): Payer: BC Managed Care – PPO | Admitting: Cardiology

## 2012-12-15 ENCOUNTER — Encounter: Payer: Self-pay | Admitting: Cardiology

## 2012-12-15 VITALS — BP 132/88 | HR 76 | Ht 76.0 in | Wt 326.0 lb

## 2012-12-15 DIAGNOSIS — I5032 Chronic diastolic (congestive) heart failure: Secondary | ICD-10-CM

## 2012-12-15 DIAGNOSIS — R0989 Other specified symptoms and signs involving the circulatory and respiratory systems: Secondary | ICD-10-CM

## 2012-12-15 DIAGNOSIS — I509 Heart failure, unspecified: Secondary | ICD-10-CM

## 2012-12-15 DIAGNOSIS — I10 Essential (primary) hypertension: Secondary | ICD-10-CM

## 2012-12-15 DIAGNOSIS — I2510 Atherosclerotic heart disease of native coronary artery without angina pectoris: Secondary | ICD-10-CM

## 2012-12-15 DIAGNOSIS — I77810 Thoracic aortic ectasia: Secondary | ICD-10-CM

## 2012-12-15 DIAGNOSIS — I359 Nonrheumatic aortic valve disorder, unspecified: Secondary | ICD-10-CM

## 2012-12-15 DIAGNOSIS — E785 Hyperlipidemia, unspecified: Secondary | ICD-10-CM

## 2012-12-15 DIAGNOSIS — I351 Nonrheumatic aortic (valve) insufficiency: Secondary | ICD-10-CM

## 2012-12-15 LAB — BASIC METABOLIC PANEL
BUN: 14 mg/dL (ref 6–23)
CO2: 28 mEq/L (ref 19–32)
Calcium: 9.3 mg/dL (ref 8.4–10.5)
Chloride: 105 mEq/L (ref 96–112)
Creatinine, Ser: 1 mg/dL (ref 0.4–1.5)
GFR: 104.42 mL/min (ref >60.00)
Glucose, Bld: 119 mg/dL — ABNORMAL HIGH (ref 70–99)
Potassium: 4 mEq/L (ref 3.5–5.1)
Sodium: 140 mEq/L (ref 135–145)

## 2012-12-15 LAB — BRAIN NATRIURETIC PEPTIDE: Pro B Natriuretic peptide (BNP): 48 pg/mL (ref 0.0–100.0)

## 2012-12-15 NOTE — Patient Instructions (Addendum)
Your physician recommends that you have lab work today--BMET/BNP.  Your physician recommends that you schedule a follow-up appointment in: 3 months with Dr Shirlee Latch.   Call Lupita Leash at the [facility] [phone] and she will make an appointment for you.

## 2012-12-15 NOTE — Progress Notes (Signed)
Patient ID: Jonathan Miles, male   DOB: [birth year], 52 y.o.   MRN: [id number] PCP: Jonathan Miles  52 yo with history of aortic root aneurysm and CAD returns for followup.  He underwent aortic valve sparing aortic root replacement with CABG x1 (SVG-RCA) on 05/07/12.  I noted a new diastolic murmur on exam and echo in 10/13 showed EF 55-60% with mild to moderate eccentric aortic insufficiency.  Prior to last appointment, Jonathan Miles had developed significant exertional dyspnea, weight was up, and BP was running high.  I started him on lisinopril, increased Coreg, and added Lasix.   Since last appointment, he has lost 3 lbs.  Breathing is much better.  He can walk up to 4 blocks now without dyspnea.  Orthopnea has resolved.  He can get up stairs without difficulty.  No chest pain.      Labs (3/12): K 4, creatinine 1.03, LDL 113, HDL 33  Labs (4/12): K 3.7, creatinine 0.8  Labs (1/13): K 3.6, creatinine 0.9, LDL 133, HDL 42  Labs (7/13):  K 3.9, creatinine 0.91, Hgb 8.9 Labs (12/13): LDL 98, HDL 49 Labs (1/14): K 3.7, creatinine 0.9, BNP 268   Wt Readings from Last 3 Encounters:  12/15/12 326 lb (147.873 kg)  11/23/12 329 lb (149.233 kg)  10/07/12 313 lb (141.976 kg)     Past Medical History: 1. DM  2. Hyperlipidemia  3. HTN  4. Aortic root/ascending aortic aneurysm: 4/12 Echo with 5.4 cm aortic root; 4/12 MRA chest with 5.5 cm aortic root, 3.8 cm ascending thoracic aorta; 10/12 CTA chest with 6.1 x 5.1 cm aortic root/proximal ascending aorta, 4.2 x 4.1 cm mid ascending aorta; 1/13 Echo with 5.4 cm root, 5.5 cm proximal ascending thoracic aorta. S/p valve-sparing aortic root and ascending aorta replacement with CABG x 1 (SVG-RCA) 04/2012 with Jonathan Miles. 5. Aortic insufficiency: Echo (1/13): EF 55%, mild LVH, 5.4 cm aortic root, 5.5 cm proximal ascending thoracic aorta. Trileaflet aortic valve with trivial AI.  Echo (10/13) EF 55-60%, s/p valve-sparing aortic root replacement, eccentric  mild to moderate AI, normal RV.  6. Possible contrast allergy  7. CAD: Myoview (4/12): EF 67%, no ischemia or infarction.  LHC (1/13) with long 50% D2 stenosis, 60% proximal RCA, and 50% mid RCA. S/p CABG x 1 with SVG0-RCA at time of ascending aortic aneurysm repair. 8. ? Brief peri-operative atrial fibrillation in 7/13. Nausea with amiodarone.  9. Diastolic CHF   Current Outpatient Prescriptions  Medication Sig Dispense Refill  . acetaminophen (TYLENOL) 500 MG tablet TAKE 1-2 TABS EVERY 6 HOURS AS NEEDED FOR PAIN      . aspirin 325 MG tablet Take 325 mg by mouth daily.      Marland Kitchen atorvastatin (LIPITOR) 40 MG tablet Take 1 tablet (40 mg total) by mouth daily.  30 tablet  3  . carvedilol (COREG) 12.5 MG tablet Take 1 tablet (12.5 mg total) by mouth 2 (two) times daily.  60 tablet  3  . felodipine (PLENDIL) 10 MG 24 hr tablet 1 daily  30 tablet  3  . furosemide (LASIX) 40 MG tablet Take 1 tablet (40 mg total) by mouth daily.  30 tablet  3  . lisinopril (PRINIVIL,ZESTRIL) 10 MG tablet Take 1 tablet (10 mg total) by mouth daily.  30 tablet  3  . metFORMIN (GLUCOPHAGE) 500 MG tablet Take 1 tablet (500 mg total) by mouth 2 (two) times daily with a meal. Additional refills should be authorized by Dr Alwyn Ren or  PCP.  60 tablet  0  . methocarbamol (ROBAXIN) 500 MG tablet TAKE 1-2 TABLETS AT BEDTIME AS NEEDED FOR MUSCLE SPASMS  30 tablet  0  . potassium chloride SA (K-DUR,KLOR-CON) 20 MEQ tablet Take 1 tablet (20 mEq total) by mouth daily.  30 tablet  3   No current facility-administered medications for this visit.    Allergies: Allergies  Allergen Reactions  . Iodinated Diagnostic Agents Hives    Hives started 4 days after cta chest was performed, minimal relief w/ benadryl x 3 days, uncertain if reaction was actually iv contrast or other allergen,allergy tests to follow.wife will make [place] aware of results//Jonathan Miles    History  Substance Use Topics  . Smoking status: Former Smoker -- 1.00 packs/day  for 13 years    Types: Cigarettes    Quit date: 01/23/1988  . Smokeless tobacco: Never Used  . Alcohol Use: No    PHYSICAL EXAM: VS:  BP 132/88  Pulse 76  Ht 6\' 4"  (1.93 m)  Wt 326 lb (147.873 kg)  BMI 39.7 kg/m2  SpO2 96% General: Obese, NAD HEENT: normal Neck: JVP not elevated Cardiac:  normal S1, S2; RRR; 2/6 diastolic murmur LUSB Chest: Median sternotomy scar well-healed Lungs:  clear to auscultation bilaterally, no wheezing, rhonchi or rales Abd: soft, nontender, no hepatomegaly Ext: No edema Skin: warm and dry  Assessment/Plan:  CAD  Moderate RCA stenosis on cath, now s/p SVG-RCA with recent aortic root/ascending aorta aneurysm replacement. Continue ASA, statin. No chest pain. Aortic root dilatation  Status post aortic root/ascending aorta valve-sparing replacement. He will have followup CTA chest through Jonathan Miles office.  Diastolic CHF Since starting Lasix, symptoms are improved.  Weight is down.  He does not look volume overloaded today.  NYHA class II symptoms.  Continue current Lasix and KCl, needs BMET and BNP today.  Hypertension BP now within normal limits.  Continue current doses of Coreg and lisinopril.  Aortic insufficiency Murmur noted on exam.  Mild to moderate eccentric AI on echo. This will need to be followed over time. Need also good BP control.  Repeat echo in 10/14.   Jonathan Miles 12/15/2012

## 2012-12-24 ENCOUNTER — Other Ambulatory Visit: Payer: BC Managed Care – PPO

## 2012-12-24 ENCOUNTER — Ambulatory Visit: Payer: BC Managed Care – PPO | Admitting: Cardiology

## 2013-02-09 ENCOUNTER — Encounter: Payer: Self-pay | Admitting: Nurse Practitioner

## 2013-02-09 ENCOUNTER — Other Ambulatory Visit (INDEPENDENT_AMBULATORY_CARE_PROVIDER_SITE_OTHER): Payer: BC Managed Care – PPO

## 2013-02-09 ENCOUNTER — Ambulatory Visit (INDEPENDENT_AMBULATORY_CARE_PROVIDER_SITE_OTHER): Payer: BC Managed Care – PPO | Admitting: Nurse Practitioner

## 2013-02-09 VITALS — BP 160/84 | HR 70 | Ht 76.0 in | Wt 325.0 lb

## 2013-02-09 DIAGNOSIS — I5032 Chronic diastolic (congestive) heart failure: Secondary | ICD-10-CM

## 2013-02-09 DIAGNOSIS — I259 Chronic ischemic heart disease, unspecified: Secondary | ICD-10-CM

## 2013-02-09 DIAGNOSIS — I2510 Atherosclerotic heart disease of native coronary artery without angina pectoris: Secondary | ICD-10-CM

## 2013-02-09 DIAGNOSIS — I10 Essential (primary) hypertension: Secondary | ICD-10-CM

## 2013-02-09 DIAGNOSIS — I509 Heart failure, unspecified: Secondary | ICD-10-CM

## 2013-02-09 DIAGNOSIS — E785 Hyperlipidemia, unspecified: Secondary | ICD-10-CM

## 2013-02-09 LAB — BASIC METABOLIC PANEL
BUN: 10 mg/dL (ref 6–23)
CO2: 25 mEq/L (ref 19–32)
Calcium: 9 mg/dL (ref 8.4–10.5)
Chloride: 105 mEq/L (ref 96–112)
Creatinine, Ser: 0.8 mg/dL (ref 0.4–1.5)
GFR: 123.21 mL/min (ref >60.00)
Glucose, Bld: 116 mg/dL — ABNORMAL HIGH (ref 70–99)
Potassium: 3.6 mEq/L (ref 3.5–5.1)
Sodium: 139 mEq/L (ref 135–145)

## 2013-02-09 LAB — BRAIN NATRIURETIC PEPTIDE: Pro B Natriuretic peptide (BNP): 56 pg/mL (ref 0.0–100.0)

## 2013-02-09 NOTE — Patient Instructions (Addendum)
We will check labs today  Here are my tips to lose weight:  1. Drink only water. You do not need milk, juice, tea, soda or diet soda.  2. Do not eat anything "white". This includes white bread, potatoes, rice or mayo  3. Stay away from fried foods and sweets  4. Your portion should be the size of the palm of your hand.  5. Know what your weaknesses are and avoid.  6. Find an exercise you like and do it every day for 45 to 60 minutes.       We will see you back in 3 months Dr. Shirlee Latch   Call the [facility] office at [phone] if you have any questions, problems or concerns.

## 2013-02-09 NOTE — Progress Notes (Addendum)
Jonathan Miles Date of Birth: [birth year] Medical Record #[id number]  History of Present Illness: Mr. Wanat is seen back today for a work in visit. He is seen for Dr. Shirlee Latch. He has multiple medical issues as outlined in his history. Last seen here in February and was felt to be doing ok. Referred to the [facility].   He comes in today. He is here with his wife. He is back for an early visit. He is complaining of itching over his incision site - has a pretty large keloid. Notes some sticking/pins/needle-like sensation that is very fleeting. Has some dullness if he twists or turns. He is worried. Did go to the info session for bariatric surgery but says he is not interested in any surgery. He admits that his portions are too large. Not exercising. No medicines today because he is fasting. Sugars have been ok.    Current Outpatient Prescriptions on File Prior to Visit  Medication Sig Dispense Refill  . acetaminophen (TYLENOL) 500 MG tablet TAKE 1-2 TABS EVERY 6 HOURS AS NEEDED FOR PAIN      . aspirin 325 MG tablet Take 325 mg by mouth daily.      Marland Kitchen atorvastatin (LIPITOR) 40 MG tablet Take 1 tablet (40 mg total) by mouth daily.  30 tablet  3  . carvedilol (COREG) 12.5 MG tablet Take 1 tablet (12.5 mg total) by mouth 2 (two) times daily.  60 tablet  3  . felodipine (PLENDIL) 10 MG 24 hr tablet 1 daily  30 tablet  3  . furosemide (LASIX) 40 MG tablet Take 1 tablet (40 mg total) by mouth daily.  30 tablet  3  . lisinopril (PRINIVIL,ZESTRIL) 10 MG tablet Take 1 tablet (10 mg total) by mouth daily.  30 tablet  3  . metFORMIN (GLUCOPHAGE) 500 MG tablet Take 1 tablet (500 mg total) by mouth 2 (two) times daily with a meal. Additional refills should be authorized by Dr Alwyn Ren or PCP.  60 tablet  0  . methocarbamol (ROBAXIN) 500 MG tablet TAKE 1-2 TABLETS AT BEDTIME AS NEEDED FOR MUSCLE SPASMS  30 tablet  0  . potassium chloride SA (K-DUR,KLOR-CON) 20 MEQ tablet Take 1 tablet (20 mEq  total) by mouth daily.  30 tablet  3   No current facility-administered medications on file prior to visit.    Allergies  Allergen Reactions  . Iodinated Diagnostic Agents Hives    Hives started 4 days after cta chest was performed, minimal relief w/ benadryl x 3 days, uncertain if reaction was actually iv contrast or other allergen,allergy tests to follow.wife will make [place] aware of results//a.calhoun   Past Medical History:  1. DM  2. Hyperlipidemia  3. HTN  4. Aortic root/ascending aortic aneurysm: 4/12 Echo with 5.4 cm aortic root; 4/12 MRA chest with 5.5 cm aortic root, 3.8 cm ascending thoracic aorta; 10/12 CTA chest with 6.1 x 5.1 cm aortic root/proximal ascending aorta, 4.2 x 4.1 cm mid ascending aorta; 1/13 Echo with 5.4 cm root, 5.5 cm proximal ascending thoracic aorta. S/p valve-sparing aortic root and ascending aorta replacement with CABG x 1 (SVG-RCA) 04/2012 with Dr. Tyrone Sage.  5. Aortic insufficiency: Echo (1/13): EF 55%, mild LVH, 5.4 cm aortic root, 5.5 cm proximal ascending thoracic aorta. Trileaflet aortic valve with trivial AI. Echo (10/13) EF 55-60%, s/p valve-sparing aortic root replacement, eccentric mild to moderate AI, normal RV.  6. Possible contrast allergy  7. CAD: Myoview (4/12): EF 67%, no ischemia or infarction.  LHC (1/13) with long 50% D2 stenosis, 60% proximal RCA, and 50% mid RCA. S/p CABG x 1 with SVG0-RCA at time of ascending aortic aneurysm repair.  8. ? Brief peri-operative atrial fibrillation in 7/13. Nausea with amiodarone.  9. Diastolic CHF   Past Surgical History  Procedure Laterality Date  . Knee arthroscopy  2010    left  . Retactment of fingers      as a child , sewed with sewing machine- Index and middle finger  . Colonscopy    . Upper gastrointestinal endoscopy    . Coronary artery bypass graft  05/07/2012    Procedure: CORONARY ARTERY BYPASS GRAFTING (CABG);  Surgeon: Delight Ovens, MD;  Location: [facility] OR;  Service: Open Heart Surgery;   Laterality: N/A;    History  Smoking status  . Former Smoker -- 1.00 packs/day for 13 years  . Types: Cigarettes  . Quit date: 01/23/1988  Smokeless tobacco  . Never Used    History  Alcohol Use No    Family History  Problem Relation Age of Onset  . Adopted: Yes  . Family history unknown: Yes    Review of Systems: The review of systems is per the HPI.  All other systems were reviewed and are negative.  Physical Exam: BP 160/84  Pulse 70  Ht 6\' 4"  (1.93 m)  Wt 325 lb (147.419 kg)  BMI 39.58 kg/m2 Weight is down one pound since his last visit.  Patient is very pleasant and in no acute distress. He is morbidly obese. Skin is warm and dry. Color is normal.  HEENT is unremarkable. Normocephalic/atraumatic. PERRL. Sclera are nonicteric. Neck is supple. No masses. No JVD. Lungs are clear. Cardiac exam shows a regular rate and rhythm. Diastolic murmur noted. Sternal scar with quite large keloid noted. Abdomen is obese but soft. Extremities are without edema. Gait and ROM are intact. No gross neurologic deficits noted.  LABORATORY DATA: Lab Results  Component Value Date   WBC 7.8 05/26/2012   HGB 10.3* 05/26/2012   HCT 31.3* 05/26/2012   PLT 631.0* 05/26/2012   GLUCOSE 119* 12/15/2012   CHOL 160 10/01/2012   TRIG 64.0 10/01/2012   HDL 48.90 10/01/2012   LDLCALC 98 10/01/2012   ALT 21 10/01/2012   AST 13 10/01/2012   NA 140 12/15/2012   K 4.0 12/15/2012   CL 105 12/15/2012   CREATININE 1.0 12/15/2012   BUN 14 12/15/2012   CO2 28 12/15/2012   INR 1.54* 05/07/2012   HGBA1C 6.1* 05/05/2012    Assessment / Plan:  1. CAD  Moderate RCA stenosis on cath, now s/p SVG-RCA with recent aortic root/ascending aorta aneurysm replacement. Continue ASA, statin. The chest pain he is having is felt to be more musculoskeletal in origin. His EKG today is normal.  2. Aortic root dilatation  Status post aortic root/ascending aorta valve-sparing replacement. He is to have followup CTA chest through Dr.  Dennie Maizes office in June/July of this year per Dr. Dennie Maizes last note.   3. Diastolic CHF  He does not look volume overloaded today. NYHA class II symptoms. Continue current Lasix and KCl.   4. Hypertension  BP is up today. He has not had his medicines yet.   5. Aortic insufficiency  Mild to moderate eccentric AI on echo. This will need to be followed over time. Need also good BP control. Repeat echo in 10/14.   6. HLD On statin therapy. Checking labs today.   7. Obesity  We have discussed  my method of weight loss. He needs to add exercise in as well.   We will see him back in 3 months. Patient is agreeable to this plan and will call if any problems develop in the interim.   Rosalio Macadamia, RN, ANP-C [facility] [address] [place], [place]  [address]

## 2013-03-14 ENCOUNTER — Ambulatory Visit: Payer: BC Managed Care – PPO | Admitting: Cardiology

## 2013-03-22 ENCOUNTER — Encounter: Payer: Self-pay | Admitting: Cardiology

## 2013-03-27 ENCOUNTER — Other Ambulatory Visit: Payer: Self-pay | Admitting: Cardiology

## 2013-03-28 ENCOUNTER — Telehealth: Payer: Self-pay | Admitting: Cardiology

## 2013-03-28 DIAGNOSIS — I2510 Atherosclerotic heart disease of native coronary artery without angina pectoris: Secondary | ICD-10-CM

## 2013-03-28 DIAGNOSIS — I10 Essential (primary) hypertension: Secondary | ICD-10-CM

## 2013-03-28 MED ORDER — FELODIPINE ER 10 MG PO TB24: ORAL_TABLET | ORAL | Status: DC

## 2013-03-28 MED ORDER — ATORVASTATIN CALCIUM 40 MG PO TABS: 40.0000 mg | ORAL_TABLET | Freq: Every day | ORAL | Status: DC

## 2013-03-28 MED ORDER — LISINOPRIL 10 MG PO TABS: 10.0000 mg | ORAL_TABLET | Freq: Every day | ORAL | Status: DC

## 2013-03-28 MED ORDER — FUROSEMIDE 40 MG PO TABS: 40.0000 mg | ORAL_TABLET | Freq: Every day | ORAL | Status: DC

## 2013-03-28 MED ORDER — POTASSIUM CHLORIDE CRYS ER 20 MEQ PO TBCR: 20.0000 meq | EXTENDED_RELEASE_TABLET | Freq: Every day | ORAL | Status: DC

## 2013-03-28 MED ORDER — CARVEDILOL 12.5 MG PO TABS: 12.5000 mg | ORAL_TABLET | Freq: Two times a day (BID) | ORAL | Status: DC

## 2013-03-28 NOTE — Telephone Encounter (Signed)
Refills sent to [facility]. 

## 2013-03-28 NOTE — Telephone Encounter (Signed)
Pt aware.

## 2013-03-28 NOTE — Telephone Encounter (Signed)
New problem    Pt called to r/s appt due to a change in dr mclean's schedule and stated he had called [facility] to get refills but since he couldn't get in to see dr Shirlee Latch until 06/16/13-he wanted to make sure he has enough meds to last until the appt

## 2013-04-08 ENCOUNTER — Other Ambulatory Visit: Payer: Self-pay | Admitting: *Deleted

## 2013-04-08 DIAGNOSIS — I77810 Thoracic aortic ectasia: Secondary | ICD-10-CM

## 2013-04-11 ENCOUNTER — Other Ambulatory Visit: Payer: Self-pay | Admitting: *Deleted

## 2013-04-12 ENCOUNTER — Other Ambulatory Visit: Payer: Self-pay | Admitting: *Deleted

## 2013-04-12 DIAGNOSIS — I77810 Thoracic aortic ectasia: Secondary | ICD-10-CM

## 2013-05-11 ENCOUNTER — Ambulatory Visit: Payer: BC Managed Care – PPO | Admitting: Cardiology

## 2013-05-19 ENCOUNTER — Inpatient Hospital Stay
Admission: RE | Admit: 2013-05-19 | Discharge: 2013-05-19 | Disposition: A | Discharge status: Home / Self Care | Payer: Self-pay | Source: Ambulatory Visit | Attending: Cardiothoracic Surgery | Admitting: Cardiothoracic Surgery

## 2013-05-19 ENCOUNTER — Ambulatory Visit: Payer: Self-pay | Admitting: Cardiothoracic Surgery

## 2013-06-09 ENCOUNTER — Other Ambulatory Visit: Payer: Self-pay | Admitting: Emergency Medicine

## 2013-06-09 ENCOUNTER — Ambulatory Visit
Admission: RE | Admit: 2013-06-09 | Discharge: 2013-06-09 | Disposition: A | Discharge status: Home / Self Care | Payer: BC Managed Care – PPO | Source: Ambulatory Visit | Attending: Cardiothoracic Surgery | Admitting: Cardiothoracic Surgery

## 2013-06-09 ENCOUNTER — Encounter: Payer: BC Managed Care – PPO | Admitting: Cardiothoracic Surgery

## 2013-06-09 DIAGNOSIS — I77810 Thoracic aortic ectasia: Secondary | ICD-10-CM

## 2013-06-09 LAB — CREATININE WITH EST GFR
Creat: 0.8 mg/dL (ref 0.50–1.35)
GFR, Est African American: >89 mL/min
GFR, Est Non African American: >89 mL/min

## 2013-06-09 LAB — BUN: BUN: 15 mg/dL (ref 6–23)

## 2013-06-10 ENCOUNTER — Other Ambulatory Visit: Payer: BC Managed Care – PPO

## 2013-06-16 ENCOUNTER — Ambulatory Visit: Payer: Self-pay | Admitting: Cardiology

## 2013-06-16 IMAGING — CR DG CHEST 1V PORT
1 series · 1 of 1 positions shown · non-contrast
Comparison: 05/07/2012

CLINICAL DATA: History of ascending aortic root replacement and
CABG.

PORTABLE CHEST - 1 VIEW

[AP]
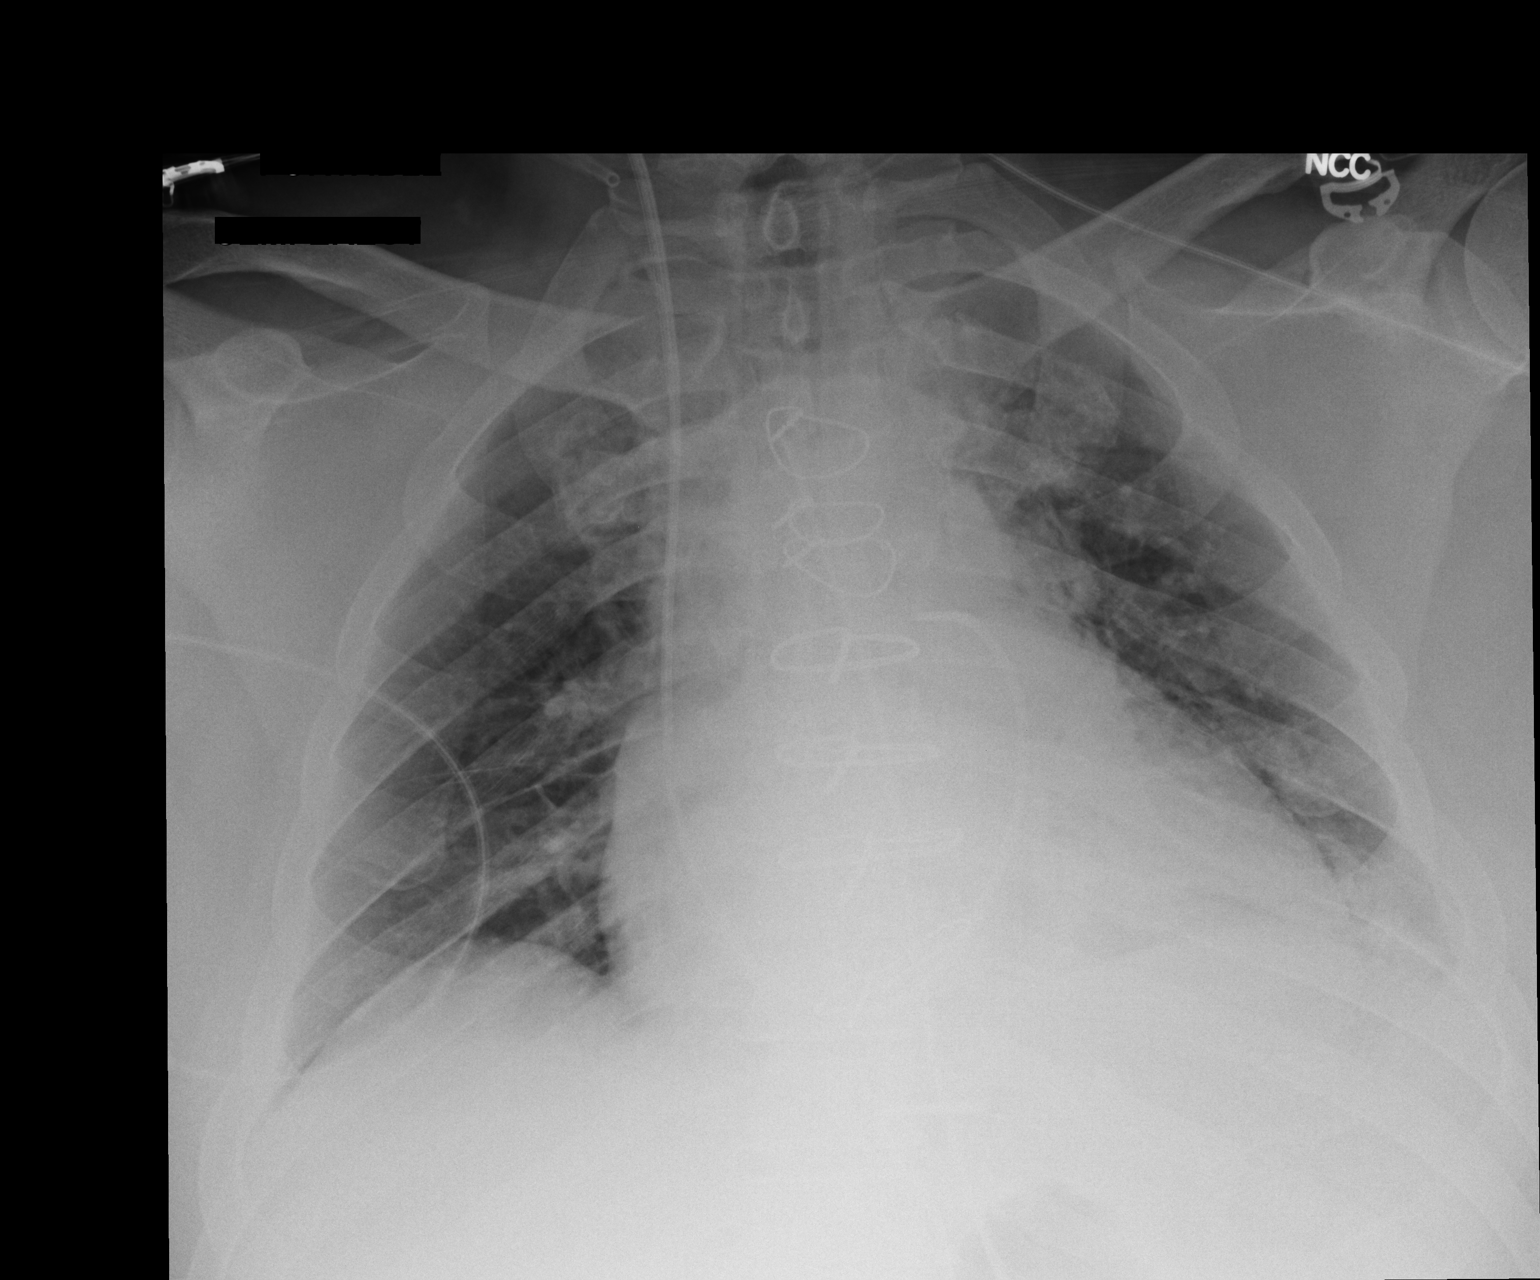

[1 of 1 positions shown; findings below may reference images not displayed]

FINDINGS: Again noted is lucency along the left cardiac border
suggesting a small amount of mediastinal air.  This finding is less
conspicuous compared to the prior examination.  The endotracheal
tube has been removed and slightly low lung volumes.  Swan-Ganz
catheter in the proximal pulmonary artery.  Evidence for a
mediastinal drain.  No evidence for a pneumothorax.  Heart size
remains enlarged.  The nasogastric tube has been removed.
IMPRESSION: Removal of endotracheal tube and slightly decreased lung volumes.

There may be a small amount of pneumomediastinum.  No evidence for
a large pneumothorax.

## 2013-06-17 ENCOUNTER — Ambulatory Visit
Admission: RE | Admit: 2013-06-17 | Discharge: 2013-06-17 | Disposition: A | Discharge status: Home / Self Care | Payer: BC Managed Care – PPO | Source: Ambulatory Visit | Attending: Cardiothoracic Surgery | Admitting: Cardiothoracic Surgery

## 2013-06-17 ENCOUNTER — Ambulatory Visit (INDEPENDENT_AMBULATORY_CARE_PROVIDER_SITE_OTHER): Payer: BC Managed Care – PPO | Admitting: Cardiothoracic Surgery

## 2013-06-17 ENCOUNTER — Encounter: Payer: Self-pay | Admitting: Cardiothoracic Surgery

## 2013-06-17 VITALS — BP 146/84 | HR 80 | Resp 20 | Ht 76.0 in | Wt 320.0 lb

## 2013-06-17 DIAGNOSIS — Z9889 Other specified postprocedural states: Secondary | ICD-10-CM

## 2013-06-17 DIAGNOSIS — J90 Pleural effusion, not elsewhere classified: Secondary | ICD-10-CM

## 2013-06-17 DIAGNOSIS — Z951 Presence of aortocoronary bypass graft: Secondary | ICD-10-CM

## 2013-06-17 DIAGNOSIS — I77810 Thoracic aortic ectasia: Secondary | ICD-10-CM

## 2013-06-17 DIAGNOSIS — I2510 Atherosclerotic heart disease of native coronary artery without angina pectoris: Secondary | ICD-10-CM

## 2013-06-17 MED ORDER — IOHEXOL 350 MG/ML SOLN
80.0000 mL | Freq: Once | INTRAVENOUS | Status: AC | PRN
  Administered 2013-06-17: 80 mL via INTRAVENOUS

## 2013-06-18 IMAGING — CR DG CHEST 1V PORT
1 series · 1 of 1 positions shown · non-contrast
Comparison: 05/09/2012

CLINICAL DATA: Status post aortic root replacement and CABG

PORTABLE CHEST - 1 VIEW

[AP]
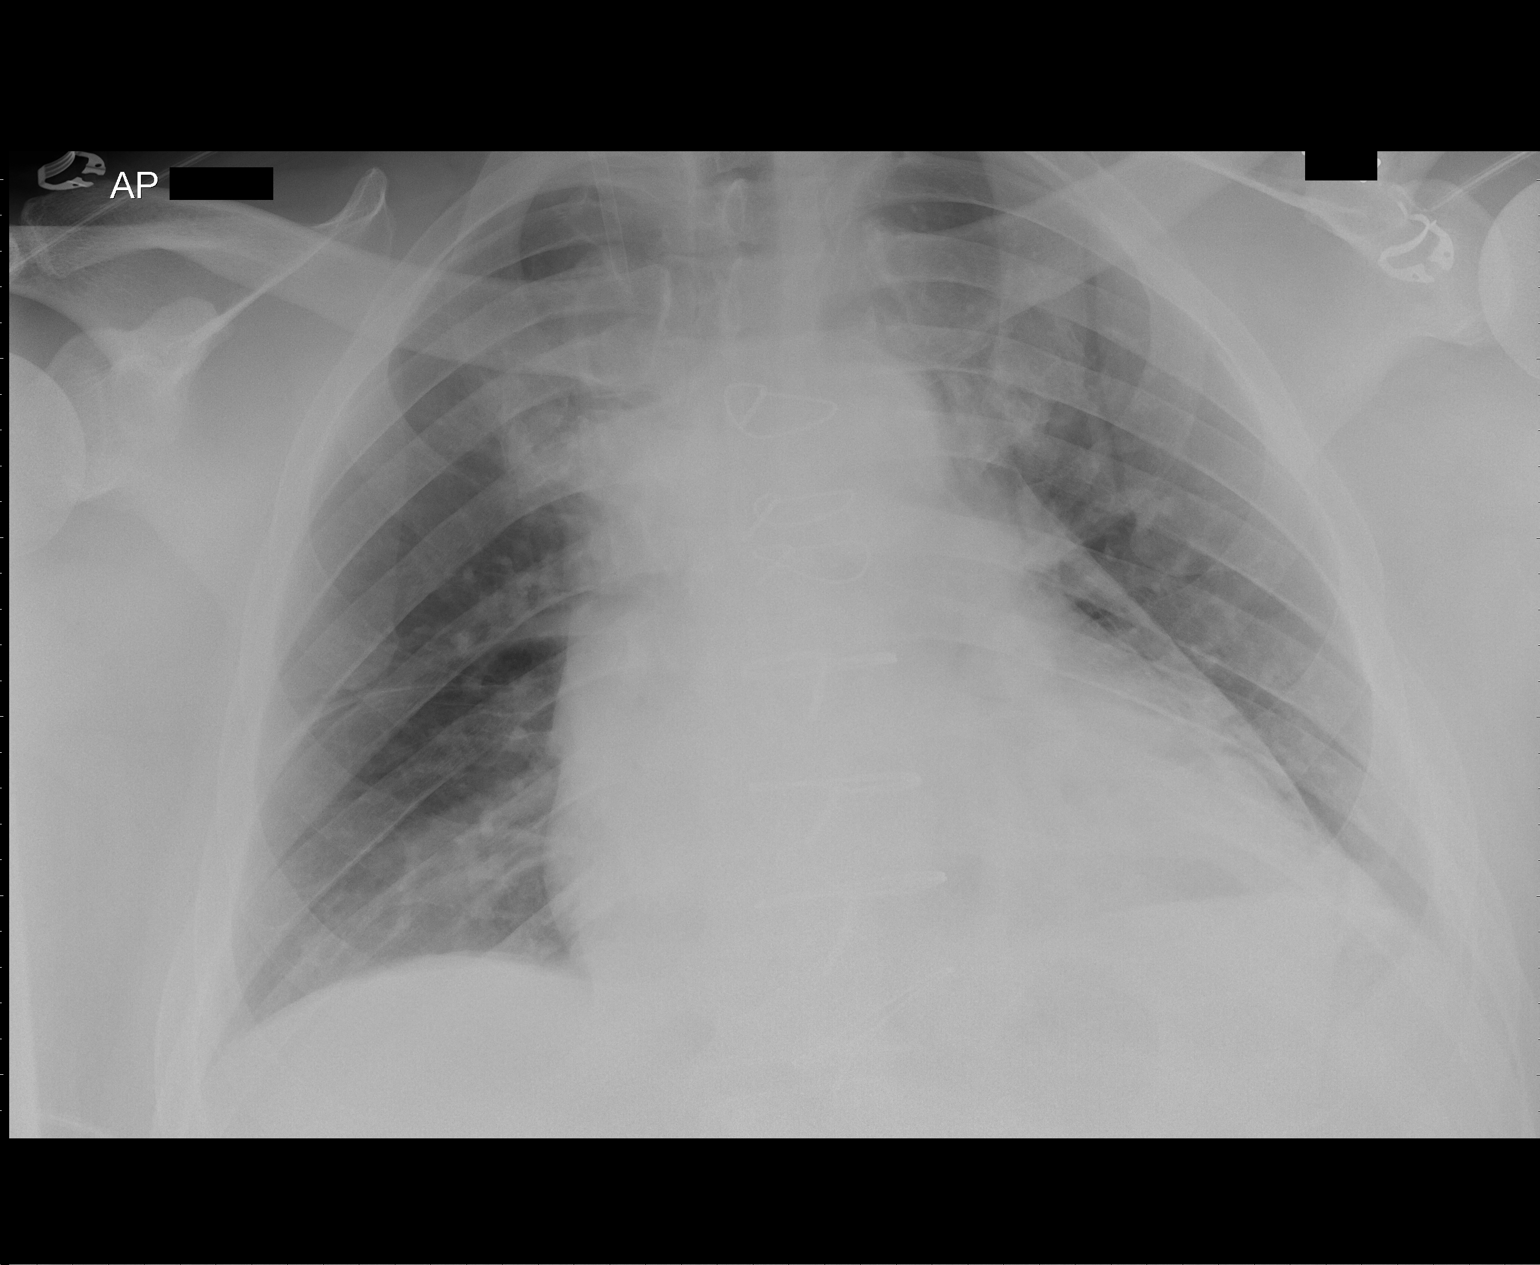

[1 of 1 positions shown; findings below may reference images not displayed]

FINDINGS: Mild bilateral lower lobe atelectasis. No pleural
effusion or pneumothorax.

Stable cardiomegaly.  Postsurgical changes related to prior CABG.
Suspected mild pneumomediastinum.

Interval removal of right IJ Swan-Ganz catheter.  Stable right IJ
venous sheath.
IMPRESSION: Suspected mild pneumomediastinum, likely postsurgical.

Interval removal of right IJ Swan-Ganz catheter.  Stable right IJ
venous sheath.

## 2013-06-19 IMAGING — CR DG CHEST 1V PORT
1 series · 1 of 1 positions shown · non-contrast
Comparison: Portable chest x-ray of 05/10/2014 and 05/09/2012

CLINICAL DATA: Postop, follow-up

PORTABLE CHEST - 1 VIEW

[AP]
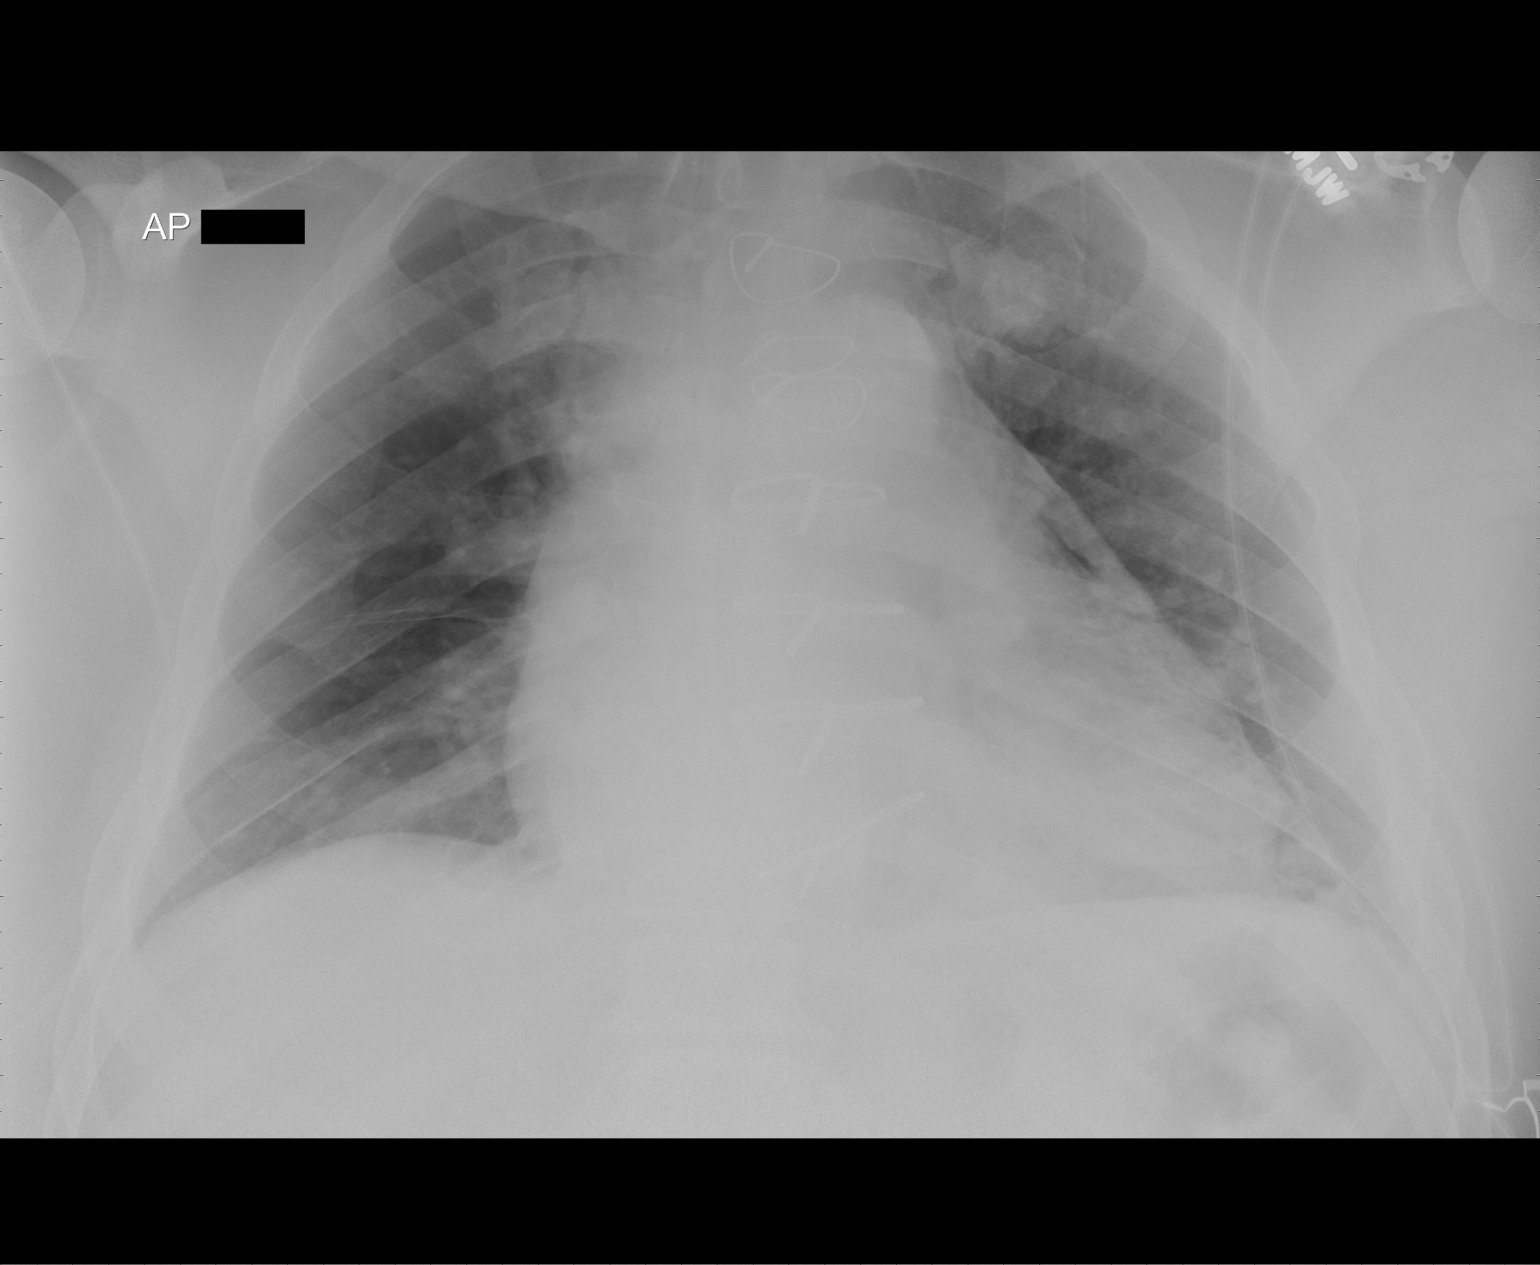

[1 of 1 positions shown; findings below may reference images not displayed]

FINDINGS: There is little change in probable pneumomediastinum,
with cardiomegaly.  Bibasilar atelectasis remains.  The lungs are
not optimally aerated.  A venous sheath is in the SVC.  No
pneumothorax is seen.
IMPRESSION: 1.  No change in probable pneumomediastinum with cardiomegaly.
2.  Bibasilar atelectasis.

## 2013-06-20 IMAGING — CR DG CHEST 2V
2 series · 2 of 2 positions shown · non-contrast
Comparison: 05/11/2012

CLINICAL DATA: Chest pain, post chest tube removal

CHEST - 2 VIEW

[w chest pa]
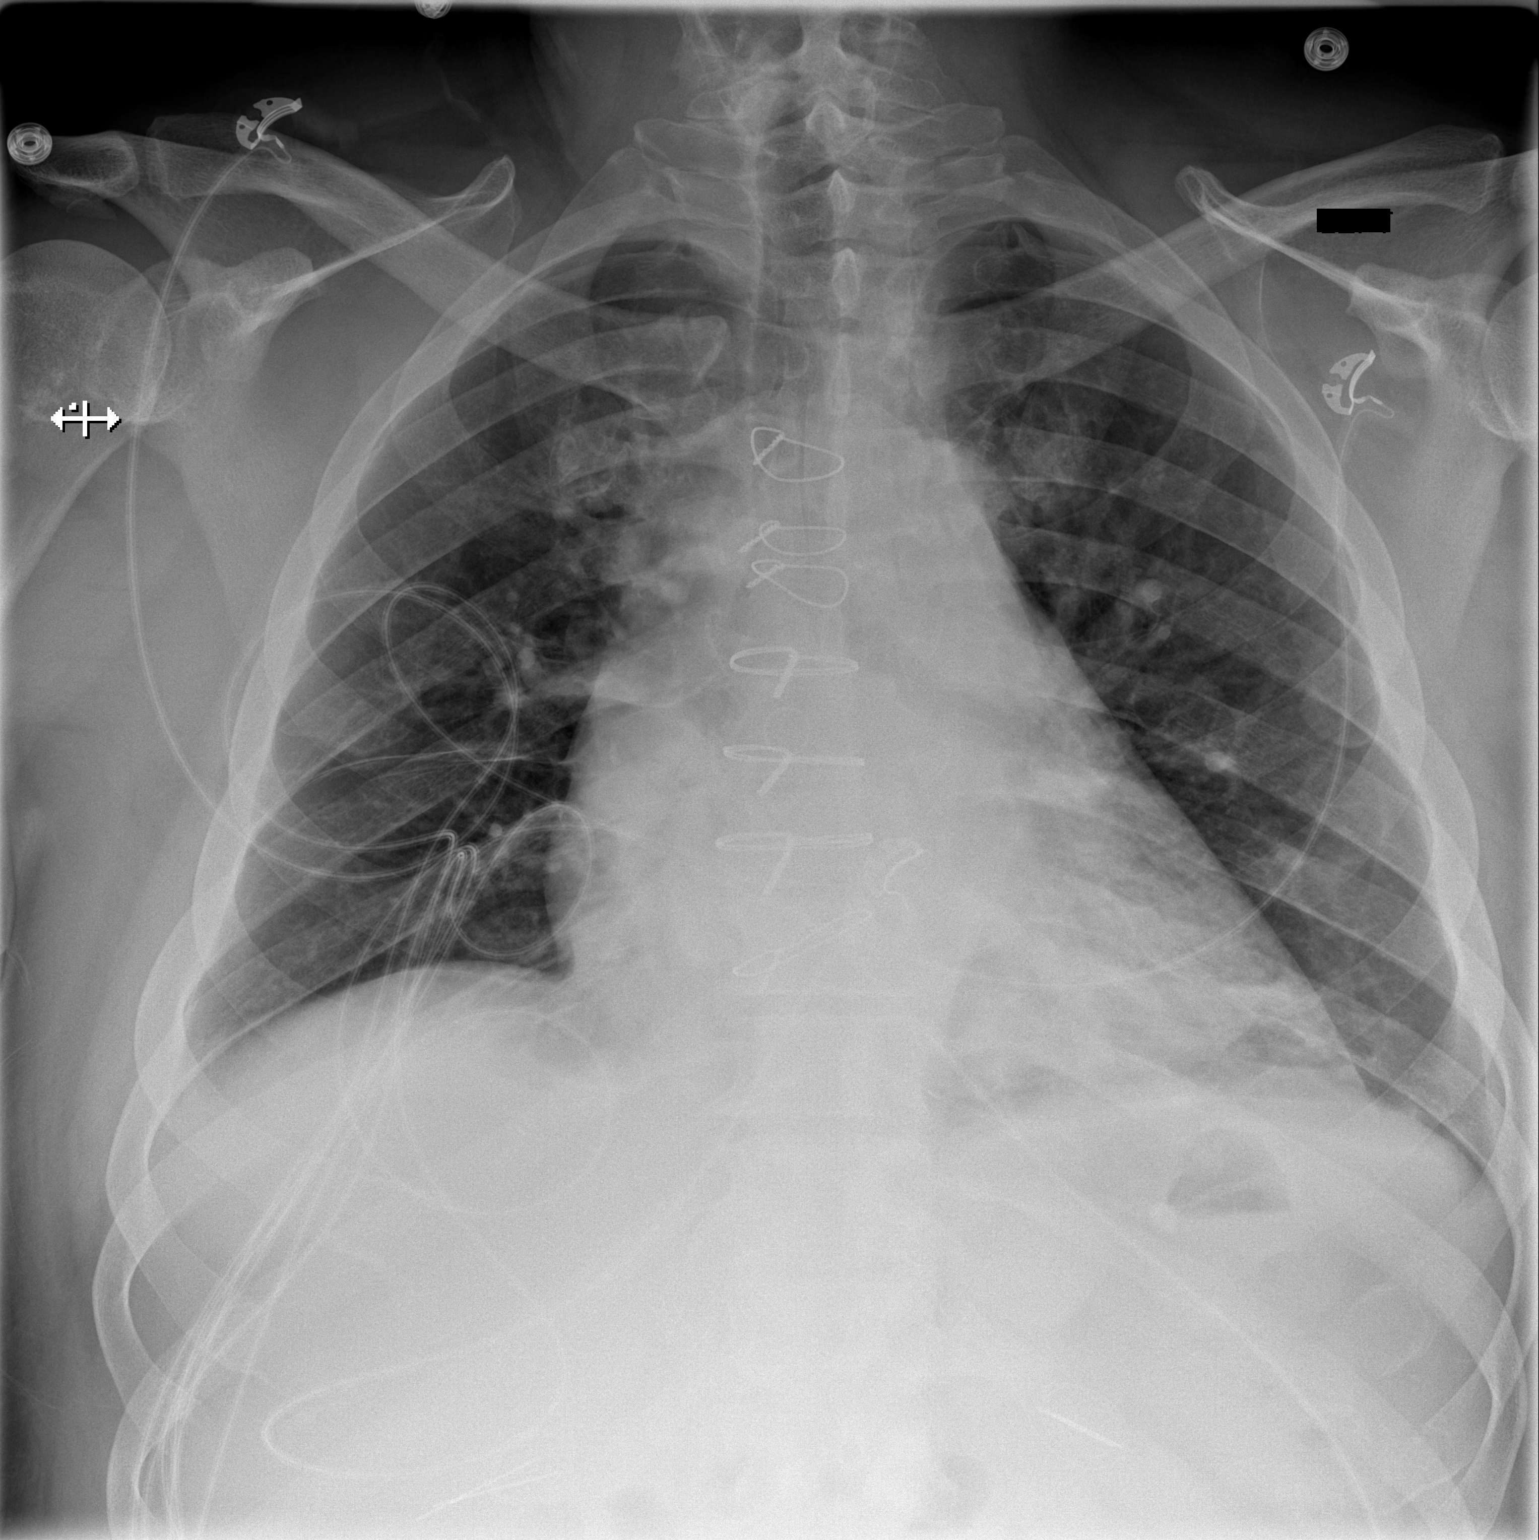

[w chest lat]
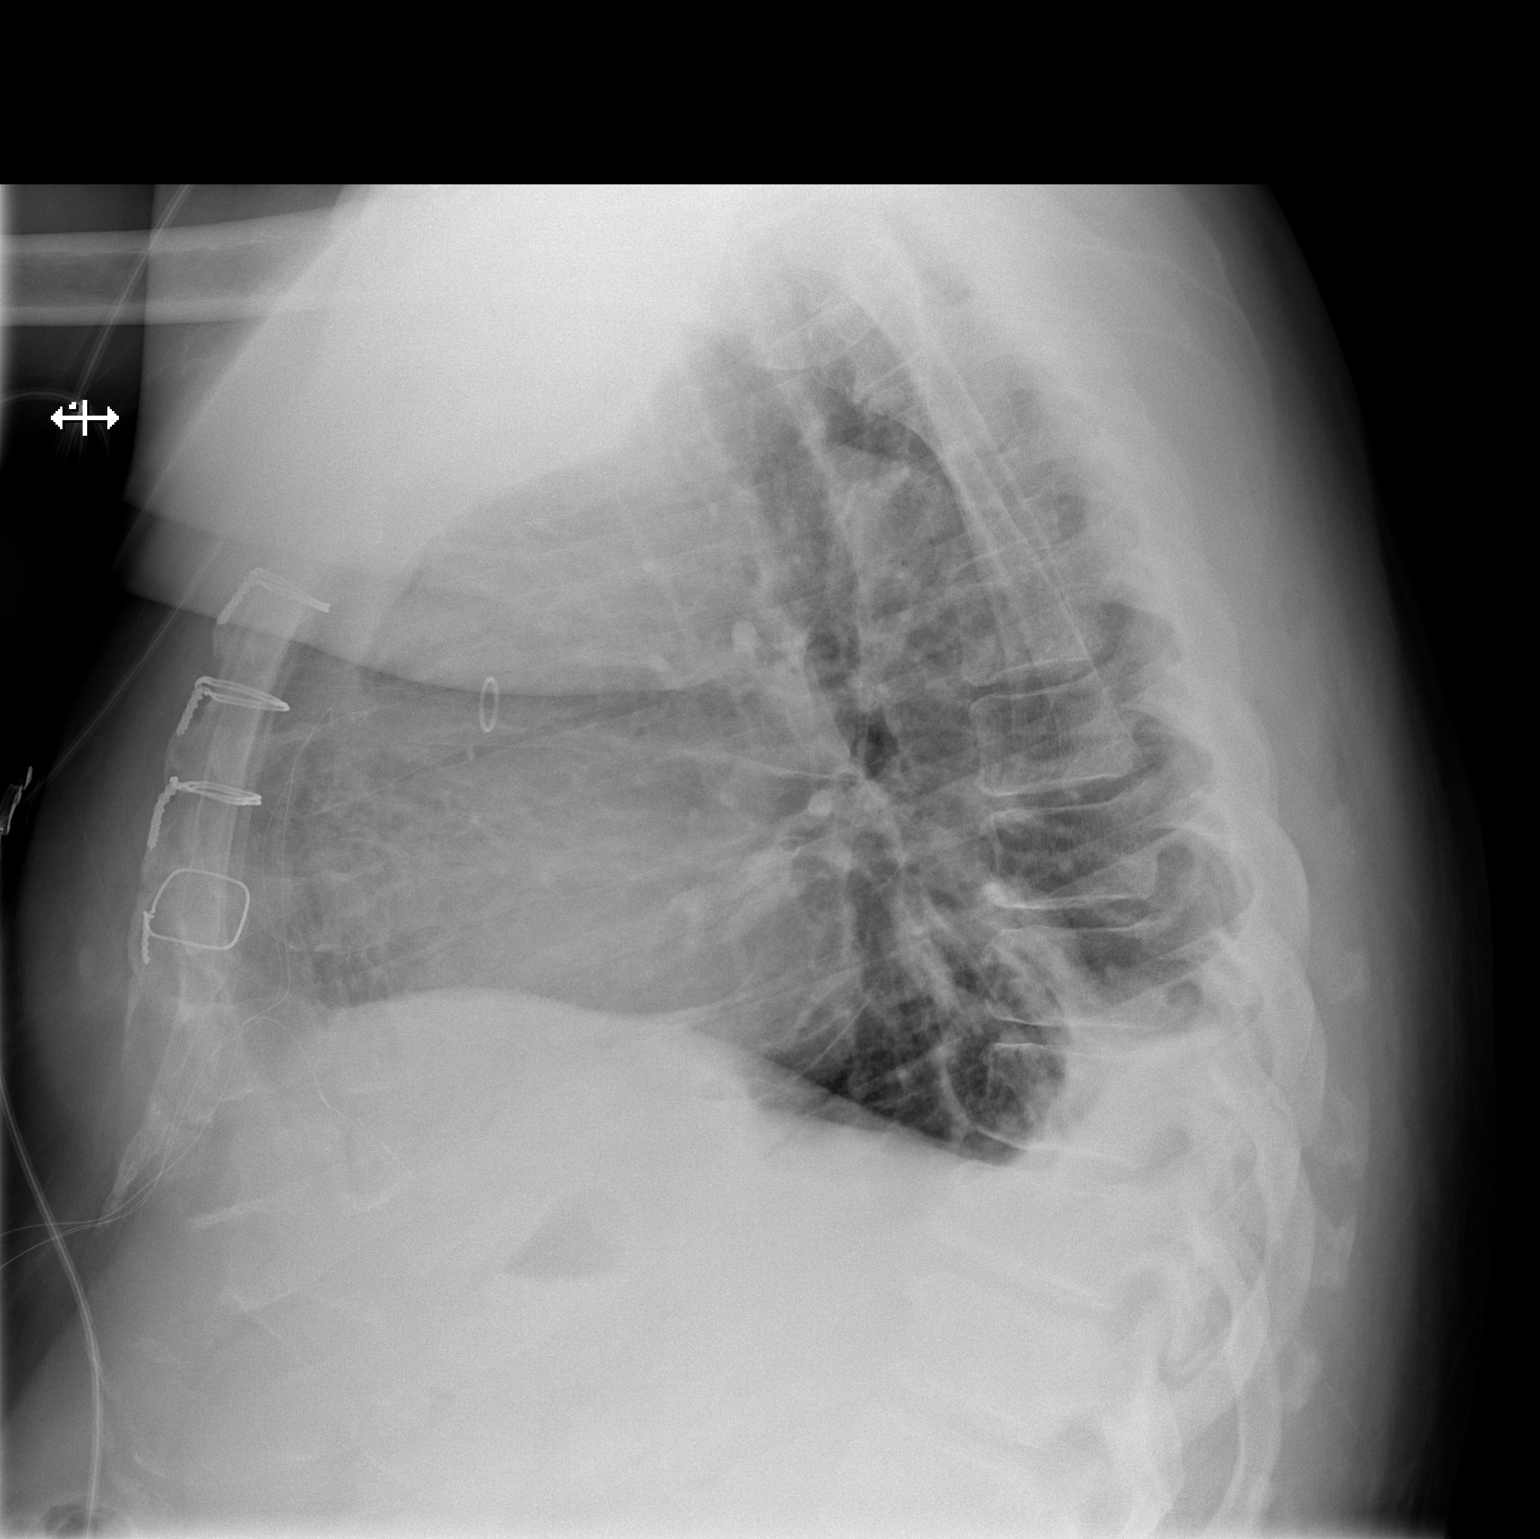

[2 of 2 positions shown; findings below may reference images not displayed]

FINDINGS: Patchy retrocardiac/left lower lobe opacity, atelectasis
versus pneumonia.  Small left pleural effusion.  No pneumothorax.

Cardiomegaly.  Prior pneumomediastinum is not well visualized on
the current study and may have resolved.  Postsurgical changes
related to prior CABG.
IMPRESSION: Left lower lobe opacity, atelectasis versus pneumonia.  Associated
small left pleural effusion.

No pneumothorax is seen.

## 2013-06-24 NOTE — Progress Notes (Signed)
[address]       [place] [address]             [phone]                                       Jonathan Miles [facility] Medical Record #[id number] Date of Birth: [birth year]  Dr  Algis Downs Anise Salvo, MD  Chief Complaint:   PostOp Follow Up Visit 05/07/2012  OPERATIVE REPORT  PREOPERATIVE DIAGNOSIS: Dilated aortic root.  POSTOPERATIVE DIAGNOSIS: Dilated aortic root.  SURGICAL PROCEDURE: Valve-sparing aortic root replacement and coronary  artery bypass grafting x1 with second reverse saphenous vein graft to  the distal right coronary artery with right thigh endo vein harvesting.    History of Present Illness:     Feels well, back at work without symptoms of heart failure.  History  Smoking status  . Former Smoker -- 1.00 packs/day for 13 years  . Types: Cigarettes  . Quit date: 01/23/1988  Smokeless tobacco  . Never Used       Allergies  Allergen Reactions  . Iodinated Diagnostic Agents Hives    Hives started 4 days after cta chest was performed, minimal relief w/ benadryl x 3 days, uncertain if reaction was actually iv contrast or other allergen,allergy tests to follow.wife will make [place] aware of results//a.calhoun    Current Outpatient Prescriptions  Medication Sig Dispense Refill  . acetaminophen (TYLENOL) 500 MG tablet TAKE 1-2 TABS EVERY 6 HOURS AS NEEDED FOR PAIN      . aspirin 325 MG tablet Take 325 mg by mouth daily.      Marland Kitchen atorvastatin (LIPITOR) 40 MG tablet TAKE 1 TABLET (40MG  TOTAL) BY MOUTH DAILY.  30 tablet  2  . carvedilol (COREG) 12.5 MG tablet Take 1 tablet (12.5 mg total) by mouth 2 (two) times daily.  180 tablet  1  . felodipine (PLENDIL) 10 MG 24 hr tablet 1 daily  90 tablet  1  . furosemide (LASIX) 40 MG tablet Take 1 tablet (40 mg total) by mouth daily.  90 tablet  1  . lisinopril (PRINIVIL,ZESTRIL) 10 MG tablet Take 1 tablet (10 mg total) by mouth daily.  90 tablet  1  . metFORMIN (GLUCOPHAGE) 500 MG tablet  Take 500 mg by mouth daily with breakfast. Additional refills should be authorized by Dr Alwyn Ren or PCP.      Marland Kitchen methocarbamol (ROBAXIN) 500 MG tablet Take 500 mg by mouth as needed. TAKE 1-2 TABLETS AT BEDTIME AS NEEDED FOR MUSCLE SPASMS      . potassium chloride SA (K-DUR,KLOR-CON) 20 MEQ tablet Take 1 tablet (20 mEq total) by mouth daily.  90 tablet  1   No current facility-administered medications for this visit.       Physical Exam: BP 146/84  Pulse 80  Resp 20  Ht 6\' 4"  (1.93 m)  Wt 320 lb (145.151 kg)  BMI 38.97 kg/m2  SpO2 96%  General appearance: alert and cooperative Neurologic: intact Heart: regular rate and rhythm, S1, S2 normal, no murmur, click, rub or gallop, normal apical impulse, no rub and no murmur of AI Lungs: clear to auscultation bilaterally and normal percussion bilaterally Abdomen: soft, non-tender; bowel sounds normal; no masses,  no organomegaly Extremities: extremities normal, atraumatic, no cyanosis or edema and Homans sign is negative, no sign of DVT Wound: sternum stable, still has  significant keloid still with some redness has thickened at lower sternum Diagnostic Studies & Laboratory data:         Recent Radiology Findings: Ct Angio Chest Aorta W/cm &/or Wo/cm  06/17/2013   *RADIOLOGY REPORT*  Clinical Data: Follow-up status post aortic root replacement  CT ANGIOGRAPHY CHEST  Technique:  Multidetector CT imaging of the chest using the standard protocol during bolus administration of intravenous contrast. Multiplanar reconstructed images including MIPs were obtained and reviewed to evaluate the vascular anatomy.  Contrast: 80mL OMNIPAQUE IOHEXOL 350 MG/ML SOLN  Comparison: Most recent prior CTA of the chest 09/25/2011; most recent prior chest x-ray 10/07/2012  Findings:  Mediastinum: Unremarkable CT appearance of the thyroid gland.  No suspicious mediastinal or hilar adenopathy.  No soft tissue mediastinal mass.  The thoracic esophagus is unremarkable.   Heart/Vascular:  Status post median sternotomy with evidence of valve sparing tube graft replacement of the ascending aorta and the aortic to RCA saphenous vein graft.  The vein graft opacifies with contrast material.  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  This is unchanged compared to prior. The aortic anastomosis is intact.  No evidence of periaortic fluid, or hematoma.  The heart is within normal limits for size.  No pericardial effusion.  Atherosclerotic vascular calcifications noted in the left anterior descending coronary artery.  Lungs/Pleura: Stable calcified granuloma in the inferior left lower lobe.  The lungs are otherwise clear.  Upper Abdomen: Scattered punctate calcifications throughout the spleen consistent with the sequela of remote granulomatous disease. Otherwise, visualization of the upper abdomen is unremarkable.  Bones: No acute fracture or aggressive appearing lytic or blastic osseous lesion.  Healed median sternotomy.  Sternotomy wires are intact.  IMPRESSION:  1. Compared to the prior chest CTA from November 2012 there are interval surgical changes of repair of the ascending aorta with a straight tube graft as well as single vessel CABG to the right coronary artery.  No evidence of complication including recurrent aneurysmal dilatation, dissection, leak or mediastinal hematoma. The visualized portion of the aortic to RCA saphenous vein graft opacifies with contrast material.  2.  Sequela of remote granulomatous disease.   Original Report Authenticated By: Malachy Moan, M.D.     Recent Labs: Lab Results  Component Value Date   WBC 7.8 05/26/2012   HGB 10.3* 05/26/2012   HCT 31.3* 05/26/2012   PLT 631.0* 05/26/2012   GLUCOSE 116* 02/09/2013   CHOL 160 10/01/2012   TRIG 64.0 10/01/2012   HDL 48.90 10/01/2012   LDLCALC 98 10/01/2012   ALT 21 10/01/2012   AST 13 10/01/2012   NA 139 02/09/2013   K 3.6 02/09/2013   CL 105 02/09/2013   CREATININE  0.80 06/09/2013   BUN 15 06/09/2013   CO2 25 02/09/2013   INR 1.54* 05/07/2012   HGBA1C 6.1* 05/05/2012   Echo reviewed from 07/2012, mild ai with intact graft and leaflets   Assessment / Plan:   Steady post op improvement after valve spearing aortic root replacemnt and Cabg x1 to RCA.  Will see back with repeat cta of chest in one year with echo  DI have discussed keloid formation with the patient, if continues to increase and bother him will refer to plastic surgery for scar revision   Delight Ovens MD 06/24/2013 12:16 PM

## 2013-07-05 ENCOUNTER — Ambulatory Visit (INDEPENDENT_AMBULATORY_CARE_PROVIDER_SITE_OTHER): Payer: BC Managed Care – PPO | Admitting: Physician Assistant

## 2013-07-05 ENCOUNTER — Encounter: Payer: Self-pay | Admitting: Physician Assistant

## 2013-07-05 VITALS — BP 130/88 | HR 74 | Ht 76.0 in | Wt 334.0 lb

## 2013-07-05 DIAGNOSIS — I5032 Chronic diastolic (congestive) heart failure: Secondary | ICD-10-CM

## 2013-07-05 DIAGNOSIS — E785 Hyperlipidemia, unspecified: Secondary | ICD-10-CM

## 2013-07-05 DIAGNOSIS — I77810 Thoracic aortic ectasia: Secondary | ICD-10-CM

## 2013-07-05 DIAGNOSIS — I2510 Atherosclerotic heart disease of native coronary artery without angina pectoris: Secondary | ICD-10-CM

## 2013-07-05 DIAGNOSIS — I509 Heart failure, unspecified: Secondary | ICD-10-CM

## 2013-07-05 DIAGNOSIS — I359 Nonrheumatic aortic valve disorder, unspecified: Secondary | ICD-10-CM

## 2013-07-05 DIAGNOSIS — I351 Nonrheumatic aortic (valve) insufficiency: Secondary | ICD-10-CM

## 2013-07-05 DIAGNOSIS — I10 Essential (primary) hypertension: Secondary | ICD-10-CM

## 2013-07-05 MED ORDER — CARVEDILOL 12.5 MG PO TABS: 12.5000 mg | ORAL_TABLET | Freq: Two times a day (BID) | ORAL | Status: DC

## 2013-07-05 MED ORDER — ATORVASTATIN CALCIUM 40 MG PO TABS: ORAL_TABLET | ORAL | Status: DC

## 2013-07-05 MED ORDER — FUROSEMIDE 40 MG PO TABS: 40.0000 mg | ORAL_TABLET | Freq: Every day | ORAL | Status: DC

## 2013-07-05 MED ORDER — LISINOPRIL 10 MG PO TABS: 10.0000 mg | ORAL_TABLET | Freq: Every day | ORAL | Status: DC

## 2013-07-05 MED ORDER — FELODIPINE ER 10 MG PO TB24: ORAL_TABLET | ORAL | Status: DC

## 2013-07-05 NOTE — Patient Instructions (Addendum)
A REFILL FOR YOUR MEDICATIONS HAS BEEN DENT TO YOUR PHARMACY  Your physician has requested that you have an echocardiogram. Echocardiography is a painless test that uses sound waves to create images of your heart. It provides your doctor with information about the size and shape of your heart and how well your heart's chambers and valves are working. This procedure takes approximately one hour. There are no restrictions for this procedure.  PLEASE SCHEDULE ECHO AFTER 07/29/13  Your physician recommends that you schedule a follow-up appointment in: 6 MONTHS WITH DR.MCLEAN 2 Gram Low Sodium Diet A 2 gram sodium diet restricts the amount of sodium in the diet to no more than 2 g or 2000 mg daily. Limiting the amount of sodium is often used to help lower blood pressure. It is important if you have heart, liver, or kidney problems. Many foods contain sodium for flavor and sometimes as a preservative. When the amount of sodium in a diet needs to be low, it is important to know what to look for when choosing foods and drinks. The following includes some information and guidelines to help make it easier for you to adapt to a low sodium diet. QUICK TIPS  Do not add salt to food.  Avoid convenience items and fast food.  Choose unsalted snack foods.  Buy lower sodium products, often labeled as "lower sodium" or "no salt added."  Check food labels to learn how much sodium is in 1 serving.  When eating at a restaurant, ask that your food be prepared with less salt or none, if possible. READING FOOD LABELS FOR SODIUM INFORMATION The nutrition facts label is a good place to find how much sodium is in foods. Look for products with no more than 500 to 600 mg of sodium per meal and no more than 150 mg per serving. Remember that 2 g = 2000 mg. The food label may also list foods as:  Sodium-free: Less than 5 mg in a serving.  Very low sodium: 35 mg or less in a serving.  Low-sodium: 140 mg or less in a  serving.  Light in sodium: 50% less sodium in a serving. For example, if a food that usually has 300 mg of sodium is changed to become light in sodium, it will have 150 mg of sodium.  Reduced sodium: 25% less sodium in a serving. For example, if a food that usually has 400 mg of sodium is changed to reduced sodium, it will have 300 mg of sodium. CHOOSING FOODS Grains  Avoid: Salted crackers and snack items. Some cereals, including instant hot cereals. Bread stuffing and biscuit mixes. Seasoned rice or pasta mixes.  Choose: Unsalted snack items. Low-sodium cereals, oats, puffed wheat and rice, shredded wheat. English muffins and bread. Pasta. Meats  Avoid: Salted, canned, smoked, spiced, pickled meats, including fish and poultry. Bacon, ham, sausage, cold cuts, hot dogs, anchovies.  Choose: Low-sodium canned tuna and salmon. Fresh or frozen meat, poultry, and fish. Dairy  Avoid: Processed cheese and spreads. Cottage cheese. Buttermilk and condensed milk. Regular cheese.  Choose: Milk. Low-sodium cottage cheese. Yogurt. Sour cream. Low-sodium cheese. Fruits and Vegetables  Avoid: Regular canned vegetables. Regular canned tomato sauce and paste. Frozen vegetables in sauces. Olives. Rosita Fire. Relishes. Sauerkraut.  Choose: Low-sodium canned vegetables. Low-sodium tomato sauce and paste. Frozen or fresh vegetables. Fresh and frozen fruit. Condiments  Avoid: Canned and packaged gravies. Worcestershire sauce. Tartar sauce. Barbecue sauce. Soy sauce. Steak sauce. Ketchup. Onion, garlic, and table salt. Meat  flavorings and tenderizers.  Choose: Fresh and dried herbs and spices. Low-sodium varieties of mustard and ketchup. Lemon juice. Tabasco sauce. Horseradish. SAMPLE 2 GRAM SODIUM MEAL PLAN Breakfast / Sodium (mg)  1 cup low-fat milk / 143 mg  2 slices whole-wheat toast / 270 mg  1 tbs heart-healthy margarine / 153 mg  1 hard-boiled egg / 139 mg  1 small orange / 0 mg Lunch /  Sodium (mg)  1 cup raw carrots / 76 mg   cup hummus / 298 mg  1 cup low-fat milk / 143 mg   cup red grapes / 2 mg  1 whole-wheat pita bread / 356 mg Dinner / Sodium (mg)  1 cup whole-wheat pasta / 2 mg  1 cup low-sodium tomato sauce / 73 mg  3 oz lean ground beef / 57 mg  1 small side salad (1 cup raw spinach leaves,  cup cucumber,  cup yellow bell pepper) with 1 tsp olive oil and 1 tsp red wine vinegar / 25 mg Snack / Sodium (mg)  1 container low-fat vanilla yogurt / 107 mg  3 graham cracker squares / 127 mg Nutrient Analysis  Calories: 2033  Protein: 77 g  Carbohydrate: 282 g  Fat: 72 g  Sodium: 1971 mg Document Released: 10/13/2005 Document Revised: 01/05/2012 Document Reviewed: 01/14/2010 [employer] Patient Information 2014 [place], [place].

## 2013-07-05 NOTE — Progress Notes (Signed)
[address] [place], [place]  [address] Phone: [phone] Fax:  [phone]  Date:  07/05/2013   ID:  Jonathan Miles, DOB 1961-09-18, MRN [id number]  PCP:  Janace Hoard, MD  Cardiologist:  Dr. [name]     History of Present Illness: Jonathan Miles is a 52 y.o. male who returns for follow up.  He has a hx of aortic root aneurysm and CAD, s/p aortic valve sparing aortic root replacement with CABG x1 (SVG-RCA) on 05/07/12, DM2, HTN, HL, diastolic CHF.  Echo in 10/13 showed EF 55-60% with mild to moderate eccentric aortic insufficiency.  Last seen in this office by Norma Fredrickson, NP 01/2013.  In the interim he has seen Dr. Tyrone Sage for f/u as well.    The patient denies chest pain, shortness of breath, syncope, orthopnea, PND or significant pedal edema.  He is NYHA Class II.  He is under a lot of stress.  Both parents are ill.  His father just had emergency surgery and is ICU at [facility].    Labs (3/12):   K 4, creatinine 1.03, LDL 113, HDL 33  Labs (4/12):   K 3.7, creatinine 0.8  Labs (1/13):   K 3.6, creatinine 0.9, LDL 133, HDL 42  Labs (7/13):   K 3.9, creatinine 0.91, Hgb 8.9  Labs (12/13): LDL 98, HDL 49  Labs (1/14):   K 3.7, creatinine 0.9, BNP 268  Labs (2/14):   K 4, creatinine 1.0, proBNP 48 Labs (4/14):   K 3.6, creatinine 0.8, proBNP 56 Labs (8/14):   Creatinine 0.8  Wt Readings from Last 3 Encounters:  07/05/13 334 lb (151.501 kg)  06/17/13 320 lb (145.151 kg)  02/09/13 325 lb (147.419 kg)    Past Medical History:  1. DM  2. Hyperlipidemia  3. HTN  4. Aortic root/ascending aortic aneurysm: 4/12 Echo with 5.4 cm aortic root; 4/12 MRA chest with 5.5 cm aortic root, 3.8 cm ascending thoracic aorta; 10/12 CTA chest with 6.1 x 5.1 cm aortic root/proximal ascending aorta, 4.2 x 4.1 cm mid ascending aorta; 1/13 Echo with 5.4 cm root, 5.5 cm proximal ascending thoracic aorta. S/p valve-sparing aortic root and ascending aorta replacement with  CABG x 1 (SVG-RCA) 04/2012 with Dr. Tyrone Sage.  5. Aortic insufficiency: Echo (1/13): EF 55%, mild LVH, 5.4 cm aortic root, 5.5 cm proximal ascending thoracic aorta. Trileaflet aortic valve with trivial AI. Echo (10/13) EF 55-60%, s/p valve-sparing aortic root replacement, eccentric mild to moderate AI, normal RV.  6. Possible contrast allergy  7. CAD: Myoview (4/12): EF 67%, no ischemia or infarction. LHC (1/13) with long 50% D2 stenosis, 60% proximal RCA, and 50% mid RCA. S/p CABG x 1 with SVG0-RCA at time of ascending aortic aneurysm repair.  8. ? Brief peri-operative atrial fibrillation in 7/13. Nausea with amiodarone.  9. Diastolic CHF   Current Outpatient Prescriptions  Medication Sig Dispense Refill  . acetaminophen (TYLENOL) 500 MG tablet TAKE 1-2 TABS EVERY 6 HOURS AS NEEDED FOR PAIN      . aspirin 81 MG tablet Take 81 mg by mouth daily.      Marland Kitchen atorvastatin (LIPITOR) 40 MG tablet TAKE 1 TABLET (40MG  TOTAL) BY MOUTH DAILY.  30 tablet  2  . carvedilol (COREG) 12.5 MG tablet Take 1 tablet (12.5 mg total) by mouth 2 (two) times daily.  180 tablet  1  . felodipine (PLENDIL) 10 MG 24 hr tablet 1 daily  90 tablet  1  . furosemide (LASIX) 40  MG tablet Take 1 tablet (40 mg total) by mouth daily.  90 tablet  1  . lisinopril (PRINIVIL,ZESTRIL) 10 MG tablet Take 1 tablet (10 mg total) by mouth daily.  90 tablet  1  . metFORMIN (GLUCOPHAGE) 500 MG tablet Take 500 mg by mouth daily with breakfast. Additional refills should be authorized by Dr Alwyn Ren or PCP.      Marland Kitchen methocarbamol (ROBAXIN) 500 MG tablet Take 500 mg by mouth as needed. TAKE 1-2 TABLETS AT BEDTIME AS NEEDED FOR MUSCLE SPASMS      . potassium chloride SA (K-DUR,KLOR-CON) 20 MEQ tablet Take 1 tablet (20 mEq total) by mouth daily.  90 tablet  1   No current facility-administered medications for this visit.    Allergies:    Allergies  Allergen Reactions  . Iodinated Diagnostic Agents Hives    Hives started 4 days after cta chest was  performed, minimal relief w/ benadryl x 3 days, uncertain if reaction was actually iv contrast or other allergen,allergy tests to follow.wife will make [place] aware of results//a.calhoun    Social History:  The patient  reports that he quit smoking about 25 years ago. His smoking use included Cigarettes. He has a 13 pack-year smoking history. He has never used smokeless tobacco. He reports that he does not drink alcohol or use illicit drugs.   ROS:  Please see the history of present illness.      All other systems reviewed and negative.   PHYSICAL EXAM: VS:  BP 130/88  Pulse 74  Ht 6\' 4"  (1.93 m)  Wt 334 lb (151.501 kg)  BMI 40.67 kg/m2 Well nourished, well developed, in no acute distress HEENT: normal Neck: no JVD Cardiac:  normal S1, S2; RRR; 1/6 diastolic murmur at 3rd LICS Lungs:  clear to auscultation bilaterally, no wheezing, rhonchi or rales Abd: soft, nontender, no hepatomegaly Ext: trace bilateral LE edema Skin: warm and dry Neuro:  CNs 2-12 intact, no focal abnormalities noted  EKG:  NSR, HR 74, normal axis, no acute changes     ASSESSMENT AND PLAN:  1. CAD:  No angina.  Continue ASA and statin.  2. Chronic Diastolic CHF:  Volume stable.  Continue current Rx. 3. Aortic Root Dilatation:  S/p AV sparing Ao root and ascending Ao replacement.  Continue follow up with TCTS. 4. Aortic Insufficiency:  Arrange follow up echo in 07/2013. 5. Hypertension:  Fair control.  We discussed limiting salt.  Continue to monitor.  6. Hyperlipidemia:  Continue statin.  7. Disposition:  F/u with Dr. [name] in 6 mos.    Signed, Tereso Newcomer, PA-C  07/05/2013 10:56 AM

## 2013-07-07 ENCOUNTER — Encounter: Payer: BC Managed Care – PPO | Admitting: Cardiothoracic Surgery

## 2013-08-08 ENCOUNTER — Ambulatory Visit (HOSPITAL_COMMUNITY): Payer: BC Managed Care – PPO | Attending: Cardiology | Admitting: Cardiology

## 2013-08-08 ENCOUNTER — Other Ambulatory Visit (HOSPITAL_COMMUNITY): Payer: Self-pay | Admitting: Cardiology

## 2013-08-08 DIAGNOSIS — I5032 Chronic diastolic (congestive) heart failure: Secondary | ICD-10-CM | POA: Insufficient documentation

## 2013-08-08 DIAGNOSIS — I351 Nonrheumatic aortic (valve) insufficiency: Secondary | ICD-10-CM

## 2013-08-08 DIAGNOSIS — I359 Nonrheumatic aortic valve disorder, unspecified: Secondary | ICD-10-CM

## 2013-08-08 DIAGNOSIS — I059 Rheumatic mitral valve disease, unspecified: Secondary | ICD-10-CM

## 2013-08-08 NOTE — Progress Notes (Signed)
Echo performed. 

## 2013-08-09 ENCOUNTER — Telehealth: Payer: Self-pay | Admitting: Cardiology

## 2013-08-09 NOTE — Telephone Encounter (Signed)
Follow up  ° ° °Pt returned RN call. °

## 2013-08-09 NOTE — Telephone Encounter (Signed)
Spoke with patient about echo results 

## 2013-09-01 ENCOUNTER — Other Ambulatory Visit: Payer: Self-pay

## 2013-10-03 ENCOUNTER — Other Ambulatory Visit: Payer: Self-pay

## 2013-10-03 DIAGNOSIS — E785 Hyperlipidemia, unspecified: Secondary | ICD-10-CM

## 2013-10-03 MED ORDER — ATORVASTATIN CALCIUM 40 MG PO TABS: ORAL_TABLET | ORAL | Status: DC

## 2013-12-06 ENCOUNTER — Telehealth: Payer: Self-pay

## 2013-12-06 NOTE — Telephone Encounter (Signed)
[facility] faxed request for Rx for compounded pain cream. Denied. Pt hasn't been eval here for pain since W/C case 04/2013.

## 2013-12-10 ENCOUNTER — Ambulatory Visit (INDEPENDENT_AMBULATORY_CARE_PROVIDER_SITE_OTHER): Payer: BC Managed Care – PPO | Admitting: Family Medicine

## 2013-12-10 VITALS — BP 120/80 | HR 75 | Temp 97.8°F | Resp 18 | Ht 75.0 in | Wt 339.1 lb

## 2013-12-10 DIAGNOSIS — E785 Hyperlipidemia, unspecified: Secondary | ICD-10-CM

## 2013-12-10 DIAGNOSIS — I10 Essential (primary) hypertension: Secondary | ICD-10-CM

## 2013-12-10 DIAGNOSIS — M1990 Unspecified osteoarthritis, unspecified site: Secondary | ICD-10-CM

## 2013-12-10 DIAGNOSIS — I2510 Atherosclerotic heart disease of native coronary artery without angina pectoris: Secondary | ICD-10-CM

## 2013-12-10 DIAGNOSIS — E1165 Type 2 diabetes mellitus with hyperglycemia: Principal | ICD-10-CM

## 2013-12-10 DIAGNOSIS — M549 Dorsalgia, unspecified: Secondary | ICD-10-CM

## 2013-12-10 DIAGNOSIS — IMO0001 Reserved for inherently not codable concepts without codable children: Secondary | ICD-10-CM

## 2013-12-10 LAB — COMPREHENSIVE METABOLIC PANEL
ALT: 25 U/L (ref 0–53)
AST: 12 U/L (ref 0–37)
Albumin: 4 g/dL (ref 3.5–5.2)
Alkaline Phosphatase: 78 U/L (ref 39–117)
BUN: 15 mg/dL (ref 6–23)
CO2: 26 mEq/L (ref 19–32)
Calcium: 9.4 mg/dL (ref 8.4–10.5)
Chloride: 103 mEq/L (ref 96–112)
Creat: 0.83 mg/dL (ref 0.50–1.35)
Glucose, Bld: 243 mg/dL — ABNORMAL HIGH (ref 70–99)
Potassium: 4.3 mEq/L (ref 3.5–5.3)
Sodium: 139 mEq/L (ref 135–145)
Total Bilirubin: 0.6 mg/dL (ref 0.2–1.2)
Total Protein: 6.7 g/dL (ref 6.0–8.3)

## 2013-12-10 LAB — POCT CBC
Granulocyte percent: 74.9 %G (ref 37–80)
HCT, POC: 44.5 % (ref 43.5–53.7)
Hemoglobin: 14.3 g/dL (ref 14.1–18.1)
Lymph, poc: 1.7 (ref 0.6–3.4)
MCH, POC: 28.8 pg (ref 27–31.2)
MCHC: 32.1 g/dL (ref 31.8–35.4)
MCV: 89.6 fL (ref 80–97)
MID (cbc): 0.5 (ref 0–0.9)
MPV: 8 fL (ref 0–99.8)
POC Granulocyte: 6.7 (ref 2–6.9)
POC LYMPH PERCENT: 19.3 %L (ref 10–50)
POC MID %: 5.8 %M (ref 0–12)
Platelet Count, POC: 294 10*3/uL (ref 142–424)
RBC: 4.97 M/uL (ref 4.69–6.13)
RDW, POC: 14.4 %
WBC: 9 10*3/uL (ref 4.6–10.2)

## 2013-12-10 LAB — LIPID PANEL
Cholesterol: 140 mg/dL (ref 0–200)
HDL: 35 mg/dL — ABNORMAL LOW (ref >39)
LDL Cholesterol: 90 mg/dL (ref 0–99)
Total CHOL/HDL Ratio: 4 Ratio
Triglycerides: 77 mg/dL (ref <150)
VLDL: 15 mg/dL (ref 0–40)

## 2013-12-10 LAB — POCT GLYCOSYLATED HEMOGLOBIN (HGB A1C): Hemoglobin A1C: 10

## 2013-12-10 LAB — GLUCOSE, POCT (MANUAL RESULT ENTRY): POC Glucose: 268 mg/dl — AB (ref 70–99)

## 2013-12-10 MED ORDER — DICLOFENAC SODIUM 3 % TD GEL: TRANSDERMAL | Status: DC

## 2013-12-10 MED ORDER — TRAMADOL HCL 50 MG PO TABS: 50.0000 mg | ORAL_TABLET | Freq: Three times a day (TID) | ORAL | Status: DC | PRN

## 2013-12-10 MED ORDER — METFORMIN HCL 500 MG PO TABS: ORAL_TABLET | ORAL | Status: DC

## 2013-12-10 NOTE — Patient Instructions (Signed)
Take metformin 500 mg twice daily for one week, then increase to 1000 mg twice daily  Use the tramadol if needed for pain  Use the diclofenac gel (Voltaren) 2-3 times daily rubbed into the knee about 1 inch worth of gel  Continue your other medications  Return in about 2 months for recheck  Work hard on lifestyle changes necessary  Return sooner if problems

## 2013-12-10 NOTE — Progress Notes (Signed)
Subjective: Last time I saw the patient a long time ago he was having some left arm pain. I referred her to cardiology. He ended up having an aortic root aneurysm as well as coronary artery disease. He had surgery and he is doing well. He is back at work. 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 He was on a medication that made him lose a lot of weight, and when they got him back off of it and he started getting an appetite back he has a too much and gained back his highest weight ever. Complaining of low back pain and knee pains. The insurance company told him to get a compounded product prescribed. I refused to do that without seeing him, so he came on in today. His diabetes has not been in good control, and he is out of his metformin. Cardiovascular unremarkable. Respiratory unremarkable. GI and GU unremarkable.  Objective: Obese gentleman in no acute distress. Chest clear. Heart regular without murmurs. Large keloid from his thoracotomy. Abdomen soft without mass or tenderness. Lots of low back pain in the paraspinous muscles when he bends down. DJD of the knees.  Assessment: Diabetes, uncontrolled Hypertension Coronary artery disease Hyperlipidemia Keloid on chest wall Obesity and weight gain Low back pain Continue the  Plan: Is not supposed to take NSAID agents orally, but I will give him some diclofenac gel to use on his knees. Check labs and represcribed his diabetic medications. Blood pressure looks good today.  Results for orders placed in visit on 12/10/13  POCT CBC      Result Value Ref Range   WBC 9.0  4.6 - 10.2 K/uL   Lymph, poc 1.7  0.6 - 3.4   POC LYMPH PERCENT 19.3  10 - 50 %L   MID (cbc) 0.5  0 - 0.9   POC MID % 5.8  0 - 12 %M   POC Granulocyte 6.7  2 - 6.9   Granulocyte percent 74.9  37 - 80 %G   RBC 4.97  4.69 - 6.13 M/uL   Hemoglobin 14.3  14.1 - 18.1 g/dL   HCT, POC 44.5  43.5 - 53.7 %   MCV 89.6  80 - 97 fL   MCH, POC 28.8  27 -  31.2 pg   MCHC 32.1  31.8 - 35.4 g/dL   RDW, POC 14.4     Platelet Count, POC 294  142 - 424 K/uL   MPV 8.0  0 - 99.8 fL  POCT GLYCOSYLATED HEMOGLOBIN (HGB A1C)      Result Value Ref Range   Hemoglobin A1C 10.0    GLUCOSE, POCT (MANUAL RESULT ENTRY)      Result Value Ref Range   POC Glucose 268 (*) 70 - 99 mg/dl   Treating the diabetes and losing weight will be his key items. Return in 2 months. See instructions.

## 2013-12-12 ENCOUNTER — Encounter: Payer: Self-pay | Admitting: Family Medicine

## 2014-06-22 ENCOUNTER — Ambulatory Visit: Payer: BC Managed Care – PPO | Admitting: Cardiothoracic Surgery

## 2014-07-13 ENCOUNTER — Other Ambulatory Visit: Payer: Self-pay | Admitting: Family Medicine

## 2014-07-13 ENCOUNTER — Other Ambulatory Visit: Payer: Self-pay | Admitting: Physician Assistant

## 2014-07-15 NOTE — Telephone Encounter (Signed)
Tell patient he needs to be seen before I can refill things.

## 2014-07-20 ENCOUNTER — Other Ambulatory Visit: Payer: Self-pay | Admitting: Family Medicine

## 2014-08-11 ENCOUNTER — Other Ambulatory Visit: Payer: Self-pay

## 2014-10-04 ENCOUNTER — Ambulatory Visit (INDEPENDENT_AMBULATORY_CARE_PROVIDER_SITE_OTHER): Payer: BC Managed Care – PPO | Admitting: Family Medicine

## 2014-10-04 VITALS — BP 132/84 | HR 77 | Temp 98.4°F | Resp 17 | Ht 75.0 in | Wt 332.0 lb

## 2014-10-04 DIAGNOSIS — E1310 Other specified diabetes mellitus with ketoacidosis without coma: Secondary | ICD-10-CM

## 2014-10-04 DIAGNOSIS — R059 Cough, unspecified: Secondary | ICD-10-CM

## 2014-10-04 DIAGNOSIS — J029 Acute pharyngitis, unspecified: Secondary | ICD-10-CM

## 2014-10-04 DIAGNOSIS — E1110 Type 2 diabetes mellitus with ketoacidosis without coma: Secondary | ICD-10-CM

## 2014-10-04 DIAGNOSIS — R05 Cough: Secondary | ICD-10-CM

## 2014-10-04 LAB — POCT GLYCOSYLATED HEMOGLOBIN (HGB A1C): Hemoglobin A1C: 6.9

## 2014-10-04 LAB — POCT RAPID STREP A (OFFICE): Rapid Strep A Screen: NEGATIVE

## 2014-10-04 LAB — GLUCOSE, POCT (MANUAL RESULT ENTRY): POC Glucose: 179 mg/dl — AB (ref 70–99)

## 2014-10-04 MED ORDER — METFORMIN HCL 500 MG PO TABS: 1000.0000 mg | ORAL_TABLET | Freq: Two times a day (BID) | ORAL | Status: DC

## 2014-10-04 MED ORDER — BENZONATATE 100 MG PO CAPS: 100.0000 mg | ORAL_CAPSULE | Freq: Three times a day (TID) | ORAL | Status: DC | PRN

## 2014-10-04 MED ORDER — AZITHROMYCIN 250 MG PO TABS: ORAL_TABLET | ORAL | Status: DC

## 2014-10-04 NOTE — Progress Notes (Signed)
   Subjective: Patient is here complaining of a sore throat for the last couple days. He's had some head congestion and cough and some purulent phlegm. He feels lousy. He has bad headache. His sugars have been running fair. His weight stays about the same. He needs a physical sometime.  Objective: He did eat some crackers earlier so that will affect his sugar. His TMs are normal. Throat erythematous without exudate. Strep test taken. No nodes. Chest clear. Heart regular without murmurs.  Assessment: Pharyngitis URI  Plan: Strep screen, glucose, A1c  Results for orders placed or performed in visit on 10/04/14  POCT glucose (manual entry)  Result Value Ref Range   POC Glucose 179 (A) 70 - 99 mg/dl  POCT glycosylated hemoglobin (Hb A1C)  Result Value Ref Range   Hemoglobin A1C 6.9   POCT rapid strep A  Result Value Ref Range   Rapid Strep A Screen Negative Negative   He is to work hard on his weight. Represcribed his diabetic medicine. He needs a physical sometime in February or March.  This is probably a viral respiratory tract infection. However to keep him from having to wait again. I gave him a prescription for azithromycin, M.D. if he is not better by about Saturday he will get it filled. Is to destroy it if not used.

## 2014-10-04 NOTE — Patient Instructions (Signed)
Drink plenty of fluids and get enough rest  Plan to stay off work through tomorrow  Take over-the-counter cough syrup such as Robitussin-DM sugar-free if needed for cough. It  In the daytime when you return to work you'll find the benzonatate 1 or 2 pills 3 times daily to help keep down some of the coughing without sedating you. Cough syrup and this can overlap.  If you are not improving by Saturday, or if you're getting worse at anytime before then, go ahead and get the azithromycin prescription filled and take 2 pills initially, then 1 daily for 4 days. If you do not use this prescription over the next 10 days please destroy the prescription  Continue to work hard on weight loss.  Plan to return in February or March for a physical exam again.

## 2014-10-06 LAB — CULTURE, GROUP A STREP: Organism ID, Bacteria: NORMAL

## 2014-10-16 ENCOUNTER — Other Ambulatory Visit: Payer: Self-pay | Admitting: Cardiology

## 2014-10-26 ENCOUNTER — Other Ambulatory Visit: Payer: Self-pay | Admitting: Cardiology

## 2014-11-27 ENCOUNTER — Other Ambulatory Visit: Payer: Self-pay

## 2014-11-27 MED ORDER — FELODIPINE ER 10 MG PO TB24: ORAL_TABLET | ORAL | Status: DC

## 2014-11-27 MED ORDER — LISINOPRIL 10 MG PO TABS: ORAL_TABLET | ORAL | Status: DC

## 2014-11-27 MED ORDER — CARVEDILOL 12.5 MG PO TABS: ORAL_TABLET | ORAL | Status: DC

## 2014-12-04 ENCOUNTER — Ambulatory Visit (INDEPENDENT_AMBULATORY_CARE_PROVIDER_SITE_OTHER): Payer: BLUE CROSS/BLUE SHIELD | Admitting: Physician Assistant

## 2014-12-04 ENCOUNTER — Encounter: Payer: Self-pay | Admitting: Physician Assistant

## 2014-12-04 VITALS — BP 150/82 | HR 70 | Ht 75.0 in | Wt 340.0 lb

## 2014-12-04 DIAGNOSIS — E785 Hyperlipidemia, unspecified: Secondary | ICD-10-CM

## 2014-12-04 DIAGNOSIS — I77810 Thoracic aortic ectasia: Secondary | ICD-10-CM

## 2014-12-04 DIAGNOSIS — I2510 Atherosclerotic heart disease of native coronary artery without angina pectoris: Secondary | ICD-10-CM

## 2014-12-04 DIAGNOSIS — R42 Dizziness and giddiness: Secondary | ICD-10-CM

## 2014-12-04 DIAGNOSIS — I5032 Chronic diastolic (congestive) heart failure: Secondary | ICD-10-CM

## 2014-12-04 DIAGNOSIS — I351 Nonrheumatic aortic (valve) insufficiency: Secondary | ICD-10-CM

## 2014-12-04 DIAGNOSIS — I10 Essential (primary) hypertension: Secondary | ICD-10-CM

## 2014-12-04 MED ORDER — CARVEDILOL 12.5 MG PO TABS
ORAL_TABLET | ORAL | Status: DC
Start: 2014-12-04 — End: 2015-07-12

## 2014-12-04 MED ORDER — FUROSEMIDE 40 MG PO TABS
ORAL_TABLET | ORAL | Status: DC
Start: 2014-12-04 — End: 2015-07-12

## 2014-12-04 MED ORDER — LISINOPRIL 20 MG PO TABS: 20.0000 mg | ORAL_TABLET | Freq: Every day | ORAL | Status: DC

## 2014-12-04 MED ORDER — ATORVASTATIN CALCIUM 40 MG PO TABS: ORAL_TABLET | ORAL | Status: DC

## 2014-12-04 MED ORDER — FELODIPINE ER 10 MG PO TB24: ORAL_TABLET | ORAL | Status: DC

## 2014-12-04 NOTE — Progress Notes (Signed)
Cardiology Office Note   Date:  12/04/2014   ID:  Jonathan Miles, DOB [birth year], MRN [id number]  PCP:  Ruben Reason, MD  Cardiologist:  Dr. Loralie Champagne     Chief Complaint  Patient presents with  . Coronary Artery Disease    Follow up  . Congestive Heart Failure    Follow up  . Cardiac Valve Problem    Aortic Insufficiency F/U     History of Present Illness: Jonathan Miles is a 54 y.o. male with a hx of aortic root aneurysm and CAD, s/p aortic valve sparing aortic root replacement with CABG x1 (SVG-RCA) on 05/07/12, DM2, HTN, HL, diastolic CHF. Echo in 10/13 showed EF 55-60% with mild to moderate eccentric aortic insufficiency.  Last seen 06/2013.  FU Echo 07/2013 demonstrated normal EF, aortic root 41 mm and mild AI.    Since last seen, he has been doing well.  He has had some dizziness recently.  This seems to be a spinning sensation.  He has had 2 episodes.  He has had some dizziness with standing quickly as well. He denies syncope.  He denies chest pain. He notes chronic DOE.  He is NYHA 2-2b.  He sleeps on an incline.  Denies PND. He is unable to tolerate CPAP.  He denies LE edema.       Studies/Reports Reviewed Today:  Echocardiogram 07/2013 - Left ventricle: Mild LVH.  EF 60%. Wall motion was normal. Findings c/w diastolic dysfunction. - Aortic valve: There was no stenosis. Mild regurgitation. - Aorta: Mild dilitation of aortic root. No dilitation of ascending aorta. Aortic root 60mm.Ascending aorta 11mm - Left atrium: The atrium was mildly to moderately dilated. - Right ventricle: The cavity size was mildly dilated.  Systolic function was mildly reduced. - Right atrium: The atrium was mildly dilated. - Pulmonary arteries: PA peak pressure: 86mm Hg (S).    Past Medical History:  1. DM  2. Hyperlipidemia  3. HTN  4. Aortic root/ascending aortic aneurysm: 4/12 Echo with 5.4 cm aortic root; 4/12 MRA chest with 5.5 cm aortic root, 3.8 cm ascending thoracic  aorta; 10/12 CTA chest with 6.1 x 5.1 cm aortic root/proximal ascending aorta, 4.2 x 4.1 cm mid ascending aorta; 1/13 Echo with 5.4 cm root, 5.5 cm proximal ascending thoracic aorta. S/p valve-sparing aortic root and ascending aorta replacement with CABG x 1 (SVG-RCA) 04/2012 with Dr. Servando Snare.  5. Aortic insufficiency: Echo (1/13): EF 55%, mild LVH, 5.4 cm aortic root, 5.5 cm proximal ascending thoracic aorta. Trileaflet aortic valve with trivial AI. Echo (10/13) EF 55-60%, s/p valve-sparing aortic root replacement, eccentric mild to moderate AI, normal RV.  6. Possible contrast allergy  7. CAD: Myoview (4/12): EF 67%, no ischemia or infarction. LHC (1/13) with long 50% D2 stenosis, 60% proximal RCA, and 50% mid RCA. S/p CABG x 1 with SVG0-RCA at time of ascending aortic aneurysm repair.  8. ? Brief peri-operative atrial fibrillation in 7/13. Nausea with amiodarone.  9. Diastolic CHF Past Medical History  Diagnosis Date  . Hypertension   . Hyperlipidemia   . Diabetes mellitus     type 2  . Chest pain     Myoview 4/12: EF 62%, no ischemia or scar  . Aortic root dilatation     a. echo 4/12: EF 55-60%, mod to marked dilated ascending aortic root;   b. MRA 4/12: Aortic root 5.5 cm, dilation of left and right coronary cusps, trileaflet aortic valve  . PONV (postoperative nausea and vomiting)   .  GERD (gastroesophageal reflux disease)     modifies with diet  . Arthritis   . Asthma     ast attack in early 20's  . OSA (obstructive sleep apnea)     .  Wears at times    Past Surgical History  Procedure Laterality Date  . Knee arthroscopy  2010    left  . Retactment of fingers      as a child , sewed with sewing machine- Index and middle finger  . Colonscopy    . Upper gastrointestinal endoscopy    . Coronary artery bypass graft  05/07/2012    Procedure: CORONARY ARTERY BYPASS GRAFTING (CABG);  Surgeon: Grace Isaac, MD;  Location: [facility];  Service: Open Heart Surgery;  Laterality: N/A;      Current Outpatient Prescriptions  Medication Sig Dispense Refill  . acetaminophen (TYLENOL) 500 MG tablet TAKE 1-2 TABS EVERY 6 HOURS AS NEEDED FOR PAIN    . aspirin 81 MG tablet Take 81 mg by mouth daily.    Marland Kitchen atorvastatin (LIPITOR) 40 MG tablet TAKE 1 TABLET (40MG  TOTAL) BY MOUTH DAILY. 30 tablet 10  . carvedilol (COREG) 12.5 MG tablet TAKE 1 TABLET (12.5 MG TOTAL) BY MOUTH 2 (TWO) TIMES DAILY. 60 tablet 0  . Diclofenac Sodium 3 % GEL Rub about 1 inch of gel into each knee 2-3 times daily 100 g 3  . felodipine (PLENDIL) 10 MG 24 hr tablet TAKE ONE TABLET BY MOUTH DAILY 30 tablet 0  . furosemide (LASIX) 40 MG tablet TAKE 1 TABLET (40 MG TOTAL) BY MOUTH DAILY. APPOINTMENT NEEDED FOR FUTURE REFILLS 30 tablet 0  . lisinopril (PRINIVIL,ZESTRIL) 10 MG tablet TAKE 1 TABLET (10 MG TOTAL) BY MOUTH DAILY. 30 tablet 0  . loratadine (CLARITIN) 10 MG tablet Take 10 mg by mouth daily.    . metFORMIN (GLUCOPHAGE) 500 MG tablet Take 2 tablets (1,000 mg total) by mouth 2 (two) times daily with a meal. 180 tablet 3  . traMADol (ULTRAM) 50 MG tablet Take 1 tablet (50 mg total) by mouth every 8 (eight) hours as needed. 30 tablet 2   No current facility-administered medications for this visit.    Allergies:   Iodinated diagnostic agents    Social History:  The patient  reports that he quit smoking about 26 years ago. His smoking use included Cigarettes. He has a 13 pack-year smoking history. He has never used smokeless tobacco. He reports that he does not drink alcohol or use illicit drugs.   Family History:  The patient's He was adopted. Family history is unknown by patient.    ROS:  Please see the history of present illness.   Otherwise, review of systems are positive for nausea and vomiting last week - this has resolved.   All other systems are reviewed and negative.    PHYSICAL EXAM: VS:  BP 150/82 mmHg  Pulse 70  Ht 6\' 3"  (1.905 m)  Wt 340 lb (154.223 kg)  BMI 42.50 kg/m2      Orthostatic VS for the past 24 hrs:  BP- Lying Pulse- Lying BP- Sitting Pulse- Sitting BP- Standing at 0 minutes Pulse- Standing at 0 minutes  12/04/14 1646 (!) 147/92 mmHg 71 140/90 mmHg 71 151/89 mmHg 72    Wt Readings from Last 3 Encounters:  12/04/14 340 lb (154.223 kg)  10/04/14 332 lb (150.594 kg)  12/10/13 339 lb 2 oz (153.826 kg)     GEN: Well nourished, well developed, in no acute distress  HEENT: normal Neck: I cannot appreciate JVD, no masses Cardiac:  Normal S1/S2, RRR; no murmur, no rubs or gallops, trace bilateral LE edema  Respiratory:  clear to auscultation bilaterally, no wheezing, rhonchi or rales. GI: soft, nontender, nondistended, + BS MS: no deformity or atrophy Skin: warm and dry  Neuro:  CNs II-XII intact, Strength and sensation are intact Psych: Normal affect   EKG:  EKG is ordered today.  It demonstrates:   NSR, HR 70, normal axis, no change from prior tracing.    Recent Labs: 12/10/2013: ALT 25; BUN 15; Creatinine 0.83; Hemoglobin 14.3; Potassium 4.3; Sodium 139    Lipid Panel    Component Value Date/Time   CHOL 140 12/10/2013 1212   TRIG 77 12/10/2013 1212   HDL 35* 12/10/2013 1212   CHOLHDL 4.0 12/10/2013 1212   VLDL 15 12/10/2013 1212   LDLCALC 90 12/10/2013 1212      ASSESSMENT AND PLAN:  1.  CAD: No angina. Continue ASA, beta blocker and statin. 2.  Chronic Diastolic CHF: Volume appears stable. Continue current Rx.  Check BMET. 3.  Aortic Root Dilatation: S/p AV sparing Ao root and ascending Ao replacement. Continue follow up with TCTS. 4.  Aortic Insufficiency: Arrange FU Echo.   5.  Hypertension: BP above target.  He has noted some dizziness recently that sounds vertiginous.  However, it sometimes occurs with standing.    -  Check orthostatic VS >> these were normal.    -  Increase Lisinopril to 20 mg QD.    -  Check BMET 1 week. 6.  Hyperlipidemia: Continue statin.  Arrange fasting Lipids. 7.  Dizziness:  This may be  BPPV.  FU with primary care.   Current medicines are reviewed at length with the patient today.  The patient does not have concerns regarding medicines.  The following changes have been made:  As above.  Labs/ tests ordered today include:   Orders Placed This Encounter  Procedures  . Basic Metabolic Panel (BMET)  . CBC w/Diff  . Hepatic function panel  . Lipid Profile  . Basic Metabolic Panel (BMET)  . EKG 12-Lead  . 2D Echocardiogram without contrast     Disposition:   FU with Dr. Loralie Champagne in 6 months.   Signed, Versie Starks, MHS 12/04/2014 4:19 PM    [facility] [place], [place], [place]  [address] Phone: [phone]; Fax: [phone]

## 2014-12-04 NOTE — Patient Instructions (Signed)
LAB WORK TODAY, BMET, CBC W/DIFF, LFT  LAB WORK IN 1 WEEK BMET, [place]  Your physician has requested that you have an echocardiogram. Echocardiography is a painless test that uses sound waves to create images of your heart. It provides your doctor with information about the size and shape of your heart and how well your heart's chambers and valves are working. This procedure takes approximately one hour. There are no restrictions for this procedure.  Your physician has recommended you make the following change in your medication:  1. INCREASE LISINOPRIL 20 MG DAILY, NEW RX SENT IN 2. REFILLS SENT IN FOR COREG, LASIX, PLENDIL, LIPITOR  Your physician wants you to follow-up in: [place]. Aundra Dubin.  You will receive a reminder letter in the mail two months in advance. If you don't receive a letter, please call our office to schedule the follow-up appointment.

## 2014-12-05 LAB — HEPATIC FUNCTION PANEL
ALT: 16 U/L (ref 0–53)
AST: 9 U/L (ref 0–37)
Albumin: 4.3 g/dL (ref 3.5–5.2)
Alkaline Phosphatase: 68 U/L (ref 39–117)
Bilirubin, Direct: 0 mg/dL (ref 0.0–0.3)
Total Bilirubin: 0.4 mg/dL (ref 0.2–1.2)
Total Protein: 7.3 g/dL (ref 6.0–8.3)

## 2014-12-05 LAB — CBC WITH DIFFERENTIAL/PLATELET
Basophils Absolute: 0.1 10*3/uL (ref 0.0–0.1)
Basophils Relative: 0.7 % (ref 0.0–3.0)
Eosinophils Absolute: 0.2 10*3/uL (ref 0.0–0.7)
Eosinophils Relative: 2 % (ref 0.0–5.0)
HCT: 42.7 % (ref 39.0–52.0)
Hemoglobin: 14.4 g/dL (ref 13.0–17.0)
Lymphocytes Relative: 14.2 % (ref 12.0–46.0)
Lymphs Abs: 1.6 10*3/uL (ref 0.7–4.0)
MCHC: 33.7 g/dL (ref 30.0–36.0)
MCV: 84.2 fl (ref 78.0–100.0)
Monocytes Absolute: 0.8 10*3/uL (ref 0.1–1.0)
Monocytes Relative: 6.8 % (ref 3.0–12.0)
Neutro Abs: 8.6 10*3/uL — ABNORMAL HIGH (ref 1.4–7.7)
Neutrophils Relative %: 76.3 % (ref 43.0–77.0)
Platelets: 324 10*3/uL (ref 150.0–400.0)
RBC: 5.08 Mil/uL (ref 4.22–5.81)
RDW: 14.8 % (ref 11.5–15.5)
WBC: 11.3 10*3/uL — ABNORMAL HIGH (ref 4.0–10.5)

## 2014-12-05 LAB — BASIC METABOLIC PANEL
BUN: 18 mg/dL (ref 6–23)
CO2: 29 mEq/L (ref 19–32)
Calcium: 10 mg/dL (ref 8.4–10.5)
Chloride: 104 mEq/L (ref 96–112)
Creatinine, Ser: 1 mg/dL (ref 0.40–1.50)
GFR: 100.06 mL/min (ref >60.00)
Glucose, Bld: 89 mg/dL (ref 70–99)
Potassium: 4.3 mEq/L (ref 3.5–5.1)
Sodium: 140 mEq/L (ref 135–145)

## 2014-12-11 ENCOUNTER — Other Ambulatory Visit: Payer: Self-pay

## 2014-12-11 ENCOUNTER — Ambulatory Visit (HOSPITAL_COMMUNITY): Payer: BLUE CROSS/BLUE SHIELD

## 2014-12-11 ENCOUNTER — Ambulatory Visit (HOSPITAL_COMMUNITY): Payer: BLUE CROSS/BLUE SHIELD | Attending: Physician Assistant

## 2014-12-11 ENCOUNTER — Encounter: Payer: Self-pay | Admitting: Physician Assistant

## 2014-12-11 DIAGNOSIS — I351 Nonrheumatic aortic (valve) insufficiency: Secondary | ICD-10-CM | POA: Diagnosis not present

## 2014-12-11 NOTE — Progress Notes (Signed)
2D Echo completed. 12/11/2014

## 2014-12-15 ENCOUNTER — Other Ambulatory Visit (INDEPENDENT_AMBULATORY_CARE_PROVIDER_SITE_OTHER): Payer: BLUE CROSS/BLUE SHIELD | Admitting: *Deleted

## 2014-12-15 DIAGNOSIS — E785 Hyperlipidemia, unspecified: Secondary | ICD-10-CM

## 2014-12-15 DIAGNOSIS — I10 Essential (primary) hypertension: Secondary | ICD-10-CM

## 2014-12-15 LAB — LIPID PANEL
Cholesterol: 145 mg/dL (ref 0–200)
HDL: 32.5 mg/dL — ABNORMAL LOW (ref >39.00)
LDL Cholesterol: 95 mg/dL (ref 0–99)
NonHDL: 112.5
Total CHOL/HDL Ratio: 4
Triglycerides: 89 mg/dL (ref 0.0–149.0)
VLDL: 17.8 mg/dL (ref 0.0–40.0)

## 2014-12-15 LAB — BASIC METABOLIC PANEL
BUN: 16 mg/dL (ref 6–23)
CO2: 30 mEq/L (ref 19–32)
Calcium: 9.4 mg/dL (ref 8.4–10.5)
Chloride: 104 mEq/L (ref 96–112)
Creatinine, Ser: 0.91 mg/dL (ref 0.40–1.50)
GFR: 111.56 mL/min (ref >60.00)
Glucose, Bld: 97 mg/dL (ref 70–99)
Potassium: 3.5 mEq/L (ref 3.5–5.1)
Sodium: 141 mEq/L (ref 135–145)

## 2014-12-18 ENCOUNTER — Telehealth: Payer: Self-pay | Admitting: *Deleted

## 2014-12-18 DIAGNOSIS — E785 Hyperlipidemia, unspecified: Secondary | ICD-10-CM

## 2014-12-18 DIAGNOSIS — I10 Essential (primary) hypertension: Secondary | ICD-10-CM

## 2014-12-18 NOTE — Telephone Encounter (Signed)
pt notified about lab results. Pt will increase dietary K+. States he is taking lipitor 40 mg daily, though does not want to increase to 80 mg due to myalgia's that he has already d/w Dr. Aundra Dubin. He said he will focus more on his diet. BMET 01/01/15. Pt states he has not heard about echo results yet. I explained waiting for Dr. Aundra Dubin to review echo images per Richardson Dopp, PA.

## 2014-12-19 NOTE — Addendum Note (Signed)
Addended by: Michae Kava on: 12/19/2014 02:28 PM   Modules accepted: Orders

## 2014-12-19 NOTE — Telephone Encounter (Signed)
pt notified per Brynda Rim. PA if not increasing lipitor to 80 mg then to work on diet. Repeat LFT/FLP 06/18/15. Pt aware PA waiting for input from Dr. Aundra Dubin on echo, will notify pt once read by Dr. Aundra Dubin. Pt said ok and thank you.

## 2014-12-25 ENCOUNTER — Telehealth: Payer: Self-pay | Admitting: Cardiology

## 2014-12-25 NOTE — Telephone Encounter (Signed)
I will forward to Dr Aundra Dubin for review and recommendations for antibiotics prior to dental cleaning and possible extraction.

## 2014-12-25 NOTE — Telephone Encounter (Signed)
New Msg     1. What dental office are you calling from? Pt calling  2. What is your office phone and fax number? Pt not sure   3. What type of procedure is the patient having performed? Cleaning and possible extraction  4. What date is procedure scheduled? 12/29/14  5. What is your question (ex. Antibiotics prior to procedure, holding medication-we need to know how long dentist wants pt to hold med)?  Pt needs to know if he should take an antibiotic?   Please return pt call and advise.

## 2014-12-28 MED ORDER — AMOXICILLIN 500 MG PO CAPS: ORAL_CAPSULE | ORAL | Status: DC

## 2014-12-28 NOTE — Telephone Encounter (Signed)
Jonathan Miles should take amoxicillin 2 g prior to dental cleaning.

## 2015-01-01 ENCOUNTER — Telehealth: Payer: Self-pay | Admitting: *Deleted

## 2015-01-01 ENCOUNTER — Other Ambulatory Visit (INDEPENDENT_AMBULATORY_CARE_PROVIDER_SITE_OTHER): Payer: BLUE CROSS/BLUE SHIELD | Admitting: *Deleted

## 2015-01-01 DIAGNOSIS — I10 Essential (primary) hypertension: Secondary | ICD-10-CM

## 2015-01-01 LAB — BASIC METABOLIC PANEL
BUN: 8 mg/dL (ref 6–23)
CO2: 30 mEq/L (ref 19–32)
Calcium: 9.2 mg/dL (ref 8.4–10.5)
Chloride: 105 mEq/L (ref 96–112)
Creatinine, Ser: 0.85 mg/dL (ref 0.40–1.50)
GFR: 120.67 mL/min (ref >60.00)
Glucose, Bld: 143 mg/dL — ABNORMAL HIGH (ref 70–99)
Potassium: 3.7 mEq/L (ref 3.5–5.1)
Sodium: 140 mEq/L (ref 135–145)

## 2015-01-01 NOTE — Addendum Note (Signed)
Addended by: Eulis Foster on: 01/01/2015 07:53 AM   Modules accepted: Orders

## 2015-01-01 NOTE — Telephone Encounter (Signed)
pt notified of lab results with verbal understanding 

## 2015-01-05 ENCOUNTER — Telehealth: Payer: Self-pay | Admitting: *Deleted

## 2015-01-05 DIAGNOSIS — I351 Nonrheumatic aortic (valve) insufficiency: Secondary | ICD-10-CM

## 2015-01-05 DIAGNOSIS — I77810 Thoracic aortic ectasia: Secondary | ICD-10-CM

## 2015-01-05 NOTE — Telephone Encounter (Signed)
lmptcb Dr. Aundra Dubin has now read echo and we were calling with echo results

## 2015-01-15 ENCOUNTER — Telehealth: Payer: Self-pay | Admitting: Physician Assistant

## 2015-01-15 NOTE — Telephone Encounter (Signed)
New message ° ° °Patient returning call back to nurse.  °

## 2015-01-15 NOTE — Telephone Encounter (Signed)
s/w pt's wife about echo results per Brynda Rim. PA and Dr. Aundra Dubin. repeat echo in 1 yr. Wife said thank you and she will pass this on to pt.

## 2015-02-06 ENCOUNTER — Other Ambulatory Visit: Payer: Self-pay | Admitting: Family Medicine

## 2015-02-06 NOTE — Telephone Encounter (Signed)
Call: Sent in a refill but needs to return before future refills

## 2015-02-06 NOTE — Telephone Encounter (Signed)
Dr Linna Darner, you saw pt in Dec, but don't see this med discussed since 11/2013. Do you want to deny it and have pt come in, or give RF?

## 2015-02-06 NOTE — Telephone Encounter (Signed)
Needs to return before future refills.

## 2015-02-07 NOTE — Telephone Encounter (Signed)
Called in RF and notified pt. He agreed to RTC bc needs CPE anyway. Gave him Dr Hopper's hours end of month.

## 2015-04-21 ENCOUNTER — Other Ambulatory Visit: Payer: Self-pay | Admitting: Family Medicine

## 2015-04-23 ENCOUNTER — Other Ambulatory Visit: Payer: Self-pay

## 2015-04-25 ENCOUNTER — Other Ambulatory Visit: Payer: Self-pay

## 2015-04-25 MED ORDER — METFORMIN HCL 500 MG PO TABS: ORAL_TABLET | ORAL | Status: DC

## 2015-06-18 ENCOUNTER — Telehealth: Payer: Self-pay | Admitting: *Deleted

## 2015-06-18 ENCOUNTER — Other Ambulatory Visit (INDEPENDENT_AMBULATORY_CARE_PROVIDER_SITE_OTHER): Payer: BLUE CROSS/BLUE SHIELD | Admitting: *Deleted

## 2015-06-18 DIAGNOSIS — E785 Hyperlipidemia, unspecified: Secondary | ICD-10-CM

## 2015-06-18 LAB — LIPID PANEL
Cholesterol: 138 mg/dL (ref 0–200)
HDL: 40.1 mg/dL (ref >39.00)
LDL Cholesterol: 87 mg/dL (ref 0–99)
NonHDL: 98.35
Total CHOL/HDL Ratio: 3
Triglycerides: 55 mg/dL (ref 0.0–149.0)
VLDL: 11 mg/dL (ref 0.0–40.0)

## 2015-06-18 LAB — HEPATIC FUNCTION PANEL
ALT: 15 U/L (ref 0–53)
AST: 9 U/L (ref 0–37)
Albumin: 4 g/dL (ref 3.5–5.2)
Alkaline Phosphatase: 57 U/L (ref 39–117)
Bilirubin, Direct: 0.1 mg/dL (ref 0.0–0.3)
Total Bilirubin: 0.4 mg/dL (ref 0.2–1.2)
Total Protein: 6.7 g/dL (ref 6.0–8.3)

## 2015-06-18 NOTE — Telephone Encounter (Signed)
lmom lab work looks good. If any questions cb [phone].

## 2015-07-05 ENCOUNTER — Other Ambulatory Visit: Payer: Self-pay | Admitting: Cardiology

## 2015-07-06 ENCOUNTER — Ambulatory Visit (INDEPENDENT_AMBULATORY_CARE_PROVIDER_SITE_OTHER): Payer: BLUE CROSS/BLUE SHIELD | Admitting: Family Medicine

## 2015-07-06 VITALS — BP 132/80 | HR 71 | Temp 98.4°F | Resp 17 | Ht 75.0 in | Wt 337.0 lb

## 2015-07-06 DIAGNOSIS — N400 Enlarged prostate without lower urinary tract symptoms: Secondary | ICD-10-CM | POA: Diagnosis not present

## 2015-07-06 DIAGNOSIS — M170 Bilateral primary osteoarthritis of knee: Secondary | ICD-10-CM | POA: Diagnosis not present

## 2015-07-06 DIAGNOSIS — E119 Type 2 diabetes mellitus without complications: Secondary | ICD-10-CM

## 2015-07-06 DIAGNOSIS — R351 Nocturia: Secondary | ICD-10-CM | POA: Diagnosis not present

## 2015-07-06 LAB — POCT GLYCOSYLATED HEMOGLOBIN (HGB A1C): Hemoglobin A1C: 6.4

## 2015-07-06 MED ORDER — TAMSULOSIN HCL 0.4 MG PO CAPS: 0.4000 mg | ORAL_CAPSULE | Freq: Every day | ORAL | Status: DC

## 2015-07-06 MED ORDER — METFORMIN HCL 500 MG PO TABS: ORAL_TABLET | ORAL | Status: DC

## 2015-07-06 MED ORDER — TRAMADOL HCL 50 MG PO TABS: 50.0000 mg | ORAL_TABLET | Freq: Three times a day (TID) | ORAL | Status: DC | PRN

## 2015-07-06 NOTE — Progress Notes (Signed)
Diabetes check an arthritis Subjective:  Patient ID: Jonathan Miles, male    DOB: [birth year]  Age: 54 y.o. MRN: [id number]  Patient has had problems with his knees giving him more pain. He had arthroscopy of the left knee 5 or 6 years ago. It has been giving him more problems. Both knees bother him when he is on the feet a lot or if he is having to bend them more. He takes some Tylenol on a when necessary basis for them, 1-3 pills a day. He is diabetic and his sugars have continued to run well. His weight has been down and backup, some family stresses added to that. He has seen his cardiologist and is doing satisfactorily. He has to get up to urinate more times in the night now. No headaches or dizziness. Sees the eye doctor periodically.   Objective:   Overweight male, alert and oriented. Chest clear. Heart regular without murmurs or arrhythmias. Digital rectal exam reveals prostate gland B moderately large, nontender, no nodules, normal texture The joints do not have major crepitance. He is able to walk but he asked likely hurts some. The right wrist is tender. No joint effusions.  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 Talk about using some tamsulosin for the urinary difficulties. Talk about some of the options.  Check labs today and decide on medicines and refills   Results for orders placed or performed in visit on 07/06/15  POCT glycosylated hemoglobin (Hb A1C)  Result Value Ref Range   Hemoglobin A1C 6.4    .    There are no Patient Instructions on file for this visit.   Jonathan Miles,[place], MD 07/06/2015

## 2015-07-06 NOTE — Patient Instructions (Signed)
Continue the same metformin dose  Although your diabetes is in good control, I would still urge you to work harder try to lose weight. That would help. Knees also.  Take Tylenol for pain, but if having worse pain take the tramadol on an as-needed basis.  Take the Flomax (tamsulosin) 0.4 mg one pill at supper. Be cautious especially the first few nights when you get up in the night to make sure you do not get lightheaded with it. We used see your cardiologist let them know that you are taking that also. Check your blood pressure a few times over the next couple of weeks to make sure that it is not running too low. Some people get a lowering of the blood pressure on this medication.  Plan to return in about 6 months, sooner if needed  Get your flu shot at the job as planned

## 2015-07-07 LAB — BASIC METABOLIC PANEL
BUN: 12 mg/dL (ref 7–25)
CO2: 24 mmol/L (ref 20–31)
Calcium: 9.4 mg/dL (ref 8.6–10.3)
Chloride: 101 mmol/L (ref 98–110)
Creat: 0.78 mg/dL (ref 0.70–1.33)
Glucose, Bld: 125 mg/dL — ABNORMAL HIGH (ref 65–99)
Potassium: 4.2 mmol/L (ref 3.5–5.3)
Sodium: 142 mmol/L (ref 135–146)

## 2015-07-07 LAB — PSA: PSA: 3.97 ng/mL (ref <=4.00)

## 2015-07-11 NOTE — Progress Notes (Signed)
Cardiology Office Note   Date:  07/12/2015   ID:  Jonathan Miles, DOB [birth year], MRN [id number]  PCP:  Ruben Reason, MD  Cardiologist:  Dr. Loralie Champagne     Chief Complaint  Patient presents with  . Coronary Artery Disease  . Congestive Heart Failure  . Aortic Root Dilatation s/p repair     History of Present Illness: Jonathan Miles is a 54 y.o. male with a hx of aortic root aneurysm and CAD, s/p aortic valve sparing aortic root replacement with CABG x1 (SVG-RCA) on 05/07/12, DM2, HTN, HL, diastolic CHF. Echo in 10/13 showed EF 55-60% with mild to moderate eccentric aortic insufficiency. FU Echo 07/2013 demonstrated normal EF, aortic root 41 mm and mild AI.  Last seen 2/16. Follow-up echo demonstrated EF 55-60%, moderate LVH, mild to moderate AI, moderate LAE, mild RAE. Aortic root measured at 38 mm and ascending aorta 43 mm. I reviewed this with Dr. Aundra Dubin. He also reviewed the echocardiogram and felt that the measurements were essentially unchanged when compared to prior echocardiogram. Follow-up echo plan in 1 year.  Patient returns for follow-up. Here today with his wife. Overall doing well. Denies significant dyspnea and exertion. He is NYHA 2-2b. He recently started on Flomax for BPH. Over the past week, he has noted orthopnea as well as coughing and wheezing at night when he lays down. He denies any significant increase LE edema. He has had some dizziness from the Flomax. He does have some pain around his keloid scar. He denies exertional chest discomfort. He denies syncope.     Studies/Reports Reviewed Today:  Echo 12/11/14 Moderate LVH, EF 55-60%, mild to moderate AI directed eccentrically in the LVOT and towards the mitral anterior leaflet, aortic root 38 mm, ascending aorta 43 mm, moderate LAE, mild RAE  Echocardiogram 07/2013 - Left ventricle: Mild LVH.  EF 60%. Wall motion was normal. Findings c/w diastolic dysfunction. - Aortic valve: There was no stenosis. Mild  regurgitation. - Aorta: Mild dilitation of aortic root. No dilitation of ascending aorta. Aortic root 74mm.Ascending aorta 51mm - Left atrium: The atrium was mildly to moderately dilated. - Right ventricle: The cavity size was mildly dilated.  Systolic function was mildly reduced. - Right atrium: The atrium was mildly dilated. - Pulmonary arteries: PA peak pressure: 28mm Hg (S).    Past Medical History:  1. DM  2. Hyperlipidemia  3. HTN  4. Aortic root/ascending aortic aneurysm: 4/12 Echo with 5.4 cm aortic root; 4/12 MRA chest with 5.5 cm aortic root, 3.8 cm ascending thoracic aorta; 10/12 CTA chest with 6.1 x 5.1 cm aortic root/proximal ascending aorta, 4.2 x 4.1 cm mid ascending aorta; 1/13 Echo with 5.4 cm root, 5.5 cm proximal ascending thoracic aorta. S/p valve-sparing aortic root and ascending aorta replacement with CABG x 1 (SVG-RCA) 04/2012 with Dr. Servando Snare.  5. Aortic insufficiency: Echo (1/13): EF 55%, mild LVH, 5.4 cm aortic root, 5.5 cm proximal ascending thoracic aorta. Trileaflet aortic valve with trivial AI. Echo (10/13) EF 55-60%, s/p valve-sparing aortic root replacement, eccentric mild to moderate AI, normal RV.  6. Possible contrast allergy  7. CAD: Myoview (4/12): EF 67%, no ischemia or infarction. LHC (1/13) with long 50% D2 stenosis, 60% proximal RCA, and 50% mid RCA. S/p CABG x 1 with SVG0-RCA at time of ascending aortic aneurysm repair.  8. ? Brief peri-operative atrial fibrillation in 7/13. Nausea with amiodarone.  9. Diastolic CHF Past Medical History  Diagnosis Date  . Hypertension   . Hyperlipidemia   .  Diabetes mellitus     type 2  . Chest pain     Myoview 4/12: EF 62%, no ischemia or scar  . Aortic root dilatation     a. echo 4/12: EF 55-60%, mod to marked dilated ascending aortic root;   b. MRA 4/12: Aortic root 5.5 cm, dilation of left and right coronary cusps, trileaflet aortic valve  . PONV (postoperative nausea and vomiting)   . GERD  (gastroesophageal reflux disease)     modifies with diet  . Arthritis   . Asthma     ast attack in early 20's  . OSA (obstructive sleep apnea)     .  Wears at times  . Hx of echocardiogram     Echo (2/16):  Mod LVH, EF 55-60%, mild to mod AI, mod LAE, mild RAE, Aortic root 38 mm, Asc aorta 43 mm    Past Surgical History  Procedure Laterality Date  . Knee arthroscopy  2010    left  . Retactment of fingers      as a child , sewed with sewing machine- Index and middle finger  . Colonscopy    . Upper gastrointestinal endoscopy    . Coronary artery bypass graft  05/07/2012    Procedure: CORONARY ARTERY BYPASS GRAFTING (CABG);  Surgeon: Grace Isaac, MD;  Location: [facility];  Service: Open Heart Surgery;  Laterality: N/A;     Current Outpatient Prescriptions  Medication Sig Dispense Refill  . acetaminophen (TYLENOL) 500 MG tablet TAKE 1-2 TABS EVERY 6 HOURS AS NEEDED FOR PAIN    . aspirin 81 MG tablet Take 81 mg by mouth daily.    Marland Kitchen atorvastatin (LIPITOR) 40 MG tablet TAKE 1 TABLET (40MG  TOTAL) BY MOUTH DAILY. 90 tablet 3  . carvedilol (COREG) 12.5 MG tablet TAKE 1 TABLET (12.5 MG TOTAL) BY MOUTH 2 (TWO) TIMES DAILY. 180 tablet 3  . Diclofenac Sodium 3 % GEL Rub about 1 inch of gel into each knee 2-3 times daily 100 g 3  . felodipine (PLENDIL) 10 MG 24 hr tablet TAKE ONE TABLET BY MOUTH DAILY 90 tablet 3  . furosemide (LASIX) 40 MG tablet TAKE 1 TABLET (40 MG TOTAL) BY MOUTH DAILY. 90 tablet 3  . lisinopril (PRINIVIL,ZESTRIL) 20 MG tablet Take 1 tablet (20 mg total) by mouth daily. 90 tablet 3  . loratadine (CLARITIN) 10 MG tablet Take 10 mg by mouth daily.    . metFORMIN (GLUCOPHAGE) 500 MG tablet Take 2 tablets by mouth twice daily. 120 tablet 12  . tamsulosin (FLOMAX) 0.4 MG CAPS capsule Take 1 capsule (0.4 mg total) by mouth daily after supper. 30 capsule 5  . traMADol (ULTRAM) 50 MG tablet Take 1 tablet (50 mg total) by mouth every 8 (eight) hours as needed. 60 tablet 1   No  current facility-administered medications for this visit.    Allergies:   Iodinated diagnostic agents    Social History:  The patient  reports that he quit smoking about 27 years ago. His smoking use included Cigarettes. He has a 13 pack-year smoking history. He has never used smokeless tobacco. He reports that he does not drink alcohol or use illicit drugs.   Family History:  The patient's He was adopted. Family history is unknown by patient.    ROS:   Please see the history of present illness.    Review of Systems  Constitution: Positive for malaise/fatigue.  Respiratory: Positive for snoring.   Genitourinary: Positive for incomplete emptying.  All  other systems reviewed and are negative.     PHYSICAL EXAM: VS:  BP 150/80 mmHg  Pulse 78  Ht 6\' 4"  (1.93 m)  Wt 337 lb 1.9 oz (152.917 kg)  BMI 41.05 kg/m2     Wt Readings from Last 3 Encounters:  07/12/15 337 lb 1.9 oz (152.917 kg)  07/06/15 337 lb (152.862 kg)  12/04/14 340 lb (154.223 kg)     GEN: Well nourished, well developed, in no acute distress HEENT: normal Neck: JVP 7-8 cm, no masses Cardiac:  Normal S1/S2, RRR; no murmur, no rubs or gallops, trace bilateral LE edema  Respiratory:  clear to auscultation bilaterally, no wheezing, rhonchi or rales. GI: soft, nontender, nondistended, + BS MS: no deformity or atrophy Skin: warm and dry  Neuro:  CNs II-XII intact, Strength and sensation are intact Psych: Normal affect   EKG:  EKG is ordered today.  It demonstrates:   NSR, HR 78, normal axis, nonspecific ST-T wave changes, QTc 462 ms, no change from prior tracing   Recent Labs: 12/04/2014: Hemoglobin 14.4; Platelets 324.0 06/18/2015: ALT 15 07/06/2015: BUN 12; Creat 0.78; Potassium 4.2; Sodium 142    Lipid Panel    Component Value Date/Time   CHOL 138 06/18/2015 0804   TRIG 55.0 06/18/2015 0804   HDL 40.10 06/18/2015 0804   CHOLHDL 3 06/18/2015 0804   VLDL 11.0 06/18/2015 0804   LDLCALC 87 06/18/2015 0804        ASSESSMENT AND PLAN:  1.  CAD: No angina. Continue ASA, beta blocker, ACE inhibitor and statin. 2.  Chronic Diastolic CHF: He has recently noticed orthopnea and coughing and wheezing at night when he lays down. He appears to be at least slightly volume overloaded. I have asked him to increase his Lasix to 40 mg twice a day 3 days. I will check a BNP. If his BNP is significantly elevated, I will continue him on a higher dose of Lasix for longer..    3.  Aortic Root Dilatation: S/p AV sparing Ao root and ascending Ao replacement. He has not seen Dr. Servando Snare since 2014. I will make sure he has follow-up arranged.. 4.  Aortic Insufficiency:  FU Echo in 2/16 with mild to mod AI and stable Ao root and Asc aorta size.     5.  Hypertension: Blood pressure elevated. He has not taken medications yet today. As noted, I am increasing his diuresis for 3 days. He will continue to monitor his blood pressures at home and let me know if his readings are running high. Consider increasing beta blocker dose if needed. 6.  Hyperlipidemia: Continue statin.  LDL 87 in 8/16. 7.  OSA:  He is not adherent to CPAP.  He cannot tolerate the mask.  I have offered a referral to Dr. Fransico Him.  He would like to think about this for now.     Medication Adjustments: Current medicines are reviewed at length with the patient today.  Concerns are outlined above. The following changes have been made:  Discontinued Medications   No medications on file   New Prescriptions   No medications on file   Modified Medications   Modified Medication Previous Medication   ATORVASTATIN (LIPITOR) 40 MG TABLET atorvastatin (LIPITOR) 40 MG tablet      TAKE 1 TABLET (40MG  TOTAL) BY MOUTH DAILY.    TAKE 1 TABLET (40MG  TOTAL) BY MOUTH DAILY.   CARVEDILOL (COREG) 12.5 MG TABLET carvedilol (COREG) 12.5 MG tablet      TAKE  1 TABLET (12.5 MG TOTAL) BY MOUTH 2 (TWO) TIMES DAILY.    TAKE 1 TABLET (12.5 MG TOTAL) BY MOUTH 2 (TWO)  TIMES DAILY.   FELODIPINE (PLENDIL) 10 MG 24 HR TABLET felodipine (PLENDIL) 10 MG 24 hr tablet      TAKE ONE TABLET BY MOUTH DAILY    TAKE ONE TABLET BY MOUTH DAILY   FUROSEMIDE (LASIX) 40 MG TABLET furosemide (LASIX) 40 MG tablet      TAKE 1 TABLET (40 MG TOTAL) BY MOUTH DAILY.    TAKE 1 TABLET (40 MG TOTAL) BY MOUTH DAILY.   LISINOPRIL (PRINIVIL,ZESTRIL) 20 MG TABLET lisinopril (PRINIVIL,ZESTRIL) 20 MG tablet      Take 1 tablet (20 mg total) by mouth daily.    Take 1 tablet (20 mg total) by mouth daily.   Labs/ tests ordered today include:   Orders Placed This Encounter  Procedures  . B Nat Peptide  . EKG 12-Lead     Disposition:    FU with Dr. Loralie Champagne 6 months    Signed, Versie Starks, MHS 07/12/2015 9:49 AM    [facility] [address], [place], [place]  [address] Phone: [phone]; Fax: [phone]

## 2015-07-12 ENCOUNTER — Other Ambulatory Visit: Payer: Self-pay | Admitting: *Deleted

## 2015-07-12 ENCOUNTER — Ambulatory Visit (INDEPENDENT_AMBULATORY_CARE_PROVIDER_SITE_OTHER): Payer: BLUE CROSS/BLUE SHIELD | Admitting: Physician Assistant

## 2015-07-12 ENCOUNTER — Encounter: Payer: Self-pay | Admitting: Physician Assistant

## 2015-07-12 VITALS — BP 150/80 | HR 78 | Ht 76.0 in | Wt 337.1 lb

## 2015-07-12 DIAGNOSIS — I5032 Chronic diastolic (congestive) heart failure: Secondary | ICD-10-CM

## 2015-07-12 DIAGNOSIS — I2510 Atherosclerotic heart disease of native coronary artery without angina pectoris: Secondary | ICD-10-CM

## 2015-07-12 DIAGNOSIS — E785 Hyperlipidemia, unspecified: Secondary | ICD-10-CM

## 2015-07-12 DIAGNOSIS — I10 Essential (primary) hypertension: Secondary | ICD-10-CM

## 2015-07-12 DIAGNOSIS — I77810 Thoracic aortic ectasia: Secondary | ICD-10-CM

## 2015-07-12 DIAGNOSIS — I351 Nonrheumatic aortic (valve) insufficiency: Secondary | ICD-10-CM

## 2015-07-12 LAB — BRAIN NATRIURETIC PEPTIDE: Pro B Natriuretic peptide (BNP): 54 pg/mL (ref 0.0–100.0)

## 2015-07-12 MED ORDER — ATORVASTATIN CALCIUM 40 MG PO TABS: ORAL_TABLET | ORAL | Status: DC

## 2015-07-12 MED ORDER — LISINOPRIL 20 MG PO TABS: 20.0000 mg | ORAL_TABLET | Freq: Every day | ORAL | Status: DC

## 2015-07-12 MED ORDER — FELODIPINE ER 10 MG PO TB24: ORAL_TABLET | ORAL | Status: DC

## 2015-07-12 MED ORDER — FUROSEMIDE 40 MG PO TABS: ORAL_TABLET | ORAL | Status: DC

## 2015-07-12 MED ORDER — CARVEDILOL 12.5 MG PO TABS: ORAL_TABLET | ORAL | Status: DC

## 2015-07-12 NOTE — Patient Instructions (Signed)
Medication Instructions:  1. INCREASE LASIX 40 MG TWICE DAILY FOR 3 DAYS THEN RESUME LASIX 40 MG DAILY  2. REFILLS SENT IN FOR LIPITOR, COREG, LASIX, PLENDIL, LISINOPRIL   Labwork: TODAY BNP  Testing/Procedures: NONE  Follow-Up: Your physician wants you to follow-up in: [place] DR. Aundra Dubin You will receive a reminder letter in the mail two months in advance. If you don't receive a letter, please call our office to schedule the follow-up appointment.   Any Other Special Instructions Will Be Listed Below (If Applicable). 1. YOU WILL NEED TO CALL DR. Everrett Coombe OFFICE TO SCHEDULE A FOLLOW UP SINCE YOUR LAST VISIT WAS IN 2014  2. CALL IF YOUR BP IS 140/90 OR ABOVE; CHECK BP 2-3 TIMES A WEEK OVER THE NEXT 2 WEEKS AND CALL WITH READINGS [phone]

## 2015-07-13 ENCOUNTER — Telehealth: Payer: Self-pay | Admitting: *Deleted

## 2015-07-13 NOTE — Telephone Encounter (Signed)
DPR on file. S/w wife about lab results and recommendations. Wife verbalized understanding by phone to plan of care.

## 2015-09-27 ENCOUNTER — Ambulatory Visit (INDEPENDENT_AMBULATORY_CARE_PROVIDER_SITE_OTHER): Payer: BLUE CROSS/BLUE SHIELD | Admitting: Family Medicine

## 2015-09-27 VITALS — BP 138/86 | HR 74 | Temp 98.9°F | Resp 18 | Ht 76.0 in | Wt 331.8 lb

## 2015-09-27 DIAGNOSIS — J209 Acute bronchitis, unspecified: Secondary | ICD-10-CM

## 2015-09-27 DIAGNOSIS — R059 Cough, unspecified: Secondary | ICD-10-CM

## 2015-09-27 DIAGNOSIS — J019 Acute sinusitis, unspecified: Secondary | ICD-10-CM | POA: Diagnosis not present

## 2015-09-27 DIAGNOSIS — R05 Cough: Secondary | ICD-10-CM | POA: Diagnosis not present

## 2015-09-27 LAB — POCT CBC
Granulocyte percent: 82.7 %G — AB (ref 37–80)
HCT, POC: 42.7 % — AB (ref 43.5–53.7)
Hemoglobin: 14.6 g/dL (ref 14.1–18.1)
Lymph, poc: 1.9 (ref 0.6–3.4)
MCH, POC: 28.8 pg (ref 27–31.2)
MCHC: 34.2 g/dL (ref 31.8–35.4)
MCV: 84.1 fL (ref 80–97)
MID (cbc): 0.7 (ref 0–0.9)
MPV: 6 fL (ref 0–99.8)
POC Granulocyte: 12.2 — AB (ref 2–6.9)
POC LYMPH PERCENT: 12.9 %L (ref 10–50)
POC MID %: 4.4 %M (ref 0–12)
Platelet Count, POC: 346 10*3/uL (ref 142–424)
RBC: 5.07 M/uL (ref 4.69–6.13)
RDW, POC: 14.1 %
WBC: 14.8 10*3/uL — AB (ref 4.6–10.2)

## 2015-09-27 MED ORDER — HYDROCODONE-HOMATROPINE 5-1.5 MG/5ML PO SYRP: 5.0000 mL | ORAL_SOLUTION | ORAL | Status: DC | PRN

## 2015-09-27 MED ORDER — BENZONATATE 100 MG PO CAPS: 100.0000 mg | ORAL_CAPSULE | Freq: Three times a day (TID) | ORAL | Status: DC | PRN

## 2015-09-27 MED ORDER — AMOXICILLIN-POT CLAVULANATE 875-125 MG PO TABS: 1.0000 | ORAL_TABLET | Freq: Two times a day (BID) | ORAL | Status: DC

## 2015-09-27 NOTE — Patient Instructions (Signed)
Drink plenty of fluids and get enough rest  If too sick tomorrow stay off of work  Take the Augmentin (amoxicillin/clavulanate) one pill twice daily at breakfast and supper  Take the Hycodan cough syrup 1 teaspoon every 4-6 hours as needed for cough  Take the benzonatate 1 or 2 tablets 3 times daily if needed for daytime cough  Return if not improving  If necessary take some guaifenesin (Mucinex) also to try and thin secretions.

## 2015-09-27 NOTE — Progress Notes (Signed)
Patient ID: Jonathan Miles, male    DOB: 1961/09/19  Age: 54 y.o. MRN: [id number]  Chief Complaint  Patient presents with  . Sinusitis    sinus headache since Monday night   . Cough    since Monday. Thick yellow phlegm.     Subjective:   Patient comes in with history of having been sick for about 4 days with a headache, sinus drainage, and cough. He does not smoke. He is been blowing out and coughing up purulent-looking phlegm. He has aching from the coughing but not major body aches otherwise. No documented fever though he has had some chills.  He has had a couple of surgeries on his heart at [facility], and was concerned about the notification regarding a rare mycobacterium infection and people who have been on the blood machine. No infections been reported at [facility], though there've been some nationwide.  Current allergies, medications, problem list, past/family and social histories reviewed.  Objective:  BP 138/86 mmHg  Pulse 74  Temp(Src) 98.9 F (37.2 C) (Oral)  Resp 18  Ht 6\' 4"  (1.93 m)  Wt 331 lb 12.8 oz (150.503 kg)  BMI 40.40 kg/m2  SpO2 97%  Looks like he feels poorly. His TMs are normal. Throat has mild erythema. Nose congested. Chest clear. Heart regular without murmur.  Assessment & Plan:   Assessment: 1. Acute bronchitis, unspecified organism   2. Cough   3. Acute sinusitis, recurrence not specified, unspecified location       Plan: Check CBC Results for orders placed or performed in visit on 09/27/15  POCT CBC  Result Value Ref Range   WBC 14.8 (A) 4.6 - 10.2 K/uL   Lymph, poc 1.9 0.6 - 3.4   POC LYMPH PERCENT 12.9 10 - 50 %L   MID (cbc) 0.7 0 - 0.9   POC MID % 4.4 0 - 12 %M   POC Granulocyte 12.2 (A) 2 - 6.9   Granulocyte percent 82.7 (A) 37 - 80 %G   RBC 5.07 4.69 - 6.13 M/uL   Hemoglobin 14.6 14.1 - 18.1 g/dL   HCT, POC 42.7 (A) 43.5 - 53.7 %   MCV 84.1 80 - 97 fL   MCH, POC 28.8 27 - 31.2 pg   MCHC 34.2 31.8 - 35.4 g/dL   RDW, POC 14.1 %    Platelet Count, POC 346 142 - 424 K/uL   MPV 6.0 0 - 99.8 fL   Will treat for sinusitis/bronchitis type infection with white count elevated will put him on antibiotic. Orders Placed This Encounter  Procedures  . POCT CBC    Meds ordered this encounter  Medications  . amoxicillin-clavulanate (AUGMENTIN) 875-125 MG tablet    Sig: Take 1 tablet by mouth 2 (two) times daily.    Dispense:  20 tablet    Refill:  0  . HYDROcodone-homatropine (HYCODAN) 5-1.5 MG/5ML syrup    Sig: Take 5 mLs by mouth every 4 (four) hours as needed.    Dispense:  120 mL    Refill:  0  . benzonatate (TESSALON) 100 MG capsule    Sig: Take 1-2 capsules (100-200 mg total) by mouth 3 (three) times daily as needed.    Dispense:  30 capsule    Refill:  0         Patient Instructions  Drink plenty of fluids and get enough rest  If too sick tomorrow stay off of work  Take the Augmentin (amoxicillin/clavulanate) one pill twice daily at breakfast  and supper  Take the Hycodan cough syrup 1 teaspoon every 4-6 hours as needed for cough  Take the benzonatate 1 or 2 tablets 3 times daily if needed for daytime cough  Return if not improving  If necessary take some guaifenesin (Mucinex) also to try and thin secretions.       Return if symptoms worsen or fail to improve.   HOPPER,Kaisei, MD 09/27/2015

## 2015-09-29 ENCOUNTER — Telehealth: Payer: Self-pay

## 2015-09-29 DIAGNOSIS — R05 Cough: Secondary | ICD-10-CM

## 2015-09-29 DIAGNOSIS — R059 Cough, unspecified: Secondary | ICD-10-CM

## 2015-09-29 NOTE — Telephone Encounter (Signed)
Patients spouse is calling because she states patient is suffering from a severe headache. She would like to know what patient should take. Please advise!

## 2015-10-01 NOTE — Telephone Encounter (Signed)
Patient can take otc ibuprofen if he has no kidney disease or Tylenol if he has no liver disease. He can alternate between those 2 medications for a headache. Hydration is also important, patient should drink at least 64 ounces of water daily. Otherwise, rtc for recheck.

## 2015-10-01 NOTE — Telephone Encounter (Signed)
Left message for pt to call back  °

## 2015-10-01 NOTE — Telephone Encounter (Signed)
Pt was seen by Dr. Linna Darner on 12/1 and diagnosed with Bronchitis. Please advise on how to trat headache.

## 2015-10-03 MED ORDER — HYDROCODONE-HOMATROPINE 5-1.5 MG/5ML PO SYRP: 5.0000 mL | ORAL_SOLUTION | ORAL | Status: DC | PRN

## 2015-10-03 NOTE — Telephone Encounter (Signed)
Spoke with pt, he is still coughing and would like a refill on his cough medication. Hydrocodone syrup.

## 2015-10-03 NOTE — Addendum Note (Signed)
Addended by: Ezekiel Slocumb on: 10/03/2015 12:01 PM   Modules accepted: Orders

## 2015-10-03 NOTE — Telephone Encounter (Signed)
Spoke with pt. He is still coughing although it is better than it was last week. He is having headaches and his upper abdomen is starting to hurt from coughing. He is still taking tessalon and antibiotic. No sob or fevers. He needs refill of cough syrup. Refilled. Wife Jonathan Miles is going to come pick it up today. I let pt know if he is not starting to turn a corner in 2 to 3 days or if sx worsen at any time, return to clinic.

## 2015-10-06 ENCOUNTER — Ambulatory Visit (INDEPENDENT_AMBULATORY_CARE_PROVIDER_SITE_OTHER): Payer: BLUE CROSS/BLUE SHIELD

## 2015-10-06 ENCOUNTER — Ambulatory Visit (INDEPENDENT_AMBULATORY_CARE_PROVIDER_SITE_OTHER): Payer: BLUE CROSS/BLUE SHIELD | Admitting: Internal Medicine

## 2015-10-06 VITALS — BP 140/82 | HR 82 | Temp 99.0°F | Resp 18 | Wt 322.2 lb

## 2015-10-06 DIAGNOSIS — R05 Cough: Secondary | ICD-10-CM

## 2015-10-06 DIAGNOSIS — R0981 Nasal congestion: Secondary | ICD-10-CM

## 2015-10-06 DIAGNOSIS — R6883 Chills (without fever): Secondary | ICD-10-CM | POA: Diagnosis not present

## 2015-10-06 DIAGNOSIS — R630 Anorexia: Secondary | ICD-10-CM

## 2015-10-06 DIAGNOSIS — R062 Wheezing: Secondary | ICD-10-CM | POA: Diagnosis not present

## 2015-10-06 DIAGNOSIS — R5383 Other fatigue: Secondary | ICD-10-CM

## 2015-10-06 DIAGNOSIS — R059 Cough, unspecified: Secondary | ICD-10-CM

## 2015-10-06 LAB — POCT CBC
Granulocyte percent: 79.1 %G (ref 37–80)
HCT, POC: 45.1 % (ref 43.5–53.7)
Hemoglobin: 15.3 g/dL (ref 14.1–18.1)
Lymph, poc: 1.9 (ref 0.6–3.4)
MCH, POC: 28.5 pg (ref 27–31.2)
MCHC: 34.1 g/dL (ref 31.8–35.4)
MCV: 83.8 fL (ref 80–97)
MID (cbc): 0.5 (ref 0–0.9)
MPV: 6.3 fL (ref 0–99.8)
POC Granulocyte: 9.1 — AB (ref 2–6.9)
POC LYMPH PERCENT: 16.3 %L (ref 10–50)
POC MID %: 4.6 %M (ref 0–12)
Platelet Count, POC: 368 10*3/uL (ref 142–424)
RBC: 5.38 M/uL (ref 4.69–6.13)
RDW, POC: 13.3 %
WBC: 11.5 10*3/uL — AB (ref 4.6–10.2)

## 2015-10-06 LAB — GLUCOSE, POCT (MANUAL RESULT ENTRY): POC Glucose: 123 mg/dl — AB (ref 70–99)

## 2015-10-06 MED ORDER — PREDNISONE 20 MG PO TABS: ORAL_TABLET | ORAL | Status: DC

## 2015-10-06 MED ORDER — LEVOFLOXACIN 500 MG PO TABS: 500.0000 mg | ORAL_TABLET | Freq: Every day | ORAL | Status: DC

## 2015-10-06 NOTE — Progress Notes (Addendum)
Subjective:  This chart was scribed for Tami Lin, MD by Moises Blood, Medical Scribe. This patient was seen in Room 12 and the patient's care was started at 4:57 PM.    Patient ID: Jonathan Miles, male    DOB: [birth year], 54 y.o.   MRN: [id number] Chief Complaint  Patient presents with   Follow-up    Was seen on the 1st, still not feeling any better.     HPI Jonathan Miles is a 54 y.o. male who presents to [facility] for follow up. He was seen 9 days ago by Dr. Linna Darner for acute bronchitis. He was prescribed augmentin. Pt states that he is still not feeling any better.   Illness He's been having headaches with fatigue and chills. He also notes productive cough still persistent, and nasal congestion. He has some shortness of breath on exertion. He denies fever, trouble swallowing and urinary symptoms. Loss of appetite but no N/V/D. No GU sxt. No Edema.   Chest skin change He had heart surgery in July 2013 AV Repl-still AI w/ decr EF-see last CV OV 9/16.Marland Kitchen Recently, he noticed the incision scar changing colors-more red/thicker. He denies any changes with work. Lots of exertion.  DM He denies any control issues with his diabetes.   Patient Active Problem List   Diagnosis Date Noted   Aortic insufficiency 11/23/2012   Chronic diastolic CHF (congestive heart failure) ([place]) 11/23/2012   CAD (coronary artery disease) 11/27/2011   OSA (obstructive sleep apnea) 11/11/2011   Aortic root dilatation ([place])    Chest pain 01/23/2011   Hyperlipidemia 01/23/2011   Hypertension 01/23/2011    important outpatient prescriptions:    amoxicillin-clavulanate (AUGMENTIN) 875-125 MG tablet, Take 1 tablet by mouth 2 (two) times daily., Disp: 20 tablet, Rfl: 0---completed   atorvastatin (LIPITOR) 40 MG tablet, TAKE 1 TABLET (40MG  TOTAL) BY MOUTH DAILY., Disp: 90 tablet, Rfl: 3   carvedilol (COREG) 12.5 MG tablet, TAKE 1 TABLET (12.5 MG TOTAL) BY MOUTH 2 (TWO) TIMES DAILY., Disp: 180  tablet, Rfl: 3   furosemide (LASIX) 40 MG tablet, TAKE 1 TABLET (40 MG TOTAL) BY MOUTH DAILY., Disp: 90 tablet, Rfl: 3   lisinopril (PRINIVIL,ZESTRIL) 20 MG tablet, Take 1 tablet (20 mg total) by mouth daily., Disp: 90 tablet, Rfl: 3   metFORMIN (GLUCOPHAGE) 500 MG tablet, Take 2 tablets by mouth twice daily., Disp: 120 tablet, Rfl: 12  Review of Systems  Constitutional: Positive for chills, appetite change, fatigue and unexpected weight change. Negative for fever.  HENT: Positive for congestion and rhinorrhea. Negative for sinus pressure, sore throat and trouble swallowing.   Eyes: Negative for visual disturbance.  Respiratory: Positive for cough, chest tightness and shortness of breath.   Cardiovascular: Negative for chest pain, palpitations and leg swelling.  Gastrointestinal: Negative for abdominal pain.  Genitourinary: Negative for dysuria, urgency, frequency and hematuria.  Neurological: Positive for headaches. Negative for dizziness.       Objective:   Physical Exam  Constitutional: He is oriented to person, place, and time. He appears well-developed and well-nourished. No distress.  Looks washed out  HENT:  Head: Normocephalic and atraumatic.  Right Ear: External ear normal.  Left Ear: External ear normal.  Mouth/Throat: Oropharynx is clear and moist.  Nose congested  Eyes: Conjunctivae and EOM are normal. Pupils are equal, round, and reactive to light. No scleral icterus.  Neck: Neck supple.  Cardiovascular: Normal rate, regular rhythm and intact distal pulses.  Exam reveals no gallop.   No murmur heard.  S2 not clear  Pulmonary/Chest: Effort normal. No respiratory distress. He has wheezes (on expiration).  Wheezing at end of forced expir  Musculoskeletal: Normal range of motion. He exhibits no edema.  Lymphadenopathy:    He has no cervical adenopathy.  Neurological: He is alert and oriented to person, place, and time. No cranial nerve deficit.  Skin: Skin is warm  and dry.  Psychiatric: He has a normal mood and affect. His behavior is normal.  Nursing note and vitals reviewed.   BP 140/82 mmHg   Pulse 82   Temp(Src) 99 F (37.2 C) (Oral)   Resp 18   Wt 322 lb 3.2 oz (146.149 kg)   SpO2 98% Wt Readings from Last 3 Encounters:  10/06/15 322 lb 3.2 oz (146.149 kg)  09/27/15 331 lb 12.8 oz (150.503 kg)  07/12/15 337 lb 1.9 oz (152.917 kg)    [facility] reading (PRIMARY) by Dr. Laney Pastor : chest xray: bilateral increased markings without lobar consolidation      Results for orders placed or performed in visit on 10/06/15  POCT CBC  Result Value Ref Range   WBC 11.5 (A) 4.6 - 10.2 K/uL   Lymph, poc 1.9 0.6 - 3.4   POC LYMPH PERCENT 16.3 10 - 50 %L   MID (cbc) 0.5 0 - 0.9   POC MID % 4.6 0 - 12 %M   POC Granulocyte 9.1 (A) 2 - 6.9   Granulocyte percent 79.1 37 - 80 %G   RBC 5.38 4.69 - 6.13 M/uL   Hemoglobin 15.3 14.1 - 18.1 g/dL   HCT, POC 45.1 43.5 - 53.7 %   MCV 83.8 80 - 97 fL   MCH, POC 28.5 27 - 31.2 pg   MCHC 34.1 31.8 - 35.4 g/dL   RDW, POC 13.3 %   Platelet Count, POC 368 142 - 424 K/uL   MPV 6.3 0 - 99.8 fL  POCT glucose (manual entry)  Result Value Ref Range   POC Glucose 123 (A) 70 - 99 mg/dl  still L shift  Assessment & Plan:  Cough - persistent, now somewhat productive  Fatigue -  Comprehensive metabolic panel--r/o hypokalemia  Appetite loss - 9lb wt loss in 9 days  Chills - ?fever  Head congestion--?persistent sinus infec despite augmentin  Wheezing - RAD vs pneumonia not yet obvious on xray vs CHF(doubtful with wt loss, PO2 98, and no tachycardia)  AODM--stable A1C 6.4 on 9/16  CHF s/p valve repl w/ AI mild to mod per last echo- Brain natriuretic peptide  Meds ordered this encounter  Medications   levofloxacin (LEVAQUIN) 500 MG tablet    Sig: Take 1 tablet (500 mg total) by mouth daily.    Dispense:  7 tablet    Refill:  0   predniSONE (DELTASONE) 20 MG tablet    Sig: 3/3/2/2/1/1 single daily dose for 6 days     Dispense:  12 tablet    Refill:  0   Call w labs F/u 48hrs-2pm Monday--sooner if worse   By signing my name below, I, Moises Blood, attest that this documentation has been prepared under the direction and in the presence of Tami Lin, MD. Electronically Signed: Moises Blood, [place]. 10/06/2015 , 4:57 PM .  I have completed the patient encounter in its entirety as documented by the scribe, with editing by me where necessary. Robert P. Laney Pastor, M.D.

## 2015-10-07 LAB — COMPREHENSIVE METABOLIC PANEL
ALT: 15 U/L (ref 9–46)
AST: 9 U/L — ABNORMAL LOW (ref 10–35)
Albumin: 4.2 g/dL (ref 3.6–5.1)
Alkaline Phosphatase: 58 U/L (ref 40–115)
BUN: 13 mg/dL (ref 7–25)
CO2: 30 mmol/L (ref 20–31)
Calcium: 9.5 mg/dL (ref 8.6–10.3)
Chloride: 102 mmol/L (ref 98–110)
Creat: 0.92 mg/dL (ref 0.70–1.33)
Glucose, Bld: 113 mg/dL — ABNORMAL HIGH (ref 65–99)
Potassium: 4.4 mmol/L (ref 3.5–5.3)
Sodium: 140 mmol/L (ref 135–146)
Total Bilirubin: 0.8 mg/dL (ref 0.2–1.2)
Total Protein: 7 g/dL (ref 6.1–8.1)

## 2015-10-08 ENCOUNTER — Ambulatory Visit (INDEPENDENT_AMBULATORY_CARE_PROVIDER_SITE_OTHER): Payer: BLUE CROSS/BLUE SHIELD | Admitting: Internal Medicine

## 2015-10-08 VITALS — BP 144/84 | HR 66 | Temp 98.4°F | Resp 16 | Ht 76.0 in | Wt 324.0 lb

## 2015-10-08 DIAGNOSIS — R05 Cough: Secondary | ICD-10-CM

## 2015-10-08 DIAGNOSIS — R062 Wheezing: Secondary | ICD-10-CM | POA: Diagnosis not present

## 2015-10-08 DIAGNOSIS — R059 Cough, unspecified: Secondary | ICD-10-CM

## 2015-10-08 LAB — BRAIN NATRIURETIC PEPTIDE: Brain Natriuretic Peptide: 26.3 pg/mL (ref 0.0–100.0)

## 2015-10-08 NOTE — Progress Notes (Signed)
Subjective:  By signing my name below, I, Essence Howell, attest that this documentation has been prepared under the direction and in the presence of Leandrew Koyanagi, MD Electronically Signed: Ladene Artist, ED Scribe 10/08/2015 at 7:34 PM.   Patient ID: Jonathan Miles, male    DOB: Jul 22, 1961, 54 y.o.   MRN: [id number]  Chief Complaint  Patient presents with   Follow-up    sinus   HPI HPI Comments: Jonathan Miles is a 54 y.o. male, with a h/o asthma, HTN and DM, who presents to the [facility] for a follow-up regarding sinusitis. Pt was seen 11 days ago for HA, cough, postnasal drip onset 4 days prior. Pt was dischagred with Augmentin and Hycodan which he states provided no relief. He returned to the office 2 days ago with persistent HA, fatigue, chills, productive cough, nasal congestion, loss of appetite. Pt was discharged with Levquin and Prednisone. Today, pt presents with gradually improving fatigue and SOB that has resolved. He states that he is still experiencing some productive cough with sputum but states that this has improved as well.   Patient Active Problem List   Diagnosis Date Noted   Aortic insufficiency 11/23/2012   Chronic diastolic CHF (congestive heart failure) ([place]) 11/23/2012   CAD (coronary artery disease) 11/27/2011   OSA (obstructive sleep apnea) 11/11/2011   Aortic root dilatation ([facility])    Chest pain 01/23/2011   Hyperlipidemia 01/23/2011   Hypertension 01/23/2011    Current Outpatient Prescriptions on File Prior to Visit  Medication Sig Dispense Refill   acetaminophen (TYLENOL) 500 MG tablet TAKE 1-2 TABS EVERY 6 HOURS AS NEEDED FOR PAIN     amoxicillin-clavulanate (AUGMENTIN) 875-125 MG tablet Take 1 tablet by mouth 2 (two) times daily. 20 tablet 0   aspirin 81 MG tablet Take 81 mg by mouth daily.     atorvastatin (LIPITOR) 40 MG tablet TAKE 1 TABLET (40MG  TOTAL) BY MOUTH DAILY. 90 tablet 3   benzonatate (TESSALON)  100 MG capsule Take 1-2 capsules (100-200 mg total) by mouth 3 (three) times daily as needed. 30 capsule 0   carvedilol (COREG) 12.5 MG tablet TAKE 1 TABLET (12.5 MG TOTAL) BY MOUTH 2 (TWO) TIMES DAILY. 180 tablet 3   Diclofenac Sodium 3 % GEL Rub about 1 inch of gel into each knee 2-3 times daily 100 g 3   felodipine (PLENDIL) 10 MG 24 hr tablet TAKE ONE TABLET BY MOUTH DAILY 90 tablet 3   furosemide (LASIX) 40 MG tablet TAKE 1 TABLET (40 MG TOTAL) BY MOUTH DAILY. 90 tablet 3   HYDROcodone-homatropine (HYCODAN) 5-1.5 MG/5ML syrup Take 5 mLs by mouth every 4 (four) hours as needed. 120 mL 0   levofloxacin (LEVAQUIN) 500 MG tablet Take 1 tablet (500 mg total) by mouth daily. 7 tablet 0   lisinopril (PRINIVIL,ZESTRIL) 20 MG tablet Take 1 tablet (20 mg total) by mouth daily. 90 tablet 3   loratadine (CLARITIN) 10 MG tablet Take 10 mg by mouth daily.     metFORMIN (GLUCOPHAGE) 500 MG tablet Take 2 tablets by mouth twice daily. 120 tablet 12   predniSONE (DELTASONE) 20 MG tablet 3/3/2/2/1/1 single daily dose for 6 days 12 tablet 0   tamsulosin (FLOMAX) 0.4 MG CAPS capsule Take 1 capsule (0.4 mg total) by mouth daily after supper. 30 capsule 5   traMADol (ULTRAM) 50 MG tablet Take 1 tablet (50 mg total) by mouth every 8 (eight) hours as needed. 60 tablet 1  No current facility-administered medications on file prior to visit.   Allergies  Allergen Reactions   Iodinated Diagnostic Agents Hives    Hives started 4 days after cta chest was performed, minimal relief w/ benadryl x 3 days, uncertain if reaction was actually iv contrast or other allergen,allergy tests to follow.wife will make [place] aware of results//a.calhoun   Review of Systems  Constitutional: Positive for fatigue (improving).  Respiratory: Positive for cough. Negative for shortness of breath.       Objective:   Physical Exam  Constitutional: He is oriented to person, place, and time. He appears well-developed and  well-nourished. No distress.  HENT:  Head: Normocephalic and atraumatic.  Eyes: Conjunctivae and EOM are normal.  Neck: Neck supple.  Cardiovascular: Normal rate, regular rhythm and normal heart sounds.  Exam reveals no gallop.   No murmur heard. Pulmonary/Chest: Effort normal and breath sounds normal. No respiratory distress. He has no wheezes.  Chest is relatively clear although he still has coughing with forced expiration but no wheezes.   Musculoskeletal: Normal range of motion.  Neurological: He is alert and oriented to person, place, and time.  Skin: Skin is warm and dry.  Psychiatric: He has a normal mood and affect. His behavior is normal.  Nursing note and vitals reviewed.     Assessment & Plan:  Cough and Wheezing due to LRI w/ RAD now responding to treatment  Finish meds rec OOW til well   I have completed the patient encounter in its entirety as documented by the scribe, with editing by me where necessary. Robert P. Laney Pastor, M.D.

## 2015-10-11 ENCOUNTER — Other Ambulatory Visit: Payer: Self-pay | Admitting: *Deleted

## 2015-10-11 DIAGNOSIS — Z9889 Other specified postprocedural states: Secondary | ICD-10-CM

## 2015-10-11 DIAGNOSIS — Z91041 Radiographic dye allergy status: Secondary | ICD-10-CM

## 2015-10-11 MED ORDER — PREDNISONE 50 MG PO TABS: ORAL_TABLET | ORAL | Status: DC

## 2015-10-11 MED ORDER — DIPHENHYDRAMINE HCL 25 MG PO CAPS
50.0000 mg | ORAL_CAPSULE | Freq: Four times a day (QID) | ORAL | Status: DC | PRN
Start: 2015-10-11 — End: 2017-01-06

## 2015-10-15 ENCOUNTER — Ambulatory Visit
Admission: RE | Admit: 2015-10-15 | Discharge: 2015-10-15 | Disposition: A | Discharge status: Home / Self Care | Payer: BLUE CROSS/BLUE SHIELD | Source: Ambulatory Visit | Attending: Cardiothoracic Surgery | Admitting: Cardiothoracic Surgery

## 2015-10-15 ENCOUNTER — Ambulatory Visit (INDEPENDENT_AMBULATORY_CARE_PROVIDER_SITE_OTHER): Payer: BLUE CROSS/BLUE SHIELD | Admitting: Cardiothoracic Surgery

## 2015-10-15 ENCOUNTER — Encounter: Payer: Self-pay | Admitting: Cardiothoracic Surgery

## 2015-10-15 VITALS — BP 153/87 | HR 100 | Resp 20 | Ht 76.0 in | Wt 324.0 lb

## 2015-10-15 DIAGNOSIS — Z9889 Other specified postprocedural states: Secondary | ICD-10-CM

## 2015-10-15 DIAGNOSIS — I77810 Thoracic aortic ectasia: Secondary | ICD-10-CM | POA: Diagnosis not present

## 2015-10-15 MED ORDER — IOPAMIDOL (ISOVUE-370) INJECTION 76%
75.0000 mL | Freq: Once | INTRAVENOUS | Status: AC | PRN
  Administered 2015-10-15: 75 mL via INTRAVENOUS

## 2015-10-15 NOTE — Progress Notes (Signed)
[address]       [place] [address]             [phone]                                       Jonathan Miles [facility] Medical Record [id number] Date of Birth: [birth year]  Dr  Keturah Barre Roselie Awkward, MD  Chief Complaint:   PostOp Follow Up Visit 05/07/2012  OPERATIVE REPORT  PREOPERATIVE DIAGNOSIS: Dilated aortic root.  POSTOPERATIVE DIAGNOSIS: Dilated aortic root.  SURGICAL PROCEDURE: Valve-sparing aortic root replacement and coronary  artery bypass grafting x1 with second reverse saphenous vein graft to  the distal right coronary artery with right thigh endo vein harvesting.    History of Present Illness:     Feels well, back at work full time. He denies symptoms of heart failure. He has had increased his diligence concerning DM, notes his Hga1c has decreased. Last echo was 11/2014 with stable mild AI. Comes in today with follow up cta of chest in follow up aortic root and ascending aortic replacement 04/2012.     History  Smoking status  . Former Smoker -- 1.00 packs/day for 13 years  . Types: Cigarettes  . Quit date: 01/23/1988  Smokeless tobacco  . Never Used       Allergies  Allergen Reactions  . Iodinated Diagnostic Agents Hives    Hives started 4 days after cta chest was performed, minimal relief w/ benadryl x 3 days, uncertain if reaction was actually iv contrast or other allergen,allergy tests to follow.wife will make [place] aware of results//a.calhoun    Current Outpatient Prescriptions  Medication Sig Dispense Refill  . acetaminophen (TYLENOL) 500 MG tablet TAKE 1-2 TABS EVERY 6 HOURS AS NEEDED FOR PAIN    . aspirin 81 MG tablet Take 81 mg by mouth daily.    Marland Kitchen atorvastatin (LIPITOR) 40 MG tablet TAKE 1 TABLET (40MG  TOTAL) BY MOUTH DAILY. 90 tablet 3  . carvedilol (COREG) 12.5 MG tablet TAKE 1 TABLET (12.5 MG TOTAL) BY MOUTH 2 (TWO) TIMES DAILY. 180 tablet 3  . Diclofenac Sodium 3 % GEL Rub about 1 inch of gel into each knee  2-3 times daily 100 g 3  . diphenhydrAMINE (BENADRYL) 25 mg capsule Take 2 capsules (50 mg total) by mouth every 6 (six) hours as needed. Take 2 capsules at 8 am on Monday morning 10/15/15 2 capsule 0  . felodipine (PLENDIL) 10 MG 24 hr tablet TAKE ONE TABLET BY MOUTH DAILY 90 tablet 3  . furosemide (LASIX) 40 MG tablet TAKE 1 TABLET (40 MG TOTAL) BY MOUTH DAILY. 90 tablet 3  . HYDROcodone-homatropine (HYCODAN) 5-1.5 MG/5ML syrup Take 5 mLs by mouth every 4 (four) hours as needed. 120 mL 0  . lisinopril (PRINIVIL,ZESTRIL) 20 MG tablet Take 1 tablet (20 mg total) by mouth daily. 90 tablet 3  . loratadine (CLARITIN) 10 MG tablet Take 10 mg by mouth daily.    . metFORMIN (GLUCOPHAGE) 500 MG tablet Take 2 tablets by mouth twice daily. 120 tablet 12  . tamsulosin (FLOMAX) 0.4 MG CAPS capsule Take 1 capsule (0.4 mg total) by mouth daily after supper. 30 capsule 5  . traMADol (ULTRAM) 50 MG tablet Take 1 tablet (50 mg total) by mouth every 8 (eight) hours as needed. 60 tablet 1   No current facility-administered medications for  this visit.       Physical Exam: BP 153/87 mmHg  Pulse 100  Resp 20  Ht 6\' 4"  (1.93 m)  Wt 324 lb (146.965 kg)  BMI 39.45 kg/m2  SpO2 97%  General appearance: alert and cooperative Neurologic: intact Heart: regular rate and rhythm, S1, S2 normal, no murmur, click, rub or gallop, normal apical impulse, no rub and no murmur of AI Lungs: clear to auscultation bilaterally and normal percussion bilaterally Abdomen: soft, non-tender; bowel sounds normal; no masses,  no organomegaly Extremities: extremities normal, atraumatic, no cyanosis or edema and Homans sign is negative, no sign of DVT Wound: sternum stable, still has significant keloid still with some redness has thickened at lower sternum   Diagnostic Studies & Laboratory data:          Recent Radiology Findings:  Ct Angio Chest Aorta W/cm &/or Wo/cm  10/15/2015  CLINICAL DATA:  Status post aortic root  replacement and coronary bypass EXAM: CT ANGIOGRAPHY CHEST WITH CONTRAST TECHNIQUE: Multidetector CT imaging of the chest was performed using the standard protocol during bolus administration of intravenous contrast. Multiplanar CT image reconstructions and MIPs were obtained to evaluate the vascular anatomy. CONTRAST:  75 cc Isovue 370 COMPARISON:  06/17/2013 FINDINGS: Mediastinum/Lymph Nodes: Patient is status post median sternotomy. Prior coronary bypass and aortic root repair noted. No aneurysm, dissection, mediastinal hemorrhage, or hematoma. Stable ectasia of the coronary origins, as before. Visualized central pulmonary arteries appear patent. No significant proximal filling defect or pulmonary embolus demonstrated. Native coronary atherosclerosis noted. Normal heart size. No pericardial or pleural effusion. No adenopathy. Lungs/Pleura: Lungs remain clear. No focal pneumonia, collapse or consolidation. Stable calcified left lower lobe granuloma and left hilar lymph node. No interstitial process or edema. No pneumothorax. Trachea and central airways are patent. Upper abdomen: Splenic punctate calcified granulomas evident. No acute upper abdominal finding. Colonic diverticulosis noted. Musculoskeletal: Minor degenerative endplate changes of the thoracic spine. No compression fracture. Prior median sternotomy. No acute osseous finding. Review of the MIP images confirms the above findings. IMPRESSION: Stable postoperative changes of the thoracic aorta. No acute intra thoracic process or interval change. Remote granulomatous disease. Electronically Signed   By: Jerilynn Mages.  Shick M.D.   On: 10/15/2015 10:03   Ct Angio Chest Aorta W/cm &/or Wo/cm  06/17/2013   *RADIOLOGY REPORT*  Clinical Data: Follow-up status post aortic root replacement  CT ANGIOGRAPHY CHEST  Technique:  Multidetector CT imaging of the chest using the standard protocol during bolus administration of intravenous contrast. Multiplanar reconstructed  images including MIPs were obtained and reviewed to evaluate the vascular anatomy.  Contrast: 17mL OMNIPAQUE IOHEXOL 350 MG/ML SOLN  Comparison: Most recent prior CTA of the chest 09/25/2011; most recent prior chest x-ray 10/07/2012  Findings:  Mediastinum: Unremarkable CT appearance of the thyroid gland.  No suspicious mediastinal or hilar adenopathy.  No soft tissue mediastinal mass.  The thoracic esophagus is unremarkable.  Heart/Vascular:  Status post median sternotomy with evidence of valve sparing tube graft replacement of the ascending aorta and the aortic to RCA saphenous vein graft.  The vein graft opacifies with contrast material.  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  This is unchanged compared to prior. The aortic anastomosis is intact.  No evidence of periaortic fluid, or hematoma.  The heart is within normal limits for size.  No pericardial effusion.  Atherosclerotic vascular calcifications noted in the left anterior descending coronary artery.  Lungs/Pleura: Stable calcified granuloma  in the inferior left lower lobe.  The lungs are otherwise clear.  Upper Abdomen: Scattered punctate calcifications throughout the spleen consistent with the sequela of remote granulomatous disease. Otherwise, visualization of the upper abdomen is unremarkable.  Bones: No acute fracture or aggressive appearing lytic or blastic osseous lesion.  Healed median sternotomy.  Sternotomy wires are intact.  IMPRESSION:  1. Compared to the prior chest CTA from November 2012 there are interval surgical changes of repair of the ascending aorta with a straight tube graft as well as single vessel CABG to the right coronary artery.  No evidence of complication including recurrent aneurysmal dilatation, dissection, leak or mediastinal hematoma. The visualized portion of the aortic to RCA saphenous vein graft opacifies with contrast material.  2.  Sequela of remote granulomatous disease.    Original Report Authenticated By: Jacqulynn Cadet, M.D.     Recent Labs: Lab Results  Component Value Date   WBC 11.5* 10/06/2015   HGB 15.3 10/06/2015   HCT 45.1 10/06/2015   PLT 324.0 12/04/2014   GLUCOSE 113* 10/06/2015   CHOL 138 06/18/2015   TRIG 55.0 06/18/2015   HDL 40.10 06/18/2015   LDLCALC 87 06/18/2015   ALT 15 10/06/2015   AST 9* 10/06/2015   NA 140 10/06/2015   K 4.4 10/06/2015   CL 102 10/06/2015   CREATININE 0.92 10/06/2015   BUN 13 10/06/2015   CO2 30 10/06/2015   INR 1.54* 05/07/2012   HGBA1C 6.4 07/06/2015   Echo reviewed from 11/2014, mild ai with intact graft and leaflets   Assessment / Plan:   Steady post op improvement after valve spearing aortic root replacemnt and Cabg x1 to RCA.  Will see back  in one year  Follow up echo , one year past previous, ? FEB 2017, Cardiology to arrange    Grace Isaac MD 10/16/2015 8:19 AM

## 2015-10-16 NOTE — Progress Notes (Signed)
Pt needs an echo in Feb 2017, there is already an order in [place], I will forward message to [facility] to contact pt to schedule echo for Feb 2017.

## 2015-10-27 ENCOUNTER — Other Ambulatory Visit: Payer: Self-pay | Admitting: Family Medicine

## 2015-11-02 ENCOUNTER — Ambulatory Visit (INDEPENDENT_AMBULATORY_CARE_PROVIDER_SITE_OTHER): Payer: BLUE CROSS/BLUE SHIELD | Admitting: Cardiology

## 2015-11-02 ENCOUNTER — Encounter: Payer: Self-pay | Admitting: Cardiology

## 2015-11-02 VITALS — BP 152/90 | HR 88 | Ht 76.0 in | Wt 338.8 lb

## 2015-11-02 DIAGNOSIS — I5032 Chronic diastolic (congestive) heart failure: Secondary | ICD-10-CM

## 2015-11-02 DIAGNOSIS — I2510 Atherosclerotic heart disease of native coronary artery without angina pectoris: Secondary | ICD-10-CM | POA: Diagnosis not present

## 2015-11-02 DIAGNOSIS — I351 Nonrheumatic aortic (valve) insufficiency: Secondary | ICD-10-CM | POA: Diagnosis not present

## 2015-11-02 NOTE — Patient Instructions (Signed)
Medication Instructions:  No changes today  Labwork: None   Testing/Procedures: Your physician has requested that you have an echocardiogram. Echocardiography is a painless test that uses sound waves to create images of your heart. It provides your doctor with information about the size and shape of your heart and how well your heart's chambers and valves are working. This procedure takes approximately one hour. There are no restrictions for this procedure. February 2017    Follow-Up: Your physician wants you to follow-up in: 6 months with Dr Jonathan Miles. (July 2017). You will receive a reminder letter in the mail two months in advance. If you don't receive a letter, please call our office to schedule the follow-up appointment.   Any Other Special Instructions Will Be Listed Below (If Applicable).  Your physician has requested that you regularly monitor and record your blood pressure readings at home. Please use the same machine at the same time of day to check your readings and record them.  Send a MyChart messge to Dr Jonathan Miles in about 2 weeks with the readings.      If you need a refill on your cardiac medications before your next appointment, please call your pharmacy.

## 2015-11-03 NOTE — Progress Notes (Signed)
Patient ID: Jonathan Miles, male   DOB: Oct 09, 1961, 55 y.o.   MRN: [id number] PCP: Dr. Ruben Reason  55 yo with history of aortic root aneurysm and CAD returns for followup.  He underwent aortic valve sparing aortic root replacement with CABG x1 (SVG-RCA) on 05/07/12.  I noted a new diastolic murmur on exam and echo in 10/13 showed EF 55-60% with mild to moderate eccentric aortic insufficiency.  Last echo in 2/16 showed normal EF with mild to moderate eccentric aortic insufficiency.  Most recent CTA in 12/16 showed stable post-op aortic root/ascending aorta.    He is working full time.  Mild exertional dyspnea with heavy activity.  No dyspnea walking on flat ground or climbing a flight of steps.  Weight is stable.  BP high today but he just had a stressful telephone call. He has been unable to tolerate CPAP.   ECG: NSR, normal    Labs (3/12): K 4, creatinine 1.03, LDL 113, HDL 33  Labs (4/12): K 3.7, creatinine 0.8  Labs (1/13): K 3.6, creatinine 0.9, LDL 133, HDL 42  Labs (7/13):  K 3.9, creatinine 0.91, Hgb 8.9 Labs (12/13): LDL 98, HDL 49 Labs (1/14): K 3.7, creatinine 0.9, BNP 268 Labs (8/16): LDL 87, HDL 40 Labs (12/16): K 4.4, creatinine 0.92, BNP 26   Wt Readings from Last 3 Encounters:  11/02/15 338 lb 12.8 oz (153.679 kg)  10/15/15 324 lb (146.965 kg)  10/08/15 324 lb (146.965 kg)     Past Medical History: 1. DM  2. Hyperlipidemia  3. HTN  4. Aortic root/ascending aortic aneurysm: 4/12 Echo with 5.4 cm aortic root; 4/12 MRA chest with 5.5 cm aortic root, 3.8 cm ascending thoracic aorta; 10/12 CTA chest with 6.1 x 5.1 cm aortic root/proximal ascending aorta, 4.2 x 4.1 cm mid ascending aorta; 1/13 Echo with 5.4 cm root, 5.5 cm proximal ascending thoracic aorta. S/p valve-sparing aortic root and ascending aorta replacement with CABG x 1 (SVG-RCA) 04/2012 with Dr. Servando Snare.  CTA chest (12/16) with stable post-op aortic root and ascending aorta.  5. Aortic insufficiency: Echo (1/13):  EF 55%, mild LVH, 5.4 cm aortic root, 5.5 cm proximal ascending thoracic aorta. Trileaflet aortic valve with trivial AI.  Echo (10/13) EF 55-60%, s/p valve-sparing aortic root replacement, eccentric mild to moderate AI, normal RV.  Echo (2/16) with EF 55-60%, moderate LVH, mild-moderate eccentric AI, moderate LAE, aortic root 3.8 cm, ascending aorta 4.3 cm.  6. Possible contrast allergy  7. CAD: Myoview (4/12): EF 67%, no ischemia or infarction.  LHC (1/13) with long 50% D2 stenosis, 60% proximal RCA, and 50% mid RCA. S/p CABG x 1 with SVG0-RCA at time of ascending aortic aneurysm repair. 8. ? Brief peri-operative atrial fibrillation in 7/13. Nausea with amiodarone.  9. Diastolic CHF 10. OSA: Unable to tolerate CPAP.    Current Outpatient Prescriptions  Medication Sig Dispense Refill  . acetaminophen (TYLENOL) 500 MG tablet TAKE 1-2 TABS EVERY 6 HOURS AS NEEDED FOR PAIN    . aspirin 81 MG tablet Take 81 mg by mouth daily.    Marland Kitchen atorvastatin (LIPITOR) 40 MG tablet TAKE 1 TABLET (40MG  TOTAL) BY MOUTH DAILY. 90 tablet 3  . carvedilol (COREG) 12.5 MG tablet TAKE 1 TABLET (12.5 MG TOTAL) BY MOUTH 2 (TWO) TIMES DAILY. 180 tablet 3  . Diclofenac Sodium 3 % GEL Rub about 1 inch of gel into each knee 2-3 times daily 100 g 3  . diphenhydrAMINE (BENADRYL) 25 mg capsule Take 2 capsules (50 mg  total) by mouth every 6 (six) hours as needed. Take 2 capsules at 8 am on Monday morning 10/15/15 2 capsule 0  . felodipine (PLENDIL) 10 MG 24 hr tablet TAKE ONE TABLET BY MOUTH DAILY 90 tablet 3  . furosemide (LASIX) 40 MG tablet TAKE 1 TABLET (40 MG TOTAL) BY MOUTH DAILY. 90 tablet 3  . lisinopril (PRINIVIL,ZESTRIL) 20 MG tablet Take 1 tablet (20 mg total) by mouth daily. 90 tablet 3  . loratadine (CLARITIN) 10 MG tablet Take 10 mg by mouth daily.    . metFORMIN (GLUCOPHAGE) 500 MG tablet Take 2 tablets by mouth twice daily. 120 tablet 12  . tamsulosin (FLOMAX) 0.4 MG CAPS capsule Take 1 capsule (0.4 mg total) by  mouth daily after supper. 30 capsule 5  . traMADol (ULTRAM) 50 MG tablet Take 50 mg by mouth every 8 (eight) hours as needed for moderate pain.     No current facility-administered medications for this visit.    Allergies: Allergies  Allergen Reactions  . Iodinated Diagnostic Agents Hives    Hives started 4 days after cta chest was performed, minimal relief w/ benadryl x 3 days, uncertain if reaction was actually iv contrast or other allergen,allergy tests to follow.wife will make [place] aware of results//a.[email or web address]    Social History  Substance Use Topics  . Smoking status: Former Smoker -- 1.00 packs/day for 13 years    Types: Cigarettes    Quit date: 01/23/1988  . Smokeless tobacco: Never Used  . Alcohol Use: No   Family History  Problem Relation Age of Onset  . Adopted: Yes  . Family history unknown: Yes   ROS: All systems reviewed and negative except as per HPI.   PHYSICAL EXAM: VS:  BP 152/90 mmHg  Pulse 88  Ht 6\' 4"  (1.93 m)  Wt 338 lb 12.8 oz (153.679 kg)  BMI 41.26 kg/m2 General: Obese, NAD HEENT: normal Neck: JVP not elevated Cardiac:  normal S1, S2; RRR; 1/6 diastolic murmur LUSB Chest: Median sternotomy scar well-healed Lungs:  clear to auscultation bilaterally, no wheezing, rhonchi or rales Abd: soft, nontender, no hepatomegaly Ext: No edema Skin: warm and dry  Assessment/Plan:  CAD  Moderate RCA stenosis on cath, now s/p SVG-RCA with recent aortic root/ascending aorta aneurysm replacement. Continue ASA, statin. No chest pain. Aortic root dilatation  Status post aortic root/ascending aorta valve-sparing replacement. CTA chest in 12/16 showed stable post-op aortic root and ascending aorta.   Diastolic CHF No significant dyspnea on current Lasix dose.  He looks euvolemic.   Hypertension BP high today but under stress.  He will check BP daily x 2 wks and send me readings via Mychart.  Aortic insufficiency Murmur noted on exam.  Mild to moderate eccentric AI  on echo. This will need to be followed over time. Need also good BP control.  Repeat echo in 2/17.   Loralie Champagne 11/03/2015

## 2015-11-07 ENCOUNTER — Telehealth: Payer: Self-pay

## 2015-11-07 NOTE — Telephone Encounter (Signed)
Pt was under the impression that we had called his Tramadol in?   Best phone for pt is [place]

## 2015-11-08 NOTE — Telephone Encounter (Signed)
Called pharmacy, Rx was sent in and filled.

## 2015-12-08 ENCOUNTER — Other Ambulatory Visit: Payer: Self-pay | Admitting: Physician Assistant

## 2015-12-10 ENCOUNTER — Other Ambulatory Visit: Payer: Self-pay | Admitting: Physician Assistant

## 2015-12-14 ENCOUNTER — Other Ambulatory Visit: Payer: Self-pay

## 2015-12-14 ENCOUNTER — Ambulatory Visit (HOSPITAL_COMMUNITY): Payer: BLUE CROSS/BLUE SHIELD | Attending: Cardiology

## 2015-12-14 DIAGNOSIS — I340 Nonrheumatic mitral (valve) insufficiency: Secondary | ICD-10-CM | POA: Insufficient documentation

## 2015-12-14 DIAGNOSIS — I110 Hypertensive heart disease with heart failure: Secondary | ICD-10-CM | POA: Diagnosis not present

## 2015-12-14 DIAGNOSIS — Z87891 Personal history of nicotine dependence: Secondary | ICD-10-CM | POA: Diagnosis not present

## 2015-12-14 DIAGNOSIS — I351 Nonrheumatic aortic (valve) insufficiency: Secondary | ICD-10-CM | POA: Diagnosis not present

## 2015-12-14 DIAGNOSIS — I77810 Thoracic aortic ectasia: Secondary | ICD-10-CM | POA: Diagnosis not present

## 2015-12-14 DIAGNOSIS — E785 Hyperlipidemia, unspecified: Secondary | ICD-10-CM | POA: Insufficient documentation

## 2015-12-14 DIAGNOSIS — I509 Heart failure, unspecified: Secondary | ICD-10-CM | POA: Insufficient documentation

## 2015-12-14 DIAGNOSIS — I712 Thoracic aortic aneurysm, without rupture: Secondary | ICD-10-CM | POA: Insufficient documentation

## 2015-12-14 DIAGNOSIS — G4733 Obstructive sleep apnea (adult) (pediatric): Secondary | ICD-10-CM | POA: Insufficient documentation

## 2015-12-18 ENCOUNTER — Other Ambulatory Visit: Payer: Self-pay | Admitting: Family Medicine

## 2015-12-22 ENCOUNTER — Other Ambulatory Visit: Payer: Self-pay | Admitting: Family Medicine

## 2015-12-23 ENCOUNTER — Other Ambulatory Visit: Payer: Self-pay | Admitting: Family Medicine

## 2015-12-24 NOTE — Telephone Encounter (Signed)
I refilled but am not there at the office to sign rx.  See if someone else (PA) can sign off on it for me.

## 2015-12-26 ENCOUNTER — Other Ambulatory Visit: Payer: Self-pay | Admitting: Family Medicine

## 2015-12-31 NOTE — Telephone Encounter (Signed)
Called in (again) because no record of it being done, and fax received after this asking for RF.

## 2016-01-09 NOTE — Telephone Encounter (Signed)
See after this before more refills.

## 2016-02-01 ENCOUNTER — Other Ambulatory Visit: Payer: Self-pay | Admitting: Family Medicine

## 2016-02-08 ENCOUNTER — Other Ambulatory Visit: Payer: Self-pay | Admitting: Family Medicine

## 2016-02-10 ENCOUNTER — Other Ambulatory Visit: Payer: Self-pay | Admitting: Family Medicine

## 2016-02-11 ENCOUNTER — Other Ambulatory Visit: Payer: Self-pay | Admitting: Family Medicine

## 2016-02-12 ENCOUNTER — Other Ambulatory Visit: Payer: Self-pay | Admitting: Family Medicine

## 2016-02-16 ENCOUNTER — Ambulatory Visit (INDEPENDENT_AMBULATORY_CARE_PROVIDER_SITE_OTHER): Payer: BLUE CROSS/BLUE SHIELD | Admitting: Family Medicine

## 2016-02-16 ENCOUNTER — Other Ambulatory Visit: Payer: Self-pay | Admitting: *Deleted

## 2016-02-16 VITALS — BP 138/82 | HR 74 | Temp 98.0°F | Resp 16 | Ht 76.0 in | Wt 333.2 lb

## 2016-02-16 DIAGNOSIS — E119 Type 2 diabetes mellitus without complications: Secondary | ICD-10-CM

## 2016-02-16 DIAGNOSIS — N400 Enlarged prostate without lower urinary tract symptoms: Secondary | ICD-10-CM

## 2016-02-16 DIAGNOSIS — M25562 Pain in left knee: Secondary | ICD-10-CM | POA: Diagnosis not present

## 2016-02-16 DIAGNOSIS — R972 Elevated prostate specific antigen [PSA]: Secondary | ICD-10-CM

## 2016-02-16 LAB — COMPREHENSIVE METABOLIC PANEL
ALT: 16 U/L (ref 9–46)
AST: 9 U/L — ABNORMAL LOW (ref 10–35)
Albumin: 4.1 g/dL (ref 3.6–5.1)
Alkaline Phosphatase: 61 U/L (ref 40–115)
BUN: 14 mg/dL (ref 7–25)
CO2: 26 mmol/L (ref 20–31)
Calcium: 9.7 mg/dL (ref 8.6–10.3)
Chloride: 104 mmol/L (ref 98–110)
Creat: 0.89 mg/dL (ref 0.70–1.33)
Glucose, Bld: 116 mg/dL — ABNORMAL HIGH (ref 65–99)
Potassium: 4 mmol/L (ref 3.5–5.3)
Sodium: 141 mmol/L (ref 135–146)
Total Bilirubin: 0.6 mg/dL (ref 0.2–1.2)
Total Protein: 7 g/dL (ref 6.1–8.1)

## 2016-02-16 LAB — LIPID PANEL
Cholesterol: 147 mg/dL (ref 125–200)
HDL: 37 mg/dL — ABNORMAL LOW (ref >=40)
LDL Cholesterol: 92 mg/dL (ref <130)
Total CHOL/HDL Ratio: 4 Ratio (ref <=5.0)
Triglycerides: 90 mg/dL (ref <150)
VLDL: 18 mg/dL (ref <30)

## 2016-02-16 LAB — POCT GLYCOSYLATED HEMOGLOBIN (HGB A1C): Hemoglobin A1C: 6.4

## 2016-02-16 LAB — GLUCOSE, POCT (MANUAL RESULT ENTRY): POC Glucose: 118 mg/dl — AB (ref 70–99)

## 2016-02-16 MED ORDER — METFORMIN HCL 1000 MG PO TABS: 1000.0000 mg | ORAL_TABLET | Freq: Two times a day (BID) | ORAL | Status: DC

## 2016-02-16 MED ORDER — TAMSULOSIN HCL 0.4 MG PO CAPS: 0.8000 mg | ORAL_CAPSULE | Freq: Every day | ORAL | Status: DC

## 2016-02-16 MED ORDER — METFORMIN HCL 1000 MG PO TABS: ORAL_TABLET | ORAL | Status: DC

## 2016-02-16 MED ORDER — TRAMADOL HCL 50 MG PO TABS: 50.0000 mg | ORAL_TABLET | Freq: Three times a day (TID) | ORAL | Status: DC | PRN

## 2016-02-16 NOTE — Patient Instructions (Addendum)
If the PSA comes back high we will refer you to urology.  Take the Flomax 0.8 mg ( 20.4 mg) at bedtime  If the increased dose of Flomax does not do the job, we probably should refer you to urology even if the PSA is okay.  Continue your diabetic medication  Work hard on trying to eat right and get regular exercise  Minimize the use of the tramadol, but use it if needed. We will refer you to Dr. Gladstone Lighter  IF you received an x-ray today, you will receive an invoice from [facility]. Please contact [facility] at [phone] with questions or concerns regarding your invoice.   IF you received labwork today, you will receive an invoice from [employer]. Please contact [facility] at [phone] with questions or concerns regarding your invoice.   Our billing staff will not be able to assist you with questions regarding bills from these companies.  You will be contacted with the lab results as soon as they are available. The fastest way to get your results is to activate your My Chart account. Instructions are located on the last page of this paperwork. If you have not heard from [place] regarding the results in 2 weeks, please contact this office.

## 2016-02-16 NOTE — Progress Notes (Signed)
Patient ID: Trayson Zenk, male    DOB: [birth year]  Age: 55 y.o. MRN: [id number]  Chief Complaint  Patient presents with  . Follow-up    joint pain, and about his prostate    Subjective:   55 year old man who is here for a follow-up visit for several things. His diabetes has been in fair control. He does not check his sugar very often. He has been having more problems with left knee pain. He had orthopedic surgery on that knee, scoping, 5 or 6 years ago. No injury but it swells some now hurts laterally. He wears a sleeve. He gets up 2-3 times at night to urinate, and with his borderline high PSA was told to come back now.  Current allergies, medications, problem list, past/family and social histories reviewed.  Objective:  BP 138/82 mmHg  Pulse 74  Temp(Src) 98 F (36.7 C) (Oral)  Resp 16  Ht 6\' 4"  (1.93 m)  Wt 333 lb 3.2 oz (151.139 kg)  BMI 40.58 kg/m2  SpO2 95%  No major acute distress. TMs normal. Throat clear. Neck supple without nodes. Chest clear. Heart regular without murmur. Digital exam reveals prostate gland be moderately large but no nodules could be appreciated.  Assessment & Plan:   Assessment: 1. Type 2 diabetes mellitus without complication, without long-term current use of insulin (HCC)   2. Elevated PSA measurement   3. Knee pain, left   4. BPH (benign prostatic hyperplasia)       Plan: Recheck labs. Leave medicines the same except for increasing the Flomax to 0.8. If that does not do the job will refer to urology. He is to call back and ask for the referral if needed.    Orders Placed This Encounter  Procedures  . Comprehensive metabolic panel  . Lipid panel  . PSA  . Ambulatory referral to Orthopedic Surgery    Referral Priority:  Routine    Referral Type:  Surgical    Referral Reason:  Specialty Services Required    Requested Specialty:  Orthopedic Surgery    Number of Visits Requested:  1  . POCT glucose (manual entry)  . POCT glycosylated  hemoglobin (Hb A1C)    Meds ordered this encounter  Medications  . metFORMIN (GLUCOPHAGE) 1000 MG tablet    Sig: Take 2 tablets by mouth twice daily.    Dispense:  180 tablet    Refill:  3  . tamsulosin (FLOMAX) 0.4 MG CAPS capsule    Sig: Take 2 capsules (0.8 mg total) by mouth daily after supper.    Dispense:  180 capsule    Refill:  3    CYCLE FILL MEDICATION. Authorization is required for next refill.  . traMADol (ULTRAM) 50 MG tablet    Sig: Take 1 tablet (50 mg total) by mouth every 8 (eight) hours as needed.    Dispense:  90 tablet    Refill:  0    CYCLE FILL MEDICATION. Authorization is required for next refill.   Results for orders placed or performed in visit on 02/16/16  POCT glucose (manual entry)  Result Value Ref Range   POC Glucose 118 (A) 70 - 99 mg/dl  POCT glycosylated hemoglobin (Hb A1C)  Result Value Ref Range   Hemoglobin A1C 6.4          Patient Instructions   If the PSA comes back high we will refer you to urology.  Take the Flomax 0.8 mg ( 20.4 mg) at bedtime  If the increased  dose of Flomax does not do the job, we probably should refer you to urology even if the PSA is okay.  Continue your diabetic medication  Work hard on trying to eat right and get regular exercise  Minimize the use of the tramadol, but use it if needed. We will refer you to Dr. Gladstone Lighter  IF you received an x-ray today, you will receive an invoice from [facility]. Please contact [facility] at [phone] with questions or concerns regarding your invoice.   IF you received labwork today, you will receive an invoice from [employer]. Please contact [facility] at [phone] with questions or concerns regarding your invoice.   Our billing staff will not be able to assist you with questions regarding bills from these companies.  You will be contacted with the lab results as soon as they are available. The fastest way to get your  results is to activate your My Chart account. Instructions are located on the last page of this paperwork. If you have not heard from [place] regarding the results in 2 weeks, please contact this office.          Return in about 4 months (around 06/17/2016).   Flemon Kelty,Jahmari, MD 02/16/2016

## 2016-02-18 LAB — PSA: PSA: 3.89 ng/mL (ref <=4.00)

## 2016-02-19 ENCOUNTER — Encounter: Payer: Self-pay | Admitting: *Deleted

## 2016-03-22 ENCOUNTER — Encounter: Payer: Self-pay | Admitting: Physician Assistant

## 2016-04-23 ENCOUNTER — Other Ambulatory Visit: Payer: Self-pay | Admitting: Family Medicine

## 2016-04-24 NOTE — Telephone Encounter (Signed)
Patient is calling to follow up on refill request. Patient asked if another provider can approve the request since Dr. Linna Darner isn't here

## 2016-04-25 ENCOUNTER — Telehealth: Payer: Self-pay

## 2016-04-25 NOTE — Telephone Encounter (Signed)
Pt is calling in requesting a refill on the traMADol (ULTRAM) 50 MG tablet [id number] pharmacy faxed in prescription on Wed 04/23/16 and patient still doesn't have medication filled yet. Patient has also called in about this medication 04/24/16. Feeling like we aren't helping him at all please contact patient about this refill or send it into the pharmacy please.

## 2016-04-26 ENCOUNTER — Other Ambulatory Visit: Payer: Self-pay | Admitting: Physician Assistant

## 2016-04-26 DIAGNOSIS — M25562 Pain in left knee: Secondary | ICD-10-CM

## 2016-04-26 MED ORDER — TRAMADOL HCL 50 MG PO TABS: 50.0000 mg | ORAL_TABLET | Freq: Three times a day (TID) | ORAL | Status: DC | PRN

## 2016-04-26 NOTE — Telephone Encounter (Signed)
I am not available.  However he is a reliable patient.  Ask a PA to give one refill and insist he come in before future refills.

## 2016-04-28 NOTE — Telephone Encounter (Signed)
I called pharm to check to make sure they got the Rx written on 7/1. I was advised they did and pt p/up the same day.

## 2016-05-16 NOTE — Telephone Encounter (Signed)
Noted  

## 2016-05-30 ENCOUNTER — Other Ambulatory Visit: Payer: Self-pay

## 2016-05-30 DIAGNOSIS — M25562 Pain in left knee: Secondary | ICD-10-CM

## 2016-05-30 NOTE — Telephone Encounter (Signed)
Pharm faxed req for RF of tramadol. Dr Linna Darner pt. Last seen for this med 02/16/16. Pt was referred to ortho, but according to referral notes, he didn't go to ortho. Last RF 04/26/16

## 2016-06-01 MED ORDER — TRAMADOL HCL 50 MG PO TABS: 50.0000 mg | ORAL_TABLET | Freq: Three times a day (TID) | ORAL | 0 refills | Status: DC | PRN

## 2016-06-01 NOTE — Telephone Encounter (Signed)
Needs seen before further refills after this.

## 2016-06-02 NOTE — Telephone Encounter (Addendum)
Called in Rx bc no documentation that it was done yesterday. Advised pt of RF and that he is due to be seen before any more RFs can be given. Pt agreed and I advised of Dr Hopper's very limited schedule and that he may want to est care with another provider who will be here more often. Pt also reported that he has not been to the ortho because his co-pay is higher for specialists and he does not want any more surgery if he can manage without it.

## 2016-06-18 ENCOUNTER — Other Ambulatory Visit: Payer: Self-pay | Admitting: Cardiology

## 2016-06-18 MED ORDER — FELODIPINE ER 10 MG PO TB24: 10.0000 mg | ORAL_TABLET | Freq: Every day | ORAL | 0 refills | Status: DC

## 2016-08-22 ENCOUNTER — Other Ambulatory Visit: Payer: Self-pay

## 2016-08-22 NOTE — Telephone Encounter (Signed)
Last OV 02/16/16, last RF 8/6 for #30. Hopper pt. I will pend 1 mos for review. Dr Brigitte Pulse, I believe you are covering Dr Hopper's pts?

## 2016-08-24 NOTE — Telephone Encounter (Signed)
Needs OV for refills.  Refill request in August from Dr. Linna Darner noted: "Advised pt of RF and that he is due to be seen before any more RFs can be given. Pt agreed and I advised of Dr Hopper's very limited schedule and that he may want to est care with another provider who will be here more often. "

## 2016-08-27 ENCOUNTER — Other Ambulatory Visit: Payer: Self-pay

## 2016-08-27 NOTE — Telephone Encounter (Signed)
1 month. Please call in.  No more refills without office visit. Philis Fendt, MS, PA-C 1:45 PM, 08/27/2016

## 2016-08-27 NOTE — Telephone Encounter (Signed)
Last refill 03/2016 Last ov and labs 01/2016

## 2016-08-28 NOTE — Telephone Encounter (Signed)
Called to pharm, needs ov

## 2016-08-28 NOTE — Telephone Encounter (Signed)
Tramadol

## 2016-09-09 ENCOUNTER — Other Ambulatory Visit: Payer: Self-pay | Admitting: Physician Assistant

## 2016-09-09 DIAGNOSIS — I10 Essential (primary) hypertension: Secondary | ICD-10-CM

## 2016-09-09 DIAGNOSIS — E785 Hyperlipidemia, unspecified: Secondary | ICD-10-CM

## 2016-09-30 ENCOUNTER — Other Ambulatory Visit: Payer: Self-pay | Admitting: Physician Assistant

## 2016-09-30 DIAGNOSIS — I10 Essential (primary) hypertension: Secondary | ICD-10-CM

## 2016-10-16 ENCOUNTER — Ambulatory Visit (INDEPENDENT_AMBULATORY_CARE_PROVIDER_SITE_OTHER): Payer: BLUE CROSS/BLUE SHIELD | Admitting: Cardiothoracic Surgery

## 2016-10-16 ENCOUNTER — Encounter: Payer: Self-pay | Admitting: Cardiothoracic Surgery

## 2016-10-16 VITALS — BP 160/87 | HR 76 | Resp 16 | Ht 76.0 in | Wt 340.0 lb

## 2016-10-16 DIAGNOSIS — I77810 Thoracic aortic ectasia: Secondary | ICD-10-CM | POA: Diagnosis not present

## 2016-10-16 DIAGNOSIS — Z951 Presence of aortocoronary bypass graft: Secondary | ICD-10-CM | POA: Diagnosis not present

## 2016-10-16 DIAGNOSIS — Z09 Encounter for follow-up examination after completed treatment for conditions other than malignant neoplasm: Secondary | ICD-10-CM | POA: Diagnosis not present

## 2016-10-16 NOTE — Progress Notes (Signed)
[name] [place] Medical Record [id number] Date of Birth: Jan 27, 1961  Dr  Keturah Barre Roselie Awkward, MD  Chief Complaint:   PostOp Follow Up Visit 05/07/2012  OPERATIVE REPORT  PREOPERATIVE DIAGNOSIS: Dilated aortic root.  POSTOPERATIVE DIAGNOSIS: Dilated aortic root.  SURGICAL PROCEDURE: Valve-sparing aortic root replacement and coronary  artery bypass grafting x1 with second reverse saphenous vein graft to  the distal right coronary artery with right thigh endo vein harvesting.    History of Present Illness:     Patient continues to work full-time. He does note whole-body aching spacing when he first gets up in the morning . He denies symptoms of heart failure. He notes that he has gained weight but would like to get back on a better diet. Last echo was 11/2015 with stable mild AI. Comes in todayfollow up aortic root and ascending aortic replacement 04/2012.     History  Smoking Status  . Former Smoker  . Packs/day: 1.00  . Years: 13.00  . Types: Cigarettes  . Quit date: 01/23/1988  Smokeless Tobacco  . Never Used       Allergies  Allergen Reactions  . Iodinated Diagnostic Agents Hives    Hives started 4 days after cta chest was performed, minimal relief w/ benadryl x 3 days, uncertain if reaction was actually iv contrast or other allergen,allergy tests to follow.wife will make [place] aware of results//[email or web address]    Current Outpatient Prescriptions  Medication Sig Dispense Refill  . acetaminophen (TYLENOL) 500 MG tablet TAKE 1-2 TABS EVERY 6 HOURS AS NEEDED FOR PAIN    . aspirin 81 MG tablet Take 81 mg by mouth daily.    Marland Kitchen atorvastatin (LIPITOR) 40 MG tablet TAKE 1 TABLET BY MOUTH DAILY 90 tablet 0  . carvedilol (COREG) 12.5 MG tablet TAKE 1 TABLET BY MOUTH TWICE DAILY 180 tablet 0  . Diclofenac Sodium 3 % GEL Rub about 1 inch of gel  into each knee 2-3 times daily 100 g 3  . diphenhydrAMINE (BENADRYL) 25 mg capsule Take 2 capsules (50 mg total) by mouth every 6 (six) hours as needed. Take 2 capsules at 8 am on Monday morning 10/15/15 2 capsule 0  . felodipine (PLENDIL) 10 MG 24 hr tablet TAKE ONE TABLET BY MOUTH DAILY 30 tablet 4  . felodipine (PLENDIL) 10 MG 24 hr tablet Take 1 tablet (10 mg total) by mouth daily. 30 tablet 0  . furosemide (LASIX) 40 MG tablet TAKE 1 TABLET BY MOUTH DAILY 90 tablet 0  . lisinopril (PRINIVIL,ZESTRIL) 20 MG tablet Take 1 tablet (20 mg total) by mouth daily. 90 tablet 3  . loratadine (CLARITIN) 10 MG tablet Take 10 mg by mouth daily.    . metFORMIN (GLUCOPHAGE) 1000 MG tablet Take 1 tablet (1,000 mg total) by mouth 2 (two) times daily. Take 2 tablets by mouth twice daily. 180 tablet 3  . tamsulosin (FLOMAX) 0.4 MG CAPS capsule Take 2 capsules (0.8 mg total) by mouth daily after supper. 180 capsule 3  . traMADol (ULTRAM) 50 MG tablet Take 1 tablet (50 mg total) by mouth every 8 (eight)  hours as needed. 30 tablet 0   No current facility-administered medications for this visit.        Physical Exam: BP (!) 160/87 (BP Location: Right Arm, Patient Position: Sitting, Cuff Size: Large)   Pulse 76   Resp 16   Ht 6\' 4"  (1.93 m)   Wt (!) 340 lb (154.2 kg)   SpO2 96% Comment: RA  BMI 41.39 kg/m   General appearance: alert and cooperative Neurologic: intact Heart: regular rate and rhythm, S1, S2 normal, no murmur- I have heard in the past murmur of mild aortic insufficiency but do not appreciated today, click, rub or gallop, normal apical impulse, no rub and no murmur of AI Lungs: clear to auscultation bilaterally and normal percussion bilaterally Abdomen: soft, non-tender; bowel sounds normal; no masses,  no organomegaly Extremities: extremities normal, atraumatic, no cyanosis or edema and Homans sign is negative, no sign of DVT Wound: sternum stable, still has significant keloid which is  unchanged   Diagnostic Studies & Laboratory data:          Recent Radiology Findings:  Ct Angio Chest Aorta W/cm &/or Wo/cm  10/15/2015  CLINICAL DATA:  Status post aortic root replacement and coronary bypass EXAM: CT ANGIOGRAPHY CHEST WITH CONTRAST TECHNIQUE: Multidetector CT imaging of the chest was performed using the standard protocol during bolus administration of intravenous contrast. Multiplanar CT image reconstructions and MIPs were obtained to evaluate the vascular anatomy. CONTRAST:  75 cc Isovue 370 COMPARISON:  06/17/2013 FINDINGS: Mediastinum/Lymph Nodes: Patient is status post median sternotomy. Prior coronary bypass and aortic root repair noted. No aneurysm, dissection, mediastinal hemorrhage, or hematoma. Stable ectasia of the coronary origins, as before. Visualized central pulmonary arteries appear patent. No significant proximal filling defect or pulmonary embolus demonstrated. Native coronary atherosclerosis noted. Normal heart size. No pericardial or pleural effusion. No adenopathy. Lungs/Pleura: Lungs remain clear. No focal pneumonia, collapse or consolidation. Stable calcified left lower lobe granuloma and left hilar lymph node. No interstitial process or edema. No pneumothorax. Trachea and central airways are patent. Upper abdomen: Splenic punctate calcified granulomas evident. No acute upper abdominal finding. Colonic diverticulosis noted. Musculoskeletal: Minor degenerative endplate changes of the thoracic spine. No compression fracture. Prior median sternotomy. No acute osseous finding. Review of the MIP images confirms the above findings. IMPRESSION: Stable postoperative changes of the thoracic aorta. No acute intra thoracic process or interval change. Remote granulomatous disease. Electronically Signed   By: Jerilynn Mages.  Shick M.D.   On: 10/15/2015 10:03   Ct Angio Chest Aorta W/cm &/or Wo/cm  06/17/2013   *RADIOLOGY REPORT*  Clinical Data: Follow-up status post aortic root  replacement  CT ANGIOGRAPHY CHEST  Technique:  Multidetector CT imaging of the chest using the standard protocol during bolus administration of intravenous contrast. Multiplanar reconstructed images including MIPs were obtained and reviewed to evaluate the vascular anatomy.  Contrast: 53mL OMNIPAQUE IOHEXOL 350 MG/ML SOLN  Comparison: Most recent prior CTA of the chest 09/25/2011; most recent prior chest x-ray 10/07/2012  Findings:  Mediastinum: Unremarkable CT appearance of the thyroid gland.  No suspicious mediastinal or hilar adenopathy.  No soft tissue mediastinal mass.  The thoracic esophagus is unremarkable.  Heart/Vascular:  Status post median sternotomy with evidence of valve sparing tube graft replacement of the ascending aorta and the aortic to RCA saphenous vein graft.  The vein graft opacifies with contrast material.  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  This is unchanged compared to prior.  The aortic anastomosis is intact.  No evidence of periaortic fluid, or hematoma.  The heart is within normal limits for size.  No pericardial effusion.  Atherosclerotic vascular calcifications noted in the left anterior descending coronary artery.  Lungs/Pleura: Stable calcified granuloma in the inferior left lower lobe.  The lungs are otherwise clear.  Upper Abdomen: Scattered punctate calcifications throughout the spleen consistent with the sequela of remote granulomatous disease. Otherwise, visualization of the upper abdomen is unremarkable.  Bones: No acute fracture or aggressive appearing lytic or blastic osseous lesion.  Healed median sternotomy.  Sternotomy wires are intact.  IMPRESSION:  1. Compared to the prior chest CTA from November 2012 there are interval surgical changes of repair of the ascending aorta with a straight tube graft as well as single vessel CABG to the right coronary artery.  No evidence of complication including recurrent aneurysmal dilatation,  dissection, leak or mediastinal hematoma. The visualized portion of the aortic to RCA saphenous vein graft opacifies with contrast material.  2.  Sequela of remote granulomatous disease.   Original Report Authenticated By: Jacqulynn Cadet, M.D.     ECHO:2/17 Echocardiography  Patient:    Jonathan Miles, Jonathan Miles MR #:       [id number] Study Date: 12/14/2015 Gender:     M Age:        25 Height:     193 cm Weight:     153.3 kg BSA:        2.93 m^2 Pt. Status: Room:   Elenore Paddy, M.D.  REFERRING    Loralie Champagne, M.D.  SONOGRAPHER  [place], RDCS  ATTENDING    Ena Dawley, M.D.  PERFORMING   [facility], Outpatient  cc:  ------------------------------------------------------------------- LV EF: 60% -   65%  ------------------------------------------------------------------- Indications:      Aortic root dilation (I77.810).  ------------------------------------------------------------------- History:   PMH:   Dyspnea.  Coronary artery disease.  Congestive heart failure.  Risk factors:  OSA. Former tobacco use. Hypertension. Dyslipidemia.  ------------------------------------------------------------------- Study Conclusions  - Left ventricle: The cavity size was normal. There was moderate   focal basal and mild concentric hypertrophy of the left   ventricle. Systolic function was normal. The estimated ejection   fraction was in the range of 60% to 65%. Wall motion was normal;   there were no regional wall motion abnormalities. Features are   consistent with a pseudonormal left ventricular filling pattern,   with concomitant abnormal relaxation and increased filling   pressure (grade 2 diastolic dysfunction). Doppler parameters are   consistent with elevated ventricular end-diastolic filling   pressure. - Aortic valve: There was mild to moderate regurgitation. - Ascending aorta: The ascending aorta was mildly dilated measuring   44 mm. - Mitral valve:  There was mild regurgitation. - Left atrium: The atrium was moderately dilated. - Right atrium: The atrium was mildly dilated. - Pulmonary arteries: Systolic pressure was mildly increased. PA   peak pressure: 37 mm Hg (S).  Impressions:  - There is no significant difference when compared to the prior   study from 12/11/2014, ascending aortic aneurysm is mildly   enlarged, now 44 mm, previously 43 mm.  ------------------------------------------------------------------- Labs, prior tests, procedures, and surgery: Transthoracic echocardiography (12/11/2014).     EF was 60%.  Echocardiography.  M-mode, complete 2D, spectral Doppler, and color Doppler.  Birthdate:  Patient birthdate: 1961/07/01.  Age:  Patient is 55 yr old.  Sex:  Gender: male.    BMI: 41.1 kg/m^2.  Blood  pressure:     152/90  Patient status:  Outpatient.  Study date: Study date: 12/14/2015. Study time: 09:38 AM.  Location:  [facility]  -------------------------------------------------------------------  ------------------------------------------------------------------- Left ventricle:  The cavity size was normal. There was moderate focal basal and mild concentric hypertrophy of the left ventricle. Systolic function was normal. The estimated ejection fraction was in the range of 60% to 65%. Wall motion was normal; there were no regional wall motion abnormalities. Features are consistent with a pseudonormal left ventricular filling pattern, with concomitant abnormal relaxation and increased filling pressure (grade 2 diastolic dysfunction). Doppler parameters are consistent with elevated ventricular end-diastolic filling pressure.  ------------------------------------------------------------------- Aortic valve:   Trileaflet; normal thickness leaflets. Mobility was not restricted.  Doppler:  Transvalvular velocity was within the normal range. There was no stenosis. There was mild to  moderate regurgitation.  ------------------------------------------------------------------- Aorta:  Aortic root: The aortic root was normal in size. Ascending aorta: The ascending aorta was mildly dilated measuring 44 mm.  ------------------------------------------------------------------- Mitral valve:   Mildly thickened leaflets . Mobility was not restricted.  Doppler:  Transvalvular velocity was within the normal range. There was no evidence for stenosis. There was mild regurgitation.    Peak gradient (D): 3 mm Hg.  ------------------------------------------------------------------- Left atrium:  The atrium was moderately dilated.  ------------------------------------------------------------------- Right ventricle:  The cavity size was normal. Wall thickness was normal. Systolic function was normal.  ------------------------------------------------------------------- Pulmonic valve:    Structurally normal valve.   Cusp separation was normal.  Doppler:  Transvalvular velocity was within the normal range. There was no evidence for stenosis. There was no regurgitation.  ------------------------------------------------------------------- Tricuspid valve:   Structurally normal valve.    Doppler: Transvalvular velocity was within the normal range. There was mild regurgitation.  ------------------------------------------------------------------- Pulmonary artery:   The main pulmonary artery was normal-sized. Systolic pressure was mildly increased.  ------------------------------------------------------------------- Right atrium:  The atrium was mildly dilated.  ------------------------------------------------------------------- Pericardium:  There was no pericardial effusion.  ------------------------------------------------------------------- Systemic veins: Inferior vena cava: The vessel was normal in  size.  ------------------------------------------------------------------- Measurements   Left ventricle                           Value        Reference  LV ID, ED, PLAX chordal          (H)     53.5  mm     43 - 52  LV ID, ES, PLAX chordal                  28    mm     23 - 38  LV fx shortening, PLAX chordal           48    %      >=29  LV PW thickness, ED                      11.6  mm     ---------  IVS/LV PW ratio, ED                      1.13         <=1.3  Stroke volume, 2D                        158   ml     ---------  Stroke volume/bsa, 2D  54    ml/m^2 ---------  LV ejection fraction, 1-p A4C            55    %      ---------  LV end-diastolic volume, 2-p             96    ml     ---------  LV end-systolic volume, 2-p              42    ml     ---------  LV ejection fraction, 2-p                56    %      ---------  Stroke volume, 2-p                       53    ml     ---------  LV end-diastolic volume/bsa, 2-p         33    ml/m^2 ---------  LV end-systolic volume/bsa, 2-p          14    ml/m^2 ---------  Stroke volume/bsa, 2-p                   18.1  ml/m^2 ---------  LV e&', lateral                           7.51  cm/s   ---------  LV E/e&', lateral                         12.21        ---------  LV e&', medial                            7.51  cm/s   ---------  LV E/e&', medial                          12.21        ---------  LV e&', average                           7.51  cm/s   ---------  LV E/e&', average                         12.21        ---------    Ventricular septum                       Value        Reference  IVS thickness, ED                        13.1  mm     ---------    LVOT                                     Value        Reference  LVOT ID, S                               26  mm     ---------  LVOT area                                5.31  cm^2   ---------  LVOT peak velocity, S                    120   cm/s   ---------  LVOT  mean velocity, S                    84.4  cm/s   ---------  LVOT VTI, S                              29.7  cm     ---------  LVOT peak gradient, S                    6     mm Hg  ---------    Aorta                                    Value        Reference  Aortic root ID, ED                       39    mm     ---------  Ascending aorta ID, A-P, S               44    mm     ---------    Left atrium                              Value        Reference  LA ID, A-P, ES                           44    mm     ---------  LA ID/bsa, A-P                           1.5   cm/m^2 <=2.2  LA volume, S                             94    ml     ---------  LA volume/bsa, S                         32.1  ml/m^2 ---------  LA volume, ES, 1-p A4C                   111   ml     ---------  LA volume/bsa, ES, 1-p A4C               37.9  ml/m^2 ---------  LA volume, ES, 1-p A2C                   78.2  ml     ---------  LA volume/bsa, ES, 1-p A2C               26.7  ml/m^2 ---------    Mitral valve                             Value        Reference  Mitral E-wave peak velocity              91.7  cm/s   ---------  Mitral A-wave peak velocity              50.6  cm/s   ---------  Mitral deceleration time                 218   ms     150 - 230  Mitral peak gradient, D                  3     mm Hg  ---------  Mitral E/A ratio, peak                   1.8          ---------    Pulmonary arteries                       Value        Reference  PA pressure, S, DP               (H)     37    mm Hg  <=30    Tricuspid valve                          Value        Reference  Tricuspid regurg peak velocity           268   cm/s   ---------  Tricuspid peak RV-RA gradient            29    mm Hg  ---------    Systemic veins                           Value        Reference  Estimated CVP                            8     mm Hg  ---------    Right ventricle                          Value        Reference  RV pressure, S, DP                (H)     37    mm Hg  <=30  RV s&', lateral, S                        14.1  cm/s   ---------  Legend: (L)  and  (H)  mark values outside specified reference range.  ------------------------------------------------------------------- Prepared and Electronically Authenticated by  Ena Dawley, M.D. 2017-02-17T15:16:08     Recent Labs: Lab Results  Component Value Date   WBC 11.5 (A) 10/06/2015   HGB 15.3 10/06/2015   HCT 45.1 10/06/2015   PLT 324.0 12/04/2014   GLUCOSE 116 (H) 02/16/2016   CHOL 147 02/16/2016   TRIG 90  02/16/2016   HDL 37 (L) 02/16/2016   LDLCALC 92 02/16/2016   ALT 16 02/16/2016   AST 9 (L) 02/16/2016   NA 141 02/16/2016   K 4.0 02/16/2016   CL 104 02/16/2016   CREATININE 0.89 02/16/2016   BUN 14 02/16/2016   CO2 26 02/16/2016   INR 1.54 (H) 05/07/2012   HGBA1C 6.4 02/16/2016   Echo reviewed from 11/2014, mild ai with intact graft and leaflets   Assessment / Plan:   Patient doing well without heart failure symptoms now 5 years after ascending aortic replacement with valve sparing. Last echo shows stable mild aortic insufficiency He has follow-up appointment in the early part of 2018 with cardiology and follow-up echocardiogram. Plan to see him back in one year  Grace Isaac MD 10/16/2016 9:45 AM

## 2016-10-27 DIAGNOSIS — I639 Cerebral infarction, unspecified: Secondary | ICD-10-CM

## 2016-10-27 HISTORY — DX: Cerebral infarction, unspecified: I63.9

## 2016-10-31 ENCOUNTER — Ambulatory Visit (INDEPENDENT_AMBULATORY_CARE_PROVIDER_SITE_OTHER): Payer: BLUE CROSS/BLUE SHIELD

## 2016-10-31 ENCOUNTER — Ambulatory Visit (INDEPENDENT_AMBULATORY_CARE_PROVIDER_SITE_OTHER): Payer: BLUE CROSS/BLUE SHIELD | Admitting: Physician Assistant

## 2016-10-31 VITALS — BP 156/82 | HR 71 | Temp 98.7°F | Resp 16 | Ht 75.0 in | Wt 348.0 lb

## 2016-10-31 DIAGNOSIS — E785 Hyperlipidemia, unspecified: Secondary | ICD-10-CM

## 2016-10-31 DIAGNOSIS — M25561 Pain in right knee: Secondary | ICD-10-CM

## 2016-10-31 DIAGNOSIS — E119 Type 2 diabetes mellitus without complications: Secondary | ICD-10-CM | POA: Diagnosis not present

## 2016-10-31 DIAGNOSIS — I2510 Atherosclerotic heart disease of native coronary artery without angina pectoris: Secondary | ICD-10-CM

## 2016-10-31 DIAGNOSIS — I10 Essential (primary) hypertension: Secondary | ICD-10-CM | POA: Diagnosis not present

## 2016-10-31 DIAGNOSIS — G8929 Other chronic pain: Secondary | ICD-10-CM

## 2016-10-31 DIAGNOSIS — M170 Bilateral primary osteoarthritis of knee: Secondary | ICD-10-CM

## 2016-10-31 DIAGNOSIS — M25562 Pain in left knee: Secondary | ICD-10-CM | POA: Diagnosis not present

## 2016-10-31 DIAGNOSIS — N401 Enlarged prostate with lower urinary tract symptoms: Secondary | ICD-10-CM

## 2016-10-31 DIAGNOSIS — Z114 Encounter for screening for human immunodeficiency virus [HIV]: Secondary | ICD-10-CM

## 2016-10-31 DIAGNOSIS — Z1159 Encounter for screening for other viral diseases: Secondary | ICD-10-CM

## 2016-10-31 DIAGNOSIS — R351 Nocturia: Secondary | ICD-10-CM

## 2016-10-31 DIAGNOSIS — Z6841 Body Mass Index (BMI) 40.0 and over, adult: Secondary | ICD-10-CM

## 2016-10-31 MED ORDER — FELODIPINE ER 10 MG PO TB24: 10.0000 mg | ORAL_TABLET | Freq: Every day | ORAL | 3 refills | Status: DC

## 2016-10-31 MED ORDER — METFORMIN HCL 1000 MG PO TABS: 1000.0000 mg | ORAL_TABLET | Freq: Two times a day (BID) | ORAL | 3 refills | Status: DC

## 2016-10-31 MED ORDER — LISINOPRIL 20 MG PO TABS: 20.0000 mg | ORAL_TABLET | Freq: Every day | ORAL | 3 refills | Status: DC

## 2016-10-31 MED ORDER — FUROSEMIDE 40 MG PO TABS: 40.0000 mg | ORAL_TABLET | Freq: Every day | ORAL | 3 refills | Status: DC

## 2016-10-31 MED ORDER — CARVEDILOL 12.5 MG PO TABS: 12.5000 mg | ORAL_TABLET | Freq: Two times a day (BID) | ORAL | 3 refills | Status: DC

## 2016-10-31 MED ORDER — TAMSULOSIN HCL 0.4 MG PO CAPS: 0.8000 mg | ORAL_CAPSULE | Freq: Every day | ORAL | 3 refills | Status: DC

## 2016-10-31 MED ORDER — TRAMADOL HCL 50 MG PO TABS: 100.0000 mg | ORAL_TABLET | Freq: Four times a day (QID) | ORAL | 0 refills | Status: DC | PRN

## 2016-10-31 MED ORDER — ATORVASTATIN CALCIUM 40 MG PO TABS: 40.0000 mg | ORAL_TABLET | Freq: Every day | ORAL | 3 refills | Status: DC

## 2016-10-31 MED ORDER — DICLOFENAC SODIUM 3 % TD GEL: TRANSDERMAL | 3 refills | Status: DC

## 2016-10-31 NOTE — Progress Notes (Signed)
Patient ID: Jonathan Miles, male    DOB: 28-Jan-1961, 56 y.o.   MRN: [id number]  PCP: Ruben Reason, MD  Chief Complaint  Patient presents with  . Medication Refill    all med  . Joint pain    x 2 weeks     Subjective:   Presents for evaluation of his chronic medical problems and knee pain.  Has not established with new PCP since Dr. Linna Darner retired. A1C 01/2016 6.4%  PSA 2016 3.97, 01/2016 3.89 Nocturia continues, despite increase in tamsulosin.  Cramping in the thighs. "Probably from the statin." Not intolerable. Not worsening.  Known early arthritis in the knees, has been getting a lot worse. "To the point that I just can't take it." Tramadol 100 mg helps some, and allows him to sleep at night. Ibuprofen helps the best, but he is not supposed to take it due to ASCVD and ASA use. Used diclofenac gel, helped some, not as much as the tramadol, and he's out. Theragesic smells bad, helps some Using his wife's lidocaine 5% topical, helps some temporarily. He's had an injection in the LEFT knee. Dr. Gladstone Lighter performed a scope of the LEFT knee.  Has gained some weight in the past year.  Colonoscopy in 2012 in [place]. No documentation in the record. Does not recall the name of the practice. Care Everywhere reveals a Small Bowel Series with [facility] in 2011. The ordering provider's name is not visible to me.  Review of Systems  Constitutional: Negative for activity change, appetite change, fatigue and unexpected weight change.  HENT: Negative for congestion, dental problem, ear pain, hearing loss, mouth sores, postnasal drip, rhinorrhea, sneezing, sore throat, tinnitus and trouble swallowing.   Eyes: Negative for photophobia, pain, redness and visual disturbance.  Respiratory: Negative for cough, chest tightness and shortness of breath.   Cardiovascular: Negative for chest pain, palpitations and leg swelling.  Gastrointestinal: Negative for abdominal pain, blood in stool,  constipation, diarrhea, nausea and vomiting.  Genitourinary: Positive for frequency (nocturia, no daytime frequency). Negative for dysuria, hematuria and urgency.  Musculoskeletal: Positive for arthralgias and gait problem (due to knee pain). Negative for myalgias and neck stiffness.  Skin: Negative for rash.  Neurological: Negative for dizziness, speech difficulty, weakness, light-headedness, numbness and headaches.  Hematological: Negative for adenopathy.  Psychiatric/Behavioral: Negative for confusion and sleep disturbance. The patient is not nervous/anxious.        Patient Active Problem List   Diagnosis Date Noted  . Aortic insufficiency 11/23/2012  . Chronic diastolic CHF (congestive heart failure) ([place]) 11/23/2012  . CAD (coronary artery disease) 11/27/2011  . OSA (obstructive sleep apnea) 11/11/2011  . Aortic root dilatation ([place])   . Chest pain 01/23/2011  . Hyperlipidemia 01/23/2011  . Hypertension 01/23/2011     Prior to Admission medications   Medication Sig Start Date End Date Taking? Authorizing Provider  acetaminophen (TYLENOL) 500 MG tablet TAKE 1-2 TABS EVERY 6 HOURS AS NEEDED FOR PAIN 05/26/12  Yes Liliane Shi, PA-C  aspirin 81 MG tablet Take 81 mg by mouth daily.   Yes Historical Provider, MD  atorvastatin (LIPITOR) 40 MG tablet TAKE 1 TABLET BY MOUTH DAILY 09/10/16  Yes Larey Dresser, MD  carvedilol (COREG) 12.5 MG tablet TAKE 1 TABLET BY MOUTH TWICE DAILY 10/01/16  Yes Liliane Shi, PA-C  Diclofenac Sodium 3 % GEL Rub about 1 inch of gel into each knee 2-3 times daily 12/10/13  Yes Posey Boyer, MD  diphenhydrAMINE (BENADRYL) 25 mg  capsule Take 2 capsules (50 mg total) by mouth every 6 (six) hours as needed. Take 2 capsules at 8 am on Monday morning 10/15/15 10/11/15  Yes Grace Isaac, MD  felodipine (PLENDIL) 10 MG 24 hr tablet TAKE ONE TABLET BY MOUTH DAILY 06/18/16  Yes Larey Dresser, MD  furosemide (LASIX) 40 MG tablet TAKE 1 TABLET BY MOUTH  DAILY 09/10/16  Yes Larey Dresser, MD  lisinopril (PRINIVIL,ZESTRIL) 20 MG tablet Take 1 tablet (20 mg total) by mouth daily. 07/12/15  Yes Scott Joylene Draft, PA-C  loratadine (CLARITIN) 10 MG tablet Take 10 mg by mouth daily.   Yes Historical Provider, MD  metFORMIN (GLUCOPHAGE) 1000 MG tablet Take 1 tablet (1,000 mg total) by mouth 2 (two) times daily. Take 2 tablets by mouth twice daily. 02/16/16  Yes Posey Boyer, MD  tamsulosin (FLOMAX) 0.4 MG CAPS capsule Take 2 capsules (0.8 mg total) by mouth daily after supper. 02/16/16  Yes Posey Boyer, MD  traMADol (ULTRAM) 50 MG tablet Take 1 tablet (50 mg total) by mouth every 8 (eight) hours as needed. 06/01/16  Yes Posey Boyer, MD     Allergies  Allergen Reactions  . Iodinated Diagnostic Agents Hives    Hives started 4 days after cta chest was performed, minimal relief w/ benadryl x 3 days, uncertain if reaction was actually iv contrast or other allergen,allergy tests to follow.wife will make [place] aware of results//a.calhoun       Objective:  Physical Exam  Constitutional: He is oriented to person, place, and time. He appears well-developed and well-nourished. He is active and cooperative. No distress.  BP (!) 156/82 (BP Location: Left Arm, Patient Position: Sitting, Cuff Size: Large)   Pulse 71   Temp 98.7 F (37.1 C) (Oral)   Resp 16   Ht 6\' 3"  (1.905 m)   Wt (!) 348 lb (157.9 kg)   SpO2 95%   BMI 43.50 kg/m   HENT:  Head: Normocephalic and atraumatic.  Right Ear: Hearing normal.  Left Ear: Hearing normal.  Eyes: Conjunctivae are normal. No scleral icterus.  Neck: Normal range of motion. Neck supple. No thyromegaly present.  Cardiovascular: Normal rate, regular rhythm and normal heart sounds.   Pulses:      Radial pulses are 2+ on the right side, and 2+ on the left side.  Pulmonary/Chest: Effort normal and breath sounds normal.  Musculoskeletal:       Right knee: He exhibits normal range of motion, no swelling, no effusion, no  erythema, no LCL laxity, normal patellar mobility and no MCL laxity. Tenderness found. Patellar tendon tenderness noted. No medial joint line, no lateral joint line, no MCL and no LCL tenderness noted.       Left knee: He exhibits normal range of motion, no swelling and no erythema. Tenderness found. Medial joint line, lateral joint line and patellar tendon tenderness noted.  Lymphadenopathy:       Head (right side): No tonsillar, no preauricular, no posterior auricular and no occipital adenopathy present.       Head (left side): No tonsillar, no preauricular, no posterior auricular and no occipital adenopathy present.    He has no cervical adenopathy.       Right: No supraclavicular adenopathy present.       Left: No supraclavicular adenopathy present.  Neurological: He is alert and oriented to person, place, and time. No sensory deficit.  Skin: Skin is warm, dry and intact. No rash noted. No cyanosis  or erythema. Nails show no clubbing.  Psychiatric: He has a normal mood and affect. His speech is normal and behavior is normal.     Wt Readings from Last 3 Encounters:  10/31/16 (!) 348 lb (157.9 kg)  10/16/16 (!) 340 lb (154.2 kg)  02/16/16 (!) 333 lb 3.2 oz (151.1 kg)   Dg Knee 1-2 Views Left  Result Date: 10/31/2016 CLINICAL DATA:  Bilateral knee pain. EXAM: LEFT KNEE - 1-2 VIEW COMPARISON:  12/17/9 FINDINGS: There is no joint effusion. Moderate tricompartment joint space narrowing and marginal spur formation noted. There is no fracture or subluxation. IMPRESSION: 1. Moderate tricompartment osteoarthritis. Electronically Signed   By: Kerby Moors M.D.   On: 10/31/2016 18:04   Dg Knee 1-2 Views Right  Result Date: 10/31/2016 CLINICAL DATA:  Pain, progressing EXAM: RIGHT KNEE - 1-2 VIEW COMPARISON:  None FINDINGS: Frontal and lateral views were obtained. There is no fracture or dislocation. There is fairly marked narrowing of the patellofemoral joint. There is moderate narrowing medially.  There is spurring in all compartments. No erosive change. There are surgical clips medial to the proximal tibia. IMPRESSION: Osteoarthritic change, most marked in the patellofemoral joint. No fracture or joint effusion evident. Electronically Signed   By: Lowella Grip III M.D.   On: 10/31/2016 18:02       Assessment & Plan:   1. Type 2 diabetes mellitus without complication, without long-term current use of insulin ([place]) Needs updated diabetic eye exam. Await lab results. Weight gain suggests the control will be less good than previously. - HM DIABETES EYE EXAM - HM DIABETES FOOT EXAM - Comprehensive metabolic panel - Microalbumin, urine - Hemoglobin A1c - metFORMIN (GLUCOPHAGE) 1000 MG tablet; Take 1 tablet (1,000 mg total) by mouth 2 (two) times daily. Take 2 tablets by mouth twice daily.  Dispense: 180 tablet; Refill: 3  2. Essential hypertension Above goal today, very likely due to pain. Refill meds. Re-evaluate in 3 months, and increase treatment if remains elevated. - Comprehensive metabolic panel - CBC with Differential/Platelet - TSH - lisinopril (PRINIVIL,ZESTRIL) 20 MG tablet; Take 1 tablet (20 mg total) by mouth daily.  Dispense: 90 tablet; Refill: 3 - furosemide (LASIX) 40 MG tablet; Take 1 tablet (40 mg total) by mouth daily.  Dispense: 90 tablet; Refill: 3 - carvedilol (COREG) 12.5 MG tablet; Take 1 tablet (12.5 mg total) by mouth 2 (two) times daily.  Dispense: 180 tablet; Refill: 3  3. Coronary artery disease involving native coronary artery of native heart without angina pectoris Contact [facility] to schedule follow up with Dr. Aundra Dubin.  4. Hyperlipidemia, unspecified hyperlipidemia type Await lab results. - Comprehensive metabolic panel - atorvastatin (LIPITOR) 40 MG tablet; Take 1 tablet (40 mg total) by mouth daily.  Dispense: 90 tablet; Refill: 3  5. Screening for HIV (human immunodeficiency virus) - HIV antibody  6. Encounter for hepatitis C screening  test for low risk patient -Hep C ab  7. Chronic pain of both knees Refilled tramadol and diclofenac gel. Follow-up with Dr. Gladstone Lighter for more definitive care. - DG Knee 1-2 Views Left; Future - DG Knee 1-2 Views Right; Future - Ambulatory referral to Orthopedic Surgery - Diclofenac Sodium 3 % GEL; Rub about 1 inch of gel into each knee 2-3 times daily  Dispense: 100 g; Refill: 3 - traMADol (ULTRAM) 50 MG tablet; Take 2 tablets (100 mg total) by mouth every 6 (six) hours as needed.  Dispense: 90 tablet; Refill: 0  8. BPH associated with  nocturia Persistent symptoms despite tamsulosin. - tamsulosin (FLOMAX) 0.4 MG CAPS capsule; Take 2 capsules (0.8 mg total) by mouth daily after supper.  Dispense: 180 capsule; Refill: 3 - Ambulatory referral to Urology  9. BMI 40.0-44.9, adult (HCC) WEight gain contributing to multiple issues for him.  - Amb Ref to Medical Weight Management  Return in about 3 months (around 01/29/2017) for re-evaluation of diabetes.   Fara Chute, PA-C Physician Assistant-Certified [facility] at [place]

## 2016-10-31 NOTE — Patient Instructions (Addendum)
Please call Dr. Claris Gladden office to schedule a follow-up with him.    IF you received an x-ray today, you will receive an invoice from [facility]. Please contact [facility] at [phone] with questions or concerns regarding your invoice.   IF you received labwork today, you will receive an invoice from [facility]. Please contact [facility] at [phone] with questions or concerns regarding your invoice.   Our billing staff will not be able to assist you with questions regarding bills from these companies.  You will be contacted with the lab results as soon as they are available. The fastest way to get your results is to activate your My Chart account. Instructions are located on the last page of this paperwork. If you have not heard from [place] regarding the results in 2 weeks, please contact this office.

## 2016-11-01 ENCOUNTER — Encounter: Payer: Self-pay | Admitting: Physician Assistant

## 2016-11-01 ENCOUNTER — Telehealth: Payer: Self-pay

## 2016-11-01 DIAGNOSIS — E119 Type 2 diabetes mellitus without complications: Secondary | ICD-10-CM

## 2016-11-01 LAB — CBC WITH DIFFERENTIAL/PLATELET
Basophils Absolute: 0.1 10*3/uL (ref 0.0–0.2)
Basos: 1 %
EOS (ABSOLUTE): 0.3 10*3/uL (ref 0.0–0.4)
Eos: 3 %
Hematocrit: 40.3 % (ref 37.5–51.0)
Hemoglobin: 14 g/dL (ref 13.0–17.7)
Immature Grans (Abs): 0 10*3/uL (ref 0.0–0.1)
Immature Granulocytes: 0 %
Lymphocytes Absolute: 1.6 10*3/uL (ref 0.7–3.1)
Lymphs: 17 %
MCH: 28.5 pg (ref 26.6–33.0)
MCHC: 34.7 g/dL (ref 31.5–35.7)
MCV: 82 fL (ref 79–97)
Monocytes Absolute: 1 10*3/uL — ABNORMAL HIGH (ref 0.1–0.9)
Monocytes: 10 %
Neutrophils Absolute: 6.8 10*3/uL (ref 1.4–7.0)
Neutrophils: 69 %
Platelets: 307 10*3/uL (ref 150–379)
RBC: 4.91 x10E6/uL (ref 4.14–5.80)
RDW: 14.7 % (ref 12.3–15.4)
WBC: 9.8 10*3/uL (ref 3.4–10.8)

## 2016-11-01 LAB — COMPREHENSIVE METABOLIC PANEL
ALT: 20 IU/L (ref 0–44)
AST: 13 IU/L (ref 0–40)
Albumin/Globulin Ratio: 1.4 (ref 1.2–2.2)
Albumin: 4.1 g/dL (ref 3.5–5.5)
Alkaline Phosphatase: 77 IU/L (ref 39–117)
BUN/Creatinine Ratio: 19 (ref 9–20)
BUN: 16 mg/dL (ref 6–24)
Bilirubin Total: 0.3 mg/dL (ref 0.0–1.2)
CO2: 26 mmol/L (ref 18–29)
Calcium: 10 mg/dL (ref 8.7–10.2)
Chloride: 100 mmol/L (ref 96–106)
Creatinine, Ser: 0.85 mg/dL (ref 0.76–1.27)
GFR calc Af Amer: 113 mL/min/{1.73_m2} (ref >59)
GFR calc non Af Amer: 97 mL/min/{1.73_m2} (ref >59)
Globulin, Total: 2.9 g/dL (ref 1.5–4.5)
Glucose: 134 mg/dL — ABNORMAL HIGH (ref 65–99)
Potassium: 4 mmol/L (ref 3.5–5.2)
Sodium: 141 mmol/L (ref 134–144)
Total Protein: 7 g/dL (ref 6.0–8.5)

## 2016-11-01 LAB — HIV ANTIBODY (ROUTINE TESTING W REFLEX): HIV Screen 4th Generation wRfx: NONREACTIVE

## 2016-11-01 LAB — TSH: TSH: 1.39 u[IU]/mL (ref 0.450–4.500)

## 2016-11-01 LAB — MICROALBUMIN, URINE: Microalbumin, Urine: 18.4 ug/mL

## 2016-11-01 LAB — HEMOGLOBIN A1C
Est. average glucose Bld gHb Est-mCnc: 177 mg/dL
Hgb A1c MFr Bld: 7.8 % — ABNORMAL HIGH (ref 4.8–5.6)

## 2016-11-01 MED ORDER — METFORMIN HCL 1000 MG PO TABS: ORAL_TABLET | ORAL | 3 refills | Status: DC

## 2016-11-01 NOTE — Telephone Encounter (Signed)
Confirmed with pt. 1000mg  bid

## 2016-11-01 NOTE — Telephone Encounter (Signed)
lmtcb and clarify dose of metformin. Pharmacy has rx with 2 different directions on rx

## 2016-11-02 MED ORDER — DAPAGLIFLOZIN PROPANEDIOL 5 MG PO TABS: 5.0000 mg | ORAL_TABLET | Freq: Every day | ORAL | 3 refills | Status: DC

## 2016-11-02 NOTE — Addendum Note (Signed)
Addended by: Fara Chute on: 11/02/2016 05:05 PM   Modules accepted: Orders

## 2016-11-04 ENCOUNTER — Telehealth: Payer: Self-pay | Admitting: Physician Assistant

## 2016-11-04 NOTE — Telephone Encounter (Signed)
Intended dose is 1000 mg BID

## 2016-11-10 ENCOUNTER — Telehealth: Payer: Self-pay

## 2016-11-10 NOTE — Telephone Encounter (Signed)
Pa for diclofenac gel in chelles box to complete her part.

## 2016-11-10 NOTE — Telephone Encounter (Signed)
PA form completed and placed in TO BE FAXED bin in provider lounge.

## 2016-11-10 NOTE — Telephone Encounter (Signed)
covermy meds key #[id number]

## 2016-11-14 NOTE — Telephone Encounter (Signed)
form faxed to [facility]

## 2016-11-15 ENCOUNTER — Encounter (INDEPENDENT_AMBULATORY_CARE_PROVIDER_SITE_OTHER): Payer: BLUE CROSS/BLUE SHIELD | Admitting: Family Medicine

## 2016-11-17 ENCOUNTER — Telehealth: Payer: Self-pay

## 2016-11-17 NOTE — Telephone Encounter (Signed)
Diclofenac Sodium 3 % GEL  Prior authorized needed on the above medication.  Insurance won't cover it as is.  [phone] (H)  Ricky Stabs provider

## 2016-11-17 NOTE — Telephone Encounter (Signed)
See next note in process for 1% 3% not covered

## 2016-11-17 NOTE — Telephone Encounter (Signed)
1% may be covered 3% denied  Diclofenac gel In review

## 2016-11-17 NOTE — Telephone Encounter (Signed)
New form completed. This patient is using this product for OA, not a dermatologic purpose. Placed in to be faxed box in provider lounge.

## 2016-11-18 NOTE — Telephone Encounter (Signed)
Diclofenac gel 1% approved 11/14/16-10/26/38  Please resend an rx for 1% if appropriate 3% not covered

## 2016-11-19 MED ORDER — DICLOFENAC SODIUM 1 % TD GEL: 2.0000 g | Freq: Four times a day (QID) | TRANSDERMAL | 1 refills | Status: DC

## 2016-11-19 NOTE — Telephone Encounter (Signed)
Meds ordered this encounter  Medications  . diclofenac sodium (VOLTAREN) 1 % GEL    Sig: Apply 2 g topically 4 (four) times daily.    Dispense:  300 g    Refill:  1    Order Specific Question:   Supervising Provider    Answer:   Brigitte Pulse, EVA N [[id number]]

## 2016-11-20 ENCOUNTER — Telehealth: Payer: Self-pay | Admitting: Cardiology

## 2016-11-20 NOTE — Telephone Encounter (Signed)
I can see him if he would prefer.  Would have him see me in 2-3 months if he is stable.

## 2016-11-20 NOTE — Telephone Encounter (Signed)
New message      Talk to the nurse to see if pt will continue seeing Dr Aundra Dubin at the hosp or see a new doctor here at [address].  He is wanting to schedule an appt and want to continue seeing Dr Aundra Dubin

## 2016-11-20 NOTE — Telephone Encounter (Signed)
Please work him in with me next week given marked weight gain.

## 2016-11-20 NOTE — Telephone Encounter (Signed)
Pt [facility] to [facility] Dr [facility] and is aware Dr Aundra Dubin will see him there.   Pt states he has had an increase in shortness of breath, particularly with exertion. Pt states he has had a 12 pound weight gain since October 2017, no sudden weight gain.  I have scheduled pt to see Richardson Dopp 11/25/16 at [facility]. Pt prefers to see Dr Aundra Dubin in [facility] next week, but if that doesn't work out he is fine with seeing Event organiser at [employer].  Pt advised I will forward to Dr Aundra Dubin for review.

## 2016-11-20 NOTE — Telephone Encounter (Signed)
I will forward to Dr McLean for review 

## 2016-11-20 NOTE — Telephone Encounter (Signed)
L/m with pharmacy 1% gel approved

## 2016-11-21 NOTE — Telephone Encounter (Signed)
Called and spoke with patient.  He is scheduled 11/25/16 @2 :20, pt aware

## 2016-11-24 ENCOUNTER — Encounter: Payer: Self-pay | Admitting: Physician Assistant

## 2016-11-24 DIAGNOSIS — K5730 Diverticulosis of large intestine without perforation or abscess without bleeding: Secondary | ICD-10-CM

## 2016-11-24 DIAGNOSIS — K5790 Diverticulosis of intestine, part unspecified, without perforation or abscess without bleeding: Secondary | ICD-10-CM | POA: Insufficient documentation

## 2016-11-25 ENCOUNTER — Ambulatory Visit: Payer: BLUE CROSS/BLUE SHIELD | Admitting: Physician Assistant

## 2016-11-25 ENCOUNTER — Encounter (HOSPITAL_COMMUNITY): Payer: Self-pay

## 2016-11-25 ENCOUNTER — Ambulatory Visit (HOSPITAL_COMMUNITY)
Admission: RE | Admit: 2016-11-25 | Discharge: 2016-11-25 | Disposition: A | Discharge status: Home / Self Care | Payer: BLUE CROSS/BLUE SHIELD | Source: Ambulatory Visit | Attending: Cardiology | Admitting: Cardiology

## 2016-11-25 VITALS — BP 158/85 | HR 73 | Wt 344.5 lb

## 2016-11-25 DIAGNOSIS — I110 Hypertensive heart disease with heart failure: Secondary | ICD-10-CM | POA: Diagnosis not present

## 2016-11-25 DIAGNOSIS — Z7982 Long term (current) use of aspirin: Secondary | ICD-10-CM | POA: Insufficient documentation

## 2016-11-25 DIAGNOSIS — E785 Hyperlipidemia, unspecified: Secondary | ICD-10-CM | POA: Diagnosis not present

## 2016-11-25 DIAGNOSIS — I4891 Unspecified atrial fibrillation: Secondary | ICD-10-CM | POA: Insufficient documentation

## 2016-11-25 DIAGNOSIS — G4733 Obstructive sleep apnea (adult) (pediatric): Secondary | ICD-10-CM | POA: Diagnosis not present

## 2016-11-25 DIAGNOSIS — I351 Nonrheumatic aortic (valve) insufficiency: Secondary | ICD-10-CM | POA: Diagnosis not present

## 2016-11-25 DIAGNOSIS — Z87891 Personal history of nicotine dependence: Secondary | ICD-10-CM | POA: Insufficient documentation

## 2016-11-25 DIAGNOSIS — Z951 Presence of aortocoronary bypass graft: Secondary | ICD-10-CM | POA: Diagnosis not present

## 2016-11-25 DIAGNOSIS — Q2543 Congenital aneurysm of aorta: Secondary | ICD-10-CM | POA: Diagnosis present

## 2016-11-25 DIAGNOSIS — E119 Type 2 diabetes mellitus without complications: Secondary | ICD-10-CM | POA: Insufficient documentation

## 2016-11-25 DIAGNOSIS — I5032 Chronic diastolic (congestive) heart failure: Secondary | ICD-10-CM

## 2016-11-25 DIAGNOSIS — I712 Thoracic aortic aneurysm, without rupture: Secondary | ICD-10-CM | POA: Diagnosis not present

## 2016-11-25 DIAGNOSIS — Z7984 Long term (current) use of oral hypoglycemic drugs: Secondary | ICD-10-CM | POA: Insufficient documentation

## 2016-11-25 DIAGNOSIS — I2510 Atherosclerotic heart disease of native coronary artery without angina pectoris: Secondary | ICD-10-CM | POA: Insufficient documentation

## 2016-11-25 DIAGNOSIS — R42 Dizziness and giddiness: Secondary | ICD-10-CM | POA: Diagnosis not present

## 2016-11-25 LAB — LIPID PANEL
Cholesterol: 172 mg/dL (ref 0–200)
HDL: 36 mg/dL — ABNORMAL LOW (ref >40)
LDL Cholesterol: 110 mg/dL — ABNORMAL HIGH (ref 0–99)
Total CHOL/HDL Ratio: 4.8 RATIO
Triglycerides: 129 mg/dL (ref <150)
VLDL: 26 mg/dL (ref 0–40)

## 2016-11-25 LAB — BRAIN NATRIURETIC PEPTIDE: B Natriuretic Peptide: 38.3 pg/mL (ref 0.0–100.0)

## 2016-11-25 NOTE — Patient Instructions (Signed)
Routine lab work today. Will notify you of abnormal results  Your provider requests you have echo within the next 3 months.  Follow up with Dr.McLean in 6 months

## 2016-11-26 NOTE — Progress Notes (Signed)
Patient ID: Jonathan Miles, male   DOB: [birth year], 56 y.o.   MRN: [id number] PCP: Dr. Ruben Reason Cardiology: Dr. Aundra Dubin  56 yo with history of aortic root aneurysm and CAD returns for followup.  He underwent aortic valve sparing aortic root replacement with CABG x1 (SVG-RCA) on 05/07/12.  I noted a new diastolic murmur on exam and echo in 10/13 showed EF 55-60% with mild to moderate eccentric aortic insufficiency.  Last echo in 2/17 showed normal EF with mild to moderate eccentric aortic insufficiency, ascending aorta 4.4 cm.     He is working full time.  His weight has gone up about 6 lbs since last appointment.  Mild exertional dyspnea with heavy activity, no real change.  He fatigues easier than in the past.  No dyspnea walking on flat ground or climbing a flight of steps.  Rare atypical chest pain. He has OSA but has been unable to tolerate CPAP.  Only real exercise outside work is bowling.  Occasionally dizzy when standing too fast.   ECG: NSR, poor RWP.   Labs (3/12): K 4, creatinine 1.03, LDL 113, HDL 33  Labs (4/12): K 3.7, creatinine 0.8  Labs (1/13): K 3.6, creatinine 0.9, LDL 133, HDL 42  Labs (7/13):  K 3.9, creatinine 0.91, Hgb 8.9 Labs (12/13): LDL 98, HDL 49 Labs (1/14): K 3.7, creatinine 0.9, BNP 268 Labs (8/16): LDL 87, HDL 40 Labs (12/16): K 4.4, creatinine 0.92, BNP 26 Labs (4/17): LDL 92 Labs (1/18): K 4, creatinine 0.85, HCT 40.3   Wt Readings from Last 3 Encounters:  11/25/16 (!) 344 lb 8 oz (156.3 kg)  10/31/16 (!) 348 lb (157.9 kg)  10/16/16 (!) 340 lb (154.2 kg)     Past Medical History: 1. DM  2. Hyperlipidemia  3. HTN  4. Aortic root/ascending aortic aneurysm: 4/12 Echo with 5.4 cm aortic root; 4/12 MRA chest with 5.5 cm aortic root, 3.8 cm ascending thoracic aorta; 10/12 CTA chest with 6.1 x 5.1 cm aortic root/proximal ascending aorta, 4.2 x 4.1 cm mid ascending aorta; 1/13 Echo with 5.4 cm root, 5.5 cm proximal ascending thoracic aorta. S/p valve-sparing  aortic root and ascending aorta replacement with CABG x 1 (SVG-RCA) 04/2012 with Dr. Servando Snare.  CTA chest (12/16) with stable post-op aortic root and ascending aorta.  5. Aortic insufficiency: Echo (1/13): EF 55%, mild LVH, 5.4 cm aortic root, 5.5 cm proximal ascending thoracic aorta. Trileaflet aortic valve with trivial AI.  Echo (10/13) EF 55-60%, s/p valve-sparing aortic root replacement, eccentric mild to moderate AI, normal RV.  Echo (2/16) with EF 55-60%, moderate LVH, mild-moderate eccentric AI, moderate LAE, aortic root 3.8 cm, ascending aorta 4.3 cm.  - Echo (2/17): EF 60-65%, mild to moderate AI, ascending aorta 4.4 cm.  6. Possible contrast allergy  7. CAD: Myoview (4/12): EF 67%, no ischemia or infarction.  LHC (1/13) with long 50% D2 stenosis, 60% proximal RCA, and 50% mid RCA. S/p CABG x 1 with SVG0-RCA at time of ascending aortic aneurysm repair. 8. ? Brief peri-operative atrial fibrillation in 7/13. Nausea with amiodarone.  9. Diastolic CHF 10. OSA: Unable to tolerate CPAP.    Current Outpatient Prescriptions  Medication Sig Dispense Refill  . acetaminophen (TYLENOL) 500 MG tablet TAKE 1-2 TABS EVERY 6 HOURS AS NEEDED FOR PAIN    . aspirin 81 MG tablet Take 81 mg by mouth daily.    Marland Kitchen atorvastatin (LIPITOR) 40 MG tablet Take 1 tablet (40 mg total) by mouth daily. 90 tablet 3  .  carvedilol (COREG) 12.5 MG tablet Take 1 tablet (12.5 mg total) by mouth 2 (two) times daily. 180 tablet 3  . diclofenac sodium (VOLTAREN) 1 % GEL Apply 2 g topically 4 (four) times daily. 300 g 1  . diphenhydrAMINE (BENADRYL) 25 mg capsule Take 2 capsules (50 mg total) by mouth every 6 (six) hours as needed. Take 2 capsules at 8 am on Monday morning 10/15/15 2 capsule 0  . felodipine (PLENDIL) 10 MG 24 hr tablet Take 1 tablet (10 mg total) by mouth daily. 90 tablet 3  . furosemide (LASIX) 40 MG tablet Take 1 tablet (40 mg total) by mouth daily. 90 tablet 3  . lisinopril (PRINIVIL,ZESTRIL) 20 MG tablet Take 1  tablet (20 mg total) by mouth daily. 90 tablet 3  . loratadine (CLARITIN) 10 MG tablet Take 10 mg by mouth daily.    . metFORMIN (GLUCOPHAGE) 1000 MG tablet 1 bid (Patient taking differently: 1,000 mg 2 (two) times daily with a meal. 1 bid) 180 tablet 3  . tamsulosin (FLOMAX) 0.4 MG CAPS capsule Take 2 capsules (0.8 mg total) by mouth daily after supper. 180 capsule 3  . traMADol (ULTRAM) 50 MG tablet Take 2 tablets (100 mg total) by mouth every 6 (six) hours as needed. 90 tablet 0   No current facility-administered medications for this encounter.     Allergies: Allergies  Allergen Reactions  . Iodinated Diagnostic Agents Hives    Hives started 4 days after cta chest was performed, minimal relief w/ benadryl x 3 days, uncertain if reaction was actually iv contrast or other allergen,allergy tests to follow.wife will make [place] aware of results//a.calhoun    Social History  Substance Use Topics  . Smoking status: Former Smoker    Packs/day: 1.00    Years: 13.00    Types: Cigarettes    Quit date: 01/23/1988  . Smokeless tobacco: Never Used  . Alcohol use No     Comment: quit 1989   Family History  Problem Relation Age of Onset  . Adopted: Yes  . Family history unknown: Yes   ROS: All systems reviewed and negative except as per HPI.   PHYSICAL EXAM: VS:  BP (!) 158/85   Pulse 73   Wt (!) 344 lb 8 oz (156.3 kg)   SpO2 98%   BMI 43.06 kg/m  General: Obese, NAD HEENT: normal  Neck: JVP not elevated Cardiac:  normal S1, S2; RRR; 1/6 diastolic murmur LUSB  Chest: Median sternotomy scar well-healed Lungs:  clear to auscultation bilaterally, no wheezing, rhonchi or rales  Abd: soft, nontender, no hepatomegaly  Ext: No clubbing/cyanosis.  Trace ankle edema Skin: warm and dry   Assessment/Plan:  CAD  Moderate RCA stenosis on cath, now s/p SVG-RCA with aortic root/ascending aortic aneurysm replacement. No chest pain.  - Continue ASA, statin.  - Check lipids today, goal LDL < 70.   Aortic root dilatation  Status post aortic root/ascending aorta valve-sparing replacement. CTA chest in 12/16 showed stable post-op aortic root and ascending aorta.  4.4 cm ascending aorta on 2/17 echo.  Diastolic CHF No significant dyspnea on current Lasix dose.  He looks euvolemic. Check BNP.   Hypertension BP high today but will not change meds.  He gets lightheaded with standing at times. Aortic insufficiency Mild to moderate eccentric AI on echo in 2/17. This will need to be followed over time. Need also good BP control.   - Repeat echo.    He needs exercise and diet, think fatigue  and dyspnea would improve with weight loss.   Loralie Champagne 11/26/2016

## 2016-12-02 ENCOUNTER — Encounter (HOSPITAL_COMMUNITY): Payer: Self-pay | Admitting: *Deleted

## 2016-12-03 ENCOUNTER — Other Ambulatory Visit (INDEPENDENT_AMBULATORY_CARE_PROVIDER_SITE_OTHER): Payer: Self-pay | Admitting: Family Medicine

## 2016-12-03 ENCOUNTER — Ambulatory Visit (INDEPENDENT_AMBULATORY_CARE_PROVIDER_SITE_OTHER): Payer: BLUE CROSS/BLUE SHIELD | Admitting: Family Medicine

## 2016-12-03 ENCOUNTER — Encounter (INDEPENDENT_AMBULATORY_CARE_PROVIDER_SITE_OTHER): Payer: Self-pay | Admitting: Family Medicine

## 2016-12-03 VITALS — BP 164/82 | HR 71 | Temp 98.7°F | Resp 20 | Ht 75.0 in | Wt 339.0 lb

## 2016-12-03 DIAGNOSIS — I509 Heart failure, unspecified: Secondary | ICD-10-CM

## 2016-12-03 DIAGNOSIS — R0602 Shortness of breath: Secondary | ICD-10-CM

## 2016-12-03 DIAGNOSIS — Z9189 Other specified personal risk factors, not elsewhere classified: Secondary | ICD-10-CM

## 2016-12-03 DIAGNOSIS — E1165 Type 2 diabetes mellitus with hyperglycemia: Secondary | ICD-10-CM

## 2016-12-03 DIAGNOSIS — R5383 Other fatigue: Secondary | ICD-10-CM | POA: Diagnosis not present

## 2016-12-03 DIAGNOSIS — G4730 Sleep apnea, unspecified: Secondary | ICD-10-CM | POA: Diagnosis not present

## 2016-12-03 DIAGNOSIS — E559 Vitamin D deficiency, unspecified: Secondary | ICD-10-CM | POA: Diagnosis not present

## 2016-12-03 DIAGNOSIS — Z1389 Encounter for screening for other disorder: Secondary | ICD-10-CM

## 2016-12-03 DIAGNOSIS — Z0289 Encounter for other administrative examinations: Secondary | ICD-10-CM

## 2016-12-03 DIAGNOSIS — E118 Type 2 diabetes mellitus with unspecified complications: Secondary | ICD-10-CM | POA: Diagnosis not present

## 2016-12-03 DIAGNOSIS — Z1331 Encounter for screening for depression: Secondary | ICD-10-CM

## 2016-12-03 MED ORDER — BREEZE 2 BLOOD GLUCOSE SYSTEM DEVI: 1.0000 | Freq: Two times a day (BID) | 0 refills | Status: DC

## 2016-12-03 MED FILL — BAYER BREEZE 2 TEST DISK: 25 days supply | Qty: 50 | Fill #0

## 2016-12-03 NOTE — Progress Notes (Signed)
Office: [phone]  /  Fax: [phone]   HPI:   Chief Complaint: OBESITY  Jonathan Miles (MR# [id number]) is a 56 y.o. male who presents on 12/03/2016 for obesity evaluation and treatment. Current BMI is Body mass index is 42.37 kg/m.Jonathan Miles has struggled with obesity for years and has been unsuccessful in either losing weight or maintaining long term weight loss. Jonathan Miles attended our information session and states he is currently in the action stage of change and ready to dedicate time achieving and maintaining a healthier weight.  Jonathan Miles states his family eats meals together he thinks his family will eat healthier with  him he has been heavy most of  his life he started gaining weight about 2015 his heaviest weight ever was 340 lbs. he has significant food cravings issues  he snacks frequently in the evenings he is frequently drinking liquids with calories he frequently makes poor food choices he has problems with excessive hunger  he frequently eats larger portions than normal  he has binge eating behaviors he struggles with emotional eating    Fatigue Jonathan Miles feels his energy is lower than it should be. This has worsened with weight gain and has not worsened recently. Jonathan Miles admits to daytime somnolence and  admits to waking up still tired. Patient is at risk for obstructive sleep apnea. Patent has a history of symptoms of daytime fatigue and Epworth sleepiness scale. Patient generally gets 3 or 4 hours of sleep per night, and states they generally have generally restless sleep. Snoring is present. Apneic episodes are present. Epworth Sleepiness Score is 17.  Dyspnea on exertion Jonathan Miles notes increasing shortness of breath with exercising and seems to be worsening over time with weight gain. He notes getting out of breath sooner with activity than he used to. This has not gotten worse recently. Edley denies orthopnea.  Sleep Apnea Jonathan Miles is unable to use CPAP and reports he hasn't used  CPAP in the past 6 years and there is no recent sleep study.  Diabetes II Jonathan Miles has a diagnosis of diabetes type II. Jonathan Miles is not not checking blood sugars and denies any hypoglycemic episodes. Last A1c was at 7.8  He has been working on intensive lifestyle modifications including diet, exercise, and weight loss to help control his blood glucose levels. He is currently on Metformin 1,000 mg 2 times a day and reports he was prescribed Farxiga but hasn't started this medication yet.  Heart Failure with Preserved Ejection Fraction Jonathan Miles is scheduled for an echocardiogram next week, he denies shortness of breath at rest or orthopnea. He has 1+ pitting edema in bilateral lower extremities, lungs are clear to auscultation. Jonathan Miles is currently on Lasix.  Depression Screen Jonathan Miles's Food and Mood (modified PHQ-9) score was  Depression screen PHQ 2/9 12/03/2016  Decreased Interest 1  Down, Depressed, Hopeless 1  PHQ - 2 Score 2  Altered sleeping 2  Tired, decreased energy 2  Change in appetite 1  Feeling bad or failure about yourself  1  Trouble concentrating 2  Moving slowly or fidgety/restless 1  Suicidal thoughts 0  PHQ-9 Score 11    ALLERGIES: Allergies  Allergen Reactions  . Iodinated Diagnostic Agents Hives    Hives started 4 days after cta chest was performed, minimal relief w/ benadryl x 3 days, uncertain if reaction was actually iv contrast or other allergen,allergy tests to follow.wife will make [place] aware of results//a.[email or web address]    MEDICATIONS: Current Outpatient Prescriptions on File Prior to Visit  Medication Sig  Dispense Refill  . acetaminophen (TYLENOL) 500 MG tablet TAKE 1-2 TABS EVERY 6 HOURS AS NEEDED FOR PAIN    . aspirin 81 MG tablet Take 81 mg by mouth daily.    Marland Kitchen atorvastatin (LIPITOR) 40 MG tablet Take 1 tablet (40 mg total) by mouth daily. 90 tablet 3  . carvedilol (COREG) 12.5 MG tablet Take 1 tablet (12.5 mg total) by mouth 2 (two) times daily. 180 tablet 3  . diclofenac  sodium (VOLTAREN) 1 % GEL Apply 2 g topically 4 (four) times daily. 300 g 1  . diphenhydrAMINE (BENADRYL) 25 mg capsule Take 2 capsules (50 mg total) by mouth every 6 (six) hours as needed. Take 2 capsules at 8 am on Monday morning 10/15/15 2 capsule 0  . felodipine (PLENDIL) 10 MG 24 hr tablet Take 1 tablet (10 mg total) by mouth daily. 90 tablet 3  . furosemide (LASIX) 40 MG tablet Take 1 tablet (40 mg total) by mouth daily. 90 tablet 3  . lisinopril (PRINIVIL,ZESTRIL) 20 MG tablet Take 1 tablet (20 mg total) by mouth daily. 90 tablet 3  . loratadine (CLARITIN) 10 MG tablet Take 10 mg by mouth daily.    . metFORMIN (GLUCOPHAGE) 1000 MG tablet 1 bid (Patient taking differently: 1,000 mg 2 (two) times daily with a meal. 1 bid) 180 tablet 3  . tamsulosin (FLOMAX) 0.4 MG CAPS capsule Take 2 capsules (0.8 mg total) by mouth daily after supper. 180 capsule 3  . traMADol (ULTRAM) 50 MG tablet Take 2 tablets (100 mg total) by mouth every 6 (six) hours as needed. 90 tablet 0   No current facility-administered medications on file prior to visit.     PAST MEDICAL HISTORY: Past Medical History:  Diagnosis Date  . Alcohol abuse   . Aortic root dilatation (HCC)    a. echo 4/12: EF 55-60%, mod to marked dilated ascending aortic root;   b. MRA 4/12: Aortic root 5.5 cm, dilation of left and right coronary cusps, trileaflet aortic valve  . Arthritis   . Asthma    ast attack in early 20's  . CAD (coronary artery disease)   . Chest pain    Myoview 4/12: EF 62%, no ischemia or scar  . CHF (congestive heart failure) ([place])   . Chronic pain    back and hands  . Constipation   . COPD (chronic obstructive pulmonary disease) ([place])   . Depression   . Diabetes mellitus    type 2  . Drug use   . Enlarged prostate   . Fatigue   . GERD (gastroesophageal reflux disease)    modifies with diet  . Hx of echocardiogram    Echo (2/16):  Mod LVH, EF 55-60%, mild to mod AI, mod LAE, mild RAE, Aortic root 38 mm, Asc  aorta 43 mm  . Hyperlipidemia   . Hypertension   . Joint pain   . Knee pain, bilateral   . OSA (obstructive sleep apnea) 2006   .  Wears at times  . Osteoarthritis   . Palpitations   . PONV (postoperative nausea and vomiting)   . SOB (shortness of breath)     PAST SURGICAL HISTORY: Past Surgical History:  Procedure Laterality Date  . Colonscopy    . CORONARY ARTERY BYPASS GRAFT  05/07/2012   Procedure: CORONARY ARTERY BYPASS GRAFTING (CABG);  Surgeon: Grace Isaac, MD;  Location: [facility];  Service: Open Heart Surgery;  Laterality: N/A;  . KNEE ARTHROSCOPY  2010  left  . Retactment of fingers     as a child , sewed with sewing machine- Index and middle finger  . UPPER GASTROINTESTINAL ENDOSCOPY      SOCIAL HISTORY: Social History  Substance Use Topics  . Smoking status: Former Smoker    Packs/day: 1.00    Years: 13.00    Types: Cigarettes    Quit date: 01/23/1988  . Smokeless tobacco: Never Used  . Alcohol use Yes     Comment: quit 1989    FAMILY HISTORY: Family History  Problem Relation Age of Onset  . Adopted: Yes  . Family history unknown: Yes    ROS: Review of Systems  Constitutional: Positive for malaise/fatigue.  HENT:       Decreased Hearing Stuffiness Discharge Hay Fever Dry Mouth  Eyes: Positive for pain (Twitching).       Wear Glasses or Contacts Flashes of Light Floaters  Respiratory: Positive for shortness of breath and wheezing.   Cardiovascular: Positive for orthopnea and claudication.       Shortness of Breath with Activity Sudden Awakening from Sleep with Shortness of Breath Calf/Leg Pain with Walking  Gastrointestinal: Positive for constipation and heartburn.  Genitourinary: Positive for frequency.  Musculoskeletal: Positive for back pain, joint pain and myalgias.       Neck Stiffness Muscle Stiffness Red or Swollen Joints  Skin:       Dryness Hair of Nail Changes  Neurological: Positive for dizziness.  Endo/Heme/Allergies:  Positive for polydipsia. Bruises/bleeds easily.       Polyphagia  Psychiatric/Behavioral: Positive for depression. The patient is nervous/anxious and has insomnia.        Stress    PHYSICAL EXAM: Blood pressure (!) 164/82, pulse 71, temperature 98.7 F (37.1 C), temperature source Oral, resp. rate 20, height 6\' 3"  (1.905 m), weight (!) 339 lb (153.8 kg), SpO2 94 %. Body mass index is 42.37 kg/m. Physical Exam  Constitutional: He is oriented to person, place, and time. He appears well-developed and well-nourished.  Cardiovascular: Normal rate.   Pulmonary/Chest: Effort normal.  Musculoskeletal: He exhibits edema (1+ pitting edema bilateral lower extremities).  Neurological: He is oriented to person, place, and time.  Skin: Skin is warm and dry.  Psychiatric: He has a normal mood and affect. His behavior is normal.  Vitals reviewed.   RECENT LABS AND TESTS: BMET    Component Value Date/Time   NA 141 10/31/2016 1743   K 4.0 10/31/2016 1743   CL 100 10/31/2016 1743   CO2 26 10/31/2016 1743   GLUCOSE 134 (H) 10/31/2016 1743   GLUCOSE 116 (H) 02/16/2016 1004   BUN 16 10/31/2016 1743   CREATININE 0.85 10/31/2016 1743   CREATININE 0.89 02/16/2016 1004   CALCIUM 10.0 10/31/2016 1743   GFRNONAA 97 10/31/2016 1743   GFRNONAA >89 06/09/2013 1416   GFRAA 113 10/31/2016 1743   GFRAA >89 06/09/2013 1416   Lab Results  Component Value Date   HGBA1C 7.8 (H) 10/31/2016   No results found for: INSULIN CBC    Component Value Date/Time   WBC 9.8 10/31/2016 1743   WBC 11.5 (A) 10/06/2015 1727   WBC 11.3 (H) 12/04/2014 1702   RBC 4.91 10/31/2016 1743   RBC 5.38 10/06/2015 1727   RBC 5.08 12/04/2014 1702   HGB 15.3 10/06/2015 1727   HGB 14.4 12/04/2014 1702   HCT 40.3 10/31/2016 1743   PLT 307 10/31/2016 1743   MCV 82 10/31/2016 1743   MCH 28.5 10/31/2016 1743  MCH 28.5 10/06/2015 1727   MCH 29.3 05/14/2012 0605   MCHC 34.7 10/31/2016 1743   MCHC 34.1 10/06/2015 1727   MCHC  33.7 12/04/2014 1702   RDW 14.7 10/31/2016 1743   LYMPHSABS 1.6 10/31/2016 1743   MONOABS 0.8 12/04/2014 1702   EOSABS 0.3 10/31/2016 1743   BASOSABS 0.1 10/31/2016 1743   Iron/TIBC/Ferritin/ %Sat No results found for: IRON, TIBC, FERRITIN, IRONPCTSAT Lipid Panel     Component Value Date/Time   CHOL 172 11/25/2016 1531   TRIG 129 11/25/2016 1531   HDL 36 (L) 11/25/2016 1531   CHOLHDL 4.8 11/25/2016 1531   VLDL 26 11/25/2016 1531   LDLCALC 110 (H) 11/25/2016 1531   Hepatic Function Panel     Component Value Date/Time   PROT 7.0 10/31/2016 1743   ALBUMIN 4.1 10/31/2016 1743   AST 13 10/31/2016 1743   ALT 20 10/31/2016 1743   ALKPHOS 77 10/31/2016 1743   BILITOT 0.3 10/31/2016 1743   BILIDIR 0.1 06/18/2015 0804      Component Value Date/Time   TSH 1.390 10/31/2016 1743    ECG  shows NSR with a rate of 79 BPM INDIRECT CALORIMETER done today shows a VO2 of 314 and a REE of 2186.    ASSESSMENT AND PLAN: Controlled type 2 diabetes mellitus with complication, without long-term current use of insulin ([place]) - Plan: Insulin, random, Microalbumin / creatinine urine ratio  Vitamin D deficiency - Plan: VITAMIN D 25 Hydroxy (Vit-D Deficiency, Fractures)  Other fatigue - Plan: Vitamin B12, Folate  PLAN:  Fatigue Jonathan Miles was informed that his fatigue may be related to obesity, depression or many other causes. Labs will be ordered, and in the meanwhile Jonathan Miles has agreed to work on diet, exercise and weight loss to help with fatigue. Proper sleep hygiene was discussed including the need for 7-8 hours of quality sleep each night. A sleep study was ordered based on symptoms and Epworth score.  Dyspnea on exertion Jonathan Miles shortness of breath appears to be obesity related and exercise induced. He has agreed to work on weight loss and gradually increase exercise to treat his exercise induced shortness of breath. If Jonathan Miles follows our instructions and loses weight without improvement of his  shortness of breath, we will plan to refer to pulmonology. We will monitor this condition regularly. Jonathan Miles agrees to this plan.  Depression Screen Jonathan Miles had a positive depression screening. Depression is commonly associated with obesity and often results in emotional eating behaviors. We will monitor this closely and work on CBT to help improve the non-hunger eating patterns. Referral to Psychology may be required if no improvement is seen as he continues in our clinic.  Sleep Apnea We will refer to sleep specialists for split night sleep study. Jonathan Miles was educated today on the importance of using CPAP for hypertension, congestive heart failure, weight loss and improved overall health.  Diabetes II Jonathan Miles has been given extensive diabetes education by myself today including ideal fasting and post-prandial blood glucose readings, individual ideal HgA1c goals  and hypoglycemia prevention. We discussed the importance of good blood sugar control to decrease the likelihood of diabetic complications such as nephropathy, neuropathy, limb loss, blindness, coronary artery disease, and death. We discussed the importance of intensive lifestyle modification including diet, exercise and weight loss as the first line treatment for diabetes. Prescription written today for Breeze 2 glucometer and strips #100. Yohandry agrees to continue his diabetes medications and will follow up at the agreed upon time.  Heart Failure  with Preserved Ejection Fraction Jonathan Miles will work on diet, exercise and weight loss to improve cardiac function and decrease risk of further heart damage. He is at risk for worsening heart failure due to his uncontrolled HTN, sleep apnea and diabetes. We discussed diet, exercise and weight loss as important part of his therapy and his history of non-compliance which is contributing to his poor health.  Obesity Jonathan Miles is currently in the action stage of change and his goal is to continue with weight loss  efforts He has agreed to follow the Category 3 plan +100 calories Jonathan Miles has been instructed to work up to a goal of 150 minutes of combined cardio and strengthening exercise per week for weight loss and overall health benefits. We discussed the following Behavioral Modification Stratagies today: increasing lean protein intake, decreasing simple carbohydrates , increasing vegetables and increasing lower sugar fruits  Brandonn has agreed to follow up with our clinic in 2 weeks. He was informed of the importance of frequent follow up visits to maximize his success with intensive lifestyle modifications for his multiple health conditions. He was informed we would discuss his lab results at his next visit unless there is a critical issue that needs to be addressed sooner. Adrik agreed to keep his next visit at the agreed upon time to discuss these results.  I, Doreene Nest, am acting as scribe for Dennard Nip, MD  I have reviewed the above documentation for accuracy and completeness, and I agree with the above. -Dennard Nip, MD

## 2016-12-04 LAB — MICROALBUMIN / CREATININE URINE RATIO
Creatinine, Urine: 145.1 mg/dL
Microalb/Creat Ratio: 6.8 mg/g creat (ref 0.0–30.0)
Microalbumin, Urine: 9.8 ug/mL

## 2016-12-04 LAB — VITAMIN B12: Vitamin B-12: 594 pg/mL (ref 232–1245)

## 2016-12-04 LAB — FOLATE: Folate: 14.9 ng/mL (ref >3.0)

## 2016-12-04 LAB — INSULIN, RANDOM: INSULIN: 29.5 u[IU]/mL — ABNORMAL HIGH (ref 2.6–24.9)

## 2016-12-04 LAB — VITAMIN D 25 HYDROXY (VIT D DEFICIENCY, FRACTURES): Vit D, 25-Hydroxy: 19.2 ng/mL — ABNORMAL LOW (ref 30.0–100.0)

## 2016-12-13 ENCOUNTER — Encounter (INDEPENDENT_AMBULATORY_CARE_PROVIDER_SITE_OTHER): Payer: Self-pay | Admitting: Family Medicine

## 2016-12-16 ENCOUNTER — Encounter (INDEPENDENT_AMBULATORY_CARE_PROVIDER_SITE_OTHER): Payer: Self-pay | Admitting: Family Medicine

## 2016-12-17 ENCOUNTER — Ambulatory Visit (INDEPENDENT_AMBULATORY_CARE_PROVIDER_SITE_OTHER): Payer: BLUE CROSS/BLUE SHIELD | Admitting: Family Medicine

## 2016-12-17 ENCOUNTER — Encounter (INDEPENDENT_AMBULATORY_CARE_PROVIDER_SITE_OTHER): Payer: Self-pay | Admitting: Family Medicine

## 2016-12-17 VITALS — BP 129/74 | HR 64 | Temp 98.5°F | Ht 75.0 in | Wt 325.0 lb

## 2016-12-17 DIAGNOSIS — I25119 Atherosclerotic heart disease of native coronary artery with unspecified angina pectoris: Secondary | ICD-10-CM | POA: Diagnosis not present

## 2016-12-17 DIAGNOSIS — E119 Type 2 diabetes mellitus without complications: Secondary | ICD-10-CM | POA: Diagnosis not present

## 2016-12-17 DIAGNOSIS — E559 Vitamin D deficiency, unspecified: Secondary | ICD-10-CM | POA: Diagnosis not present

## 2016-12-17 MED ORDER — VITAMIN D (ERGOCALCIFEROL) 1.25 MG (50000 UNIT) PO CAPS: 50000.0000 [IU] | ORAL_CAPSULE | ORAL | 0 refills | Status: DC

## 2016-12-17 NOTE — Progress Notes (Signed)
Office: [phone]  /  Fax: [phone]   HPI:   Chief Complaint: OBESITY Jonathan Miles is here to discuss his progress with his obesity treatment plan. He is following his eating plan approximately 95 % of the time and states he is exercising 0 minutes 0 times per week. Patient has done well with weight loss but has lost a little too much in the last two weeks. He didn't like the cottage cheese and felt the dinner was too large for him. His weight is (!) 325 lb (147.4 kg) today and has had a weight loss of 14 pounds over a period of 2 weeks since his last visit. He has lost 14 lbs since starting treatment with [place].  Vitamin D deficiency Jonathan Miles has a new diagnosis of vitamin D deficiency. He is not currently taking vit D and denies nausea, vomiting or muscle weakness. He does have fatigue.  Diabetes II Jonathan Miles has a diagnosis of diabetes type II. Jonathan Miles states BGs range between 89 and 146 and did not report any hypoglycemic episodes. Last A1c was Hemoglobin A1C Latest Ref Rng & Units 10/31/2016 02/16/2016  HGBA1C 4.8 - 5.6 % 7.8(H) 6.4  Some recent data might be hidden    He has been working on intensive lifestyle modifications including diet, exercise, and weight loss to help control his blood glucose levels. He is currently on Metformin 1000 mg BID. Based on the most recent A1C he is not yet at goal.   Wt Readings from Last 500 Encounters:  12/17/16 (!) 325 lb (147.4 kg)  12/03/16 (!) 339 lb (153.8 kg)  11/25/16 (!) 344 lb 8 oz (156.3 kg)  10/31/16 (!) 348 lb (157.9 kg)  10/16/16 (!) 340 lb (154.2 kg)  02/16/16 (!) 333 lb 3.2 oz (151.1 kg)  11/02/15 (!) 338 lb 12.8 oz (153.7 kg)  10/15/15 (!) 324 lb (147 kg)  10/08/15 (!) 324 lb (147 kg)  10/06/15 (!) 322 lb 3.2 oz (146.1 kg)  09/27/15 (!) 331 lb 12.8 oz (150.5 kg)  07/12/15 (!) 337 lb 1.9 oz (152.9 kg)  07/06/15 (!) 337 lb (152.9 kg)  12/04/14 (!) 340 lb (154.2 kg)  10/04/14 (!) 332 lb (150.6 kg)  12/10/13 (!) 339 lb 2 oz (153.8 kg)    07/05/13 (!) 334 lb (151.5 kg)  06/17/13 (!) 320 lb (145.2 kg)  02/09/13 (!) 325 lb (147.4 kg)  12/15/12 (!) 326 lb (147.9 kg)  11/23/12 (!) 329 lb (149.2 kg)  10/07/12 (!) 313 lb (142 kg)  07/27/12 298 lb 8 oz (135.4 kg)  07/01/12 287 lb (130.2 kg)  06/21/12 273 lb 6.4 oz (124 kg)  06/03/12 285 lb (129.3 kg)  06/03/12 286 lb 13.1 oz (130.1 kg)  05/26/12 289 lb (131.1 kg)  05/15/12 (!) 310 lb 10.1 oz (140.9 kg)  05/05/12 (!) 305 lb 6.4 oz (138.5 kg)  03/04/12 (!) 325 lb (147.4 kg)  02/23/12 (!) 320 lb (145.2 kg)  11/28/11 (!) 313 lb 6.4 oz (142.2 kg)  11/25/11 (!) 311 lb (141.1 kg)  11/14/11 (!) 314 lb (142.4 kg)  11/11/11 (!) 314 lb (142.4 kg)  09/25/11 298 lb (135.2 kg)  02/18/11 (!) 304 lb 12.8 oz (138.3 kg)  02/12/11 (!) 300 lb (136.1 kg)  01/23/11 (!) 301 lb 4 oz (136.6 kg)     ALLERGIES: Allergies  Allergen Reactions  . Iodinated Diagnostic Agents Hives    Hives started 4 days after cta chest was performed, minimal relief w/ benadryl x 3 days, uncertain if  reaction was actually iv contrast or other allergen,allergy tests to follow.wife will make [place] aware of results//a.calhoun    MEDICATIONS: Current Outpatient Prescriptions on File Prior to Visit  Medication Sig Dispense Refill  . acetaminophen (TYLENOL) 500 MG tablet TAKE 1-2 TABS EVERY 6 HOURS AS NEEDED FOR PAIN    . aspirin 81 MG tablet Take 81 mg by mouth daily.    Marland Kitchen atorvastatin (LIPITOR) 40 MG tablet Take 1 tablet (40 mg total) by mouth daily. 90 tablet 3  . Blood Glucose Monitoring Suppl (BREEZE 2 BLOOD GLUCOSE SYSTEM) DEVI 1 Package by Does not apply route 2 (two) times daily. 1 Device 0  . carvedilol (COREG) 12.5 MG tablet Take 1 tablet (12.5 mg total) by mouth 2 (two) times daily. 180 tablet 3  . diclofenac sodium (VOLTAREN) 1 % GEL Apply 2 g topically 4 (four) times daily. 300 g 1  . diphenhydrAMINE (BENADRYL) 25 mg capsule Take 2 capsules (50 mg total) by mouth every 6 (six) hours as needed. Take 2  capsules at 8 am on Monday morning 10/15/15 2 capsule 0  . felodipine (PLENDIL) 10 MG 24 hr tablet Take 1 tablet (10 mg total) by mouth daily. 90 tablet 3  . furosemide (LASIX) 40 MG tablet Take 1 tablet (40 mg total) by mouth daily. 90 tablet 3  . lisinopril (PRINIVIL,ZESTRIL) 20 MG tablet Take 1 tablet (20 mg total) by mouth daily. 90 tablet 3  . loratadine (CLARITIN) 10 MG tablet Take 10 mg by mouth daily.    . metFORMIN (GLUCOPHAGE) 1000 MG tablet 1 bid (Patient taking differently: 1,000 mg 2 (two) times daily with a meal. 1 bid) 180 tablet 3  . tamsulosin (FLOMAX) 0.4 MG CAPS capsule Take 2 capsules (0.8 mg total) by mouth daily after supper. 180 capsule 3  . traMADol (ULTRAM) 50 MG tablet Take 2 tablets (100 mg total) by mouth every 6 (six) hours as needed. 90 tablet 0   No current facility-administered medications on file prior to visit.     PAST MEDICAL HISTORY: Past Medical History:  Diagnosis Date  . Alcohol abuse   . Aortic root dilatation (HCC)    a. echo 4/12: EF 55-60%, mod to marked dilated ascending aortic root;   b. MRA 4/12: Aortic root 5.5 cm, dilation of left and right coronary cusps, trileaflet aortic valve  . Arthritis   . Asthma    ast attack in early 20's  . CAD (coronary artery disease)   . Chest pain    Myoview 4/12: EF 62%, no ischemia or scar  . CHF (congestive heart failure) ([place])   . Chronic pain    back and hands  . Constipation   . COPD (chronic obstructive pulmonary disease) ([place])   . Depression   . Diabetes mellitus    type 2  . Drug use   . Enlarged prostate   . Fatigue   . GERD (gastroesophageal reflux disease)    modifies with diet  . Hx of echocardiogram    Echo (2/16):  Mod LVH, EF 55-60%, mild to mod AI, mod LAE, mild RAE, Aortic root 38 mm, Asc aorta 43 mm  . Hyperlipidemia   . Hypertension   . Joint pain   . Knee pain, bilateral   . OSA (obstructive sleep apnea) 2006   .  Wears at times  . Osteoarthritis   . Palpitations   .  PONV (postoperative nausea and vomiting)   . SOB (shortness of breath)  PAST SURGICAL HISTORY: Past Surgical History:  Procedure Laterality Date  . Colonscopy    . CORONARY ARTERY BYPASS GRAFT  05/07/2012   Procedure: CORONARY ARTERY BYPASS GRAFTING (CABG);  Surgeon: Grace Isaac, MD;  Location: [facility];  Service: Open Heart Surgery;  Laterality: N/A;  . KNEE ARTHROSCOPY  2010   left  . Retactment of fingers     as a child , sewed with sewing machine- Index and middle finger  . UPPER GASTROINTESTINAL ENDOSCOPY      SOCIAL HISTORY: Social History  Substance Use Topics  . Smoking status: Former Smoker    Packs/day: 1.00    Years: 13.00    Types: Cigarettes    Quit date: 01/23/1988  . Smokeless tobacco: Never Used  . Alcohol use Yes     Comment: quit 1989    FAMILY HISTORY: Family History  Problem Relation Age of Onset  . Adopted: Yes  . Family history unknown: Yes    ROS: Review of Systems  Constitutional: Positive for malaise/fatigue and weight loss.  All other systems reviewed and are negative.   PHYSICAL EXAM: Blood pressure 129/74, pulse 64, temperature 98.5 F (36.9 C), temperature source Oral, height 6\' 3"  (1.905 m), weight (!) 325 lb (147.4 kg), SpO2 97 %. Body mass index is 40.62 kg/m. Physical Exam  Constitutional: He is oriented to person, place, and time. He appears well-developed and well-nourished.  HENT:  Head: Normocephalic and atraumatic.  Eyes: EOM are normal.  Neck: Normal range of motion.  Cardiovascular: Normal rate and regular rhythm.   Pulmonary/Chest: Effort normal.  Abdominal: Soft.  Musculoskeletal: Normal range of motion.  Neurological: He is alert and oriented to person, place, and time.  Skin: Skin is warm and dry.  Psychiatric: He has a normal mood and affect. His behavior is normal.    RECENT LABS AND TESTS: BMET    Component Value Date/Time   NA 141 10/31/2016 1743   K 4.0 10/31/2016 1743   CL 100 10/31/2016 1743    CO2 26 10/31/2016 1743   GLUCOSE 134 (H) 10/31/2016 1743   GLUCOSE 116 (H) 02/16/2016 1004   BUN 16 10/31/2016 1743   CREATININE 0.85 10/31/2016 1743   CREATININE 0.89 02/16/2016 1004   CALCIUM 10.0 10/31/2016 1743   GFRNONAA 97 10/31/2016 1743   GFRNONAA >89 06/09/2013 1416   GFRAA 113 10/31/2016 1743   GFRAA >89 06/09/2013 1416   Lab Results  Component Value Date   HGBA1C 7.8 (H) 10/31/2016   HGBA1C 6.4 02/16/2016   HGBA1C 6.4 07/06/2015   HGBA1C 6.9 10/04/2014   HGBA1C 10.0 12/10/2013   Lab Results  Component Value Date   INSULIN 29.5 (H) 12/03/2016   CBC    Component Value Date/Time   WBC 9.8 10/31/2016 1743   WBC 11.5 (A) 10/06/2015 1727   WBC 11.3 (H) 12/04/2014 1702   RBC 4.91 10/31/2016 1743   RBC 5.38 10/06/2015 1727   RBC 5.08 12/04/2014 1702   HGB 15.3 10/06/2015 1727   HGB 14.4 12/04/2014 1702   HCT 40.3 10/31/2016 1743   PLT 307 10/31/2016 1743   MCV 82 10/31/2016 1743   MCH 28.5 10/31/2016 1743   MCH 28.5 10/06/2015 1727   MCH 29.3 05/14/2012 0605   MCHC 34.7 10/31/2016 1743   MCHC 34.1 10/06/2015 1727   MCHC 33.7 12/04/2014 1702   RDW 14.7 10/31/2016 1743   LYMPHSABS 1.6 10/31/2016 1743   MONOABS 0.8 12/04/2014 1702   EOSABS 0.3 10/31/2016 1743  BASOSABS 0.1 10/31/2016 1743   Iron/TIBC/Ferritin/ %Sat No results found for: IRON, TIBC, FERRITIN, IRONPCTSAT Lipid Panel     Component Value Date/Time   CHOL 172 11/25/2016 1531   TRIG 129 11/25/2016 1531   HDL 36 (L) 11/25/2016 1531   CHOLHDL 4.8 11/25/2016 1531   VLDL 26 11/25/2016 1531   LDLCALC 110 (H) 11/25/2016 1531   Hepatic Function Panel     Component Value Date/Time   PROT 7.0 10/31/2016 1743   ALBUMIN 4.1 10/31/2016 1743   AST 13 10/31/2016 1743   ALT 20 10/31/2016 1743   ALKPHOS 77 10/31/2016 1743   BILITOT 0.3 10/31/2016 1743   BILIDIR 0.1 06/18/2015 0804      Component Value Date/Time   TSH 1.390 10/31/2016 1743    ASSESSMENT AND PLAN: Vitamin D deficiency - Plan:  Vitamin D, Ergocalciferol, (DRISDOL) 50000 units CAPS capsule  Type 2 diabetes mellitus without complication, without long-term current use of insulin (HCC)  Morbid obesity ([place])  PLAN: Vitamin D Deficiency Jonathan Miles was informed that low vitamin D levels contributes to fatigue and are associated with obesity, breast, and colon cancer. He agrees to continue to take prescription Vit D @50 ,000 IU every week and will follow up for routine testing of vitamin D, at least 2-3 times per year. He was informed of the risk of over-replacement of vitamin D and agrees to not increase his dose unless he discusses this with [place] first. Prescription sent to the pharmacy for a 30 day supply with 0 refills. Will follow up.    Diabetes II Jonathan Miles has been given extensive diabetes education by myself today including ideal fasting and post-prandial blood glucose readings, individual ideal HgA1c goals  and hypoglycemia prevention. We discussed the importance of good blood sugar control to decrease the likelihood of diabetic complications such as nephropathy, neuropathy, limb loss, blindness, coronary artery disease, and death. We discussed the importance of intensive lifestyle modification including diet, exercise and weight loss as the first line treatment for diabetes. Jonathan Miles agrees to continue his diabetes medications and will follow up at the agreed upon time. Will send a new prescription over for Breeze 2 glucometer (1) to check blood sugars at home.   Jonathan Miles has a hx of heart disease and is at high risk for further cardiac problems due to his HTN, HLD DM and Morbid Obesity. We discussed his risk of another heart attack or stroke and discussed what the evidence show to be most effective for secondary prevention.  Obesity Jonathan Miles is currently in the action stage of change. As such, his goal is to continue with weight loss efforts He has agreed to follow the Category 3 plan + 200 calories Jonathan Miles has been instructed to work up  to a goal of 150 minutes of combined cardio and strengthening exercise per week for weight loss and overall health benefits. We discussed the following Behavioral Modification Stratagies today: increasing lean protein intake and decreasing simple carbohydrates    We spent > than 50% of the 60 minute visit on the counseling as documented in the note.  Jonathan Miles has agreed to follow up with our clinic in 2 weeks. He was informed of the importance of frequent follow up visits to maximize his success with intensive lifestyle modifications for his multiple health conditions.  I, April Moore , am acting as Education administrator for Dennard Nip, MD  I have reviewed the above documentation for accuracy and completeness, and I agree with the above. -Dennard Nip, MD

## 2016-12-18 MED FILL — VIT D2 1.25 MG (50,000 UNIT: 1.25 MG | 28 days supply | Qty: 4 | Fill #0

## 2016-12-24 ENCOUNTER — Ambulatory Visit (HOSPITAL_COMMUNITY)
Admission: RE | Admit: 2016-12-24 | Discharge: 2016-12-24 | Disposition: A | Discharge status: Home / Self Care | Payer: BLUE CROSS/BLUE SHIELD | Source: Ambulatory Visit | Attending: Physician Assistant | Admitting: Physician Assistant

## 2016-12-24 DIAGNOSIS — I351 Nonrheumatic aortic (valve) insufficiency: Secondary | ICD-10-CM | POA: Insufficient documentation

## 2016-12-24 DIAGNOSIS — I5032 Chronic diastolic (congestive) heart failure: Secondary | ICD-10-CM | POA: Diagnosis not present

## 2016-12-24 DIAGNOSIS — I110 Hypertensive heart disease with heart failure: Secondary | ICD-10-CM | POA: Insufficient documentation

## 2016-12-24 DIAGNOSIS — I2510 Atherosclerotic heart disease of native coronary artery without angina pectoris: Secondary | ICD-10-CM | POA: Diagnosis not present

## 2016-12-24 NOTE — Progress Notes (Signed)
  Echocardiogram 2D Echocardiogram has been performed.  Jonathan Miles 12/24/2016, 4:45 PM

## 2016-12-25 HISTORY — PX: APPENDECTOMY: SHX54

## 2016-12-27 ENCOUNTER — Other Ambulatory Visit: Payer: Self-pay | Admitting: Physician Assistant

## 2016-12-27 DIAGNOSIS — M25562 Pain in left knee: Principal | ICD-10-CM

## 2016-12-27 DIAGNOSIS — G8929 Other chronic pain: Secondary | ICD-10-CM

## 2016-12-27 NOTE — Telephone Encounter (Signed)
10/31/16 last ov and refill 

## 2016-12-29 ENCOUNTER — Telehealth (HOSPITAL_COMMUNITY): Payer: Self-pay | Admitting: *Deleted

## 2016-12-29 NOTE — Telephone Encounter (Signed)
Meds ordered this encounter  Medications  . traMADol (ULTRAM) 50 MG tablet    Sig: TAKE TWO TABLETS BY MOUTH EVERY 6 HOURS AS NEEDED    Dispense:  90 tablet    Refill:  0    

## 2016-12-29 NOTE — Telephone Encounter (Signed)
ECHOCARDIOGRAM COMPLETE  Order: [id number]  Status:  Final result Visible to patient:  Yes (MyChart) Dx:  Chronic diastolic CHF (congestive hea...  Notes Recorded by Kennieth Rad, RN on 12/29/2016 at 3:43 PM EST Called and spoke with patient, he is aware and no further questions. ------  Notes Recorded by Larey Dresser, MD on 12/25/2016 at 11:52 AM EST EF normal, only mild AI.

## 2016-12-29 NOTE — Telephone Encounter (Signed)
Called to [facility] 

## 2017-01-05 ENCOUNTER — Ambulatory Visit (INDEPENDENT_AMBULATORY_CARE_PROVIDER_SITE_OTHER): Payer: BLUE CROSS/BLUE SHIELD | Admitting: Family Medicine

## 2017-01-05 ENCOUNTER — Encounter (INDEPENDENT_AMBULATORY_CARE_PROVIDER_SITE_OTHER): Payer: Self-pay

## 2017-01-06 ENCOUNTER — Emergency Department (HOSPITAL_COMMUNITY): Payer: BLUE CROSS/BLUE SHIELD | Admitting: Anesthesiology

## 2017-01-06 ENCOUNTER — Encounter (HOSPITAL_COMMUNITY): Payer: Self-pay | Admitting: Emergency Medicine

## 2017-01-06 ENCOUNTER — Emergency Department (HOSPITAL_COMMUNITY): Payer: BLUE CROSS/BLUE SHIELD

## 2017-01-06 ENCOUNTER — Observation Stay (HOSPITAL_COMMUNITY)
Admission: EM | Admit: 2017-01-06 | Discharge: 2017-01-07 | Disposition: A | Discharge status: Home / Self Care | Payer: BLUE CROSS/BLUE SHIELD | Attending: General Surgery | Admitting: General Surgery

## 2017-01-06 ENCOUNTER — Encounter (HOSPITAL_COMMUNITY)
Admission: EM | Disposition: A | Discharge status: Home / Self Care | Payer: Self-pay | Source: Home / Self Care | Attending: Emergency Medicine

## 2017-01-06 ENCOUNTER — Encounter (INDEPENDENT_AMBULATORY_CARE_PROVIDER_SITE_OTHER): Payer: Self-pay

## 2017-01-06 ENCOUNTER — Ambulatory Visit (INDEPENDENT_AMBULATORY_CARE_PROVIDER_SITE_OTHER): Payer: BLUE CROSS/BLUE SHIELD | Admitting: Family Medicine

## 2017-01-06 ENCOUNTER — Ambulatory Visit (HOSPITAL_COMMUNITY)
Admission: EM | Admit: 2017-01-06 | Discharge: 2017-01-06 | Disposition: A | Discharge status: Home / Self Care | Payer: BLUE CROSS/BLUE SHIELD | Source: Home / Self Care | Attending: Internal Medicine | Admitting: Internal Medicine

## 2017-01-06 DIAGNOSIS — Z6839 Body mass index (BMI) 39.0-39.9, adult: Secondary | ICD-10-CM | POA: Diagnosis not present

## 2017-01-06 DIAGNOSIS — M79641 Pain in right hand: Secondary | ICD-10-CM | POA: Diagnosis not present

## 2017-01-06 DIAGNOSIS — N400 Enlarged prostate without lower urinary tract symptoms: Secondary | ICD-10-CM | POA: Diagnosis not present

## 2017-01-06 DIAGNOSIS — E785 Hyperlipidemia, unspecified: Secondary | ICD-10-CM | POA: Diagnosis not present

## 2017-01-06 DIAGNOSIS — R1031 Right lower quadrant pain: Secondary | ICD-10-CM

## 2017-01-06 DIAGNOSIS — I509 Heart failure, unspecified: Secondary | ICD-10-CM | POA: Diagnosis not present

## 2017-01-06 DIAGNOSIS — K8020 Calculus of gallbladder without cholecystitis without obstruction: Secondary | ICD-10-CM | POA: Insufficient documentation

## 2017-01-06 DIAGNOSIS — Z9889 Other specified postprocedural states: Secondary | ICD-10-CM | POA: Insufficient documentation

## 2017-01-06 DIAGNOSIS — M549 Dorsalgia, unspecified: Secondary | ICD-10-CM | POA: Insufficient documentation

## 2017-01-06 DIAGNOSIS — F329 Major depressive disorder, single episode, unspecified: Secondary | ICD-10-CM | POA: Diagnosis not present

## 2017-01-06 DIAGNOSIS — M25562 Pain in left knee: Secondary | ICD-10-CM | POA: Diagnosis not present

## 2017-01-06 DIAGNOSIS — I110 Hypertensive heart disease with heart failure: Secondary | ICD-10-CM | POA: Insufficient documentation

## 2017-01-06 DIAGNOSIS — Z7984 Long term (current) use of oral hypoglycemic drugs: Secondary | ICD-10-CM | POA: Diagnosis not present

## 2017-01-06 DIAGNOSIS — M79642 Pain in left hand: Secondary | ICD-10-CM | POA: Insufficient documentation

## 2017-01-06 DIAGNOSIS — Z87891 Personal history of nicotine dependence: Secondary | ICD-10-CM | POA: Insufficient documentation

## 2017-01-06 DIAGNOSIS — E119 Type 2 diabetes mellitus without complications: Secondary | ICD-10-CM | POA: Insufficient documentation

## 2017-01-06 DIAGNOSIS — E1151 Type 2 diabetes mellitus with diabetic peripheral angiopathy without gangrene: Secondary | ICD-10-CM | POA: Insufficient documentation

## 2017-01-06 DIAGNOSIS — J449 Chronic obstructive pulmonary disease, unspecified: Secondary | ICD-10-CM | POA: Insufficient documentation

## 2017-01-06 DIAGNOSIS — Z79899 Other long term (current) drug therapy: Secondary | ICD-10-CM | POA: Insufficient documentation

## 2017-01-06 DIAGNOSIS — G4733 Obstructive sleep apnea (adult) (pediatric): Secondary | ICD-10-CM | POA: Diagnosis not present

## 2017-01-06 DIAGNOSIS — I351 Nonrheumatic aortic (valve) insufficiency: Secondary | ICD-10-CM | POA: Diagnosis not present

## 2017-01-06 DIAGNOSIS — Z791 Long term (current) use of non-steroidal anti-inflammatories (NSAID): Secondary | ICD-10-CM | POA: Insufficient documentation

## 2017-01-06 DIAGNOSIS — M1990 Unspecified osteoarthritis, unspecified site: Secondary | ICD-10-CM | POA: Diagnosis not present

## 2017-01-06 DIAGNOSIS — Z951 Presence of aortocoronary bypass graft: Secondary | ICD-10-CM | POA: Insufficient documentation

## 2017-01-06 DIAGNOSIS — I700 Atherosclerosis of aorta: Secondary | ICD-10-CM | POA: Insufficient documentation

## 2017-01-06 DIAGNOSIS — Z7982 Long term (current) use of aspirin: Secondary | ICD-10-CM | POA: Insufficient documentation

## 2017-01-06 DIAGNOSIS — K353 Acute appendicitis with localized peritonitis, without perforation or gangrene: Secondary | ICD-10-CM

## 2017-01-06 DIAGNOSIS — K219 Gastro-esophageal reflux disease without esophagitis: Secondary | ICD-10-CM | POA: Insufficient documentation

## 2017-01-06 DIAGNOSIS — I2510 Atherosclerotic heart disease of native coronary artery without angina pectoris: Secondary | ICD-10-CM | POA: Insufficient documentation

## 2017-01-06 DIAGNOSIS — Z886 Allergy status to analgesic agent status: Secondary | ICD-10-CM | POA: Insufficient documentation

## 2017-01-06 DIAGNOSIS — G8929 Other chronic pain: Secondary | ICD-10-CM | POA: Insufficient documentation

## 2017-01-06 DIAGNOSIS — M25561 Pain in right knee: Secondary | ICD-10-CM | POA: Diagnosis not present

## 2017-01-06 DIAGNOSIS — Z91041 Radiographic dye allergy status: Secondary | ICD-10-CM | POA: Insufficient documentation

## 2017-01-06 DIAGNOSIS — Z9049 Acquired absence of other specified parts of digestive tract: Secondary | ICD-10-CM

## 2017-01-06 HISTORY — PX: LAPAROSCOPIC APPENDECTOMY: SHX408

## 2017-01-06 LAB — COMPREHENSIVE METABOLIC PANEL
ALT: 21 U/L (ref 17–63)
AST: 12 U/L — ABNORMAL LOW (ref 15–41)
Albumin: 4.2 g/dL (ref 3.5–5.0)
Alkaline Phosphatase: 64 U/L (ref 38–126)
Anion gap: 13 (ref 5–15)
BUN: 11 mg/dL (ref 6–20)
CO2: 24 mmol/L (ref 22–32)
Calcium: 9.4 mg/dL (ref 8.9–10.3)
Chloride: 104 mmol/L (ref 101–111)
Creatinine, Ser: 0.92 mg/dL (ref 0.61–1.24)
GFR calc Af Amer: >60 mL/min (ref >60)
GFR calc non Af Amer: >60 mL/min (ref >60)
Glucose, Bld: 104 mg/dL — ABNORMAL HIGH (ref 65–99)
Potassium: 3.6 mmol/L (ref 3.5–5.1)
Sodium: 141 mmol/L (ref 135–145)
Total Bilirubin: 0.7 mg/dL (ref 0.3–1.2)
Total Protein: 7.3 g/dL (ref 6.5–8.1)

## 2017-01-06 LAB — URINALYSIS, ROUTINE W REFLEX MICROSCOPIC
Bilirubin Urine: NEGATIVE
Glucose, UA: NEGATIVE mg/dL
Hgb urine dipstick: NEGATIVE
Ketones, ur: NEGATIVE mg/dL
Leukocytes, UA: NEGATIVE
Nitrite: NEGATIVE
Protein, ur: NEGATIVE mg/dL
Specific Gravity, Urine: 1.013 (ref 1.005–1.030)
pH: 5 (ref 5.0–8.0)

## 2017-01-06 LAB — CBC
HCT: 42.5 % (ref 39.0–52.0)
Hemoglobin: 14.4 g/dL (ref 13.0–17.0)
MCH: 28.7 pg (ref 26.0–34.0)
MCHC: 33.9 g/dL (ref 30.0–36.0)
MCV: 84.8 fL (ref 78.0–100.0)
Platelets: 299 10*3/uL (ref 150–400)
RBC: 5.01 MIL/uL (ref 4.22–5.81)
RDW: 14.2 % (ref 11.5–15.5)
WBC: 12.4 10*3/uL — ABNORMAL HIGH (ref 4.0–10.5)

## 2017-01-06 LAB — LIPASE, BLOOD: Lipase: 16 U/L (ref 11–51)

## 2017-01-06 SURGERY — APPENDECTOMY, LAPAROSCOPIC
Anesthesia: General | Site: Abdomen

## 2017-01-06 MED ORDER — ONDANSETRON HCL 4 MG/2ML IJ SOLN
4.0000 mg | Freq: Once | INTRAMUSCULAR | Status: AC
  Administered 2017-01-06: 4 mg via INTRAVENOUS
  Filled 2017-01-06: qty 2

## 2017-01-06 MED ORDER — SODIUM CHLORIDE 0.9 % IR SOLN
Status: DC | PRN
  Administered 2017-01-06: 1000 mL

## 2017-01-06 MED ORDER — PROPOFOL 10 MG/ML IV BOLUS
INTRAVENOUS | Status: AC
  Filled 2017-01-06: qty 20

## 2017-01-06 MED ORDER — SUCCINYLCHOLINE CHLORIDE 20 MG/ML IJ SOLN
INTRAMUSCULAR | Status: DC | PRN
  Administered 2017-01-06: 160 mg via INTRAVENOUS

## 2017-01-06 MED ORDER — ROCURONIUM BROMIDE 100 MG/10ML IV SOLN
INTRAVENOUS | Status: DC | PRN
  Administered 2017-01-06: 50 mg via INTRAVENOUS

## 2017-01-06 MED ORDER — MIDAZOLAM HCL 2 MG/2ML IJ SOLN
INTRAMUSCULAR | Status: AC
  Filled 2017-01-06: qty 2

## 2017-01-06 MED ORDER — ROCURONIUM BROMIDE 50 MG/5ML IV SOSY
PREFILLED_SYRINGE | INTRAVENOUS | Status: AC
  Filled 2017-01-06: qty 5

## 2017-01-06 MED ORDER — DEXTROSE 5 % IV SOLN
1.0000 g | Freq: Once | INTRAVENOUS | Status: AC
  Administered 2017-01-06: 1 g via INTRAVENOUS
  Filled 2017-01-06: qty 10

## 2017-01-06 MED ORDER — PROMETHAZINE HCL 25 MG/ML IJ SOLN: 6.2500 mg | INTRAMUSCULAR | Status: DC | PRN

## 2017-01-06 MED ORDER — ONDANSETRON HCL 4 MG/2ML IJ SOLN
INTRAMUSCULAR | Status: DC | PRN
  Administered 2017-01-06: 4 mg via INTRAVENOUS

## 2017-01-06 MED ORDER — ONDANSETRON HCL 4 MG/2ML IJ SOLN
INTRAMUSCULAR | Status: AC
  Filled 2017-01-06: qty 4

## 2017-01-06 MED ORDER — MORPHINE SULFATE (PF) 4 MG/ML IV SOLN
4.0000 mg | Freq: Once | INTRAVENOUS | Status: AC
Start: 2017-01-06 — End: 2017-01-06
  Administered 2017-01-06: 4 mg via INTRAVENOUS
  Filled 2017-01-06: qty 1

## 2017-01-06 MED ORDER — SUGAMMADEX SODIUM 500 MG/5ML IV SOLN
INTRAVENOUS | Status: AC
  Filled 2017-01-06: qty 5

## 2017-01-06 MED ORDER — BUPIVACAINE HCL (PF) 0.25 % IJ SOLN
INTRAMUSCULAR | Status: AC
  Filled 2017-01-06: qty 30

## 2017-01-06 MED ORDER — SUCCINYLCHOLINE CHLORIDE 200 MG/10ML IV SOSY
PREFILLED_SYRINGE | INTRAVENOUS | Status: AC
  Filled 2017-01-06: qty 10

## 2017-01-06 MED ORDER — MIDAZOLAM HCL 2 MG/2ML IJ SOLN
INTRAMUSCULAR | Status: DC | PRN
  Administered 2017-01-06: 2 mg via INTRAVENOUS

## 2017-01-06 MED ORDER — LIDOCAINE HCL (CARDIAC) 20 MG/ML IV SOLN
INTRAVENOUS | Status: DC | PRN
  Administered 2017-01-06: 60 mg via INTRATRACHEAL

## 2017-01-06 MED ORDER — SODIUM CHLORIDE 0.9 % IV BOLUS (SEPSIS)
1000.0000 mL | Freq: Once | INTRAVENOUS | Status: AC
  Administered 2017-01-06: 1000 mL via INTRAVENOUS

## 2017-01-06 MED ORDER — METRONIDAZOLE IN NACL 5-0.79 MG/ML-% IV SOLN
500.0000 mg | Freq: Once | INTRAVENOUS | Status: AC
  Administered 2017-01-06: 500 mg via INTRAVENOUS
  Filled 2017-01-06: qty 100

## 2017-01-06 MED ORDER — HYDROMORPHONE HCL 1 MG/ML IJ SOLN
0.2500 mg | INTRAMUSCULAR | Status: DC | PRN
  Administered 2017-01-07: 0.5 mg via INTRAVENOUS

## 2017-01-06 MED ORDER — LIDOCAINE 2% (20 MG/ML) 5 ML SYRINGE
INTRAMUSCULAR | Status: AC
  Filled 2017-01-06: qty 5

## 2017-01-06 MED ORDER — MORPHINE SULFATE (PF) 4 MG/ML IV SOLN
4.0000 mg | Freq: Once | INTRAVENOUS | Status: AC
  Administered 2017-01-06: 4 mg via INTRAVENOUS
  Filled 2017-01-06: qty 1

## 2017-01-06 MED ORDER — FENTANYL CITRATE (PF) 100 MCG/2ML IJ SOLN
INTRAMUSCULAR | Status: AC
  Filled 2017-01-06: qty 4

## 2017-01-06 MED ORDER — FENTANYL CITRATE (PF) 100 MCG/2ML IJ SOLN
INTRAMUSCULAR | Status: DC | PRN
  Administered 2017-01-06: 100 ug via INTRAVENOUS
  Administered 2017-01-06: 50 ug via INTRAVENOUS

## 2017-01-06 MED ORDER — PROPOFOL 10 MG/ML IV BOLUS
INTRAVENOUS | Status: DC | PRN
  Administered 2017-01-06: 200 mg via INTRAVENOUS

## 2017-01-06 MED ORDER — LACTATED RINGERS IV SOLN
INTRAVENOUS | Status: DC | PRN
  Administered 2017-01-06: 23:00:00 via INTRAVENOUS

## 2017-01-06 MED ORDER — 0.9 % SODIUM CHLORIDE (POUR BTL) OPTIME
TOPICAL | Status: DC | PRN
  Administered 2017-01-06: 1000 mL

## 2017-01-06 SURGICAL SUPPLY — 43 items
APL SKNCLS STERI-STRIP NONHPOA (GAUZE/BANDAGES/DRESSINGS) ×1
APPLIER CLIP 5 13 M/L LIGAMAX5 (MISCELLANEOUS)
APR CLP MED LRG 5 ANG JAW (MISCELLANEOUS)
BENZOIN TINCTURE PRP APPL 2/3 (GAUZE/BANDAGES/DRESSINGS) ×2 IMPLANT
BLADE CLIPPER SURG (BLADE) IMPLANT
CANISTER SUCT 3000ML PPV (MISCELLANEOUS) ×2 IMPLANT
CHLORAPREP W/TINT 26ML (MISCELLANEOUS) ×2 IMPLANT
CLIP APPLIE 5 13 M/L LIGAMAX5 (MISCELLANEOUS) IMPLANT
COVER SURGICAL LIGHT HANDLE (MISCELLANEOUS) ×2 IMPLANT
COVER TRANSDUCER ULTRASND (DRAPES) ×2 IMPLANT
DEVICE TROCAR PUNCTURE CLOSURE (ENDOMECHANICALS) ×2 IMPLANT
ELECT REM PT RETURN 9FT ADLT (ELECTROSURGICAL) ×2
ELECTRODE REM PT RTRN 9FT ADLT (ELECTROSURGICAL) ×1 IMPLANT
ENDOLOOP SUT PDS II  0 18 (SUTURE) ×3
ENDOLOOP SUT PDS II 0 18 (SUTURE) ×3 IMPLANT
GAUZE SPONGE 2X2 8PLY NS (GAUZE/BANDAGES/DRESSINGS) ×1 IMPLANT
GAUZE SPONGE 2X2 8PLY STRL LF (GAUZE/BANDAGES/DRESSINGS) ×1 IMPLANT
GLOVE BIO SURGEON STRL SZ7.5 (GLOVE) ×2 IMPLANT
GOWN STRL REUS W/ TWL LRG LVL3 (GOWN DISPOSABLE) ×2 IMPLANT
GOWN STRL REUS W/ TWL XL LVL3 (GOWN DISPOSABLE) ×1 IMPLANT
GOWN STRL REUS W/TWL LRG LVL3 (GOWN DISPOSABLE) ×4
GOWN STRL REUS W/TWL XL LVL3 (GOWN DISPOSABLE) ×2
KIT BASIN OR (CUSTOM PROCEDURE TRAY) ×2 IMPLANT
KIT ROOM TURNOVER OR (KITS) ×2 IMPLANT
NDL INSUFFLATION 14GA 120MM (NEEDLE) ×1 IMPLANT
NEEDLE INSUFFLATION 14GA 120MM (NEEDLE) ×2 IMPLANT
NS IRRIG 1000ML POUR BTL (IV SOLUTION) ×2 IMPLANT
PAD ARMBOARD 7.5X6 YLW CONV (MISCELLANEOUS) ×4 IMPLANT
SCISSORS LAP 5X35 DISP (ENDOMECHANICALS) ×2 IMPLANT
SET IRRIG TUBING LAPAROSCOPIC (IRRIGATION / IRRIGATOR) ×2 IMPLANT
SLEEVE ENDOPATH XCEL 5M (ENDOMECHANICALS) ×2 IMPLANT
SPECIMEN JAR SMALL (MISCELLANEOUS) ×2 IMPLANT
SPONGE GAUZE 2X2 STER 10/PKG (GAUZE/BANDAGES/DRESSINGS) ×1
STRIP CLOSURE SKIN 1/2X4 (GAUZE/BANDAGES/DRESSINGS) ×2 IMPLANT
SUT MNCRL AB 4-0 PS2 18 (SUTURE) ×2 IMPLANT
TAPE CLOTH SURG 4X10 WHT LF (GAUZE/BANDAGES/DRESSINGS) ×1 IMPLANT
TOWEL OR 17X24 6PK STRL BLUE (TOWEL DISPOSABLE) ×2 IMPLANT
TOWEL OR 17X26 10 PK STRL BLUE (TOWEL DISPOSABLE) ×2 IMPLANT
TRAY FOLEY CATH 16FR SILVER (SET/KITS/TRAYS/PACK) ×2 IMPLANT
TRAY LAPAROSCOPIC MC (CUSTOM PROCEDURE TRAY) ×2 IMPLANT
TROCAR XCEL NON-BLD 11X100MML (ENDOMECHANICALS) ×2 IMPLANT
TROCAR XCEL NON-BLD 5MMX100MML (ENDOMECHANICALS) ×2 IMPLANT
TUBING INSUFFLATION (TUBING) ×2 IMPLANT

## 2017-01-06 NOTE — Discharge Instructions (Signed)
Concern for appendicitis vs focal colitis.  Please go to the ER now for consideration of imaging, further evaluation of your severe right lower quadrant pain.

## 2017-01-06 NOTE — ED Notes (Signed)
PT to OR- Rocephin infusing. Flagyl sent with pt. Pt wife pt belongings and is with pt.

## 2017-01-06 NOTE — Anesthesia Procedure Notes (Signed)
Procedure Name: Intubation Date/Time: 01/06/2017 11:18 PM Performed by: Valetta Fuller Pre-anesthesia Checklist: Patient identified, Emergency Drugs available, Suction available and Patient being monitored Patient Re-evaluated:Patient Re-evaluated prior to inductionOxygen Delivery Method: Circle system utilized Preoxygenation: Pre-oxygenation with 100% oxygen Intubation Type: IV induction, Rapid sequence and Cricoid Pressure applied Laryngoscope Size: Miller and 3 Grade View: Grade I Tube type: Oral Number of attempts: 1 Airway Equipment and Method: Stylet Placement Confirmation: ETT inserted through vocal cords under direct vision,  positive ETCO2 and breath sounds checked- equal and bilateral Secured at: 24 cm Tube secured with: Tape Dental Injury: Teeth and Oropharynx as per pre-operative assessment

## 2017-01-06 NOTE — ED Notes (Signed)
Patient discharged to follow up in ED. Patient and family verbalized understanding of process.

## 2017-01-06 NOTE — ED Triage Notes (Addendum)
Pt to ED from [facility] for r/o appendicitis, pt c/o RLQ abd pain and nausea since last night, denies fevers/vomiting, pain worse with movement.

## 2017-01-06 NOTE — ED Triage Notes (Signed)
The patient presented to the [facility] with a complaint of lower abdominal pain that he reports as under his umbilicus that started last night. He stated that he did have some nausea last night but not today. The patient reported normal bowel movements.

## 2017-01-06 NOTE — ED Provider Notes (Signed)
[facility]    CSN: [id number] Arrival date & time: 01/06/17  1313     History   Chief Complaint Chief Complaint  Patient presents with  . Abdominal Pain    HPI Jonathan Miles is a 56 y.o. male. He presents today with right lower quadrant pain. This began as fairly generalized abdominal discomfort last evening, and kept him up during the night. Today pain has localized in the right lower quadrant. It is mostly crampy, occasionally sharp, nonradiating. It is much worse if he is moving around, hard to get in and out of the car. He has had some nausea. No vomiting. Small but otherwise normal bowel movement last night. No urinary discomfort. Chronic urinary frequency, for which he recently started a medication trial. No fever. Did have malaise for the couple of days preceding the onset of abdominal pain. No history of abdominal surgery, still has his appendix.    HPI  Past Medical History:  Diagnosis Date  . Alcohol abuse   . Aortic root dilatation (HCC)    a. echo 4/12: EF 55-60%, mod to marked dilated ascending aortic root;   b. MRA 4/12: Aortic root 5.5 cm, dilation of left and right coronary cusps, trileaflet aortic valve  . Arthritis   . Asthma    ast attack in early 20's  . CAD (coronary artery disease)   . Chest pain    Myoview 4/12: EF 62%, no ischemia or scar  . CHF (congestive heart failure) ([place])   . Chronic pain    back and hands  . Constipation   . COPD (chronic obstructive pulmonary disease) ([place])   . Depression   . Diabetes mellitus    type 2  . Drug use   . Enlarged prostate   . Fatigue   . GERD (gastroesophageal reflux disease)    modifies with diet  . Hx of echocardiogram    Echo (2/16):  Mod LVH, EF 55-60%, mild to mod AI, mod LAE, mild RAE, Aortic root 38 mm, Asc aorta 43 mm  . Hyperlipidemia   . Hypertension   . Joint pain   . Knee pain, bilateral   . OSA (obstructive sleep apnea) 2006   .  Wears at times  . Osteoarthritis   .  Palpitations   . PONV (postoperative nausea and vomiting)   . SOB (shortness of breath)     Patient Active Problem List   Diagnosis Date Noted  . Morbid obesity ([place]) 12/17/2016  . Diverticulosis 11/24/2016  . Type 2 diabetes mellitus without complication, without long-term current use of insulin ([place]) 10/31/2016  . Aortic insufficiency 11/23/2012  . Chronic diastolic CHF (congestive heart failure) ([place]) 11/23/2012  . CAD (coronary artery disease) 11/27/2011  . OSA (obstructive sleep apnea) 11/11/2011  . Aortic root dilatation ([place])   . Chest pain 01/23/2011  . Hyperlipidemia 01/23/2011  . Hypertension 01/23/2011    Past Surgical History:  Procedure Laterality Date  . Colonscopy    . CORONARY ARTERY BYPASS GRAFT  05/07/2012   Procedure: CORONARY ARTERY BYPASS GRAFTING (CABG);  Surgeon: Grace Isaac, MD;  Location: [facility];  Service: Open Heart Surgery;  Laterality: N/A;  . KNEE ARTHROSCOPY  2010   left  . Retactment of fingers     as a child , sewed with sewing machine- Index and middle finger  . UPPER GASTROINTESTINAL ENDOSCOPY         Home Medications    Prior to Admission medications   Medication Sig  Start Date End Date Taking? Authorizing Provider  acetaminophen (TYLENOL) 500 MG tablet TAKE 1-2 TABS EVERY 6 HOURS AS NEEDED FOR PAIN 05/26/12  Yes Liliane Shi, PA-C  aspirin 81 MG tablet Take 81 mg by mouth daily.   Yes Historical Provider, MD  atorvastatin (LIPITOR) 40 MG tablet Take 1 tablet (40 mg total) by mouth daily. 10/31/16  Yes Chelle Jeffery, PA-C  Blood Glucose Monitoring Suppl (BREEZE 2 BLOOD GLUCOSE SYSTEM) DEVI 1 Package by Does not apply route 2 (two) times daily. 12/03/16  Yes Caren D Leafy Ro, MD  carvedilol (COREG) 12.5 MG tablet Take 1 tablet (12.5 mg total) by mouth 2 (two) times daily. 10/31/16  Yes Chelle Jeffery, PA-C  diclofenac sodium (VOLTAREN) 1 % GEL Apply 2 g topically 4 (four) times daily. 11/19/16  Yes Chelle Jeffery, PA-C  diphenhydrAMINE  (BENADRYL) 25 mg capsule Take 2 capsules (50 mg total) by mouth every 6 (six) hours as needed. Take 2 capsules at 8 am on Monday morning 10/15/15 10/11/15  Yes Grace Isaac, MD  felodipine (PLENDIL) 10 MG 24 hr tablet Take 1 tablet (10 mg total) by mouth daily. 10/31/16  Yes Chelle Jeffery, PA-C  furosemide (LASIX) 40 MG tablet Take 1 tablet (40 mg total) by mouth daily. 10/31/16  Yes Chelle Jeffery, PA-C  lisinopril (PRINIVIL,ZESTRIL) 20 MG tablet Take 1 tablet (20 mg total) by mouth daily. 10/31/16  Yes Chelle Jeffery, PA-C  loratadine (CLARITIN) 10 MG tablet Take 10 mg by mouth daily.   Yes Historical Provider, MD  metFORMIN (GLUCOPHAGE) 1000 MG tablet 1 bid Patient taking differently: 1,000 mg 2 (two) times daily with a meal. 1 bid 11/01/16  Yes Chelle Jeffery, PA-C  mirabegron ER (MYRBETRIQ) 50 MG TB24 tablet Take 50 mg by mouth daily.   Yes Historical Provider, MD  tamsulosin (FLOMAX) 0.4 MG CAPS capsule Take 2 capsules (0.8 mg total) by mouth daily after supper. 10/31/16  Yes Chelle Jeffery, PA-C  traMADol (ULTRAM) 50 MG tablet TAKE TWO TABLETS BY MOUTH EVERY 6 HOURS AS NEEDED 12/29/16  Yes Chelle Jeffery, PA-C  Vitamin D, Ergocalciferol, (DRISDOL) 50000 units CAPS capsule Take 1 capsule (50,000 Units total) by mouth every 7 (seven) days. 12/17/16  Yes Caren Drucie Opitz, MD    Family History Family History  Problem Relation Age of Onset  . Adopted: Yes  . Family history unknown: Yes    Social History Social History  Substance Use Topics  . Smoking status: Former Smoker    Packs/day: 1.00    Years: 13.00    Types: Cigarettes    Quit date: 01/23/1988  . Smokeless tobacco: Never Used  . Alcohol use Yes     Comment: quit 1989     Allergies   Iodinated diagnostic agents   Review of Systems Review of Systems  All other systems reviewed and are negative.    Physical Exam Triage Vital Signs ED Triage Vitals  Enc Vitals Group     BP 01/06/17 1346 147/77     Pulse Rate 01/06/17  1346 64     Resp 01/06/17 1346 20     Temp 01/06/17 1346 98.7 F (37.1 C)     Temp Source 01/06/17 1346 Oral     SpO2 01/06/17 1346 96 %     Weight --      Height --      Head Circumference --      Peak Flow --      Pain Score 01/06/17 1345 6  Pain Loc --      Pain Edu? --      Excl. in [place]? --    No data found.   Updated Vital Signs BP 147/77 (BP Location: Right Arm)   Pulse 64   Temp 98.7 F (37.1 C) (Oral)   Resp 20   SpO2 96%   Visual Acuity Right Eye Distance:   Left Eye Distance:   Bilateral Distance:    Right Eye Near:   Left Eye Near:    Bilateral Near:     Physical Exam  Constitutional: He is oriented to person, place, and time. No distress.  Alert, nicely groomed  HENT:  Head: Atraumatic.  Eyes:  Conjugate gaze, no eye redness/drainage  Neck: Neck supple.  Cardiovascular: Normal rate and regular rhythm.   Pulmonary/Chest: No respiratory distress. He has no wheezes. He has no rales.  Lungs clear, symmetric breath sounds  Abdominal: Soft. He exhibits no distension. There is no guarding.  Protuberant, marked tenderness in the right lower quadrant. Very painful with changes in position, hurts to lie back on the exam table. Palpation of the right upper quadrant increases the pain in the right lower quadrant.  Musculoskeletal: Normal range of motion.  Neurological: He is alert and oriented to person, place, and time.  Skin: Skin is warm and dry.  No cyanosis  Nursing note and vitals reviewed.    UC Treatments / Results   Procedures Procedures (including critical care time) None today  Final Clinical Impressions(s) / UC Diagnoses   Final diagnoses:  Acute right lower quadrant pain   Concern for appendicitis vs focal colitis.  Please go to the ER now for consideration of imaging, further evaluation of your severe right lower quadrant pain.      Sherlene Shams, MD 01/06/17 [phone]

## 2017-01-06 NOTE — H&P (Signed)
Jonathan Miles is an 56 y.o. male.   Chief Complaint: Abdominal pain HPI: Patient is a 56 year old morbidly obese male who has a 24-hour history of abdominal pain. Patient states that this started in the lower genital area. He states been more constant in the right lower quadrant. He states he had associated nausea with no emesis. He's had no diarrhea. No fevers at home. Patient presented to the ER for further evaluation secondary to pain  Final evaluation ER the patient was CT scan and laboratory studies. Patient did have an elevated WBC count. Patient also had a CT consistent with acute nonperforated appendicitis.  General surgery was consult for further evaluation and management.  Past Medical History:  Diagnosis Date  . Alcohol abuse   . Aortic root dilatation (HCC)    a. echo 4/12: EF 55-60%, mod to marked dilated ascending aortic root;   b. MRA 4/12: Aortic root 5.5 cm, dilation of left and right coronary cusps, trileaflet aortic valve  . Arthritis   . Asthma    ast attack in early 20's  . CAD (coronary artery disease)   . Chest pain    Myoview 4/12: EF 62%, no ischemia or scar  . CHF (congestive heart failure) ([place])   . Chronic pain    back and hands  . Constipation   . COPD (chronic obstructive pulmonary disease) ([place])   . Depression   . Diabetes mellitus    type 2  . Drug use   . Enlarged prostate   . Fatigue   . GERD (gastroesophageal reflux disease)    modifies with diet  . Hx of echocardiogram    Echo (2/16):  Mod LVH, EF 55-60%, mild to mod AI, mod LAE, mild RAE, Aortic root 38 mm, Asc aorta 43 mm  . Hyperlipidemia   . Hypertension   . Joint pain   . Knee pain, bilateral   . OSA (obstructive sleep apnea) 2006   .  Wears at times  . Osteoarthritis   . Palpitations   . PONV (postoperative nausea and vomiting)   . SOB (shortness of breath)     Past Surgical History:  Procedure Laterality Date  . Colonscopy    . CORONARY ARTERY BYPASS GRAFT  05/07/2012   Procedure: CORONARY ARTERY BYPASS GRAFTING (CABG);  Surgeon: Grace Isaac, MD;  Location: [facility];  Service: Open Heart Surgery;  Laterality: N/A;  . KNEE ARTHROSCOPY  2010   left  . Retactment of fingers     as a child , sewed with sewing machine- Index and middle finger  . UPPER GASTROINTESTINAL ENDOSCOPY      Family History  Problem Relation Age of Onset  . Adopted: Yes  . Family history unknown: Yes   Social History:  reports that he quit smoking about 28 years ago. His smoking use included Cigarettes. He has a 13.00 pack-year smoking history. He has never used smokeless tobacco. He reports that he drinks alcohol. He reports that he uses drugs, including Cocaine and Marijuana.  Allergies:  Allergies  Allergen Reactions  . Ibuprofen Other (See Comments)    TOLD NOT TO TAKE-PER CARDS  . Iodinated Diagnostic Agents Hives    Hives started 4 days after cta chest was performed, minimal relief w/ benadryl x 3 days, uncertain if reaction was actually iv contrast or other allergen,allergy tests to follow.wife will make [place] aware of results//a.[email or web address]     (Not in a hospital admission)  Results for orders placed or performed during the  hospital encounter of 01/06/17 (from the past 48 hour(s))  Lipase, blood     Status: None   Collection Time: 01/06/17  5:10 PM  Result Value Ref Range   Lipase 16 11 - 51 U/L  Comprehensive metabolic panel     Status: Abnormal   Collection Time: 01/06/17  5:10 PM  Result Value Ref Range   Sodium 141 135 - 145 mmol/L   Potassium 3.6 3.5 - 5.1 mmol/L   Chloride 104 101 - 111 mmol/L   CO2 24 22 - 32 mmol/L   Glucose, Bld 104 (H) 65 - 99 mg/dL   BUN 11 6 - 20 mg/dL   Creatinine, Ser 0.92 0.61 - 1.24 mg/dL   Calcium 9.4 8.9 - 10.3 mg/dL   Total Protein 7.3 6.5 - 8.1 g/dL   Albumin 4.2 3.5 - 5.0 g/dL   AST 12 (L) 15 - 41 U/L   ALT 21 17 - 63 U/L   Alkaline Phosphatase 64 38 - 126 U/L   Total Bilirubin 0.7 0.3 - 1.2 mg/dL   GFR calc non Af Amer >60  >60 mL/min   GFR calc Af Amer >60 >60 mL/min    Comment: (NOTE) The eGFR has been calculated using the CKD EPI equation. This calculation has not been validated in all clinical situations. eGFR's persistently <60 mL/min signify possible Chronic Kidney Disease.    Anion gap 13 5 - 15  CBC     Status: Abnormal   Collection Time: 01/06/17  5:10 PM  Result Value Ref Range   WBC 12.4 (H) 4.0 - 10.5 K/uL   RBC 5.01 4.22 - 5.81 MIL/uL   Hemoglobin 14.4 13.0 - 17.0 g/dL   HCT 42.5 39.0 - 52.0 %   MCV 84.8 78.0 - 100.0 fL   MCH 28.7 26.0 - 34.0 pg   MCHC 33.9 30.0 - 36.0 g/dL   RDW 14.2 11.5 - 15.5 %   Platelets 299 150 - 400 K/uL  Urinalysis, Routine w reflex microscopic     Status: None   Collection Time: 01/06/17  5:10 PM  Result Value Ref Range   Color, Urine YELLOW YELLOW   APPearance CLEAR CLEAR   Specific Gravity, Urine 1.013 1.005 - 1.030   pH 5.0 5.0 - 8.0   Glucose, UA NEGATIVE NEGATIVE mg/dL   Hgb urine dipstick NEGATIVE NEGATIVE   Bilirubin Urine NEGATIVE NEGATIVE   Ketones, ur NEGATIVE NEGATIVE mg/dL   Protein, ur NEGATIVE NEGATIVE mg/dL   Nitrite NEGATIVE NEGATIVE   Leukocytes, UA NEGATIVE NEGATIVE   Ct Abdomen Pelvis Wo Contrast  Result Date: 01/06/2017 CLINICAL DATA:  Right lower quadrant abdominal pain for 1 day. Nausea and vomiting. History of contrast allergy. EXAM: CT ABDOMEN AND PELVIS WITHOUT CONTRAST TECHNIQUE: Multidetector CT imaging of the abdomen and pelvis was performed following the standard protocol without IV contrast. COMPARISON:  CT chest 10/15/2015 FINDINGS: Lower chest: Postoperative changes in the mediastinum. Coronary artery calcifications. Calcified left hilar lymph nodes. Calcified granuloma in the left base. Hepatobiliary: Cholelithiasis without inflammatory changes in the gallbladder. No bile duct dilatation. No focal liver lesion demonstrated on noncontrast imaging. Scattered hepatic calcified granulomas. Pancreas: Unremarkable. No pancreatic  ductal dilatation or surrounding inflammatory changes. Spleen: Calcified granulomas in the spleen.  No splenomegaly. Adrenals/Urinary Tract: Adrenal glands are unremarkable. Kidneys are normal, without renal calculi, focal lesion, or hydronephrosis. Bladder is unremarkable. Stomach/Bowel: The appendix is mildly distended at 9 mm diameter. Inflammatory stranding and edema in the fat around the appendix. Changes  are consistent with early acute appendicitis. No abscess. Prominent lymph nodes in the right lower quadrant are likely reactive. Stomach and small bowel are decompressed, limiting evaluation. No evidence of obstruction. Colon is mostly decompressed with scattered gas and stool. Vascular/Lymphatic: Aortic atherosclerosis. No enlarged abdominal or pelvic lymph nodes. Reproductive: Prostate gland is enlarged, measuring 5.6 cm diameter. Other: No abdominal wall hernia or abnormality. No abdominopelvic ascites. Musculoskeletal: Degenerative changes in the spine. No destructive bone lesions. IMPRESSION: Changes of acute appendicitis with mild appendiceal distention and stranding in the right lower quadrant fat. No abscess. Cholelithiasis. Electronically Signed   By: Lucienne Capers M.D.   On: 01/06/2017 21:37    Review of Systems  Constitutional: Negative for chills, fever, malaise/fatigue and weight loss.  HENT: Negative for ear discharge, ear pain, hearing loss, nosebleeds and tinnitus.   Eyes: Negative for blurred vision, double vision, photophobia and pain.  Respiratory: Negative for cough, hemoptysis, sputum production and shortness of breath.   Cardiovascular: Negative for chest pain, palpitations, orthopnea, claudication and leg swelling.  Gastrointestinal: Positive for abdominal pain and nausea. Negative for constipation, diarrhea, heartburn and vomiting.  Musculoskeletal: Negative for back pain, myalgias and neck pain.  Neurological: Negative for dizziness, tremors, sensory change and headaches.   Psychiatric/Behavioral: Negative for depression, substance abuse and suicidal ideas.    Blood pressure 132/78, pulse 65, temperature 98.1 F (36.7 C), temperature source Oral, resp. rate 18, height 6' 3.75" (1.924 m), weight (!) 145.2 kg (320 lb 2 oz), SpO2 95 %. Physical Exam  Constitutional: He is oriented to person, place, and time. He appears well-developed and well-nourished. No distress.  HENT:  Head: Normocephalic and atraumatic.  Right Ear: External ear normal.  Left Ear: External ear normal.  Eyes: Conjunctivae and EOM are normal. Pupils are equal, round, and reactive to light. Right eye exhibits no discharge. Left eye exhibits no discharge. No scleral icterus.  Neck: Normal range of motion. Neck supple. No tracheal deviation present. No thyromegaly present.  Cardiovascular: Normal rate, regular rhythm, normal heart sounds and intact distal pulses.  Exam reveals no gallop and no friction rub.   No murmur heard. Respiratory: Effort normal and breath sounds normal. No stridor. No respiratory distress. He has no wheezes. He has no rales. He exhibits no tenderness.  GI: Soft. Bowel sounds are normal. He exhibits no distension. There is tenderness. There is tenderness at McBurney's point. There is no rebound and no guarding.  Musculoskeletal: Normal range of motion. He exhibits deformity. He exhibits no edema or tenderness.  Lymphadenopathy:    He has no cervical adenopathy.  Neurological: He is alert and oriented to person, place, and time.  Skin: Skin is warm. No rash noted. He is not diaphoretic. No erythema.     Assessment/Plan 56 yo M with acute appendicitis HLD OSA HTN CAD CHF DM Aortic insuffieciency  1. We will proceed to the operating room for laparoscopic appendectomy. 2. I discussed with the patient risks and benefits of the procedure to include but not limited to: Infection, bleeding, demonstrating shocks, possible postoperative abscess. The patient voiced  understanding and wishes to proceed.   Reyes Ivan, MD 01/06/2017, 10:11 PM

## 2017-01-06 NOTE — Anesthesia Preprocedure Evaluation (Addendum)
Anesthesia Evaluation  Patient identified by MRN, date of birth, ID band Patient awake    Reviewed: Allergy & Precautions, NPO status , Patient's Chart, lab work & pertinent test results, reviewed documented beta blocker date and time   Airway Mallampati: III  TM Distance: >3 FB Neck ROM: Full    Dental  (+) Dental Advisory Given   Pulmonary asthma , sleep apnea , COPD, former smoker,    breath sounds clear to auscultation       Cardiovascular hypertension, Pt. on medications and Pt. on home beta blockers + CAD, + CABG, + Peripheral Vascular Disease and +CHF   Rhythm:Regular Rate:Normal     Neuro/Psych negative neurological ROS     GI/Hepatic Neg liver ROS, GERD  ,Acute appendicitis   Endo/Other  diabetes, Type 2  Renal/GU negative Renal ROS     Musculoskeletal  (+) Arthritis ,   Abdominal   Peds  Hematology negative hematology ROS (+)   Anesthesia Other Findings   Reproductive/Obstetrics                            Lab Results  Component Value Date   WBC 12.4 (H) 01/06/2017   HGB 14.4 01/06/2017   HCT 42.5 01/06/2017   MCV 84.8 01/06/2017   PLT 299 01/06/2017   Lab Results  Component Value Date   CREATININE 0.92 01/06/2017   BUN 11 01/06/2017   NA 141 01/06/2017   K 3.6 01/06/2017   CL 104 01/06/2017   CO2 24 01/06/2017    Anesthesia Physical Anesthesia Plan  ASA: III  Anesthesia Plan: General   Post-op Pain Management:    Induction: Intravenous  Airway Management Planned: Oral ETT  Additional Equipment:   Intra-op Plan:   Post-operative Plan: Extubation in OR  Informed Consent: I have reviewed the patients History and Physical, chart, labs and discussed the procedure including the risks, benefits and alternatives for the proposed anesthesia with the patient or authorized representative who has indicated his/her understanding and acceptance.   Dental advisory  given  Plan Discussed with:   Anesthesia Plan Comments:         Anesthesia Quick Evaluation

## 2017-01-06 NOTE — ED Notes (Signed)
Bed: UC03 Expected date: 01/06/17 Expected time: 12:42 PM Means of arrival:  Comments:

## 2017-01-06 NOTE — ED Provider Notes (Signed)
[facility] DEPT Provider Note   CSN: [id number] Arrival date & time: 01/06/17  1557     History   Chief Complaint Chief Complaint  Patient presents with  . Abdominal Pain    HPI Jonathan Miles is a 56 y.o. male.  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 He presents for evaluation of right lower quadrant pain. This started yesterday evening and worsened through the night. He initially went to urgent care, then was sent here for rule out of appendicitis. He denies any fevers or chills. He denies any bloody stools. He does report decreased appetite.   The history is provided by the patient.  Abdominal Pain   This is a new problem. The current episode started yesterday. The problem occurs constantly. The problem has been gradually worsening. The pain is located in the RLQ. The pain is moderate. Pertinent negatives include fever. Exacerbated by: Movement and palpation. Nothing relieves the symptoms.    Past Medical History:  Diagnosis Date  . Alcohol abuse   . Aortic root dilatation (HCC)    a. echo 4/12: EF 55-60%, mod to marked dilated ascending aortic root;   b. MRA 4/12: Aortic root 5.5 cm, dilation of left and right coronary cusps, trileaflet aortic valve  . Arthritis   . Asthma    ast attack in early 20's  . CAD (coronary artery disease)   . Chest pain    Myoview 4/12: EF 62%, no ischemia or scar  . CHF (congestive heart failure) ([place])   . Chronic pain    back and hands  . Constipation   . COPD (chronic obstructive pulmonary disease) ([place])   . Depression   . Diabetes mellitus    type 2  . Drug use   . Enlarged prostate   . Fatigue   . GERD (gastroesophageal reflux disease)    modifies with diet  . Hx of echocardiogram    Echo (2/16):  Mod LVH, EF 55-60%, mild to mod AI, mod LAE, mild RAE, Aortic root 38 mm, Asc aorta 43 mm  . Hyperlipidemia   . Hypertension   . Joint  pain   . Knee pain, bilateral   . OSA (obstructive sleep apnea) 2006   .  Wears at times  . Osteoarthritis   . Palpitations   . PONV (postoperative nausea and vomiting)   . SOB (shortness of breath)     Patient Active Problem List   Diagnosis Date Noted  . Morbid obesity ([place]) 12/17/2016  . Diverticulosis 11/24/2016  . Type 2 diabetes mellitus without complication, without long-term current use of insulin ([place]) 10/31/2016  . Aortic insufficiency 11/23/2012  . Chronic diastolic CHF (congestive heart failure) ([place]) 11/23/2012  . CAD (coronary artery disease) 11/27/2011  . OSA (obstructive sleep apnea) 11/11/2011  . Aortic root dilatation ([place])   . Chest pain 01/23/2011  . Hyperlipidemia 01/23/2011  . Hypertension 01/23/2011    Past Surgical History:  Procedure Laterality Date  . Colonscopy    . CORONARY ARTERY BYPASS GRAFT  05/07/2012   Procedure: CORONARY ARTERY BYPASS GRAFTING (CABG);  Surgeon: Grace Isaac, MD;  Location: [facility];  Service: Open Heart Surgery;  Laterality: N/A;  . KNEE ARTHROSCOPY  2010   left  . Retactment of fingers     as a child , sewed with sewing machine- Index and middle finger  . UPPER GASTROINTESTINAL ENDOSCOPY  Home Medications    Prior to Admission medications   Medication Sig Start Date End Date Taking? Authorizing Provider  acetaminophen (TYLENOL) 500 MG tablet TAKE 1-2 TABS EVERY 6 HOURS AS NEEDED FOR PAIN 05/26/12   Liliane Shi, PA-C  aspirin 81 MG tablet Take 81 mg by mouth daily.    Historical Provider, MD  atorvastatin (LIPITOR) 40 MG tablet Take 1 tablet (40 mg total) by mouth daily. 10/31/16   Chelle Jeffery, PA-C  Blood Glucose Monitoring Suppl (BREEZE 2 BLOOD GLUCOSE SYSTEM) DEVI 1 Package by Does not apply route 2 (two) times daily. 12/03/16   Caren Drucie Opitz, MD  carvedilol (COREG) 12.5 MG tablet Take 1 tablet (12.5 mg total) by mouth 2 (two) times daily. 10/31/16   Chelle Jeffery, PA-C  diclofenac sodium (VOLTAREN) 1 %  GEL Apply 2 g topically 4 (four) times daily. 11/19/16   Chelle Jeffery, PA-C  diphenhydrAMINE (BENADRYL) 25 mg capsule Take 2 capsules (50 mg total) by mouth every 6 (six) hours as needed. Take 2 capsules at 8 am on Monday morning 10/15/15 10/11/15   Grace Isaac, MD  felodipine (PLENDIL) 10 MG 24 hr tablet Take 1 tablet (10 mg total) by mouth daily. 10/31/16   Chelle Jeffery, PA-C  furosemide (LASIX) 40 MG tablet Take 1 tablet (40 mg total) by mouth daily. 10/31/16   Chelle Jeffery, PA-C  lisinopril (PRINIVIL,ZESTRIL) 20 MG tablet Take 1 tablet (20 mg total) by mouth daily. 10/31/16   Chelle Jeffery, PA-C  loratadine (CLARITIN) 10 MG tablet Take 10 mg by mouth daily.    Historical Provider, MD  metFORMIN (GLUCOPHAGE) 1000 MG tablet 1 bid Patient taking differently: 1,000 mg 2 (two) times daily with a meal. 1 bid 11/01/16   Chelle Jeffery, PA-C  mirabegron ER (MYRBETRIQ) 50 MG TB24 tablet Take 50 mg by mouth daily.    Historical Provider, MD  tamsulosin (FLOMAX) 0.4 MG CAPS capsule Take 2 capsules (0.8 mg total) by mouth daily after supper. 10/31/16   Chelle Jeffery, PA-C  traMADol (ULTRAM) 50 MG tablet TAKE TWO TABLETS BY MOUTH EVERY 6 HOURS AS NEEDED 12/29/16   Chelle Jeffery, PA-C  Vitamin D, Ergocalciferol, (DRISDOL) 50000 units CAPS capsule Take 1 capsule (50,000 Units total) by mouth every 7 (seven) days. 12/17/16   Caren Drucie Opitz, MD    Family History Family History  Problem Relation Age of Onset  . Adopted: Yes  . Family history unknown: Yes    Social History Social History  Substance Use Topics  . Smoking status: Former Smoker    Packs/day: 1.00    Years: 13.00    Types: Cigarettes    Quit date: 01/23/1988  . Smokeless tobacco: Never Used  . Alcohol use Yes     Comment: quit 1989     Allergies   Iodinated diagnostic agents   Review of Systems Review of Systems  Constitutional: Negative for fever.  Gastrointestinal: Positive for abdominal pain.  All other systems reviewed  and are negative.    Physical Exam Updated Vital Signs BP 132/78 (BP Location: Right Arm)   Pulse 65   Temp 98.1 F (36.7 C) (Oral)   Resp 18   Ht 6' 3.75" (1.924 m)   Wt (!) 320 lb 2 oz (145.2 kg)   SpO2 95%   BMI 39.22 kg/m   Physical Exam  Constitutional: He is oriented to person, place, and time. He appears well-developed and well-nourished. No distress.  HENT:  Head: Normocephalic and atraumatic.  Mouth/Throat: Oropharynx is clear and moist.  Neck: Normal range of motion. Neck supple.  Cardiovascular: Normal rate and regular rhythm.  Exam reveals no friction rub.   No murmur heard. Pulmonary/Chest: Effort normal and breath sounds normal. No respiratory distress. He has no wheezes. He has no rales.  Abdominal: Soft. Bowel sounds are normal. He exhibits no distension. There is tenderness.  There is TTP in the right lower quadrant.  Musculoskeletal: Normal range of motion. He exhibits no edema.  Neurological: He is alert and oriented to person, place, and time. Coordination normal.  Skin: Skin is warm and dry. He is not diaphoretic.  Nursing note and vitals reviewed.    ED Treatments / Results  Labs (all labs ordered are listed, but only abnormal results are displayed) Labs Reviewed  COMPREHENSIVE METABOLIC PANEL - Abnormal; Notable for the following:       Result Value   Glucose, Bld 104 (*)    AST 12 (*)    All other components within normal limits  CBC - Abnormal; Notable for the following:    WBC 12.4 (*)    All other components within normal limits  LIPASE, BLOOD  URINALYSIS, ROUTINE W REFLEX MICROSCOPIC    EKG  EKG Interpretation None       Radiology No results found.  Procedures Procedures (including critical care time)  Medications Ordered in ED Medications  sodium chloride 0.9 % bolus 1,000 mL (not administered)  ondansetron (ZOFRAN) injection 4 mg (not administered)  morphine 4 MG/ML injection 4 mg (not administered)     Initial  Impression / Assessment and Plan / ED Course  I have reviewed the triage vital signs and the nursing notes.  Pertinent labs & imaging results that were available during my care of the patient were reviewed by me and considered in my medical decision making (see chart for details).  Patient presents with right lower quadrant pain, elevated white count, and CT scan which is diagnostic of acute appendicitis. He will be given Rocephin and Flagyl. I've spoken with Dr. Rosendo Gros from general surgery who will evaluate patient in the ER.  Final Clinical Impressions(s) / ED Diagnoses   Final diagnoses:  None    New Prescriptions New Prescriptions   No medications on file     Veryl Speak, MD 01/06/17 2150

## 2017-01-07 ENCOUNTER — Encounter: Payer: Self-pay | Admitting: General Surgery

## 2017-01-07 ENCOUNTER — Encounter (HOSPITAL_COMMUNITY): Payer: Self-pay | Admitting: General Surgery

## 2017-01-07 DIAGNOSIS — Z9049 Acquired absence of other specified parts of digestive tract: Secondary | ICD-10-CM

## 2017-01-07 LAB — GLUCOSE, CAPILLARY
Glucose-Capillary: 102 mg/dL — ABNORMAL HIGH (ref 65–99)
Glucose-Capillary: 143 mg/dL — ABNORMAL HIGH (ref 65–99)
Glucose-Capillary: 184 mg/dL — ABNORMAL HIGH (ref 65–99)
Glucose-Capillary: 85 mg/dL (ref 65–99)

## 2017-01-07 MED ORDER — OXYCODONE HCL 5 MG PO TABS
5.0000 mg | ORAL_TABLET | ORAL | Status: DC | PRN
  Administered 2017-01-07 (×3): 10 mg via ORAL
  Filled 2017-01-07 (×3): qty 2

## 2017-01-07 MED ORDER — TAMSULOSIN HCL 0.4 MG PO CAPS
0.8000 mg | ORAL_CAPSULE | Freq: Every day | ORAL | Status: DC
  Administered 2017-01-07: 0.8 mg via ORAL
  Filled 2017-01-07: qty 2

## 2017-01-07 MED ORDER — ASPIRIN EC 81 MG PO TBEC
81.0000 mg | DELAYED_RELEASE_TABLET | Freq: Every day | ORAL | Status: DC
  Administered 2017-01-07: 81 mg via ORAL
  Filled 2017-01-07: qty 1

## 2017-01-07 MED ORDER — HYDROMORPHONE HCL 2 MG/ML IJ SOLN
1.0000 mg | INTRAMUSCULAR | Status: DC | PRN
  Administered 2017-01-07: 1 mg via INTRAVENOUS
  Filled 2017-01-07: qty 1

## 2017-01-07 MED ORDER — FELODIPINE ER 10 MG PO TB24
10.0000 mg | ORAL_TABLET | Freq: Every day | ORAL | Status: DC
  Filled 2017-01-07: qty 1

## 2017-01-07 MED ORDER — HYDROMORPHONE HCL 1 MG/ML IJ SOLN
INTRAMUSCULAR | Status: AC
  Filled 2017-01-07: qty 0.5

## 2017-01-07 MED ORDER — OXYCODONE HCL 5 MG PO TABS
5.0000 mg | ORAL_TABLET | ORAL | 0 refills | Status: DC | PRN
Start: 2017-01-07 — End: 2017-10-23

## 2017-01-07 MED ORDER — CARVEDILOL 12.5 MG PO TABS
12.5000 mg | ORAL_TABLET | Freq: Two times a day (BID) | ORAL | Status: DC
  Administered 2017-01-07 (×2): 12.5 mg via ORAL
  Filled 2017-01-07 (×2): qty 1

## 2017-01-07 MED ORDER — TRAMADOL HCL 50 MG PO TABS: 100.0000 mg | ORAL_TABLET | Freq: Four times a day (QID) | ORAL | Status: DC | PRN

## 2017-01-07 MED ORDER — ONDANSETRON HCL 4 MG/2ML IJ SOLN: 4.0000 mg | Freq: Four times a day (QID) | INTRAMUSCULAR | Status: DC | PRN

## 2017-01-07 MED ORDER — LISINOPRIL 20 MG PO TABS
20.0000 mg | ORAL_TABLET | Freq: Every day | ORAL | Status: DC
  Administered 2017-01-07: 20 mg via ORAL
  Filled 2017-01-07: qty 1

## 2017-01-07 MED ORDER — DEXTROSE-NACL 5-0.9 % IV SOLN
INTRAVENOUS | Status: DC
  Administered 2017-01-07: 02:00:00 via INTRAVENOUS

## 2017-01-07 MED ORDER — BUPIVACAINE HCL 0.25 % IJ SOLN
INTRAMUSCULAR | Status: DC | PRN
  Administered 2017-01-07: 4 mL

## 2017-01-07 MED ORDER — ONDANSETRON 4 MG PO TBDP
4.0000 mg | ORAL_TABLET | Freq: Four times a day (QID) | ORAL | Status: DC | PRN
  Filled 2017-01-07: qty 1

## 2017-01-07 MED ORDER — FUROSEMIDE 40 MG PO TABS
40.0000 mg | ORAL_TABLET | Freq: Every day | ORAL | Status: DC
  Administered 2017-01-07: 40 mg via ORAL
  Filled 2017-01-07: qty 1

## 2017-01-07 MED ORDER — SUGAMMADEX SODIUM 500 MG/5ML IV SOLN
INTRAVENOUS | Status: DC | PRN
  Administered 2017-01-07: 400 mg via INTRAVENOUS

## 2017-01-07 MED ORDER — MIRABEGRON ER 25 MG PO TB24: 50.0000 mg | ORAL_TABLET | Freq: Every day | ORAL | Status: DC

## 2017-01-07 MED ORDER — METFORMIN HCL 500 MG PO TABS
1000.0000 mg | ORAL_TABLET | Freq: Two times a day (BID) | ORAL | Status: DC
  Administered 2017-01-07: 1000 mg via ORAL
  Filled 2017-01-07: qty 2

## 2017-01-07 MED ORDER — MIRABEGRON ER 25 MG PO TB24
50.0000 mg | ORAL_TABLET | Freq: Every day | ORAL | Status: DC
  Filled 2017-01-07: qty 2

## 2017-01-07 NOTE — Discharge Summary (Signed)
Physician Discharge Summary  Patient ID: Jonathan Miles MRN: [id number] DOB/AGE: 1961/05/01 56 y.o.  Admit date: 01/06/2017 Discharge date: 01/07/2017  Admission Diagnoses:  Acute appendicitis with possible perforation OSA HTN CAD CHF Body mass index is 39.22 kg/m. DM Aortic insufficiency HLD Chronic knee pain  Discharge Diagnoses:  Acute appendicitis without perforation OSA HTN CAD CHF Body mass index is 39.22 kg/m. DM Aortic insufficiency HLD Chronic knee pain   Active Problems:   S/P appendectomy   PROCEDURES: APPENDECTOMY LAPAROSCOPIC, 01/06/17,  Dr. Skeet Simmer Course:  Patient is a 56 year old morbidly obese male who has a 24-hour history of abdominal pain. Patient states that this started in the lower genital area. He states been more constant in the right lower quadrant. He states he had associated nausea with no emesis. He's had no diarrhea. No fevers at home. Patient presented to the ER for further evaluation secondary to pain Final evaluation ER the patient was CT scan and laboratory studies. Patient did have an elevated WBC count. Patient also had a CT consistent with acute nonperforated appendicitis. He was seen and taken to the OR later that evening.  He did well with surgery and returned to the floor. His diet has been advanced.  We are mobilizing him and plan discharge this afternoon.  He will resume preop meds, he cannot take NSAID, he take tylenol and Tramadol for chronic knee pain.  We will add some Oxycodone I have personally reviewed the patients medication history on the [place] controlled substance database.  CBC Latest Ref Rng & Units 01/06/2017 10/31/2016 10/06/2015  WBC 4.0 - 10.5 K/uL 12.4(H) 9.8 11.5(A)  Hemoglobin 13.0 - 17.0 g/dL 14.4 - 15.3  Hematocrit 39.0 - 52.0 % 42.5 40.3 45.1  Platelets 150 - 400 K/uL 299 307 -    CMP Latest Ref Rng & Units 01/06/2017 10/31/2016 02/16/2016  Glucose 65 - 99 mg/dL 104(H) 134(H) 116(H)  BUN 6 -  20 mg/dL 11 16 14   Creatinine 0.61 - 1.24 mg/dL 0.92 0.85 0.89  Sodium 135 - 145 mmol/L 141 141 141  Potassium 3.5 - 5.1 mmol/L 3.6 4.0 4.0  Chloride 101 - 111 mmol/L 104 100 104  CO2 22 - 32 mmol/L 24 26 26   Calcium 8.9 - 10.3 mg/dL 9.4 10.0 9.7  Total Protein 6.5 - 8.1 g/dL 7.3 7.0 7.0  Total Bilirubin 0.3 - 1.2 mg/dL 0.7 0.3 0.6  Alkaline Phos 38 - 126 U/L 64 77 61  AST 15 - 41 U/L 12(L) 13 9(L)  ALT 17 - 63 U/L 21 20 16    Condition on d/c:  Improved    Disposition: 01-Home or Self Care   Allergies as of 01/07/2017      Reactions   Ibuprofen Other (See Comments)   TOLD NOT TO TAKE-PER CARDS   Iodinated Diagnostic Agents Hives   Hives started 4 days after cta chest was performed, minimal relief w/ benadryl x 3 days, uncertain if reaction was actually iv contrast or other allergen,allergy tests to follow.wife will make [place] aware of results//a.[email or web address]      Medication List    TAKE these medications   acetaminophen 500 MG tablet Commonly known as:  TYLENOL TAKE 1-2 TABS EVERY 6 HOURS AS NEEDED FOR PAIN What changed:  how much to take  how to take this  when to take this  reasons to take this  additional instructions   aspirin 81 MG tablet Take 81 mg by mouth daily.   atorvastatin 40 MG tablet  Commonly known as:  LIPITOR Take 1 tablet (40 mg total) by mouth daily.   carvedilol 12.5 MG tablet Commonly known as:  COREG Take 1 tablet (12.5 mg total) by mouth 2 (two) times daily.   diclofenac sodium 1 % Gel Commonly known as:  VOLTAREN Apply 2 g topically 4 (four) times daily. What changed:  when to take this  reasons to take this   felodipine 10 MG 24 hr tablet Commonly known as:  PLENDIL Take 1 tablet (10 mg total) by mouth daily.   furosemide 40 MG tablet Commonly known as:  LASIX Take 1 tablet (40 mg total) by mouth daily.   lisinopril 20 MG tablet Commonly known as:  PRINIVIL,ZESTRIL Take 1 tablet (20 mg total) by mouth daily.   loratadine 10 MG  tablet Commonly known as:  CLARITIN Take 10 mg by mouth daily.   metFORMIN 1000 MG tablet Commonly known as:  GLUCOPHAGE 1 bid What changed:  how much to take  when to take this  additional instructions   MYRBETRIQ 50 MG Tb24 tablet Generic drug:  mirabegron ER Take 50 mg by mouth daily.   oxyCODONE 5 MG immediate release tablet Commonly known as:  Oxy IR/ROXICODONE Take 1-2 tablets (5-10 mg total) by mouth every 4 (four) hours as needed for moderate pain.   tamsulosin 0.4 MG Caps capsule Commonly known as:  FLOMAX Take 2 capsules (0.8 mg total) by mouth daily after supper.   traMADol 50 MG tablet Commonly known as:  ULTRAM TAKE TWO TABLETS BY MOUTH EVERY 6 HOURS AS NEEDED What changed:  See the new instructions.   Vitamin D (Ergocalciferol) 50000 units Caps capsule Commonly known as:  DRISDOL Take 1 capsule (50,000 Units total) by mouth every 7 (seven) days. What changed:  when to take this      [place] Follow up on 02/03/2017.   Specialty:  General Surgery Why:  Follow up appointment is at 9:30 AM. Be at the office 30 minutes early for check in. Bring photo ID and insurance information.   Contact information: [address] [place] [address] [phone]        Harrison Mons, PA-C Follow up.   Specialty:  Family Medicine Why:  Call and let him know you had surgery, follow up for Medical issues.   Contact information: [address]  [place] [address] [phone]        Loralie Champagne, MD Follow up.   Specialty:  Cardiology Why:  Call and follow up for issues with your heart.   Contact information: [address] [place] [address] [phone]           Signed: Earnstine Regal 01/12/2017, 5:38 PM

## 2017-01-07 NOTE — Transfer of Care (Signed)
Immediate Anesthesia Transfer of Care Note  Patient: Jonathan Miles  Procedure(s) Performed: Procedure(s): APPENDECTOMY LAPAROSCOPIC (N/A)  Patient Location: PACU  Anesthesia Type:General  Level of Consciousness: awake, alert  and oriented  Airway & Oxygen Therapy: Patient connected to nasal cannula oxygen  Post-op Assessment: Report given to RN, Post -op Vital signs reviewed and stable and Patient moving all extremities X 4  Post vital signs: Reviewed and stable  Last Vitals:  Vitals:   01/06/17 2245 01/07/17 0018  BP: 131/78   Pulse: 65   Resp: 18   Temp:  36.3 C    Last Pain:  Vitals:   01/06/17 2255  TempSrc:   PainSc: 5          Complications: No apparent anesthesia complications

## 2017-01-07 NOTE — Discharge Instructions (Signed)
[facility] ______[facility] °LAPAROSCOPIC SURGERY: POST OP INSTRUCTIONS °Always review your discharge instruction sheet given to you by the facility where your surgery was performed. °IF YOU HAVE DISABILITY OR FAMILY LEAVE FORMS, YOU MUST BRING THEM TO THE OFFICE FOR PROCESSING.   °DO NOT GIVE THEM TO YOUR DOCTOR. ° °1. A prescription for pain medication may be given to you upon discharge.  Take your pain medication as prescribed, if needed.  If narcotic pain medicine is not needed, then you may take acetaminophen (Tylenol) or ibuprofen (Advil) as needed. °2. Take your usually prescribed medications unless otherwise directed. °3. If you need a refill on your pain medication, please contact your pharmacy.  They will contact our office to request authorization. Prescriptions will not be filled after 5pm or on week-ends. °4. You should follow a light diet the first few days after arrival home, such as soup and crackers, etc.  Be sure to include lots of fluids daily. °5. Most patients will experience some swelling and bruising in the area of the incisions.  Ice packs will help.  Swelling and bruising can take several days to resolve.  °6. It is common to experience some constipation if taking pain medication after surgery.  Increasing fluid intake and taking a stool softener (such as Colace) will usually help or prevent this problem from occurring.  A mild laxative (Milk of Magnesia or Miralax) should be taken according to package instructions if there are no bowel movements after 48 hours. °7. Unless discharge instructions indicate otherwise, you may remove your bandages 24-48 hours after surgery, and you may shower at that time.  You may have steri-strips (small skin tapes) in place directly over the incision.  These strips should be left on the skin for 7-10 days.  If your surgeon used skin glue on the incision, you may shower in 24 hours.  The glue will flake off over the next 2-3 weeks.  Any sutures or  staples will be removed at the office during your follow-up visit. °8. ACTIVITIES:  You may resume regular (light) daily activities beginning the next day--such as daily self-care, walking, climbing stairs--gradually increasing activities as tolerated.  You may have sexual intercourse when it is comfortable.  Refrain from any heavy lifting or straining until approved by your doctor. °a. You may drive when you are no longer taking prescription pain medication, you can comfortably wear a seatbelt, and you can safely maneuver your car and apply brakes. °b. RETURN TO WORK:  __________________________________________________________ °9. You should see your doctor in the office for a follow-up appointment approximately 2-3 weeks after your surgery.  Make sure that you call for this appointment within a day or two after you arrive home to insure a convenient appointment time. °10. OTHER INSTRUCTIONS: __________________________________________________________________________________________________________________________ __________________________________________________________________________________________________________________________ °WHEN TO CALL YOUR DOCTOR: °1. Fever over 101.0 °2. Inability to urinate °3. Continued bleeding from incision. °4. Increased pain, redness, or drainage from the incision. °5. Increasing abdominal pain ° °The clinic staff is available to answer your questions during regular business hours.  Please don’t hesitate to call and ask to speak to one of the nurses for clinical concerns.  If you have a medical emergency, go to the nearest emergency room or call 911.  A surgeon from [facility] is always on call at the hospital. °[address], [address], [place], [place]  [address] ? [address] [address], [place], [place]   [address] °[phone] ? [phone] ? FAX [phone] °Web site:   [email or web address] ° ° °Laparoscopic Appendectomy, Adult, Care After °Refer to  this sheet in the next few weeks. These instructions provide you with information about caring for yourself after your procedure. Your health care provider may also give you more specific instructions. Your treatment has been planned according to current medical practices, but problems sometimes occur. Call your health care provider if you have any problems or questions after your procedure. °What can I expect after the procedure? °After the procedure, it is common to have: °· A decrease in your energy level. °· Mild pain in the area where the surgical cuts (incisions) were made. °· Constipation. This can be caused by pain medicine and a decrease in your activity. °Follow these instructions at home: °Medicines  °· Take over-the-counter and prescription medicines only as told by your health care provider. °· Do not drive for 24 hours if you received a sedative. °· Do not drive or operate heavy machinery while taking prescription pain medicine. °· If you were prescribed an antibiotic medicine, take it as told by your health care provider. Do not stop taking the antibiotic even if you start to feel better. °Activity  °· For 3 weeks or as long as told by your health care provider: °¨ Do not lift anything that is heavier than 10 pounds (4.5 kg). °¨ Do not play contact sports. °· Gradually return to your normal activities. Ask your health care provider what activities are safe for you. °Bathing  °· Keep your incisions clean and dry. Clean them as often as told by your health care provider: °¨ Gently wash the incisions with soap and water. °¨ Rinse the incisions with water to remove all soap. °¨ Pat the incisions dry with a clean towel. Do not rub the incisions. °· You may take showers after 48 hours. °· Do not take baths, swim, or use hot tubs for 2 weeks or as told by your health care provider. °Incision care  °· Follow instructions from your healthcare provider about how to take care of your incisions. Make sure  you: °¨ Wash your hands with soap and water before you change your bandage (dressing). If soap and water are not available, use hand sanitizer. °¨ Change your dressing as told by your health care provider. °¨ Leave stitches (sutures), skin glue, or adhesive strips in place. These skin closures may need to stay in place for 2 weeks or longer. If adhesive strip edges start to loosen and curl up, you may trim the loose edges. Do not remove adhesive strips completely unless your health care provider tells you to do that. °· Check your incision areas every day for signs of infection. Check for: °¨ More redness, swelling, or pain. °¨ More fluid or blood. °¨ Warmth. °¨ Pus or a bad smell. °Other Instructions  °· If you were sent home with a drain, follow instructions from your health care provider about how to care for the drain and how to empty it. °· Take deep breaths. This helps to prevent your lungs from becoming inflamed. °· To relieve and prevent constipation: °¨ Drink plenty of fluids. °¨ Eat plenty of fruits and vegetables. °· Keep all follow-up visits as told by your health care provider. This is important. °Contact a health care provider if: °· You have more redness, swelling, or pain around an incision. °· You have more fluid or blood coming from an incision. °· Your incision feels warm to the touch. °· You have pus or a bad smell coming   from an incision or dressing. °· Your incision edges break open after your sutures have been removed. °· You have increasing pain in your shoulders. °· You feel dizzy or you faint. °· You develop shortness of breath. °· You keep feeling nauseous or vomiting. °· You have diarrhea or you cannot control your bowel functions. °· You lose your appetite. °· You develop swelling or pain in your legs. °Get help right away if: °· You have a fever. °· You develop a rash. °· You have difficulty breathing. °· You have sharp pains in your chest. °This information is not intended to replace  advice given to you by your health care provider. Make sure you discuss any questions you have with your health care provider. °Document Released: 10/13/2005 Document Revised: 03/14/2016 Document Reviewed: 04/02/2015 °Elsevier Interactive Patient Education © 2017 [employer] ° °

## 2017-01-07 NOTE — Op Note (Signed)
01/07/2017  12:06 AM  PATIENT:  Jonathan Miles  56 y.o. male  PRE-OPERATIVE DIAGNOSIS:  appendicitis  POST-OPERATIVE DIAGNOSIS:  Acute appendicitis  PROCEDURE:  Procedure(s): APPENDECTOMY LAPAROSCOPIC (N/A)  SURGEON:  Surgeon(s) and Role:    * Ralene Ok, MD - Primary  ANESTHESIA:   local and general  EBL:  Total I/O In: 1000 [IV Piggyback:1000] Out: 500 [Urine:475; Blood:25]  BLOOD ADMINISTERED:none  DRAINS: none   LOCAL MEDICATIONS USED:  BUPIVICAINE   SPECIMEN:  Source of Specimen:  appendix  DISPOSITION OF SPECIMEN:  PATHOLOGY  COUNTS:  YES  TOURNIQUET:  * No tourniquets in log *  DICTATION: .Dragon Dictation Complications: none  Counts: reported as correct x 2  Findings:  The patient had an acutely inflamed non-perforated appendix  Specimen: Appendix  Indications for procedure:  The patient is a 56 year old male with a history of periumbilical pain localized in the right lower quadrant patient had a CT scan which revealed signs consistent with acute appendicitis the patient back in for laparoscopic appendectomy.  Details of the procedure:The patient was taken back to the operating room. The patient was placed in supine position with bilateral SCDs in place.  A foley catheter was place. The patient was prepped and draped in the usual sterile fashion.  After appropriate anitbiotics were confirmed, a time-out was confirmed and all facts were verified.    A pneumoperitoneum of 14 mmHg was obtained via a Veress needle technique in the left lower quadrant quadrant.  A 5 mm trocar and 5 mm camera then placed intra-abdominally there is no injury to any intra-abdominal organs a 10 mm infraumbilical port was placed and direct visualization as was a 5 mm port in the suprapubic area.   The appendix was identified and seen to be non-perforated.  The appendix was cleaned down to the appendiceal base. The mesoappendix was then incised and the appendiceal artery was  cauterized.  The the appendiceal base was clean.  At this time an Endoloop was placed proximallyx2 and one distally and the appendix was transected between these 2. A retrieval bag was then placed into the abdomen and the specimen placed in the bag. The appendiceal stump was cauterized. We evacuate the fluid from the pelvis until the effluent was clear.  The appendix and retrieval  bag was then retrieved via the supraumbilical port. #1 Vicryl was used to reapproximate the fascia at the umbilical port site x1. The skin was reapproximated all port sites 3-0 Monocryl subcuticular fashion. The skin was dressed with steri-strips, guaze, and tape.  The patient had the foley removed. The patient was awakened from general anesthesia was taken to recovery room in stable condition.      PLAN OF CARE: Admit for overnight observation  PATIENT DISPOSITION:  PACU - hemodynamically stable.   Delay start of Pharmacological VTE agent (>24hrs) due to surgical blood loss or risk of bleeding: not applicable

## 2017-01-07 NOTE — Anesthesia Postprocedure Evaluation (Signed)
Anesthesia Post Note  Patient: Jonathan Miles  Procedure(s) Performed: Procedure(s) (LRB): APPENDECTOMY LAPAROSCOPIC (N/A)  Patient location during evaluation: PACU Anesthesia Type: General Level of consciousness: awake and alert Pain management: pain level controlled Vital Signs Assessment: post-procedure vital signs reviewed and stable Respiratory status: spontaneous breathing, nonlabored ventilation, respiratory function stable and patient connected to nasal cannula oxygen Cardiovascular status: blood pressure returned to baseline and stable Postop Assessment: no signs of nausea or vomiting Anesthetic complications: no       Last Vitals:  Vitals:   01/07/17 0056 01/07/17 0152  BP:  131/73  Pulse:  67  Resp:  15  Temp: 36.5 C 37 C    Last Pain:  Vitals:   01/07/17 0210  TempSrc:   PainSc: Tyler Deis

## 2017-01-07 NOTE — Progress Notes (Signed)
[facility] Progress Note  1 Day Post-Op  Subjective: Pt was up to the bathroom, he tolerated his breakfast, and his pain is controlled. He has not had flatus. No nausea or vomiting. Wife at bedside. She asked about his metformin. All questions answered.  Objective: Vital signs in last 24 hours: Temp:  [97.3 F (36.3 C)-98.7 F (37.1 C)] 98.7 F (37.1 C) (03/14 0609) Pulse Rate:  [64-89] 89 (03/14 0609) Resp:  [12-23] 15 (03/14 0609) BP: (131-170)/(72-95) 161/94 (03/14 0609) SpO2:  [93 %-99 %] 99 % (03/14 0609) Weight:  [320 lb 2 oz (145.2 kg)] 320 lb 2 oz (145.2 kg) (03/13 1706)    Intake/Output from previous day: 03/13 0701 - 03/14 0700 In: 1900 [I.V.:900; IV Piggyback:1000] Out: 500 [Urine:475; Blood:25] Intake/Output this shift: No intake/output data recorded.  PE: Gen:  Alert, NAD, pleasant, cooperative, well appearing, sleepy Card:  RRR, no M/G/R heard Pulm:  CTA, no W/R/R, effort normal Abd: Soft, obese, mild distention, +BS, incisions C/D/I, mild appropriate generalized TTP Skin: no rashes noted, warm and dry  Lab Results:   Recent Labs  01/06/17 1710  WBC 12.4*  HGB 14.4  HCT 42.5  PLT 299   BMET  Recent Labs  01/06/17 1710  NA 141  K 3.6  CL 104  CO2 24  GLUCOSE 104*  BUN 11  CREATININE 0.92  CALCIUM 9.4   PT/INR No results for input(s): LABPROT, INR in the last 72 hours. CMP     Component Value Date/Time   NA 141 01/06/2017 1710   NA 141 10/31/2016 1743   K 3.6 01/06/2017 1710   CL 104 01/06/2017 1710   CO2 24 01/06/2017 1710   GLUCOSE 104 (H) 01/06/2017 1710   BUN 11 01/06/2017 1710   BUN 16 10/31/2016 1743   CREATININE 0.92 01/06/2017 1710   CREATININE 0.89 02/16/2016 1004   CALCIUM 9.4 01/06/2017 1710   PROT 7.3 01/06/2017 1710   PROT 7.0 10/31/2016 1743   ALBUMIN 4.2 01/06/2017 1710   ALBUMIN 4.1 10/31/2016 1743   AST 12 (L) 01/06/2017 1710   ALT 21 01/06/2017 1710   ALKPHOS 64 01/06/2017 1710   BILITOT 0.7  01/06/2017 1710   BILITOT 0.3 10/31/2016 1743   GFRNONAA >60 01/06/2017 1710   GFRNONAA >89 06/09/2013 1416   GFRAA >60 01/06/2017 1710   GFRAA >89 06/09/2013 1416   Lipase     Component Value Date/Time   LIPASE 16 01/06/2017 1710       Studies/Results: Ct Abdomen Pelvis Wo Contrast  Result Date: 01/06/2017 CLINICAL DATA:  Right lower quadrant abdominal pain for 1 day. Nausea and vomiting. History of contrast allergy. EXAM: CT ABDOMEN AND PELVIS WITHOUT CONTRAST TECHNIQUE: Multidetector CT imaging of the abdomen and pelvis was performed following the standard protocol without IV contrast. COMPARISON:  CT chest 10/15/2015 FINDINGS: Lower chest: Postoperative changes in the mediastinum. Coronary artery calcifications. Calcified left hilar lymph nodes. Calcified granuloma in the left base. Hepatobiliary: Cholelithiasis without inflammatory changes in the gallbladder. No bile duct dilatation. No focal liver lesion demonstrated on noncontrast imaging. Scattered hepatic calcified granulomas. Pancreas: Unremarkable. No pancreatic ductal dilatation or surrounding inflammatory changes. Spleen: Calcified granulomas in the spleen.  No splenomegaly. Adrenals/Urinary Tract: Adrenal glands are unremarkable. Kidneys are normal, without renal calculi, focal lesion, or hydronephrosis. Bladder is unremarkable. Stomach/Bowel: The appendix is mildly distended at 9 mm diameter. Inflammatory stranding and edema in the fat around the appendix. Changes are consistent with early acute appendicitis. No abscess.  Prominent lymph nodes in the right lower quadrant are likely reactive. Stomach and small bowel are decompressed, limiting evaluation. No evidence of obstruction. Colon is mostly decompressed with scattered gas and stool. Vascular/Lymphatic: Aortic atherosclerosis. No enlarged abdominal or pelvic lymph nodes. Reproductive: Prostate gland is enlarged, measuring 5.6 cm diameter. Other: No abdominal wall hernia or  abnormality. No abdominopelvic ascites. Musculoskeletal: Degenerative changes in the spine. No destructive bone lesions. IMPRESSION: Changes of acute appendicitis with mild appendiceal distention and stranding in the right lower quadrant fat. No abscess. Cholelithiasis. Electronically Signed   By: Lucienne Capers M.D.   On: 01/06/2017 21:37    Anti-infectives: Anti-infectives    Start     Dose/Rate Route Frequency Ordered Stop   01/06/17 2145  cefTRIAXone (ROCEPHIN) 1 g in dextrose 5 % 50 mL IVPB     1 g 100 mL/hr over 30 Minutes Intravenous  Once 01/06/17 2143 01/06/17 2247   01/06/17 2145  metroNIDAZOLE (FLAGYL) IVPB 500 mg     500 mg 100 mL/hr over 60 Minutes Intravenous  Once 01/06/17 2143 01/06/17 2307       Assessment/Plan HLD OSA HTN CAD CHF DM Aortic insuffieciency  Acute Appendicitis - S/P laparoscopic appendectomy, Dr. Rosendo Gros, 01/06/17  FEN: reg diet VTE: SCD's ID: Flagyl and Cipro once on 3/13  Plan: Pt is tolerating diet, ambulated, urinated, and pain controlled. He will likely be discharged this afternoon.   LOS: 0 days    Kalman Drape , [facility] 01/07/2017, 9:11 AM Pager: [phone] Consults: [phone] Mon-Fri 7:00 am-4:30 pm Sat-Sun 7:00 am-11:30 am

## 2017-01-08 ENCOUNTER — Encounter: Payer: Self-pay | Admitting: Physician Assistant

## 2017-01-08 ENCOUNTER — Encounter: Payer: Self-pay | Admitting: Cardiothoracic Surgery

## 2017-01-08 ENCOUNTER — Encounter (INDEPENDENT_AMBULATORY_CARE_PROVIDER_SITE_OTHER): Payer: Self-pay | Admitting: Family Medicine

## 2017-01-21 ENCOUNTER — Encounter: Payer: Self-pay | Admitting: Physician Assistant

## 2017-01-21 DIAGNOSIS — N3281 Overactive bladder: Secondary | ICD-10-CM | POA: Insufficient documentation

## 2017-01-21 DIAGNOSIS — N138 Other obstructive and reflux uropathy: Secondary | ICD-10-CM | POA: Insufficient documentation

## 2017-01-21 DIAGNOSIS — N401 Enlarged prostate with lower urinary tract symptoms: Secondary | ICD-10-CM | POA: Insufficient documentation

## 2017-01-26 ENCOUNTER — Ambulatory Visit (INDEPENDENT_AMBULATORY_CARE_PROVIDER_SITE_OTHER): Payer: BLUE CROSS/BLUE SHIELD | Admitting: Pulmonary Disease

## 2017-01-26 ENCOUNTER — Encounter: Payer: Self-pay | Admitting: Pulmonary Disease

## 2017-01-26 VITALS — BP 132/78 | HR 70 | Ht 75.0 in | Wt 311.0 lb

## 2017-01-26 DIAGNOSIS — G4733 Obstructive sleep apnea (adult) (pediatric): Secondary | ICD-10-CM | POA: Diagnosis not present

## 2017-01-26 DIAGNOSIS — J45909 Unspecified asthma, uncomplicated: Secondary | ICD-10-CM | POA: Diagnosis not present

## 2017-01-26 DIAGNOSIS — E6609 Other obesity due to excess calories: Secondary | ICD-10-CM | POA: Diagnosis not present

## 2017-01-26 NOTE — Assessment & Plan Note (Signed)
Patient was diagnosed with OSA in 2006 based on a lab study.  AHI was 16.  He was placed on cpap 10-13 cm water. Overall, he did not tolerate CPAP. It was a combination of being in comfortable with a full face mask and the pressure being too high.  He moves a lot in bed and oftentimes, the mask comes off.  He also felt the pressure was too much although  he was only on cpap 10 cm water.   He has a lot of medical issues going on. 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 He also has diabetes but that is stable. He has also gained weight through the years. He recently saw a weight doctor and that has done well for him. He has lost 30 pounds at least the last couple months.  He remained symptomatic as far as his sleep apnea is concerned. Has hypersomnia, snoring, gasping, witnessed apneas, choking, unrefreshed sleep. Hypersomnia affects his functionality. 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 He does short distance driving and sometimes he can get really tired in the afternoon.  No current abnormal behavior and sleep. He used to sleep talk years ago and has not done that recently.  Plan : We discussed about the diagnosis of Obstructive Sleep Apnea (OSA) and implications of untreated OSA. We discussed about CPAP and BiPaP as possible treatment options.    We will schedule the patient for a sleep study. Plan for a split night study.  Did not tolerate FFM before.  Will try nasal mask or pillows or dreamwear or even amara mask.  Will need a mask fitting session.  Also, he was on CPAP 10-13 centimeters water and felt the pressure was too much. May need to start him on  A lower cpap pressure. He will need good compliance because of his comorbidities with heart disease, diabetes.   Patient was instructed to call the office if he/she has not heard back from the office 1-2 weeks after the sleep study.   Patient was instructed to  call the office if he/she is having issues with the PAP device.   We discussed good sleep hygiene.   Patient was advised not to engage in activities requiring concentration and/or vigilance if he/she is sleepy.  Patient was advised not to drive if he/she is sleepy.

## 2017-01-26 NOTE — Progress Notes (Signed)
Subjective:    Patient ID: Jonathan Miles, male    DOB: [birth year], 56 y.o.   MRN: [id number]  HPI   This is the case of Jonathan Miles, 56 y.o. Male, who was referred by Dr. Dennard Nip  in consultation regarding OSA. Patient has a  57 PY smoking history, quit in his 68s. Patient was diagnosed with childhood asthma.  Asthma has been stable. Last flare was in his 50s.  Triggers: pollen, change in weather, exertion.   As you very well know, patient was diagnosed with OSA in 2006 based on a lab study.  AHI was 16.  He was placed on cpap 10 cm water. Overall, he did not tolerate CPAP. It was a combination of being in comfortable with a full face mask and the pressure being too high.  He moves a lot in bed and oftentimes, the mask comes off.  He also felt the pressure was too much although  he was only on cpap 10 cm water.   He has a lot of medical issues going on. 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 He also has diabetes but that is stable. He has also gained weight through the years. He recently saw a weight doctor and that has done well for him. He has lost 30 pounds at least the last couple months.  He works as Theatre manager for [employer].   He remained symptomatic as far as his sleep apnea is concerned. Has hypersomnia, snoring, gasping, witnessed apneas, choking, unrefreshed sleep. Hypersomnia affects his functionality. 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 He does short distance driving and sometimes he can get really tired in the afternoon.  No current abnormal behavior and sleep. He used to sleep talk years ago and has not done that recently.   Past Medical History:  Diagnosis Date  . Alcohol abuse   . Aortic root dilatation (HCC)    a. echo 4/12: EF 55-60%, mod to marked dilated ascending aortic root;   b. MRA 4/12: Aortic root 5.5 cm, dilation of left and right coronary cusps,  trileaflet aortic valve  . Arthritis   . Asthma    ast attack in early 20's  . CAD (coronary artery disease)   . Chest pain    Myoview 4/12: EF 62%, no ischemia or scar  . CHF (congestive heart failure) ([facility])   . Chronic pain    back and hands  . Constipation   . COPD (chronic obstructive pulmonary disease) ([facility])   . Depression   . Diabetes mellitus    type 2  . Drug use   . Enlarged prostate   . Fatigue   . GERD (gastroesophageal reflux disease)    modifies with diet  . Hx of echocardiogram    Echo (2/16):  Mod LVH, EF 55-60%, mild to mod AI, mod LAE, mild RAE, Aortic root 38 mm, Asc aorta 43 mm  . Hyperlipidemia   . Hypertension   . Joint pain   . Knee pain, bilateral   . OSA (obstructive sleep apnea) 2006   .  Wears at times  . Osteoarthritis   . Palpitations   . PONV (postoperative nausea and vomiting)   . SOB (shortness of breath)    (-) CA, DVT  Family History  Problem Relation Age of Onset  . Adopted: Yes  . Family history unknown: Yes     Past  Surgical History:  Procedure Laterality Date  . Colonscopy    . CORONARY ARTERY BYPASS GRAFT  05/07/2012   Procedure: CORONARY ARTERY BYPASS GRAFTING (CABG);  Surgeon: Grace Isaac, MD;  Location: [facility];  Service: Open Heart Surgery;  Laterality: N/A;  . KNEE ARTHROSCOPY  2010   left  . LAPAROSCOPIC APPENDECTOMY N/A 01/06/2017   Procedure: APPENDECTOMY LAPAROSCOPIC;  Surgeon: Ralene Ok, MD;  Location: [facility];  Service: General;  Laterality: N/A;  . Retactment of fingers     as a child , sewed with sewing machine- Index and middle finger  . UPPER GASTROINTESTINAL ENDOSCOPY      Social History   Social History  . Marital status: Married    Spouse name: Jermario Kalmar  . Number of children: 0  . Years of education: 12th grade   Occupational History  . 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   Social History Main Topics  . Smoking status: Former Smoker    Packs/day:  1.00    Years: 13.00    Types: Cigarettes    Quit date: 01/23/1988  . Smokeless tobacco: Never Used  . Alcohol use Yes     Comment: quit 1989  . Drug use: Yes    Types: Cocaine, Marijuana     Comment: previous cocaine and marijuana use, quit 1988  . Sexual activity: Not Currently    Partners: Female   Other Topics Concern  . Not on file   Social History Narrative   Married, lives in [place]. Therapist, occupational for [employer].    Pt is adopted. Unsure of his family medical history.     Allergies  Allergen Reactions  . Ibuprofen Other (See Comments)    TOLD NOT TO TAKE-PER CARDS  . Iodinated Diagnostic Agents Hives    Hives started 4 days after cta chest was performed, minimal relief w/ benadryl x 3 days, uncertain if reaction was actually iv contrast or other allergen,allergy tests to follow.wife will make [place] aware of results//a.[email or web address]     Outpatient Medications Prior to Visit  Medication Sig Dispense Refill  . acetaminophen (TYLENOL) 500 MG tablet TAKE 1-2 TABS EVERY 6 HOURS AS NEEDED FOR PAIN (Patient taking differently: Take 500-1,000 mg by mouth every 6 (six) hours as needed. TAKE 1-2 TABS EVERY 6 HOURS AS NEEDED FOR PAIN)    . aspirin 81 MG tablet Take 81 mg by mouth daily.    Marland Kitchen atorvastatin (LIPITOR) 40 MG tablet Take 1 tablet (40 mg total) by mouth daily. 90 tablet 3  . carvedilol (COREG) 12.5 MG tablet Take 1 tablet (12.5 mg total) by mouth 2 (two) times daily. 180 tablet 3  . diclofenac sodium (VOLTAREN) 1 % GEL Apply 2 g topically 4 (four) times daily. (Patient taking differently: Apply 2 g topically 4 (four) times daily as needed (PAIN). ) 300 g 1  . felodipine (PLENDIL) 10 MG 24 hr tablet Take 1 tablet (10 mg total) by mouth daily. 90 tablet 3  . furosemide (LASIX) 40 MG tablet Take 1 tablet (40 mg total) by mouth daily. 90 tablet 3  . lisinopril (PRINIVIL,ZESTRIL) 20 MG tablet Take 1 tablet (20 mg total) by mouth daily. 90 tablet 3  . loratadine (CLARITIN)  10 MG tablet Take 10 mg by mouth daily.    . metFORMIN (GLUCOPHAGE) 1000 MG tablet 1 bid (Patient taking differently: 1,000 mg 2 (two) times daily with a meal. 1 bid) 180 tablet 3  . mirabegron ER (MYRBETRIQ)  50 MG TB24 tablet Take 50 mg by mouth daily.    Marland Kitchen oxyCODONE (OXY IR/ROXICODONE) 5 MG immediate release tablet Take 1-2 tablets (5-10 mg total) by mouth every 4 (four) hours as needed for moderate pain. 15 tablet 0  . tamsulosin (FLOMAX) 0.4 MG CAPS capsule Take 2 capsules (0.8 mg total) by mouth daily after supper. 180 capsule 3  . traMADol (ULTRAM) 50 MG tablet TAKE TWO TABLETS BY MOUTH EVERY 6 HOURS AS NEEDED (Patient taking differently: TAKE TWO TABLETS BY MOUTH AT NIGHT AS NEEDED FOR PAIN) 90 tablet 0  . Vitamin D, Ergocalciferol, (DRISDOL) 50000 units CAPS capsule Take 1 capsule (50,000 Units total) by mouth every 7 (seven) days. (Patient taking differently: Take 50,000 Units by mouth every Saturday. ) 4 capsule 0   No facility-administered medications prior to visit.    No orders of the defined types were placed in this encounter.     Review of Systems  Constitutional: Negative.  Negative for fever and unexpected weight change.  HENT: Positive for congestion. Negative for dental problem, ear pain, nosebleeds, postnasal drip, rhinorrhea, sinus pressure, sneezing, sore throat and trouble swallowing.   Eyes: Negative.  Negative for redness and itching.  Respiratory: Positive for shortness of breath. Negative for cough, chest tightness and wheezing.   Cardiovascular: Negative.  Negative for palpitations and leg swelling.  Gastrointestinal: Negative.  Negative for nausea and vomiting.  Endocrine: Negative.   Genitourinary: Negative.  Negative for dysuria.  Musculoskeletal: Positive for joint swelling.  Skin: Negative.  Negative for rash.  Allergic/Immunologic: Negative.  Negative for environmental allergies, food allergies and immunocompromised state.  Neurological: Positive for  headaches.  Hematological: Bruises/bleeds easily.  Psychiatric/Behavioral: Negative.  Negative for dysphoric mood. The patient is not nervous/anxious.        Objective:   Physical Exam   Vitals:  Vitals:   01/26/17 1147  BP: 132/78  Pulse: 70  SpO2: 95%  Weight: (!) 311 lb (141.1 kg)  Height: 6\' 3"  (1.905 m)    Constitutional/General:  Pleasant, well-nourished, well-developed, not in any distress,  Comfortably seating.  Well kempt  Body mass index is 38.87 kg/m. Wt Readings from Last 3 Encounters:  01/26/17 (!) 311 lb (141.1 kg)  01/06/17 (!) 320 lb 2 oz (145.2 kg)  12/17/16 (!) 325 lb (147.4 kg)    Neck circumference:   HEENT: Pupils equal and reactive to light and accommodation. Anicteric sclerae. Normal nasal mucosa.   No oral  lesions,  mouth clear,  oropharynx clear, no postnasal drip. (-) Oral thrush. No dental caries.  Airway - Mallampati class III - IV  Neck: No masses. Midline trachea. No JVD, (-) LAD. (-) bruits appreciated.  Respiratory/Chest: Grossly normal chest. (-) deformity. (-) Accessory muscle use.  Symmetric expansion. (-) Tenderness on palpation.  Resonant on percussion.  Diminished BS on both lower lung zones. (-) wheezing, crackles, rhonchi (-) egophony  Cardiovascular: Regular rate and  rhythm, heart sounds normal, no murmur or gallops, no peripheral edema  Gastrointestinal:  Normal bowel sounds. Soft, non-tender. No hepatosplenomegaly.  (-) masses.   Musculoskeletal:  Normal muscle tone. Normal gait.   Extremities: Grossly normal. (-) clubbing, cyanosis.  (-) edema  Skin: (-) rash,lesions seen.   Neurological/Psychiatric : alert, oriented to time, place, person. Normal mood and affect           Assessment & Plan:  OSA (obstructive sleep apnea) Patient was diagnosed with OSA in 2006 based on a lab study.  AHI was 16.  He was placed on cpap 10-13 cm water. Overall, he did not tolerate CPAP. It was a combination of being in  comfortable with a full face mask and the pressure being too high.  He moves a lot in bed and oftentimes, the mask comes off.  He also felt the pressure was too much although  he was only on cpap 10 cm water.   He has a lot of medical issues going on. 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 He also has diabetes but that is stable. He has also gained weight through the years. He recently saw a weight doctor and that has done well for him. He has lost 30 pounds at least the last couple months.  He remained symptomatic as far as his sleep apnea is concerned. Has hypersomnia, snoring, gasping, witnessed apneas, choking, unrefreshed sleep. Hypersomnia affects his functionality. 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 He does short distance driving and sometimes he can get really tired in the afternoon.  No current abnormal behavior and sleep. He used to sleep talk years ago and has not done that recently.  Plan : We discussed about the diagnosis of Obstructive Sleep Apnea (OSA) and implications of untreated OSA. We discussed about CPAP and BiPaP as possible treatment options.    We will schedule the patient for a sleep study. Plan for a split night study.  Did not tolerate FFM before.  Will try nasal mask or pillows or dreamwear or even amara mask.  Will need a mask fitting session.  Also, he was on CPAP 10-13 centimeters water and felt the pressure was too much. May need to start him on  A lower cpap pressure. He will need good compliance because of his comorbidities with heart disease, diabetes.   Patient was instructed to call the office if he/she has not heard back from the office 1-2 weeks after the sleep study.   Patient was instructed to call the office if he/she is having issues with the PAP device.   We discussed good sleep hygiene.   Patient was advised not to engage in activities requiring concentration  and/or vigilance if he/she is sleepy.  Patient was advised not to drive if he/she is sleepy.     Asthma Pt was diagnosed with  childhood asthma. Has been stable since his 49s. Triggers include pollen, change in weather, exercise. Observe for now.   Obesity Weight reduction. Slowly losing weight now.      Thank you very much for letting me participate in this patient's care. Please do not hesitate to give me a call if you have any questions or concerns regarding the treatment plan.   Patient will follow up with me in 8-10 weeks    J. Shirl Harris, MD 01/26/2017   12:46 PM [facility] [place] Pager: [phone] Office: [phone], Fax: [phone]

## 2017-01-26 NOTE — Patient Instructions (Signed)
It was a pleasure taking care of you today!  We will schedule you to have a sleep study to determine if you have sleep apnea.     We will get a lab sleep study.  You will be scheduled to have a lab sleep study in 4-6 weeks.  Someone from the sleep lab will call you in 2-3 days to schedule the study with you.  They usually have cancellations every night so most likely, they will have openings for a lab sleep study next week or so.  We encourage you to do your sleep study then if possible. Please give us a call in a week is no one from the sleep lab calls you in 2-3 days.   If the sleep study is positive, we will order you a CPAP  machine.  Please call the office if you do NOT receive your machine in the next 1-2 weeks.   Please make sure you use your CPAP device everytime you sleep.  We will monitor the usage of your machine per your insurance requirement.  Your insurance company may take the machine from you if you are not using it regularly.   Please clean the mask, tubings, filter, water reservoir with soapy water every week.  Please use distilled water for the water reservoir.   Please call the office or your machine provider (DME company) if you are having issues with the device.   Return to clinic in 8-10 weeks with Dr. De Dios or NP    

## 2017-01-26 NOTE — Assessment & Plan Note (Signed)
Pt was diagnosed with  childhood asthma. Has been stable since his 76s. Triggers include pollen, change in weather, exercise. Observe for now.

## 2017-01-26 NOTE — Assessment & Plan Note (Signed)
Weight reduction. Slowly losing weight now.

## 2017-01-28 ENCOUNTER — Ambulatory Visit (INDEPENDENT_AMBULATORY_CARE_PROVIDER_SITE_OTHER): Payer: BLUE CROSS/BLUE SHIELD | Admitting: Family Medicine

## 2017-01-28 ENCOUNTER — Encounter (INDEPENDENT_AMBULATORY_CARE_PROVIDER_SITE_OTHER): Payer: Self-pay

## 2017-01-28 VITALS — BP 143/81 | HR 63 | Temp 98.0°F | Ht 75.0 in | Wt 305.0 lb

## 2017-01-28 DIAGNOSIS — E669 Obesity, unspecified: Secondary | ICD-10-CM | POA: Diagnosis not present

## 2017-01-28 DIAGNOSIS — E119 Type 2 diabetes mellitus without complications: Secondary | ICD-10-CM

## 2017-01-28 DIAGNOSIS — Z6838 Body mass index (BMI) 38.0-38.9, adult: Secondary | ICD-10-CM

## 2017-01-29 NOTE — Progress Notes (Signed)
Office: [phone]  /  Fax: [phone]   HPI:   Chief Complaint: OBESITY Jonathan Miles is here to discuss his progress with his obesity treatment plan. He is following his eating plan approximately 70 % of the time and states he is walking 20 minutes 3 times per week. Jonathan Miles continues to do well with weight loss, had emergency appendectomy between last visit and now but is feeling better now and is back to his diet plan and has started back to walking. His weight is (!) 305 lb (138.3 kg) today and has had a weight loss of 6 pounds over a period of 6 weeks since his last visit. He has lost 34 lbs since starting treatment with [place].  Diabetes II Jonathan Miles has a diagnosis of diabetes type II. Jonathan Miles states fasting blood sugars are running between 96 and 125, 2 hour post prandial are running between 98 and 159 and denies any hypoglycemic episodes. Last A1c was at 7.8 Jonathan Miles is on Metformin and doing well with weight loss and diet. He has been working on intensive lifestyle modifications including diet, exercise, and weight loss to help control his blood glucose levels.  Wt Readings from Last 500 Encounters:  01/28/17 (!) 305 lb (138.3 kg)  01/26/17 (!) 311 lb (141.1 kg)  01/06/17 (!) 320 lb 2 oz (145.2 kg)  12/17/16 (!) 325 lb (147.4 kg)  12/03/16 (!) 339 lb (153.8 kg)  11/25/16 (!) 344 lb 8 oz (156.3 kg)  10/31/16 (!) 348 lb (157.9 kg)  10/16/16 (!) 340 lb (154.2 kg)  02/16/16 (!) 333 lb 3.2 oz (151.1 kg)  11/02/15 (!) 338 lb 12.8 oz (153.7 kg)  10/15/15 (!) 324 lb (147 kg)  10/08/15 (!) 324 lb (147 kg)  10/06/15 (!) 322 lb 3.2 oz (146.1 kg)  09/27/15 (!) 331 lb 12.8 oz (150.5 kg)  07/12/15 (!) 337 lb 1.9 oz (152.9 kg)  07/06/15 (!) 337 lb (152.9 kg)  12/04/14 (!) 340 lb (154.2 kg)  10/04/14 (!) 332 lb (150.6 kg)  12/10/13 (!) 339 lb 2 oz (153.8 kg)  07/05/13 (!) 334 lb (151.5 kg)  06/17/13 (!) 320 lb (145.2 kg)  02/09/13 (!) 325 lb (147.4 kg)  12/15/12 (!) 326 lb (147.9 kg)  11/23/12 (!)  329 lb (149.2 kg)  10/07/12 (!) 313 lb (142 kg)  07/27/12 298 lb 8 oz (135.4 kg)  07/01/12 287 lb (130.2 kg)  06/21/12 273 lb 6.4 oz (124 kg)  06/03/12 285 lb (129.3 kg)  06/03/12 286 lb 13.1 oz (130.1 kg)  05/26/12 289 lb (131.1 kg)  05/15/12 (!) 310 lb 10.1 oz (140.9 kg)  05/05/12 (!) 305 lb 6.4 oz (138.5 kg)  03/04/12 (!) 325 lb (147.4 kg)  02/23/12 (!) 320 lb (145.2 kg)  11/28/11 (!) 313 lb 6.4 oz (142.2 kg)  11/25/11 (!) 311 lb (141.1 kg)  11/14/11 (!) 314 lb (142.4 kg)  11/11/11 (!) 314 lb (142.4 kg)  09/25/11 298 lb (135.2 kg)  02/18/11 (!) 304 lb 12.8 oz (138.3 kg)  02/12/11 (!) 300 lb (136.1 kg)  01/23/11 (!) 301 lb 4 oz (136.6 kg)     ALLERGIES: Allergies  Allergen Reactions  . Ibuprofen Other (See Comments)    TOLD NOT TO TAKE-PER CARDS  . Iodinated Diagnostic Agents Hives    Hives started 4 days after cta chest was performed, minimal relief w/ benadryl x 3 days, uncertain if reaction was actually iv contrast or other allergen,allergy tests to follow.wife will make [place] aware of results//a.calhoun  MEDICATIONS: Current Outpatient Prescriptions on File Prior to Visit  Medication Sig Dispense Refill  . acetaminophen (TYLENOL) 500 MG tablet TAKE 1-2 TABS EVERY 6 HOURS AS NEEDED FOR PAIN (Patient taking differently: Take 500-1,000 mg by mouth every 6 (six) hours as needed. TAKE 1-2 TABS EVERY 6 HOURS AS NEEDED FOR PAIN)    . aspirin 81 MG tablet Take 81 mg by mouth daily.    Marland Kitchen atorvastatin (LIPITOR) 40 MG tablet Take 1 tablet (40 mg total) by mouth daily. 90 tablet 3  . carvedilol (COREG) 12.5 MG tablet Take 1 tablet (12.5 mg total) by mouth 2 (two) times daily. 180 tablet 3  . diclofenac sodium (VOLTAREN) 1 % GEL Apply 2 g topically 4 (four) times daily. (Patient taking differently: Apply 2 g topically 4 (four) times daily as needed (PAIN). ) 300 g 1  . felodipine (PLENDIL) 10 MG 24 hr tablet Take 1 tablet (10 mg total) by mouth daily. 90 tablet 3  . furosemide  (LASIX) 40 MG tablet Take 1 tablet (40 mg total) by mouth daily. 90 tablet 3  . lisinopril (PRINIVIL,ZESTRIL) 20 MG tablet Take 1 tablet (20 mg total) by mouth daily. 90 tablet 3  . loratadine (CLARITIN) 10 MG tablet Take 10 mg by mouth daily.    . metFORMIN (GLUCOPHAGE) 1000 MG tablet 1 bid (Patient taking differently: 1,000 mg 2 (two) times daily with a meal. 1 bid) 180 tablet 3  . mirabegron ER (MYRBETRIQ) 50 MG TB24 tablet Take 50 mg by mouth daily.    Marland Kitchen oxyCODONE (OXY IR/ROXICODONE) 5 MG immediate release tablet Take 1-2 tablets (5-10 mg total) by mouth every 4 (four) hours as needed for moderate pain. 15 tablet 0  . tamsulosin (FLOMAX) 0.4 MG CAPS capsule Take 2 capsules (0.8 mg total) by mouth daily after supper. 180 capsule 3  . traMADol (ULTRAM) 50 MG tablet TAKE TWO TABLETS BY MOUTH EVERY 6 HOURS AS NEEDED (Patient taking differently: TAKE TWO TABLETS BY MOUTH AT NIGHT AS NEEDED FOR PAIN) 90 tablet 0  . Vitamin D, Ergocalciferol, (DRISDOL) 50000 units CAPS capsule Take 1 capsule (50,000 Units total) by mouth every 7 (seven) days. (Patient taking differently: Take 50,000 Units by mouth every Saturday. ) 4 capsule 0   No current facility-administered medications on file prior to visit.     PAST MEDICAL HISTORY: Past Medical History:  Diagnosis Date  . Alcohol abuse   . Aortic root dilatation (HCC)    a. echo 4/12: EF 55-60%, mod to marked dilated ascending aortic root;   b. MRA 4/12: Aortic root 5.5 cm, dilation of left and right coronary cusps, trileaflet aortic valve  . Arthritis   . Asthma    ast attack in early 20's  . CAD (coronary artery disease)   . Chest pain    Myoview 4/12: EF 62%, no ischemia or scar  . CHF (congestive heart failure) ([place])   . Chronic pain    back and hands  . Constipation   . COPD (chronic obstructive pulmonary disease) ([place])   . Depression   . Diabetes mellitus    type 2  . Drug use   . Enlarged prostate   . Fatigue   . GERD (gastroesophageal  reflux disease)    modifies with diet  . Hx of echocardiogram    Echo (2/16):  Mod LVH, EF 55-60%, mild to mod AI, mod LAE, mild RAE, Aortic root 38 mm, Asc aorta 43 mm  . Hyperlipidemia   .  Hypertension   . Joint pain   . Knee pain, bilateral   . OSA (obstructive sleep apnea) 2006   .  Wears at times  . Osteoarthritis   . Palpitations   . PONV (postoperative nausea and vomiting)   . SOB (shortness of breath)     PAST SURGICAL HISTORY: Past Surgical History:  Procedure Laterality Date  . Colonscopy    . CORONARY ARTERY BYPASS GRAFT  05/07/2012   Procedure: CORONARY ARTERY BYPASS GRAFTING (CABG);  Surgeon: Grace Isaac, MD;  Location: [facility];  Service: Open Heart Surgery;  Laterality: N/A;  . KNEE ARTHROSCOPY  2010   left  . LAPAROSCOPIC APPENDECTOMY N/A 01/06/2017   Procedure: APPENDECTOMY LAPAROSCOPIC;  Surgeon: Ralene Ok, MD;  Location: [facility];  Service: General;  Laterality: N/A;  . Retactment of fingers     as a child , sewed with sewing machine- Index and middle finger  . UPPER GASTROINTESTINAL ENDOSCOPY      SOCIAL HISTORY: Social History  Substance Use Topics  . Smoking status: Former Smoker    Packs/day: 1.00    Years: 13.00    Types: Cigarettes    Quit date: 01/23/1988  . Smokeless tobacco: Never Used  . Alcohol use Yes     Comment: quit 1989    FAMILY HISTORY: Family History  Problem Relation Age of Onset  . Adopted: Yes  . Family history unknown: Yes    ROS: Review of Systems  Constitutional: Positive for weight loss.  Endo/Heme/Allergies:       Negative hypoglycemia    PHYSICAL EXAM: Blood pressure (!) 143/81, pulse 63, temperature 98 F (36.7 C), height 6\' 3"  (1.905 m), weight (!) 305 lb (138.3 kg), SpO2 95 %. Body mass index is 38.12 kg/m. Physical Exam  Constitutional: He is oriented to person, place, and time. He appears well-developed and well-nourished.  Cardiovascular: Normal rate.   Pulmonary/Chest: Effort normal.    Musculoskeletal: Normal range of motion.  Neurological: He is oriented to person, place, and time.  Skin: Skin is warm and dry.  Psychiatric: He has a normal mood and affect. His behavior is normal.  Vitals reviewed.   RECENT LABS AND TESTS: BMET    Component Value Date/Time   NA 141 01/06/2017 1710   NA 141 10/31/2016 1743   K 3.6 01/06/2017 1710   CL 104 01/06/2017 1710   CO2 24 01/06/2017 1710   GLUCOSE 104 (H) 01/06/2017 1710   BUN 11 01/06/2017 1710   BUN 16 10/31/2016 1743   CREATININE 0.92 01/06/2017 1710   CREATININE 0.89 02/16/2016 1004   CALCIUM 9.4 01/06/2017 1710   GFRNONAA >60 01/06/2017 1710   GFRNONAA >89 06/09/2013 1416   GFRAA >60 01/06/2017 1710   GFRAA >89 06/09/2013 1416   Lab Results  Component Value Date   HGBA1C 7.8 (H) 10/31/2016   HGBA1C 6.4 02/16/2016   HGBA1C 6.4 07/06/2015   HGBA1C 6.9 10/04/2014   HGBA1C 10.0 12/10/2013   Lab Results  Component Value Date   INSULIN 29.5 (H) 12/03/2016   CBC    Component Value Date/Time   WBC 12.4 (H) 01/06/2017 1710   RBC 5.01 01/06/2017 1710   HGB 14.4 01/06/2017 1710   HCT 42.5 01/06/2017 1710   HCT 40.3 10/31/2016 1743   PLT 299 01/06/2017 1710   PLT 307 10/31/2016 1743   MCV 84.8 01/06/2017 1710   MCV 82 10/31/2016 1743   MCH 28.7 01/06/2017 1710   MCHC 33.9 01/06/2017 1710  RDW 14.2 01/06/2017 1710   RDW 14.7 10/31/2016 1743   LYMPHSABS 1.6 10/31/2016 1743   MONOABS 0.8 12/04/2014 1702   EOSABS 0.3 10/31/2016 1743   BASOSABS 0.1 10/31/2016 1743   Iron/TIBC/Ferritin/ %Sat No results found for: IRON, TIBC, FERRITIN, IRONPCTSAT Lipid Panel     Component Value Date/Time   CHOL 172 11/25/2016 1531   TRIG 129 11/25/2016 1531   HDL 36 (L) 11/25/2016 1531   CHOLHDL 4.8 11/25/2016 1531   VLDL 26 11/25/2016 1531   LDLCALC 110 (H) 11/25/2016 1531   Hepatic Function Panel     Component Value Date/Time   PROT 7.3 01/06/2017 1710   PROT 7.0 10/31/2016 1743   ALBUMIN 4.2 01/06/2017  1710   ALBUMIN 4.1 10/31/2016 1743   AST 12 (L) 01/06/2017 1710   ALT 21 01/06/2017 1710   ALKPHOS 64 01/06/2017 1710   BILITOT 0.7 01/06/2017 1710   BILITOT 0.3 10/31/2016 1743   BILIDIR 0.1 06/18/2015 0804      Component Value Date/Time   TSH 1.390 10/31/2016 1743    ASSESSMENT AND PLAN: Type 2 diabetes mellitus without complication, without long-term current use of insulin (HCC)  Class 2 obesity without serious comorbidity with body mass index (BMI) of 38.0 to 38.9 in adult, unspecified obesity type  PLAN:  Diabetes II Jonathan Miles has been given extensive diabetes education by myself today including ideal fasting and post-prandial blood glucose readings, individual ideal Hgb A1c goals and hypoglycemia prevention. We discussed the importance of good blood sugar control to decrease the likelihood of diabetic complications such as nephropathy, neuropathy, limb loss, blindness, coronary artery disease, and death. We discussed the importance of intensive lifestyle modification including diet, exercise and weight loss as the first line treatment for diabetes. We will re-check labs in 4 weeks and  Jonathan Miles agrees to continue Metformin and will follow up at the agreed upon time.  We spent > than 50% of the 15 minute visit on the counseling as documented in the note.  Obesity Jonathan Miles is currently in the action stage of change. As such, his goal is to continue with weight loss efforts He has agreed to follow the Category 3 plan +200 calories Jonathan Miles has been instructed to work up to a goal of 150 minutes of combined cardio and strengthening exercise per week for weight loss and overall health benefits. We discussed the following Behavioral Modification Strategies today: increasing lean protein intake, decreasing simple carbohydrates  and increasing lower sugar fruits  Jonathan Miles has agreed to follow up with our clinic in 2 to 3 weeks. He was informed of the importance of frequent follow up visits to maximize  his success with intensive lifestyle modifications for his multiple health conditions.  I, Doreene Nest, am acting as scribe for Dennard Nip, MD  I have reviewed the above documentation for accuracy and completeness, and I agree with the above. -Dennard Nip, MD

## 2017-02-01 ENCOUNTER — Encounter (INDEPENDENT_AMBULATORY_CARE_PROVIDER_SITE_OTHER): Payer: Self-pay | Admitting: Family Medicine

## 2017-02-03 ENCOUNTER — Encounter (INDEPENDENT_AMBULATORY_CARE_PROVIDER_SITE_OTHER): Payer: Self-pay | Admitting: Family Medicine

## 2017-02-03 ENCOUNTER — Other Ambulatory Visit (INDEPENDENT_AMBULATORY_CARE_PROVIDER_SITE_OTHER): Payer: Self-pay

## 2017-02-03 DIAGNOSIS — E559 Vitamin D deficiency, unspecified: Secondary | ICD-10-CM

## 2017-02-17 ENCOUNTER — Ambulatory Visit (INDEPENDENT_AMBULATORY_CARE_PROVIDER_SITE_OTHER): Payer: BLUE CROSS/BLUE SHIELD | Admitting: Family Medicine

## 2017-02-20 ENCOUNTER — Other Ambulatory Visit: Payer: Self-pay | Admitting: Physician Assistant

## 2017-02-20 DIAGNOSIS — G8929 Other chronic pain: Secondary | ICD-10-CM

## 2017-02-20 DIAGNOSIS — M25562 Pain in left knee: Principal | ICD-10-CM

## 2017-02-20 NOTE — Telephone Encounter (Signed)
Called to [facility] 

## 2017-02-28 ENCOUNTER — Other Ambulatory Visit (INDEPENDENT_AMBULATORY_CARE_PROVIDER_SITE_OTHER): Payer: Self-pay | Admitting: Family Medicine

## 2017-02-28 DIAGNOSIS — E559 Vitamin D deficiency, unspecified: Secondary | ICD-10-CM

## 2017-03-05 ENCOUNTER — Other Ambulatory Visit (INDEPENDENT_AMBULATORY_CARE_PROVIDER_SITE_OTHER): Payer: Self-pay | Admitting: Family Medicine

## 2017-03-05 DIAGNOSIS — E559 Vitamin D deficiency, unspecified: Secondary | ICD-10-CM

## 2017-03-09 ENCOUNTER — Ambulatory Visit (HOSPITAL_BASED_OUTPATIENT_CLINIC_OR_DEPARTMENT_OTHER): Payer: BLUE CROSS/BLUE SHIELD | Attending: Pulmonary Disease | Admitting: Pulmonary Disease

## 2017-03-09 VITALS — Ht 76.0 in | Wt 300.0 lb

## 2017-03-09 DIAGNOSIS — G4733 Obstructive sleep apnea (adult) (pediatric): Secondary | ICD-10-CM | POA: Diagnosis not present

## 2017-03-13 ENCOUNTER — Telehealth: Payer: Self-pay | Admitting: Pulmonary Disease

## 2017-03-13 DIAGNOSIS — G4733 Obstructive sleep apnea (adult) (pediatric): Secondary | ICD-10-CM

## 2017-03-13 NOTE — Telephone Encounter (Signed)
Spoke with pt and informed him of his results per AD. Pt understood and agreed to the additional testing to be done. The order was placed and he had no further questions. Nothing further is needed

## 2017-03-13 NOTE — Telephone Encounter (Signed)
    Please call the pt and tell the pt the LAB SLEEP STUDY  showed severe OSA  Pt stops breathing  74  times an hour.   Please schedule the patient for a CPAP/BiPAP titration study..   Thanks!   J. Shirl Harris, MD 03/13/2017, 2:20 PM

## 2017-03-13 NOTE — Procedures (Signed)
    NAME: Jonathan Miles DATE OF BIRTH:  25-Nov-1960 MEDICAL RECORD NUMBER [id number]  LOCATION: [facility]  PHYSICIAN: [place]  DATE OF STUDY: 03/09/2017   CLINICAL INFORMATION  Sleep Study Type: NPSG  Indication for sleep study: OSA  Epworth Sleepiness Score: 14   SLEEP STUDY TECHNIQUE  As per the AASM Manual for the Scoring of Sleep and Associated Events v2.3 (April 2016) with a hypopnea requiring 4% desaturations.  The channels recorded and monitored were frontal, central and occipital EEG, electrooculogram (EOG), submentalis EMG (chin), nasal and oral airflow, thoracic and abdominal wall motion, anterior tibialis EMG, snore microphone, electrocardiogram, and pulse oximetry.   MEDICATIONS  Medications self-administered by patient taken the night of the study : N/A. Meds reviewed per chart review done.  SLEEP ARCHITECTURE  The study was initiated at 10:45:14 PM and ended at 4:46:49 AM.  Sleep onset time was 37.8 minutes and the sleep efficiency was 76.6%. The total sleep time was 276.8 minutes.  Stage REM latency was 111.0 minutes.  The patient spent 11.67% of the night in stage N1 sleep, 57.26% in stage N2 sleep, 0.00% in stage N3 and 31.07% in REM.  Alpha intrusion was absent.  Supine sleep was 35.51%.   RESPIRATORY PARAMETERS  The overall apnea/hypopnea index (AHI) was 73.7 per hour. There were 21 total apneas, including 20 obstructive, 1 central and 0 mixed apneas. There were 319 hypopneas and 11 RERAs.  The AHI during Stage REM sleep was 99.8 per hour. AHI while supine was 113.5 per hour.  The mean oxygen saturation was 89.90%. The minimum SpO2 during sleep was 77.00%.  Loud snoring was noted during this study.  CARDIAC DATA  The 2 lead EKG demonstrated sinus rhythm. The mean heart rate was 72.86 beats per minute. Other EKG findings include: PVCs.   LEG MOVEMENT DATA  The total PLMS were 91 with a resulting PLMS index of 19.73.  Associated arousal with leg movement index was 0.4 .  IMPRESSIONS  1. Severe obstructive sleep apnea occurred during this study (AHI = 73.7/h). 2. No significant central sleep apnea occurred during this study (CAI = 0.2/h). 3. Moderate oxygen desaturation was noted during this study (Min O2 = 77.00%). 4. The patient snored with Loud snoring volume. 5. EKG findings include PVCs. 6. Mild periodic limb movements of sleep occurred during the study. No significant associated arousals.  DIAGNOSIS  1. Obstructive Sleep Apnea (327.23 [G47.33 ICD-10])  RECOMMENDATIONS  1. Therapeutic CPAP titration to determine optimal pressure required to alleviate sleep disordered breathing. Patient has severe OSA and has cardiac co-morbidities. He was on cpap before and was not tolerant to high pressures. 2. Avoid alcohol, sedatives and other CNS depressants that may worsen sleep apnea and disrupt normal sleep architecture. 3. Sleep hygiene should be reviewed to assess factors that may improve sleep quality. 4. Weight management and regular exercise should be initiated or continued if appropriate. 5. Follow up in the office after obtaining cpap machine after cpap titration study.  Monica Becton, MD 03/13/2017, 2:19 PM [facility] Pager [phone] After 3 pm or if no answer, call [phone]

## 2017-03-14 DIAGNOSIS — G4733 Obstructive sleep apnea (adult) (pediatric): Secondary | ICD-10-CM

## 2017-03-25 ENCOUNTER — Ambulatory Visit: Payer: BLUE CROSS/BLUE SHIELD | Admitting: Pulmonary Disease

## 2017-04-03 ENCOUNTER — Encounter (HOSPITAL_BASED_OUTPATIENT_CLINIC_OR_DEPARTMENT_OTHER): Payer: BLUE CROSS/BLUE SHIELD

## 2017-04-06 ENCOUNTER — Other Ambulatory Visit: Payer: Self-pay | Admitting: Physician Assistant

## 2017-04-06 DIAGNOSIS — G8929 Other chronic pain: Secondary | ICD-10-CM

## 2017-04-06 DIAGNOSIS — M25562 Pain in left knee: Principal | ICD-10-CM

## 2017-04-07 NOTE — Telephone Encounter (Signed)
Rx printed at 104. Will bring to 102 after clinic.  Meds ordered this encounter  Medications  . traMADol (ULTRAM) 50 MG tablet    Sig: TAKE TWO TABLETS BY MOUTH EVERY 6 HOURS AS NEEDED    Dispense:  90 tablet    Refill:  0

## 2017-05-29 ENCOUNTER — Other Ambulatory Visit: Payer: Self-pay | Admitting: Physician Assistant

## 2017-05-29 DIAGNOSIS — M25562 Pain in left knee: Principal | ICD-10-CM

## 2017-05-29 DIAGNOSIS — G8929 Other chronic pain: Secondary | ICD-10-CM

## 2017-05-29 NOTE — Telephone Encounter (Signed)
Done

## 2017-05-31 ENCOUNTER — Ambulatory Visit (HOSPITAL_BASED_OUTPATIENT_CLINIC_OR_DEPARTMENT_OTHER): Payer: BLUE CROSS/BLUE SHIELD | Attending: Pulmonary Disease | Admitting: Pulmonary Disease

## 2017-05-31 DIAGNOSIS — G4733 Obstructive sleep apnea (adult) (pediatric): Secondary | ICD-10-CM | POA: Insufficient documentation

## 2017-06-05 ENCOUNTER — Telehealth: Payer: Self-pay | Admitting: Pulmonary Disease

## 2017-06-05 DIAGNOSIS — G4730 Sleep apnea, unspecified: Secondary | ICD-10-CM | POA: Diagnosis not present

## 2017-06-05 DIAGNOSIS — G4733 Obstructive sleep apnea (adult) (pediatric): Secondary | ICD-10-CM

## 2017-06-05 NOTE — Procedures (Signed)
Patient Name: Jonathan Miles, Leather Date: 05/31/2017 Gender: Male D.O.B: [birth year] Age (years): 55 Referring Provider: Jerome Height (inches): 76 Interpreting Physician: Kara Mead MD, ABSM Weight (lbs): 300 RPSGT: Madelon Lips BMI: 37 MRN: [id number] Neck Size: 16.50   CLINICAL INFORMATION The patient is referred for a CPAP titration to treat sleep apnea.  Date of NPSG: 03/09/17 , severe OSA with AHI 74/h  SLEEP STUDY TECHNIQUE As per the AASM Manual for the Scoring of Sleep and Associated Events v2.3 (April 2016) with a hypopnea requiring 4% desaturations.  The channels recorded and monitored were frontal, central and occipital EEG, electrooculogram (EOG), submentalis EMG (chin), nasal and oral airflow, thoracic and abdominal wall motion, anterior tibialis EMG, snore microphone, electrocardiogram, and pulse oximetry. Continuous positive airway pressure (CPAP) was initiated at the beginning of the study and titrated to treat sleep-disordered breathing.  RESPIRATORY PARAMETERS Optimal PAP Pressure (cm): 11 AHI at Optimal Pressure (/hr): 3.1 Overall Minimal O2 (%): 86.00 Supine % at Optimal Pressure (%): 31 Minimal O2 at Optimal Pressure (%): 90.0     SLEEP ARCHITECTURE The study was initiated at 10:14:22 PM and ended at 5:17:48 AM.  Sleep onset time was 5.8 minutes and the sleep efficiency was 80.7%. The total sleep time was 341.6 minutes.  The patient spent 5.71% of the night in stage N1 sleep, 62.35% in stage N2 sleep, 0.00% in stage N3 and 31.94% in REM.Stage REM latency was 128.0 minutes  Wake after sleep onset was 76.0. Alpha intrusion was absent. Supine sleep was 25.32%.  CARDIAC DATA The 2 lead EKG demonstrated sinus rhythm. The mean heart rate was 65.44 beats per minute. Other EKG findings include: PVCs.   LEG MOVEMENT DATA The total Periodic Limb Movements of Sleep (PLMS) were 453. The PLMS index was 79.56. A PLMS index of <15 is considered normal in  adults.  IMPRESSIONS - The optimal PAP pressure was 11 cm of water. - Central sleep apnea was not noted during this titration (CAI = 0.2/h). - Moderate oxygen desaturations were observed during this titration (min O2 = 86.00%). - The patient snored with Loud snoring volume during this titration study. - 2-lead EKG demonstrated: PVCs - Severe periodic limb movements were observed during this study. Arousals associated with PLMs were rare. Mild PLMs during baseline study   DIAGNOSIS - Obstructive Sleep Apnea (327.23 [G47.33 ICD-10])   RECOMMENDATIONS - Trial of CPAP therapy on 11 cm H2O with a Medium size Fisher&Paykel Full Face Mask Simplus mask and heated humidification. - Avoid alcohol, sedatives and other CNS depressants that may worsen sleep apnea and disrupt normal sleep architecture. - Sleep hygiene should be reviewed to assess factors that may improve sleep quality. - Weight management and regular exercise should be initiated or continued. - Return to Sleep Center for re-evaluation after 4 weeks of therapy - Corelate with clinical history of restless legs syndrome   Kara Mead MD Board Certified in [place]

## 2017-06-05 NOTE — Telephone Encounter (Signed)
Let pt know & send Rx to DME for  CPAP therapy on 11 cm H2O with a Medium size Fisher&Paykel Full Face Mask Simplus mask and heated humidification. DL in 4wks  OV with NP in 6 wks

## 2017-06-08 ENCOUNTER — Emergency Department (HOSPITAL_COMMUNITY)
Admission: EM | Admit: 2017-06-08 | Discharge: 2017-06-08 | Disposition: A | Discharge status: Home / Self Care | Payer: BLUE CROSS/BLUE SHIELD | Attending: Emergency Medicine | Admitting: Emergency Medicine

## 2017-06-08 ENCOUNTER — Encounter (HOSPITAL_COMMUNITY): Payer: Self-pay | Admitting: Emergency Medicine

## 2017-06-08 ENCOUNTER — Other Ambulatory Visit: Payer: Self-pay | Admitting: Physician Assistant

## 2017-06-08 ENCOUNTER — Emergency Department (HOSPITAL_COMMUNITY): Payer: BLUE CROSS/BLUE SHIELD

## 2017-06-08 DIAGNOSIS — R112 Nausea with vomiting, unspecified: Secondary | ICD-10-CM | POA: Diagnosis not present

## 2017-06-08 DIAGNOSIS — Z7984 Long term (current) use of oral hypoglycemic drugs: Secondary | ICD-10-CM | POA: Diagnosis not present

## 2017-06-08 DIAGNOSIS — M25562 Pain in left knee: Principal | ICD-10-CM

## 2017-06-08 DIAGNOSIS — Z87891 Personal history of nicotine dependence: Secondary | ICD-10-CM | POA: Insufficient documentation

## 2017-06-08 DIAGNOSIS — J449 Chronic obstructive pulmonary disease, unspecified: Secondary | ICD-10-CM | POA: Insufficient documentation

## 2017-06-08 DIAGNOSIS — Z7982 Long term (current) use of aspirin: Secondary | ICD-10-CM | POA: Insufficient documentation

## 2017-06-08 DIAGNOSIS — I2510 Atherosclerotic heart disease of native coronary artery without angina pectoris: Secondary | ICD-10-CM | POA: Diagnosis not present

## 2017-06-08 DIAGNOSIS — K8050 Calculus of bile duct without cholangitis or cholecystitis without obstruction: Secondary | ICD-10-CM | POA: Diagnosis not present

## 2017-06-08 DIAGNOSIS — K8020 Calculus of gallbladder without cholecystitis without obstruction: Secondary | ICD-10-CM | POA: Insufficient documentation

## 2017-06-08 DIAGNOSIS — E119 Type 2 diabetes mellitus without complications: Secondary | ICD-10-CM | POA: Insufficient documentation

## 2017-06-08 DIAGNOSIS — Z79899 Other long term (current) drug therapy: Secondary | ICD-10-CM | POA: Insufficient documentation

## 2017-06-08 DIAGNOSIS — R1010 Upper abdominal pain, unspecified: Secondary | ICD-10-CM | POA: Diagnosis present

## 2017-06-08 DIAGNOSIS — G8929 Other chronic pain: Secondary | ICD-10-CM

## 2017-06-08 LAB — URINALYSIS, ROUTINE W REFLEX MICROSCOPIC
Bilirubin Urine: NEGATIVE
Glucose, UA: 50 mg/dL — AB
Hgb urine dipstick: NEGATIVE
Ketones, ur: NEGATIVE mg/dL
Leukocytes, UA: NEGATIVE
Nitrite: NEGATIVE
Protein, ur: NEGATIVE mg/dL
Specific Gravity, Urine: 1.014 (ref 1.005–1.030)
pH: 8 (ref 5.0–8.0)

## 2017-06-08 LAB — CBC
HCT: 40.3 % (ref 39.0–52.0)
Hemoglobin: 14.2 g/dL (ref 13.0–17.0)
MCH: 29 pg (ref 26.0–34.0)
MCHC: 35.2 g/dL (ref 30.0–36.0)
MCV: 82.4 fL (ref 78.0–100.0)
Platelets: 260 10*3/uL (ref 150–400)
RBC: 4.89 MIL/uL (ref 4.22–5.81)
RDW: 14.2 % (ref 11.5–15.5)
WBC: 11.7 10*3/uL — ABNORMAL HIGH (ref 4.0–10.5)

## 2017-06-08 LAB — COMPREHENSIVE METABOLIC PANEL
ALT: 17 U/L (ref 17–63)
AST: 14 U/L — ABNORMAL LOW (ref 15–41)
Albumin: 4.1 g/dL (ref 3.5–5.0)
Alkaline Phosphatase: 70 U/L (ref 38–126)
Anion gap: 10 (ref 5–15)
BUN: 11 mg/dL (ref 6–20)
CO2: 24 mmol/L (ref 22–32)
Calcium: 9.3 mg/dL (ref 8.9–10.3)
Chloride: 105 mmol/L (ref 101–111)
Creatinine, Ser: 0.76 mg/dL (ref 0.61–1.24)
GFR calc Af Amer: >60 mL/min (ref >60)
GFR calc non Af Amer: >60 mL/min (ref >60)
Glucose, Bld: 148 mg/dL — ABNORMAL HIGH (ref 65–99)
Potassium: 3.5 mmol/L (ref 3.5–5.1)
Sodium: 139 mmol/L (ref 135–145)
Total Bilirubin: 0.7 mg/dL (ref 0.3–1.2)
Total Protein: 7.2 g/dL (ref 6.5–8.1)

## 2017-06-08 LAB — LIPASE, BLOOD: Lipase: 27 U/L (ref 11–51)

## 2017-06-08 MED ORDER — HYDROCODONE-ACETAMINOPHEN 5-325 MG PO TABS: 1.0000 | ORAL_TABLET | Freq: Four times a day (QID) | ORAL | 0 refills | Status: DC | PRN

## 2017-06-08 MED ORDER — ONDANSETRON HCL 4 MG/2ML IJ SOLN
4.0000 mg | Freq: Once | INTRAMUSCULAR | Status: AC | PRN
  Administered 2017-06-08: 4 mg via INTRAVENOUS
  Filled 2017-06-08: qty 2

## 2017-06-08 MED ORDER — SODIUM CHLORIDE 0.9 % IV BOLUS (SEPSIS)
1000.0000 mL | Freq: Once | INTRAVENOUS | Status: AC
  Administered 2017-06-08: 1000 mL via INTRAVENOUS

## 2017-06-08 MED ORDER — MORPHINE SULFATE (PF) 2 MG/ML IV SOLN
4.0000 mg | Freq: Once | INTRAVENOUS | Status: AC
  Administered 2017-06-08: 4 mg via INTRAVENOUS
  Filled 2017-06-08: qty 2

## 2017-06-08 MED ORDER — PROMETHAZINE HCL 25 MG PO TABS: 25.0000 mg | ORAL_TABLET | Freq: Four times a day (QID) | ORAL | 0 refills | Status: DC | PRN

## 2017-06-08 NOTE — Discharge Instructions (Signed)
Return here as needed.  Follow-up with the general surgeon for further evaluation

## 2017-06-08 NOTE — ED Provider Notes (Signed)
[facility] DEPT Provider Note   CSN: [id number] Arrival date & time: 06/08/17  2263     History   Chief Complaint Chief Complaint  Patient presents with  . Abdominal Pain  . Emesis    HPI Mar Walmer is a 56 y.o. male.  HPI Patient presents to the emergency department with upper abdominal pain with nausea, vomiting.  Patient states that the symptoms started late last night.  He states that he had a bowel movement was loose stools but no diarrhea.  The patient did not take any medications prior to arrival.  He states that the pain is mainly upper abdominal pain.  Patient states it does not seem to relieve it feel a pressure underneath his rib cage. The patient denies chest pain, shortness of breath, headache,blurred vision, neck pain, fever, cough, weakness, numbness, dizziness, anorexia, edema,  diarrhea, rash, back pain, dysuria, hematemesis, bloody stool, near syncope, or syncope. Past Medical History:  Diagnosis Date  . Alcohol abuse   . Aortic root dilatation (HCC)    a. echo 4/12: EF 55-60%, mod to marked dilated ascending aortic root;   b. MRA 4/12: Aortic root 5.5 cm, dilation of left and right coronary cusps, trileaflet aortic valve  . Arthritis   . Asthma    ast attack in early 20's  . CAD (coronary artery disease)   . Chest pain    Myoview 4/12: EF 62%, no ischemia or scar  . CHF (congestive heart failure) ([place])   . Chronic pain    back and hands  . Constipation   . COPD (chronic obstructive pulmonary disease) ([place])   . Depression   . Diabetes mellitus    type 2  . Drug use   . Enlarged prostate   . Fatigue   . GERD (gastroesophageal reflux disease)    modifies with diet  . Hx of echocardiogram    Echo (2/16):  Mod LVH, EF 55-60%, mild to mod AI, mod LAE, mild RAE, Aortic root 38 mm, Asc aorta 43 mm  . Hyperlipidemia   . Hypertension   . Joint pain   . Knee pain, bilateral   . OSA (obstructive sleep apnea) 2006   .  Wears at times  .  Osteoarthritis   . Palpitations   . PONV (postoperative nausea and vomiting)   . SOB (shortness of breath)     Patient Active Problem List   Diagnosis Date Noted  . Class 2 obesity without serious comorbidity with body mass index (BMI) of 38.0 to 38.9 in adult 01/28/2017  . Asthma 01/26/2017  . Obesity 01/26/2017  . Overactive bladder 01/21/2017  . BPH with obstruction/lower urinary tract symptoms 01/21/2017  . S/P appendectomy 01/07/2017  . Morbid obesity ([place]) 12/17/2016  . Diverticulosis 11/24/2016  . Type 2 diabetes mellitus without complication, without long-term current use of insulin ([place]) 10/31/2016  . Aortic insufficiency 11/23/2012  . Chronic diastolic CHF (congestive heart failure) ([place]) 11/23/2012  . CAD (coronary artery disease) 11/27/2011  . OSA (obstructive sleep apnea) 11/11/2011  . Aortic root dilatation ([place])   . Chest pain 01/23/2011  . Hyperlipidemia 01/23/2011  . Hypertension 01/23/2011    Past Surgical History:  Procedure Laterality Date  . APPENDECTOMY    . Colonscopy    . CORONARY ARTERY BYPASS GRAFT  05/07/2012   Procedure: CORONARY ARTERY BYPASS GRAFTING (CABG);  Surgeon: Grace Isaac, MD;  Location: [facility];  Service: Open Heart Surgery;  Laterality: N/A;  . KNEE ARTHROSCOPY  2010  left  . LAPAROSCOPIC APPENDECTOMY N/A 01/06/2017   Procedure: APPENDECTOMY LAPAROSCOPIC;  Surgeon: Ralene Ok, MD;  Location: [facility];  Service: General;  Laterality: N/A;  . Retactment of fingers     as a child , sewed with sewing machine- Index and middle finger  . UPPER GASTROINTESTINAL ENDOSCOPY         Home Medications    Prior to Admission medications   Medication Sig Start Date End Date Taking? Authorizing Provider  acetaminophen (TYLENOL) 500 MG tablet TAKE 1-2 TABS EVERY 6 HOURS AS NEEDED FOR PAIN Patient taking differently: Take 500-1,000 mg by mouth every 6 (six) hours as needed for mild pain, moderate pain, fever or headache.  05/26/12  Yes  Richardson Dopp T, PA-C  aspirin EC 81 MG tablet Take 81 mg by mouth at bedtime.   Yes [provider]  atorvastatin (LIPITOR) 40 MG tablet Take 1 tablet (40 mg total) by mouth daily. Patient taking differently: Take 40 mg by mouth at bedtime.  10/31/16  Yes Jeffery, Chelle, PA-C  carvedilol (COREG) 12.5 MG tablet Take 1 tablet (12.5 mg total) by mouth 2 (two) times daily. 10/31/16  Yes Jeffery, Chelle, PA-C  diclofenac sodium (VOLTAREN) 1 % GEL Apply 2 g topically 4 (four) times daily. Patient taking differently: Apply 2 g topically 4 (four) times daily as needed (PAIN).  11/19/16  Yes Jeffery, Chelle, PA-C  felodipine (PLENDIL) 10 MG 24 hr tablet Take 1 tablet (10 mg total) by mouth daily. Patient taking differently: Take 10 mg by mouth at bedtime.  10/31/16  Yes Jeffery, Chelle, PA-C  furosemide (LASIX) 40 MG tablet Take 1 tablet (40 mg total) by mouth daily. Patient taking differently: Take 40 mg by mouth daily with breakfast.  10/31/16  Yes Jeffery, Chelle, PA-C  lisinopril (PRINIVIL,ZESTRIL) 20 MG tablet Take 1 tablet (20 mg total) by mouth daily. Patient taking differently: Take 20 mg by mouth daily with breakfast.  10/31/16  Yes Jeffery, Chelle, PA-C  loratadine (CLARITIN) 10 MG tablet Take 10 mg by mouth at bedtime.    Yes [provider]  metFORMIN (GLUCOPHAGE) 1000 MG tablet 1 bid Patient taking differently: Take 1,000 mg by mouth 2 (two) times daily with a meal.  11/01/16  Yes Jeffery, Chelle, PA-C  mirabegron ER (MYRBETRIQ) 50 MG TB24 tablet Take 50 mg by mouth at bedtime.    Yes [provider]  tamsulosin (FLOMAX) 0.4 MG CAPS capsule Take 2 capsules (0.8 mg total) by mouth daily after supper. 10/31/16  Yes Jeffery, Chelle, PA-C  traMADol (ULTRAM) 50 MG tablet TAKE TWO TABLETS BY MOUTH EVERY 6 HOURS AS NEEDED Patient taking differently: TAKE one to TWO TABLETS BY MOUTH EVERY 6 HOURS AS NEEDED for pain 05/29/17  Yes Weber, Sarah L, PA-C  oxyCODONE (OXY IR/ROXICODONE) 5 MG  immediate release tablet Take 1-2 tablets (5-10 mg total) by mouth every 4 (four) hours as needed for moderate pain. Patient not taking: Reported on 06/08/2017 01/07/17   Earnstine Regal, PA-C  Vitamin D, Ergocalciferol, (DRISDOL) 50000 units CAPS capsule Take 1 capsule (50,000 Units total) by mouth every 7 (seven) days. Patient not taking: Reported on 06/08/2017 12/17/16   Starlyn Skeans, MD    Family History Family History  Problem Relation Age of Onset  . Adopted: Yes  . Family history unknown: Yes    Social History Social History  Substance Use Topics  . Smoking status: Former Smoker    Packs/day: 1.00    Years: 13.00  Types: Cigarettes    Quit date: 01/23/1988  . Smokeless tobacco: Never Used  . Alcohol use Yes     Comment: quit 1989     Allergies   Ibuprofen and Iodinated diagnostic agents   Review of Systems Review of Systems All other systems negative except as documented in the HPI. All pertinent positives and negatives as reviewed in the HPI.  Physical Exam Updated Vital Signs BP (!) 148/88   Pulse 66   Temp 98.6 F (37 C) (Oral)   Resp 16   Ht 6\' 4"  (1.93 m)   Wt 132.9 kg (293 lb)   SpO2 100%   BMI 35.67 kg/m   Physical Exam  Constitutional: He is oriented to person, place, and time. He appears well-developed and well-nourished. No distress.  HENT:  Head: Normocephalic and atraumatic.  Mouth/Throat: Oropharynx is clear and moist.  Eyes: Pupils are equal, round, and reactive to light.  Neck: Normal range of motion. Neck supple.  Cardiovascular: Normal rate, regular rhythm and normal heart sounds.  Exam reveals no gallop and no friction rub.   No murmur heard. Pulmonary/Chest: Effort normal and breath sounds normal. No respiratory distress. He has no wheezes.  Abdominal: Soft. Bowel sounds are normal. He exhibits no distension and no mass. There is tenderness. There is guarding. There is no rebound.  Neurological: He is alert and oriented to  person, place, and time. He exhibits normal muscle tone. Coordination normal.  Skin: Skin is warm and dry. Capillary refill takes less than 2 seconds. No rash noted. No erythema.  Psychiatric: He has a normal mood and affect. His behavior is normal.  Nursing note and vitals reviewed.    ED Treatments / Results  Labs (all labs ordered are listed, but only abnormal results are displayed) Labs Reviewed  COMPREHENSIVE METABOLIC PANEL - Abnormal; Notable for the following:       Result Value   Glucose, Bld 148 (*)    AST 14 (*)    All other components within normal limits  CBC - Abnormal; Notable for the following:    WBC 11.7 (*)    All other components within normal limits  URINALYSIS, ROUTINE W REFLEX MICROSCOPIC - Abnormal; Notable for the following:    Glucose, UA 50 (*)    All other components within normal limits  LIPASE, BLOOD    EKG  EKG Interpretation  Date/Time:  Monday June 08 2017 07:16:25 EDT Ventricular Rate:  65 PR Interval:    QRS Duration: 102 QT Interval:  428 QTC Calculation: 445 R Axis:   49 Text Interpretation:  Sinus rhythm Borderline prolonged PR interval Nonspecific T abnrm, anterolateral leads Minimal ST elevation, inferior leads Baseline wander in lead(s) V2 No significant change since last tracing Confirmed by Davonna Belling [phone]) on 06/08/2017 7:30:46 AM Also confirmed by Davonna Belling [phone]), editor Laurena Spies [phone])  on 06/08/2017 8:21:23 AM       Radiology US Abdomen Limited  Result Date: 06/08/2017 CLINICAL DATA:  Right upper quadrant pain. EXAM: ULTRASOUND ABDOMEN LIMITED RIGHT UPPER QUADRANT COMPARISON:  CT abdomen 01/06/2017 FINDINGS: Gallbladder: Tiny cholelithiasis without pericholecystic fluid or gallbladder wall thickening. Negative sonographic Murphy sign. Common bile duct: Diameter: 2 mm Liver: No focal lesion identified. Increased hepatic parenchymal echogenicity. IMPRESSION: 1. Cholelithiasis without sonographic  evidence of acute cholecystitis. Electronically Signed   By: Kathreen Devoid   On: 06/08/2017 09:16    Procedures Procedures (including critical care time)  Medications Ordered in ED Medications  ondansetron (ZOFRAN)  injection 4 mg (4 mg Intravenous Given 06/08/17 0738)  morphine 2 MG/ML injection 4 mg (4 mg Intravenous Given 06/08/17 0750)  sodium chloride 0.9 % bolus 1,000 mL (0 mLs Intravenous Stopped 06/08/17 1212)     Initial Impression / Assessment and Plan / ED Course  I have reviewed the triage vital signs and the nursing notes.  Pertinent labs & imaging results that were available during my care of the patient were reviewed by me and considered in my medical decision making (see chart for details).  Clinical Course as of Jun 08 1214  Mon Jun 08, 2017  0917 US Abdomen Limited [AK]  1047 Alkaline Phosphatase: 70 [AK]    Clinical Course User Index [AK] Alda Ponder, Student-PA    I spoke with the general surgeon about the patient and they evaluated him.  The patient would like to be discharged home and follow up with them in their office.  Patient is feeling better at this time.  He is not vomiting and not had any diarrhea here in the emergency department.  Patient agrees the plan and all questions were answered Final Clinical Impressions(s) / ED Diagnoses   Final diagnoses:  None    New Prescriptions New Prescriptions   No medications on file     Rebeca Allegra 06/08/17 1218    Davonna Belling, MD 06/08/17 [phone]

## 2017-06-08 NOTE — Consult Note (Signed)
[facility] Consult/Admission Note  Jonathan Miles [birth year]  [id number].    Requesting MD: Dalia Heading, PA-C Chief Complaint/Reason for Consult: symptomatic cholelithiasis  HPI:  Pt is a 56 year old male with a history of OSA not on CPAP, CHF, CAD, COPD, Diabetes, aortic root dilatation, s/p appendectomy 12/2016, CABG 04/2012, who presented to the [facility] ED with complaints of abdominal pain, nausea and vomiting. Pt states he has never had this before. He woke around 3AM with mild RUQ/epigastric pain that progressively worsened to severe, sharp, stabbing, radiating to his right and left sides with associated nausea and one episode of nonbilious vomiting. He had a normal BM after the onset of symptoms. The vomiting relieved his pain slightly then it returned. Pt denies CP, SOB, blood in his stools, urinary symptoms, fever, chills. Pt is not on anticoagulation. He sees his cardiologist yearly and his last visit with Dr. Servando Snare was 12/2016. His pain is greatly improved since arrival to the ED. We were asked to see him.   ROS:  Review of Systems  Constitutional: Negative for chills, diaphoresis and fever.  Respiratory: Negative for shortness of breath.   Cardiovascular: Negative for chest pain.  Gastrointestinal: Positive for abdominal pain, nausea and vomiting. Negative for blood in stool, constipation and diarrhea.  Genitourinary: Negative for dysuria and hematuria.  Skin: Negative for rash.  Neurological: Negative for dizziness, loss of consciousness and weakness.  All other systems reviewed and are negative.    Family History  Problem Relation Age of Onset  . Adopted: Yes  . Family history unknown: Yes    Past Medical History:  Diagnosis Date  . Alcohol abuse   . Aortic root dilatation (HCC)    a. echo 4/12: EF 55-60%, mod to marked dilated ascending aortic root;   b. MRA 4/12: Aortic root 5.5 cm, dilation of left and right coronary cusps, trileaflet aortic  valve  . Arthritis   . Asthma    ast attack in early 20's  . CAD (coronary artery disease)   . Chest pain    Myoview 4/12: EF 62%, no ischemia or scar  . CHF (congestive heart failure) ([place])   . Chronic pain    back and hands  . Constipation   . COPD (chronic obstructive pulmonary disease) ([place])   . Depression   . Diabetes mellitus    type 2  . Drug use   . Enlarged prostate   . Fatigue   . GERD (gastroesophageal reflux disease)    modifies with diet  . Hx of echocardiogram    Echo (2/16):  Mod LVH, EF 55-60%, mild to mod AI, mod LAE, mild RAE, Aortic root 38 mm, Asc aorta 43 mm  . Hyperlipidemia   . Hypertension   . Joint pain   . Knee pain, bilateral   . OSA (obstructive sleep apnea) 2006   .  Wears at times  . Osteoarthritis   . Palpitations   . PONV (postoperative nausea and vomiting)   . SOB (shortness of breath)     Past Surgical History:  Procedure Laterality Date  . APPENDECTOMY    . Colonscopy    . CORONARY ARTERY BYPASS GRAFT  05/07/2012   Procedure: CORONARY ARTERY BYPASS GRAFTING (CABG);  Surgeon: Grace Isaac, MD;  Location: [facility];  Service: Open Heart Surgery;  Laterality: N/A;  . KNEE ARTHROSCOPY  2010   left  . LAPAROSCOPIC APPENDECTOMY N/A 01/06/2017   Procedure: APPENDECTOMY LAPAROSCOPIC;  Surgeon: Ralene Ok, MD;  Location: [facility];  Service: General;  Laterality: N/A;  . Retactment of fingers     as a child , sewed with sewing machine- Index and middle finger  . UPPER GASTROINTESTINAL ENDOSCOPY      Social History:  reports that he quit smoking about 29 years ago. His smoking use included Cigarettes. He has a 13.00 pack-year smoking history. He has never used smokeless tobacco. He reports that he drinks alcohol. He reports that he uses drugs, including Cocaine and Marijuana.  Allergies:  Allergies  Allergen Reactions  . Ibuprofen Other (See Comments)    TOLD NOT TO TAKE-PER CARDS  . Iodinated Diagnostic Agents Hives    Hives started 4  days after cta chest was performed, minimal relief w/ benadryl x 3 days, uncertain if reaction was actually iv contrast or other allergen,allergy tests to follow.wife will make [place] aware of results//a.[place]     (Not in a hospital admission)  Blood pressure (!) 148/88, pulse 66, temperature 98.6 F (37 C), temperature source Oral, resp. rate 16, height _0  (1.93 m), weight 293 lb (132.9 kg), SpO2 100 %.  Physical Exam  Constitutional: He is well-developed, well-nourished, and in no distress. No distress.  HENT:  Head: Normocephalic.  Nose: Nose normal.  Mouth/Throat: No oropharyngeal exudate.  Eyes: Pupils are equal, round, and reactive to light. Conjunctivae are normal. Right eye exhibits no discharge. Left eye exhibits no discharge. No scleral icterus.  Neck: Normal range of motion. Neck supple. No thyromegaly present.  Cardiovascular: Normal rate, regular rhythm, S1 normal, S2 normal, normal heart sounds and intact distal pulses.   Pulses:      Radial pulses are 2+ on the right side, and 2+ on the left side.       Dorsalis pedis pulses are 2+ on the right side, and 2+ on the left side.  Pulmonary/Chest: Effort normal and breath sounds normal. No respiratory distress. He has no decreased breath sounds. He has no wheezes. He has no rhonchi. He has no rales.  Abdominal: Soft. Normal appearance and bowel sounds are normal. He exhibits no distension. There is no hepatosplenomegaly. There is tenderness (mild ) in the right upper quadrant and epigastric area.  Musculoskeletal: Normal range of motion. He exhibits no edema, tenderness or deformity.  Skin: Skin is warm and dry. No rash noted. He is not diaphoretic.  Psychiatric: Mood and affect normal.  Nursing note and vitals reviewed.   Results for orders placed or performed during the hospital encounter of 06/08/17 (from the past 48 hour(s))  Lipase, blood     Status: None   Collection Time: 06/08/17  7:32 AM  Result Value Ref Range    Lipase 27 11 - 51 U/L  Comprehensive metabolic panel     Status: Abnormal   Collection Time: 06/08/17  7:32 AM  Result Value Ref Range   Sodium 139 135 - 145 mmol/L   Potassium 3.5 3.5 - 5.1 mmol/L   Chloride 105 101 - 111 mmol/L   CO2 24 22 - 32 mmol/L   Glucose, Bld 148 (H) 65 - 99 mg/dL   BUN 11 6 - 20 mg/dL   Creatinine, Ser 0.76 0.61 - 1.24 mg/dL   Calcium 9.3 8.9 - 10.3 mg/dL   Total Protein 7.2 6.5 - 8.1 g/dL   Albumin 4.1 3.5 - 5.0 g/dL   AST 14 (L) 15 - 41 U/L   ALT 17 17 - 63 U/L   Alkaline Phosphatase 70 38 - 126 U/L  Total Bilirubin 0.7 0.3 - 1.2 mg/dL   GFR calc non Af Amer >60 >60 mL/min   GFR calc Af Amer >60 >60 mL/min    Comment: (NOTE) The eGFR has been calculated using the CKD EPI equation. This calculation has not been validated in all clinical situations. eGFR's persistently <60 mL/min signify possible Chronic Kidney Disease.    Anion gap 10 5 - 15  CBC     Status: Abnormal   Collection Time: 06/08/17  7:32 AM  Result Value Ref Range   WBC 11.7 (H) 4.0 - 10.5 K/uL   RBC 4.89 4.22 - 5.81 MIL/uL   Hemoglobin 14.2 13.0 - 17.0 g/dL   HCT 40.3 39.0 - 52.0 %   MCV 82.4 78.0 - 100.0 fL   MCH 29.0 26.0 - 34.0 pg   MCHC 35.2 30.0 - 36.0 g/dL   RDW 14.2 11.5 - 15.5 %   Platelets 260 150 - 400 K/uL  Urinalysis, Routine w reflex microscopic     Status: Abnormal   Collection Time: 06/08/17 10:50 AM  Result Value Ref Range   Color, Urine YELLOW YELLOW   APPearance CLEAR CLEAR   Specific Gravity, Urine 1.014 1.005 - 1.030   pH 8.0 5.0 - 8.0   Glucose, UA 50 (A) NEGATIVE mg/dL   Hgb urine dipstick NEGATIVE NEGATIVE   Bilirubin Urine NEGATIVE NEGATIVE   Ketones, ur NEGATIVE NEGATIVE mg/dL   Protein, ur NEGATIVE NEGATIVE mg/dL   Nitrite NEGATIVE NEGATIVE   Leukocytes, UA NEGATIVE NEGATIVE   US Abdomen Limited  Result Date: 06/08/2017 CLINICAL DATA:  Right upper quadrant pain. EXAM: ULTRASOUND ABDOMEN LIMITED RIGHT UPPER QUADRANT COMPARISON:  CT abdomen  01/06/2017 FINDINGS: Gallbladder: Tiny cholelithiasis without pericholecystic fluid or gallbladder wall thickening. Negative sonographic Murphy sign. Common bile duct: Diameter: 2 mm Liver: No focal lesion identified. Increased hepatic parenchymal echogenicity. IMPRESSION: 1. Cholelithiasis without sonographic evidence of acute cholecystitis. Electronically Signed   By: Kathreen Devoid   On: 06/08/2017 09:16      Assessment/Plan  Symptomatic cholelithiasis - pt has elected not to have surgery at this time - Dr. Excell Seltzer discussed in length the risk of his symptoms returning and the more severe situations that could arise from not having his gallbladder removed. Pt and wife expressed understanding. Pt still elected to leave with the understanding that he needs to follow up with [place] to discuss elective removal of his gallbladder. He has been given a card and contact information for [facility].   Kalman Drape, [facility] Surgery 06/08/2017, 12:23 PM Pager: [phone] Consults: [phone] Mon-Fri 7:00 am-4:30 pm Sat-Sun 7:00 am-11:30 am

## 2017-06-08 NOTE — ED Notes (Signed)
Bed: WA19 Expected date:  Expected time:  Means of arrival:  Comments: 

## 2017-06-08 NOTE — ED Triage Notes (Signed)
Pt w/ significant cardiac hx c/o epigastric pain radiating to back.  States that he has thrown up twice this morning.  Sx began around 0300 this morning.  No diarrhea.  Denies specific chest pain.

## 2017-06-09 NOTE — Telephone Encounter (Signed)
Attempted to call pt to relay the information from Dr. Elsworth Soho but pt unable to answer. Left message for pt to call us back.

## 2017-06-13 ENCOUNTER — Ambulatory Visit (INDEPENDENT_AMBULATORY_CARE_PROVIDER_SITE_OTHER): Payer: BLUE CROSS/BLUE SHIELD | Admitting: Physician Assistant

## 2017-06-13 ENCOUNTER — Encounter: Payer: Self-pay | Admitting: Physician Assistant

## 2017-06-13 VITALS — BP 153/87 | HR 71 | Temp 98.2°F | Resp 18 | Ht 75.0 in | Wt 300.0 lb

## 2017-06-13 DIAGNOSIS — E66812 Obesity, class 2: Secondary | ICD-10-CM

## 2017-06-13 DIAGNOSIS — M25562 Pain in left knee: Secondary | ICD-10-CM | POA: Diagnosis not present

## 2017-06-13 DIAGNOSIS — Z1159 Encounter for screening for other viral diseases: Secondary | ICD-10-CM | POA: Diagnosis not present

## 2017-06-13 DIAGNOSIS — Z0000 Encounter for general adult medical examination without abnormal findings: Secondary | ICD-10-CM

## 2017-06-13 DIAGNOSIS — I2510 Atherosclerotic heart disease of native coronary artery without angina pectoris: Secondary | ICD-10-CM

## 2017-06-13 DIAGNOSIS — E119 Type 2 diabetes mellitus without complications: Secondary | ICD-10-CM

## 2017-06-13 DIAGNOSIS — E669 Obesity, unspecified: Secondary | ICD-10-CM

## 2017-06-13 DIAGNOSIS — I5032 Chronic diastolic (congestive) heart failure: Secondary | ICD-10-CM

## 2017-06-13 DIAGNOSIS — I10 Essential (primary) hypertension: Secondary | ICD-10-CM | POA: Diagnosis not present

## 2017-06-13 DIAGNOSIS — N401 Enlarged prostate with lower urinary tract symptoms: Secondary | ICD-10-CM | POA: Diagnosis not present

## 2017-06-13 DIAGNOSIS — G8929 Other chronic pain: Secondary | ICD-10-CM

## 2017-06-13 DIAGNOSIS — E785 Hyperlipidemia, unspecified: Secondary | ICD-10-CM

## 2017-06-13 DIAGNOSIS — Z6838 Body mass index (BMI) 38.0-38.9, adult: Secondary | ICD-10-CM

## 2017-06-13 DIAGNOSIS — N138 Other obstructive and reflux uropathy: Secondary | ICD-10-CM | POA: Diagnosis not present

## 2017-06-13 MED ORDER — TRAMADOL HCL 50 MG PO TABS: ORAL_TABLET | ORAL | 0 refills | Status: DC

## 2017-06-13 NOTE — Progress Notes (Signed)
Patient ID: Jonathan Miles, male    DOB: Dec 30, 1960, 56 y.o.   MRN: [id number]  PCP: Harrison Mons, PA-C  Chief Complaint  Patient presents with  . Annual Exam    Upcoming gallbladder surgery.    Subjective:   Presents for [employer].  Colorectal Cancer Screening: 02/2010. Repeat 2021. Prostate Cancer Screening: 01/2016, 3.89, stable from 06/2015 Bone Density Testing: Not yet HIV Screening: 10/2016, NEGATIVE STI Screening: very low risk Seasonal Influenza Vaccination: declined Td/Tdap Vaccination: 01/2014 Pneumococcal Vaccination: next dose at age 63 Zoster Vaccination: not yet Frequency of Dental evaluation: rarely, bad experience previously Frequency of Eye evaluation: Q1-2 years (Dr. Katy Fitch)  He was seen in the ED on 8/13 with upper abdominal pain associated with nausea and vomiting. US of the RUQ revealed Cholelithiasis without sonographic evidence of acute cholecystitis.He was seen by general surgery in the ED and is to follow-up as an outpatient.  Bump under the RIGHT arm earlier this week. ?Spider bite? Initially painful, but improved.  Recent CPAP titration, anticipates adjustment in his set up.  Last labs here were in January. He did have labs done in March, when he was admitted for acute appendicitis.  He was going to [facility] until then, and hasn't returned.    Patient Active Problem List   Diagnosis Date Noted  . Class 2 obesity without serious comorbidity with body mass index (BMI) of 38.0 to 38.9 in adult 01/28/2017  . Asthma 01/26/2017  . Obesity 01/26/2017  . Overactive bladder 01/21/2017  . BPH with obstruction/lower urinary tract symptoms 01/21/2017  . S/P appendectomy 01/07/2017  . Morbid obesity ([place]) 12/17/2016  . Diverticulosis 11/24/2016  . Type 2 diabetes mellitus without complication, without long-term current use of insulin ([place]) 10/31/2016  . Aortic insufficiency 11/23/2012  . Chronic diastolic CHF (congestive  heart failure) ([place]) 11/23/2012  . CAD (coronary artery disease) 11/27/2011  . OSA (obstructive sleep apnea) 11/11/2011  . Aortic root dilatation ([place])   . Hyperlipidemia 01/23/2011  . Hypertension 01/23/2011    Past Medical History:  Diagnosis Date  . Alcohol abuse   . Aortic root dilatation (H[facility])    a. echo 4/12: EF 55-60%, mod to marked dilated ascending aortic root;   b. MRA 4/12: Aortic root 5.5 cm, dilation of left and right coronary cusps, trileaflet aortic valve  . Arthritis   . Asthma    ast attack in early 20's  . CAD (coronary artery disease)   . Chest pain    Myoview 4/12: EF 62%, no ischemia or scar  . CHF (congestive heart failure) ([place])   . Chronic pain    back and hands  . Constipation   . COPD (chronic obstructive pulmonary disease) ([place])   . Depression   . Diabetes mellitus    type 2  . Drug use   . Enlarged prostate   . Fatigue   . GERD (gastroesophageal reflux disease)    modifies with diet  . Hx of echocardiogram    Echo (2/16):  Mod LVH, EF 55-60%, mild to mod AI, mod LAE, mild RAE, Aortic root 38 mm, Asc aorta 43 mm  . Hyperlipidemia   . Hypertension   . Joint pain   . Knee pain, bilateral   . OSA (obstructive sleep apnea) 2006   .  Wears at times  . Osteoarthritis   . Palpitations   . PONV (postoperative nausea and vomiting)   . SOB (shortness of breath)  Prior to Admission medications   Medication Sig Start Date End Date Taking? Authorizing Provider  acetaminophen (TYLENOL) 500 MG tablet TAKE 1-2 TABS EVERY 6 HOURS AS NEEDED FOR PAIN Patient taking differently: Take 500-1,000 mg by mouth every 6 (six) hours as needed for mild pain, moderate pain, fever or headache.  05/26/12  Yes Richardson Dopp T, PA-C  aspirin EC 81 MG tablet Take 81 mg by mouth at bedtime.   Yes [provider]  atorvastatin (LIPITOR) 40 MG tablet Take 1 tablet (40 mg total) by mouth daily. Patient taking differently: Take 40 mg by mouth at bedtime.  10/31/16   Yes Nicola Quesnell, PA-C  carvedilol (COREG) 12.5 MG tablet Take 1 tablet (12.5 mg total) by mouth 2 (two) times daily. 10/31/16  Yes Gertie Broerman, PA-C  diclofenac sodium (VOLTAREN) 1 % GEL Apply 2 g topically 4 (four) times daily. Patient taking differently: Apply 2 g topically 4 (four) times daily as needed (PAIN).  11/19/16  Yes Bardia Wangerin, PA-C  felodipine (PLENDIL) 10 MG 24 hr tablet Take 1 tablet (10 mg total) by mouth daily. Patient taking differently: Take 10 mg by mouth at bedtime.  10/31/16  Yes Chardonnay Holzmann, PA-C  furosemide (LASIX) 40 MG tablet Take 1 tablet (40 mg total) by mouth daily. Patient taking differently: Take 40 mg by mouth daily with breakfast.  10/31/16  Yes Laikynn Pollio, PA-C  HYDROcodone-acetaminophen (NORCO/VICODIN) 5-325 MG tablet Take 1 tablet by mouth every 6 (six) hours as needed for moderate pain. 06/08/17  Yes Lawyer, Harrell Gave, PA-C  lisinopril (PRINIVIL,ZESTRIL) 20 MG tablet Take 1 tablet (20 mg total) by mouth daily. Patient taking differently: Take 20 mg by mouth daily with breakfast.  10/31/16  Yes Jamya Starry, PA-C  loratadine (CLARITIN) 10 MG tablet Take 10 mg by mouth at bedtime.    Yes [provider]  metFORMIN (GLUCOPHAGE) 1000 MG tablet 1 bid Patient taking differently: Take 1,000 mg by mouth 2 (two) times daily with a meal.  11/01/16  Yes Parthiv Mucci, PA-C  mirabegron ER (MYRBETRIQ) 50 MG TB24 tablet Take 50 mg by mouth at bedtime.    Yes [provider]  promethazine (PHENERGAN) 25 MG tablet Take 1 tablet (25 mg total) by mouth every 6 (six) hours as needed for nausea or vomiting. 06/08/17  Yes Lawyer, Harrell Gave, PA-C  tamsulosin (FLOMAX) 0.4 MG CAPS capsule Take 2 capsules (0.8 mg total) by mouth daily after supper. 10/31/16  Yes Mikaya Bunner, PA-C  traMADol (ULTRAM) 50 MG tablet TAKE TWO TABLETS BY MOUTH EVERY 6 HOURS AS NEEDED Patient taking differently: TAKE one to TWO TABLETS BY MOUTH EVERY 6 HOURS AS NEEDED for  pain 05/29/17  Yes Weber, Sarah L, PA-C  Vitamin D, Ergocalciferol, (DRISDOL) 50000 units CAPS capsule Take 1 capsule (50,000 Units total) by mouth every 7 (seven) days. 12/17/16  Yes Beasley, Caren D, MD  oxyCODONE (OXY IR/ROXICODONE) 5 MG immediate release tablet Take 1-2 tablets (5-10 mg total) by mouth every 4 (four) hours as needed for moderate pain. Patient not taking: Reported on 06/13/2017 01/07/17   Earnstine Regal, PA-C    Allergies  Allergen Reactions  . Ibuprofen Other (See Comments)    TOLD NOT TO TAKE-PER CARDS  . Iodinated Diagnostic Agents Hives    Hives started 4 days after cta chest was performed, minimal relief w/ benadryl x 3 days, uncertain if reaction was actually iv contrast or other allergen,allergy tests to follow.wife will make [place] aware of results//a.[email or web address]  Past Surgical History:  Procedure Laterality Date  . APPENDECTOMY    . Colonscopy    . CORONARY ARTERY BYPASS GRAFT  05/07/2012   Procedure: CORONARY ARTERY BYPASS GRAFTING (CABG);  Surgeon: Grace Isaac, MD;  Location: [facility];  Service: Open Heart Surgery;  Laterality: N/A;  . KNEE ARTHROSCOPY  2010   left  . LAPAROSCOPIC APPENDECTOMY N/A 01/06/2017   Procedure: APPENDECTOMY LAPAROSCOPIC;  Surgeon: Ralene Ok, MD;  Location: [facility];  Service: General;  Laterality: N/A;  . Retactment of fingers     as a child , sewed with sewing machine- Index and middle finger  . UPPER GASTROINTESTINAL ENDOSCOPY      Family History  Problem Relation Age of Onset  . Adopted: Yes  . Family history unknown: Yes    Social History   Social History  . Marital status: Married    Spouse name: Thoren Hosang  . Number of children: 0  . Years of education: 12th grade   Occupational History  . 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   Social History Main Topics  . Smoking status: Former Smoker    Packs/day: 1.00    Years: 13.00    Types: Cigarettes    Quit date: 01/23/1988  .  Smokeless tobacco: Never Used  . Alcohol use Yes     Comment: quit 1989  . Drug use: No     Comment: previous cocaine and marijuana use, quit 1988  . Sexual activity: Not Currently    Partners: Female   Other Topics Concern  . None   Social History Narrative   Married, lives in [place]. Therapist, occupational for [employer].    Pt is adopted. Unsure of his family medical history.       Review of Systems  Constitutional: Positive for diaphoresis. Negative for activity change, appetite change, chills, fatigue, fever and unexpected weight change.  HENT: Positive for dental problem and sinus pressure. Negative for congestion, drooling, ear discharge, ear pain, facial swelling, hearing loss, mouth sores, nosebleeds, postnasal drip, rhinorrhea, sinus pain, sneezing, sore throat, tinnitus, trouble swallowing and voice change.   Eyes: Positive for visual disturbance. Negative for photophobia, pain, discharge, redness and itching.  Respiratory: Positive for apnea and shortness of breath. Negative for cough, choking, chest tightness, wheezing and stridor.   Cardiovascular: Negative.   Gastrointestinal: Positive for abdominal distention. Negative for abdominal pain, anal bleeding, blood in stool, constipation, diarrhea, nausea, rectal pain and vomiting.  Endocrine: Positive for polydipsia. Negative for cold intolerance, heat intolerance, polyphagia and polyuria.  Genitourinary: Negative.   Musculoskeletal: Positive for arthralgias (LEFT knee, chronic), back pain and myalgias. Negative for gait problem, joint swelling, neck pain and neck stiffness.  Skin: Negative.   Allergic/Immunologic: Positive for environmental allergies (seasonal). Negative for food allergies and immunocompromised state.  Neurological: Negative.   Hematological: Negative.   Psychiatric/Behavioral: Negative.         Objective:  Physical Exam  Constitutional: He is oriented to person, place, and time. He appears  well-developed and well-nourished. He is active and cooperative.  Non-toxic appearance. He does not have a sickly appearance. He does not appear ill. No distress.  BP (!) 153/87   Pulse 71   Temp 98.2 F (36.8 C) (Oral)   Resp 18   Ht 6\' 3"  (1.905 m)   Wt 300 lb (136.1 kg)   SpO2 96%   BMI 37.50 kg/m    HENT:  Head: Normocephalic and atraumatic.  Right  Ear: Hearing, tympanic membrane, external ear and ear canal normal.  Left Ear: Hearing, tympanic membrane, external ear and ear canal normal.  Nose: Nose normal.  Mouth/Throat: Uvula is midline, oropharynx is clear and moist and mucous membranes are normal. He does not have dentures. No oral lesions. No trismus in the jaw. Normal dentition. No dental abscesses, uvula swelling, lacerations or dental caries.  Eyes: Pupils are equal, round, and reactive to light. Conjunctivae, EOM and lids are normal. Right eye exhibits no discharge. Left eye exhibits no discharge. No scleral icterus.  Fundoscopic exam:      The right eye shows no arteriolar narrowing, no AV nicking, no exudate, no hemorrhage and no papilledema.       The left eye shows no arteriolar narrowing, no AV nicking, no exudate, no hemorrhage and no papilledema.  Neck: Normal range of motion, full passive range of motion without pain and phonation normal. Neck supple. No spinous process tenderness and no muscular tenderness present. No neck rigidity. No tracheal deviation, no edema, no erythema and normal range of motion present. No thyromegaly present.  Cardiovascular: Normal rate, regular rhythm, S1 normal, S2 normal, normal heart sounds, intact distal pulses and normal pulses.  Exam reveals no gallop and no friction rub.   No murmur heard. Pulmonary/Chest: Effort normal and breath sounds normal. No respiratory distress. He has no wheezes. He has no rales.  Abdominal: Soft. Normal appearance and bowel sounds are normal. He exhibits no distension and no mass. There is no  hepatosplenomegaly. There is no tenderness. There is no rebound and no guarding. No hernia.  Musculoskeletal: Normal range of motion. He exhibits no edema or tenderness.       Cervical back: Normal. He exhibits normal range of motion, no tenderness, no bony tenderness, no swelling, no edema, no deformity, no laceration, no pain, no spasm and normal pulse.       Thoracic back: Normal.       Lumbar back: Normal.  Lymphadenopathy:       Head (right side): No submental, no submandibular, no tonsillar, no preauricular, no posterior auricular and no occipital adenopathy present.       Head (left side): No submental, no submandibular, no tonsillar, no preauricular, no posterior auricular and no occipital adenopathy present.    He has no cervical adenopathy.       Right: No supraclavicular adenopathy present.       Left: No supraclavicular adenopathy present.  Neurological: He is alert and oriented to person, place, and time. He has normal strength and normal reflexes. He displays no tremor. No cranial nerve deficit. He exhibits normal muscle tone. Coordination and gait normal.  Skin: Skin is warm, dry and intact. Lesion (resolving lesion, RIGHT axilla) noted. No abrasion, no ecchymosis, no laceration and no rash noted. He is not diaphoretic. No cyanosis or erythema. No pallor. Nails show no clubbing.  Psychiatric: He has a normal mood and affect. His speech is normal and behavior is normal. Judgment and thought content normal. Cognition and memory are normal.           Assessment & Plan:   Problem List Items Addressed This Visit    Hyperlipidemia    Await labs. Adjust regimen as indicated by results. Goal LDL <70%.      Relevant Orders   Lipid panel (Completed)   Hypertension    Elevated today. Needs improved control.      CAD (coronary artery disease)    Needs improved blood pressure  control. Await lipids. Follow-up with cardiology as directed.      Chronic diastolic CHF (congestive  heart failure) (HCC)    No aevidence of active CHF today. Needs good HTN and lipid control. Follow-up with cardiology as directed.      Type 2 diabetes mellitus without complication, without long-term current use of insulin ([place])    Await labs. Adjust regimen as indicated by results.       Relevant Orders   Hemoglobin A1c (Completed)   BPH with obstruction/lower urinary tract symptoms    Given frequent nocturia, recommend that he follow-up with his urologist. Consider diabetes as cause. Await labs.      Class 2 obesity without serious comorbidity with body mass index (BMI) of 38.0 to 38.9 in adult    Encouraged follow-up with medical weight management.      Chronic pain of left knee   Relevant Medications   traMADol (ULTRAM) 50 MG tablet    Other Visit Diagnoses    Annual physical exam    -  Primary   Need for hepatitis C screening test       Relevant Orders   Hepatitis C antibody (Completed)       Return in about 6 months (around 12/14/2017) for re-evaluation of diabetes, blood pressure.   Fara Chute, PA-C [facility]

## 2017-06-13 NOTE — Patient Instructions (Signed)
     IF you received an x-ray today, you will receive an invoice from [facility]. Please contact [facility] at [phone] with questions or concerns regarding your invoice.   IF you received labwork today, you will receive an invoice from [facility]. Please contact [facility] at [phone] with questions or concerns regarding your invoice.   Our billing staff will not be able to assist you with questions regarding bills from these companies.  You will be contacted with the lab results as soon as they are available. The fastest way to get your results is to activate your My Chart account. Instructions are located on the last page of this paperwork. If you have not heard from us regarding the results in 2 weeks, please contact this office.     

## 2017-06-14 LAB — LIPID PANEL
Chol/HDL Ratio: 4 ratio (ref 0.0–5.0)
Cholesterol, Total: 160 mg/dL (ref 100–199)
HDL: 40 mg/dL (ref >39)
LDL Calculated: 105 mg/dL — ABNORMAL HIGH (ref 0–99)
Triglycerides: 75 mg/dL (ref 0–149)
VLDL Cholesterol Cal: 15 mg/dL (ref 5–40)

## 2017-06-14 LAB — HEPATITIS C ANTIBODY: Hep C Virus Ab: <0.1 s/co ratio (ref 0.0–0.9)

## 2017-06-14 LAB — HEMOGLOBIN A1C
Est. average glucose Bld gHb Est-mCnc: 117 mg/dL
Hgb A1c MFr Bld: 5.7 % — ABNORMAL HIGH (ref 4.8–5.6)

## 2017-06-17 NOTE — Telephone Encounter (Signed)
Spoke with patient. He is aware of the CPAP order. Advised patient that I will place order today. Also advised patient that when he receives the machine, to give our office a call so that we can get him scheduled for a 6 week follow up. Explained to patient that sometimes there is a delay with DMEs and patient will sometimes get their machines as late as 3 weeks before their appt. Pt verbalized understanding. Nothing further needed at time of call.

## 2017-06-17 NOTE — Telephone Encounter (Signed)
Patient is returning phone call. [phone].

## 2017-06-20 ENCOUNTER — Encounter: Payer: Self-pay | Admitting: Physician Assistant

## 2017-06-28 ENCOUNTER — Other Ambulatory Visit (INDEPENDENT_AMBULATORY_CARE_PROVIDER_SITE_OTHER): Payer: Self-pay | Admitting: Family Medicine

## 2017-07-01 DIAGNOSIS — M170 Bilateral primary osteoarthritis of knee: Secondary | ICD-10-CM | POA: Insufficient documentation

## 2017-07-01 NOTE — Assessment & Plan Note (Signed)
Given frequent nocturia, recommend that he follow-up with his urologist. Consider diabetes as cause. Await labs.

## 2017-07-01 NOTE — Assessment & Plan Note (Signed)
Await labs. Adjust regimen as indicated by results.  

## 2017-07-01 NOTE — Assessment & Plan Note (Signed)
No aevidence of active CHF today. Needs good HTN and lipid control. Follow-up with cardiology as directed.

## 2017-07-01 NOTE — Assessment & Plan Note (Signed)
Needs improved blood pressure control. Await lipids. Follow-up with cardiology as directed.

## 2017-07-01 NOTE — Assessment & Plan Note (Signed)
Elevated today. Needs improved control.

## 2017-07-01 NOTE — Assessment & Plan Note (Signed)
Await labs. Adjust regimen as indicated by results. Goal LDL <70 

## 2017-07-01 NOTE — Assessment & Plan Note (Addendum)
Encouraged follow-up with medical weight management.

## 2017-07-18 ENCOUNTER — Other Ambulatory Visit: Payer: Self-pay | Admitting: Family Medicine

## 2017-07-18 DIAGNOSIS — M25562 Pain in left knee: Principal | ICD-10-CM

## 2017-07-18 DIAGNOSIS — G8929 Other chronic pain: Secondary | ICD-10-CM

## 2017-07-18 NOTE — Telephone Encounter (Signed)
I have never seen this pt. Forwarding req to PCP.

## 2017-07-20 ENCOUNTER — Other Ambulatory Visit: Payer: Self-pay | Admitting: Family Medicine

## 2017-07-20 NOTE — Telephone Encounter (Signed)
Meds ordered this encounter  Medications  . traMADol (ULTRAM) 50 MG tablet    Sig: TAKE TWO TABLETS BY MOUTH EVERY 6 HOURS AS NEEDED    Dispense:  90 tablet    Refill:  0

## 2017-07-21 NOTE — Telephone Encounter (Signed)
Patient informed picking prescription up

## 2017-08-03 NOTE — Telephone Encounter (Signed)
PRESCRIPTION FOR TRAMADOL NEVER PICKED UP\ SHREDDED

## 2017-08-05 ENCOUNTER — Encounter: Payer: Self-pay | Admitting: Physician Assistant

## 2017-08-05 ENCOUNTER — Ambulatory Visit (INDEPENDENT_AMBULATORY_CARE_PROVIDER_SITE_OTHER): Payer: BLUE CROSS/BLUE SHIELD | Admitting: Physician Assistant

## 2017-08-05 ENCOUNTER — Telehealth: Payer: Self-pay

## 2017-08-05 VITALS — BP 128/80 | HR 73 | Temp 98.3°F | Resp 18 | Ht 75.0 in | Wt 294.2 lb

## 2017-08-05 DIAGNOSIS — F4323 Adjustment disorder with mixed anxiety and depressed mood: Secondary | ICD-10-CM | POA: Diagnosis not present

## 2017-08-05 DIAGNOSIS — Z6836 Body mass index (BMI) 36.0-36.9, adult: Secondary | ICD-10-CM | POA: Diagnosis not present

## 2017-08-05 DIAGNOSIS — M722 Plantar fascial fibromatosis: Secondary | ICD-10-CM | POA: Diagnosis not present

## 2017-08-05 DIAGNOSIS — I10 Essential (primary) hypertension: Secondary | ICD-10-CM | POA: Diagnosis not present

## 2017-08-05 DIAGNOSIS — G4733 Obstructive sleep apnea (adult) (pediatric): Secondary | ICD-10-CM | POA: Diagnosis not present

## 2017-08-05 DIAGNOSIS — E119 Type 2 diabetes mellitus without complications: Secondary | ICD-10-CM

## 2017-08-05 DIAGNOSIS — M170 Bilateral primary osteoarthritis of knee: Secondary | ICD-10-CM | POA: Diagnosis not present

## 2017-08-05 DIAGNOSIS — K8020 Calculus of gallbladder without cholecystitis without obstruction: Secondary | ICD-10-CM | POA: Insufficient documentation

## 2017-08-05 MED ORDER — DICLOFENAC SODIUM 1 % TD GEL: 2.0000 g | Freq: Four times a day (QID) | TRANSDERMAL | 99 refills | Status: DC | PRN

## 2017-08-05 MED ORDER — SERTRALINE HCL 50 MG PO TABS: 50.0000 mg | ORAL_TABLET | Freq: Every day | ORAL | 3 refills | Status: DC

## 2017-08-05 NOTE — Telephone Encounter (Signed)
Chelle, Received a request for Diclofenc sodium 1% gel.  This has not been prescribed since 10/31/2016 with one RF.  Did not know if you wanted to approve or deny this request.  Should have gone to Refill pool, but somehow came to me. Please advise, Octaviano Mukai

## 2017-08-05 NOTE — Assessment & Plan Note (Signed)
Well controlled. No indication to update labs today.

## 2017-08-05 NOTE — Assessment & Plan Note (Signed)
Continue with CPAP

## 2017-08-05 NOTE — Assessment & Plan Note (Signed)
Trial of SSRI. Anticipatory guidance provided.

## 2017-08-05 NOTE — Assessment & Plan Note (Signed)
Possibly due to altered gait due to knee pain (or vice versa, or both). Continue stretching, add ice massage.

## 2017-08-05 NOTE — Assessment & Plan Note (Signed)
Encouraged him to contact Dr. Gladstone Lighter to schedule follow-up evaluation.

## 2017-08-05 NOTE — Telephone Encounter (Signed)
Addressed at today's visit

## 2017-08-05 NOTE — Patient Instructions (Addendum)
Continue with ice and heat for the knee pain. Remember to schedule an appointment with Dr. Gladstone Lighter to discuss various options to manage your pain including injections.   Please continue to with stretching of both feet.   For the first week on the sertraline (Zoloft), take 1/2 tablet each day. Then increase to the whole tablet.   IF you received an x-ray today, you will receive an invoice from [facility]. Please contact [facility] at [phone] with questions or concerns regarding your invoice.   IF you received labwork today, you will receive an invoice from [facility]. Please contact [facility] at [phone] with questions or concerns regarding your invoice.   Our billing staff will not be able to assist you with questions regarding bills from these companies.  You will be contacted with the lab results as soon as they are available. The fastest way to get your results is to activate your My Chart account. Instructions are located on the last page of this paperwork. If you have not heard from [place] regarding the results in 2 weeks, please contact this office.     Plantar Fasciitis Plantar fasciitis is a painful foot condition that affects the heel. It occurs when the band of tissue that connects the toes to the heel bone (plantar fascia) becomes irritated. This can happen after exercising too much or doing other repetitive activities (overuse injury). The pain from plantar fasciitis can range from mild irritation to severe pain that makes it difficult for you to walk or move. The pain is usually worse in the morning or after you have been sitting or lying down for a while. What are the causes? This condition may be caused by:  Standing for long periods of time.  Wearing shoes that do not fit.  Doing high-impact activities, including running, aerobics, and ballet.  Being overweight.  Having an abnormal way of walking (gait).  Having tight calf muscles.  Having high  arches in your feet.  Starting a new athletic activity.  What are the signs or symptoms? The main symptom of this condition is heel pain. Other symptoms include:  Pain that gets worse after activity or exercise.  Pain that is worse in the morning or after resting.  Pain that goes away after you walk for a few minutes.  How is this diagnosed? This condition may be diagnosed based on your signs and symptoms. Your health care provider will also do a physical exam to check for:  A tender area on the bottom of your foot.  A high arch in your foot.  Pain when you move your foot.  Difficulty moving your foot.  You may also need to have imaging studies to confirm the diagnosis. These can include:  X-rays.  Ultrasound.  MRI.  How is this treated? Treatment for plantar fasciitis depends on the severity of the condition. Your treatment may include:  Rest, ice, and over-the-counter pain medicines to manage your pain.  Exercises to stretch your calves and your plantar fascia.  A splint that holds your foot in a stretched, upward position while you sleep (night splint).  Physical therapy to relieve symptoms and prevent problems in the future.  Cortisone injections to relieve severe pain.  Extracorporeal shock wave therapy (ESWT) to stimulate damaged plantar fascia with electrical impulses. It is often used as a last resort before surgery.  Surgery, if other treatments have not worked after 12 months.  Follow these instructions at home:  Take medicines only as directed by  your health care provider.  Avoid activities that cause pain.  Roll the bottom of your foot over a bag of ice or a bottle of cold water. Do this for 20 minutes, 3-4 times a day.  Perform simple stretches as directed by your health care provider.  Try wearing athletic shoes with air-sole or gel-sole cushions or soft shoe inserts.  Wear a night splint while sleeping, if directed by your health care  provider.  Keep all follow-up appointments with your health care provider. How is this prevented?  Do not perform exercises or activities that cause heel pain.  Consider finding low-impact activities if you continue to have problems.  Lose weight if you need to. The best way to prevent plantar fasciitis is to avoid the activities that aggravate your plantar fascia. Contact a health care provider if:  Your symptoms do not go away after treatment with home care measures.  Your pain gets worse.  Your pain affects your ability to move or do your daily activities. This information is not intended to replace advice given to you by your health care provider. Make sure you discuss any questions you have with your health care provider. Document Released: 07/08/2001 Document Revised: 03/17/2016 Document Reviewed: 08/23/2014 Elsevier Interactive Patient Education  [employer].

## 2017-08-05 NOTE — Assessment & Plan Note (Signed)
Controlled. No changes today. 

## 2017-08-05 NOTE — Assessment & Plan Note (Signed)
Continue efforts for healthy lifestyle changes. Primarily dietary, as his activity is limited by OA of the knees.

## 2017-08-05 NOTE — Progress Notes (Signed)
Patient ID: Jonathan Miles, male    DOB: Jul 19, 1961, 56 y.o.   MRN: [id number]  PCP: Harrison Mons, PA-C  Chief Complaint  Patient presents with  . Arthritis    pt states he having a flare of arthritis in his knees and back.  . Stress    pt states he is having headaches, anxiety, moodiness. Depression scale score 9. GAD 7 score 13    Subjective:   Presents for evaluation of bilateral, L>R knee pain, low back pain and situational stress.  He is using tramadol and diclofenac gel, compression knee sleeves, and occasional opiate. Takes tramadol and opiate ONLY at HS, not comfortable taking them before or while at work. Icing and heat application seem to help the swelling and pain. He had LEFT knee arthroscopy approximately 8 years ago, but has not followed up with orthopedics since then.  He also has developed pain on the bottoms of both feet, along the arches, that is worst when he first gets up, and eases off after about 10 steps or so. Stretching and topical OTC lidocaine gel help.  He is experiencing increasing stress at work following management change. He's trying to stay through the end of the year, as he will have earned a significant bonus this year, but is considering looking for alternate employment. The stress of his unhappiness is manifesting as headaches, irritability, negative attitude and feelings of depression.  Yesterday he experienced dizziness lasting about 20 minutes. It resolved with sitting down, but he remained feeling less than his usual self for the remainder of the day.  He is tolerating CPAP, new x 3 weeks. He needs a lap chole, and is working out the scheduling with his Psychologist, sport and exercise and work. Sees cardiology 10/15/2017. Last eye exam was 2016.    Review of Systems As above.  Depression screen [facility] 2/9 08/05/2017 08/05/2017 06/13/2017 12/03/2016 10/31/2016  Decreased Interest 1 0 0 1 0  Down, Depressed, Hopeless 1 0 0 1 0  PHQ - 2 Score 2 0 0 2 0  Altered  sleeping 1 - - 2 -  Tired, decreased energy 3 - - 2 -  Change in appetite 1 - - 1 -  Feeling bad or failure about yourself  0 - - 1 -  Trouble concentrating 1 - - 2 -  Moving slowly or fidgety/restless 1 - - 1 -  Suicidal thoughts 0 - - 0 -  PHQ-9 Score 9 - - 11 -  Difficult doing work/chores Somewhat difficult - - - -    GAD 7 : Generalized Anxiety Score 08/05/2017  Nervous, Anxious, on Edge 3  Control/stop worrying 1  Worry too much - different things 1  Trouble relaxing 3  Restless 1  Easily annoyed or irritable 3  Afraid - awful might happen 1  Total GAD 7 Score 13  Anxiety Difficulty Somewhat difficult      Patient Active Problem List   Diagnosis Date Noted  . BMI 36.0-36.9,adult 08/05/2017  . Cholelithiasis 08/05/2017  . Bilateral plantar fasciitis 08/05/2017  . Situational mixed anxiety and depressive disorder 08/05/2017  . Chronic pain of left knee 07/01/2017  . Asthma 01/26/2017  . Overactive bladder 01/21/2017  . BPH with obstruction/lower urinary tract symptoms 01/21/2017  . S/P appendectomy 01/07/2017  . Diverticulosis 11/24/2016  . Type 2 diabetes mellitus without complication, without long-term current use of insulin ([place]) 10/31/2016  . Aortic insufficiency 11/23/2012  . Chronic diastolic CHF (congestive heart failure) ([place]) 11/23/2012  .  CAD (coronary artery disease) 11/27/2011  . OSA (obstructive sleep apnea) 11/11/2011  . Aortic root dilatation ([place])   . Hyperlipidemia 01/23/2011  . Hypertension 01/23/2011     Prior to Admission medications   Medication Sig Start Date End Date Taking? Authorizing Provider  acetaminophen (TYLENOL) 500 MG tablet TAKE 1-2 TABS EVERY 6 HOURS AS NEEDED FOR PAIN Patient taking differently: Take 500-1,000 mg by mouth every 6 (six) hours as needed for mild pain, moderate pain, fever or headache.  05/26/12  Yes Richardson Dopp T, PA-C  aspirin EC 81 MG tablet Take 81 mg by mouth at bedtime.   Yes [provider]    atorvastatin (LIPITOR) 40 MG tablet Take 1 tablet (40 mg total) by mouth daily. Patient taking differently: Take 40 mg by mouth at bedtime.  10/31/16  Yes Pius Byrom, PA-C  carvedilol (COREG) 12.5 MG tablet Take 1 tablet (12.5 mg total) by mouth 2 (two) times daily. 10/31/16  Yes Negar Sieler, PA-C  diclofenac sodium (VOLTAREN) 1 % GEL Apply 2 g topically 4 (four) times daily. Patient taking differently: Apply 2 g topically 4 (four) times daily as needed (PAIN).  11/19/16  Yes Haya Hemler, PA-C  felodipine (PLENDIL) 10 MG 24 hr tablet Take 1 tablet (10 mg total) by mouth daily. Patient taking differently: Take 10 mg by mouth at bedtime.  10/31/16  Yes Cricket Goodlin, PA-C  furosemide (LASIX) 40 MG tablet Take 1 tablet (40 mg total) by mouth daily. Patient taking differently: Take 40 mg by mouth daily with breakfast.  10/31/16  Yes Berley Gambrell, PA-C  HYDROcodone-acetaminophen (NORCO/VICODIN) 5-325 MG tablet Take 1 tablet by mouth every 6 (six) hours as needed for moderate pain. 06/08/17  Yes Lawyer, Harrell Gave, PA-C  lisinopril (PRINIVIL,ZESTRIL) 20 MG tablet Take 1 tablet (20 mg total) by mouth daily. Patient taking differently: Take 20 mg by mouth daily with breakfast.  10/31/16  Yes Audrie Kuri, PA-C  loratadine (CLARITIN) 10 MG tablet Take 10 mg by mouth at bedtime.    Yes [provider]  metFORMIN (GLUCOPHAGE) 1000 MG tablet 1 bid Patient taking differently: Take 1,000 mg by mouth 2 (two) times daily with a meal.  11/01/16  Yes Shamecka Hocutt, PA-C  mirabegron ER (MYRBETRIQ) 50 MG TB24 tablet Take 50 mg by mouth at bedtime.    Yes [provider]  oxyCODONE (OXY IR/ROXICODONE) 5 MG immediate release tablet Take 1-2 tablets (5-10 mg total) by mouth every 4 (four) hours as needed for moderate pain. 01/07/17  Yes Earnstine Regal, PA-C  promethazine (PHENERGAN) 25 MG tablet Take 1 tablet (25 mg total) by mouth every 6 (six) hours as needed for nausea or vomiting.  06/08/17  Yes Lawyer, Harrell Gave, PA-C  tamsulosin (FLOMAX) 0.4 MG CAPS capsule Take 2 capsules (0.8 mg total) by mouth daily after supper. 10/31/16  Yes Alahni Varone, PA-C  traMADol (ULTRAM) 50 MG tablet TAKE TWO TABLETS BY MOUTH EVERY 6 HOURS AS NEEDED 07/20/17  Yes Tiffanyann Deroo, PA-C  Vitamin D, Ergocalciferol, (DRISDOL) 50000 units CAPS capsule Take 1 capsule (50,000 Units total) by mouth every 7 (seven) days. 12/17/16  Yes Dennard Nip D, MD     Allergies  Allergen Reactions  . Ibuprofen Other (See Comments)    TOLD NOT TO TAKE-PER CARDS  . Iodinated Diagnostic Agents Hives    Hives started 4 days after cta chest was performed, minimal relief w/ benadryl x 3 days, uncertain if reaction was actually iv contrast or other allergen,allergy tests to follow.wife  will make [place] aware of results//a.calhoun       Objective:  Physical Exam  Constitutional: He is oriented to person, place, and time. He appears well-developed and well-nourished. He is active and cooperative. No distress.  BP 128/80 (BP Location: Right Arm, Patient Position: Sitting, Cuff Size: Normal)   Pulse 73   Temp 98.3 F (36.8 C) (Oral)   Resp 18   Ht 6\' 3"  (1.905 m)   Wt 294 lb 3.2 oz (133.4 kg)   SpO2 98%   BMI 36.77 kg/m   HENT:  Head: Normocephalic and atraumatic.  Right Ear: Hearing normal.  Left Ear: Hearing normal.  Eyes: Conjunctivae are normal. No scleral icterus.  Neck: Normal range of motion. Neck supple. No thyromegaly present.  Cardiovascular: Normal rate, regular rhythm and normal heart sounds.   Pulses:      Radial pulses are 2+ on the right side, and 2+ on the left side.  Pulmonary/Chest: Effort normal and breath sounds normal.  Lymphadenopathy:       Head (right side): No tonsillar, no preauricular, no posterior auricular and no occipital adenopathy present.       Head (left side): No tonsillar, no preauricular, no posterior auricular and no occipital adenopathy present.    He has no  cervical adenopathy.       Right: No supraclavicular adenopathy present.       Left: No supraclavicular adenopathy present.  Neurological: He is alert and oriented to person, place, and time. No sensory deficit.  Skin: Skin is warm, dry and intact. No rash noted. No cyanosis or erythema. Nails show no clubbing.  Psychiatric: He has a normal mood and affect. His speech is normal and behavior is normal.       Assessment & Plan:   Problem List Items Addressed This Visit    Hypertension    Controlled. No changes today.      OSA (obstructive sleep apnea)    Continue with CPAP.      Type 2 diabetes mellitus without complication, without long-term current use of insulin (HCC)    Well controlled. No indication to update labs today.      BMI 36.0-36.9,adult    Continue efforts for healthy lifestyle changes. Primarily dietary, as his activity is limited by OA of the knees.      Bilateral primary osteoarthritis of knee - Primary    Encouraged him to contact Dr. Gladstone Lighter to schedule follow-up evaluation.      Relevant Medications   diclofenac sodium (VOLTAREN) 1 % GEL   Bilateral plantar fasciitis    Possibly due to altered gait due to knee pain (or vice versa, or both). Continue stretching, add ice massage.      Situational mixed anxiety and depressive disorder    Trial of SSRI. Anticipatory guidance provided.      Relevant Medications   sertraline (ZOLOFT) 50 MG tablet       Return in about 6 weeks (around 09/16/2017) for re-evaluation of mood.   Fara Chute, PA-C [facility]

## 2017-08-05 NOTE — Progress Notes (Signed)
Subjective:    Patient ID: Jonathan Miles, male    DOB: 08/24/1961, 56 y.o.   MRN: [id number]  HPI Chief Complaint  Patient presents with  . Arthritis    pt states he having a flare of arthritis in his knees and back.  . Stress    pt states he is having headaches, anxiety, moodiness. Depression scale score 9.   Patient presents today for evaluation of a chronic LEFT knee pain and low back pain. He takes Tramadol for pain relief, but states that it helps but he only takes it at night because he is not comfortable taking in the morning before work. He uses Voltaren gel, which helps a bit with the pain. He wears compression sleeves on his knee, but both knees swell, worse on the LEFT. He has tried icing the knees which help with the swelling and heat for soothing the pain. He took a hydrocodone last night, which helped the pain and allowed him to sleep. He is 8 years s/p LEFT knee arthroscopy per Dr. Gladstone Lighter, but has not followed up with him since.    Patient reports bilateral feet pain in the arch. He does a lot of walking and standing at work. He notes the first 10 steps he takes in the morning will be painful but eases up as the day progresses. He complains of pain in the heel that goes up to the arch. He has tried using lidocaine cream on the soles and stretching, which helps a bit.   Patient also complains of worsening stress from work due to different management. He is trying to stay at the job through December of this year, but is considering looking for a new job with similar pay. He notes 1-2 headaches in the occiput and neck from work.  His wife reports the patient comes home everyday from work with a negative attitude about work. He will call her in the middle of the day to talk about the stress of work. He states he does not WANT to start a prescription, but thinks he might NEED a prescription. He notes that his friend has taken Xanax in the past for situational anxiety and depression  and reports it helped him a lot.   He states he started getting dizzy yesterday after walking around for 20 minutes in the morning. He sat down for 10 minutes to recover and continued but still did not feel great afterwards. He is scheduled to see his cardiologist Dr. Servando Snare on 10/15/2017  Last eye exam in 2016.   He has sleep apnea, first initiated the CPAP machine 3 weeks ago, and uses it every night.   He is trying to schedule a laparoscopic cholecystectomy for the first of the new year.   Wt Readings from Last 3 Encounters:  08/05/17 294 lb 3.2 oz (133.4 kg)  06/13/17 300 lb (136.1 kg)  06/08/17 293 lb (132.9 kg)   Depression screen [facility] 2/9 08/05/2017 08/05/2017 06/13/2017 12/03/2016 10/31/2016  Decreased Interest 1 0 0 1 0  Down, Depressed, Hopeless 1 0 0 1 0  PHQ - 2 Score 2 0 0 2 0  Altered sleeping 1 - - 2 -  Tired, decreased energy 3 - - 2 -  Change in appetite 1 - - 1 -  Feeling bad or failure about yourself  0 - - 1 -  Trouble concentrating 1 - - 2 -  Moving slowly or fidgety/restless 1 - - 1 -  Suicidal thoughts 0 - -  0 -  PHQ-9 Score 9 - - 11 -  Difficult doing work/chores Somewhat difficult - - - -   Review of Systems  Constitutional: Positive for diaphoresis (stable x8yr). Negative for chills and fever.  Respiratory: Negative for cough and shortness of breath.   Cardiovascular: Negative for chest pain and palpitations.  Gastrointestinal: Positive for abdominal pain (gallbladder). Negative for blood in stool, constipation, diarrhea, nausea and vomiting.  Allergic/Immunologic: Positive for environmental allergies.  Neurological: Positive for dizziness and headaches. Negative for syncope.  Psychiatric/Behavioral: Positive for sleep disturbance.   Patient Active Problem List   Diagnosis Date Noted  . Chronic pain of left knee 07/01/2017  . Class 2 obesity without serious comorbidity with body mass index (BMI) of 38.0 to 38.9 in adult 01/28/2017  . Asthma 01/26/2017    . Obesity 01/26/2017  . Overactive bladder 01/21/2017  . BPH with obstruction/lower urinary tract symptoms 01/21/2017  . S/P appendectomy 01/07/2017  . Morbid obesity ([place]) 12/17/2016  . Diverticulosis 11/24/2016  . Type 2 diabetes mellitus without complication, without long-term current use of insulin ([place]) 10/31/2016  . Aortic insufficiency 11/23/2012  . Chronic diastolic CHF (congestive heart failure) ([place]) 11/23/2012  . CAD (coronary artery disease) 11/27/2011  . OSA (obstructive sleep apnea) 11/11/2011  . Aortic root dilatation ([place])   . Hyperlipidemia 01/23/2011  . Hypertension 01/23/2011   Prior to Admission medications   Medication Sig Start Date End Date Taking? Authorizing Provider  acetaminophen (TYLENOL) 500 MG tablet TAKE 1-2 TABS EVERY 6 HOURS AS NEEDED FOR PAIN Patient taking differently: Take 500-1,000 mg by mouth every 6 (six) hours as needed for mild pain, moderate pain, fever or headache.  05/26/12  Yes Richardson Dopp T, PA-C  aspirin EC 81 MG tablet Take 81 mg by mouth at bedtime.   Yes [provider]  atorvastatin (LIPITOR) 40 MG tablet Take 1 tablet (40 mg total) by mouth daily. Patient taking differently: Take 40 mg by mouth at bedtime.  10/31/16  Yes Jeffery, Chelle, PA-C  carvedilol (COREG) 12.5 MG tablet Take 1 tablet (12.5 mg total) by mouth 2 (two) times daily. 10/31/16  Yes Jeffery, Chelle, PA-C  diclofenac sodium (VOLTAREN) 1 % GEL Apply 2 g topically 4 (four) times daily. Patient taking differently: Apply 2 g topically 4 (four) times daily as needed (PAIN).  11/19/16  Yes Jeffery, Chelle, PA-C  felodipine (PLENDIL) 10 MG 24 hr tablet Take 1 tablet (10 mg total) by mouth daily. Patient taking differently: Take 10 mg by mouth at bedtime.  10/31/16  Yes Jeffery, Chelle, PA-C  furosemide (LASIX) 40 MG tablet Take 1 tablet (40 mg total) by mouth daily. Patient taking differently: Take 40 mg by mouth daily with breakfast.  10/31/16  Yes Jeffery, Chelle, PA-C   HYDROcodone-acetaminophen (NORCO/VICODIN) 5-325 MG tablet Take 1 tablet by mouth every 6 (six) hours as needed for moderate pain. 06/08/17  Yes Lawyer, Harrell Gave, PA-C  lisinopril (PRINIVIL,ZESTRIL) 20 MG tablet Take 1 tablet (20 mg total) by mouth daily. Patient taking differently: Take 20 mg by mouth daily with breakfast.  10/31/16  Yes Jeffery, Chelle, PA-C  loratadine (CLARITIN) 10 MG tablet Take 10 mg by mouth at bedtime.    Yes [provider]  metFORMIN (GLUCOPHAGE) 1000 MG tablet 1 bid Patient taking differently: Take 1,000 mg by mouth 2 (two) times daily with a meal.  11/01/16  Yes Jeffery, Chelle, PA-C  mirabegron ER (MYRBETRIQ) 50 MG TB24 tablet Take 50 mg by mouth at bedtime.  Yes [provider]  oxyCODONE (OXY IR/ROXICODONE) 5 MG immediate release tablet Take 1-2 tablets (5-10 mg total) by mouth every 4 (four) hours as needed for moderate pain. 01/07/17  Yes Earnstine Regal, PA-C  promethazine (PHENERGAN) 25 MG tablet Take 1 tablet (25 mg total) by mouth every 6 (six) hours as needed for nausea or vomiting. 06/08/17  Yes Lawyer, Harrell Gave, PA-C  tamsulosin (FLOMAX) 0.4 MG CAPS capsule Take 2 capsules (0.8 mg total) by mouth daily after supper. 10/31/16  Yes Jeffery, Chelle, PA-C  traMADol (ULTRAM) 50 MG tablet TAKE TWO TABLETS BY MOUTH EVERY 6 HOURS AS NEEDED 07/20/17  Yes Jeffery, Chelle, PA-C  Vitamin D, Ergocalciferol, (DRISDOL) 50000 units CAPS capsule Take 1 capsule (50,000 Units total) by mouth every 7 (seven) days. 12/17/16  Yes Dennard Nip D, MD   Allergies  Allergen Reactions  . Ibuprofen Other (See Comments)    TOLD NOT TO TAKE-PER CARDS  . Iodinated Diagnostic Agents Hives    Hives started 4 days after cta chest was performed, minimal relief w/ benadryl x 3 days, uncertain if reaction was actually iv contrast or other allergen,allergy tests to follow.wife will make [place] aware of results//a.calhoun   Social History   Social History  . Marital status:  Married    Spouse name: Vipul Cafarelli  . Number of children: 0  . Years of education: 12th grade   Occupational History  . 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   Social History Main Topics  . Smoking status: Former Smoker    Packs/day: 1.00    Years: 13.00    Types: Cigarettes    Quit date: 01/23/1988  . Smokeless tobacco: Never Used  . Alcohol use Yes     Comment: quit 1989  . Drug use: No     Comment: previous cocaine and marijuana use, quit 1988  . Sexual activity: Not Currently    Partners: Female   Other Topics Concern  . Not on file   Social History Narrative   Married, lives in [place]. Therapist, occupational for [employer].    Pt is adopted. Unsure of his family medical history.      Objective:   Physical Exam  Constitutional: He is oriented to person, place, and time. He appears well-developed and well-nourished. No distress.  HENT:  Head: Normocephalic and atraumatic.  Right Ear: External ear normal.  Left Ear: External ear normal.  Neck: Neck supple. No JVD present. No tracheal deviation present. No thyromegaly present.  Cardiovascular: Normal rate, regular rhythm, normal heart sounds and intact distal pulses.  Exam reveals no gallop and no friction rub.   No murmur heard. Pulmonary/Chest: Effort normal and breath sounds normal. No stridor. No respiratory distress. He has no wheezes. He has no rales. He exhibits no tenderness.  Lymphadenopathy:    He has no cervical adenopathy.  Neurological: He is alert and oriented to person, place, and time.  Skin: Skin is warm and dry. No rash noted. He is not diaphoretic. No erythema. No pallor.  Psychiatric: He has a normal mood and affect. His behavior is normal. Judgment and thought content normal.       Assessment & Plan:  1. Bilateral primary osteoarthritis of knee - Patient will schedule appointment with Dr. Gladstone Lighter to consider conservative management of his pain. Continue  with heat and ice modalities.  - diclofenac sodium (VOLTAREN) 1 % GEL; Apply 2 g topically 4 (four) times daily as needed (PAIN).  Dispense: 100  g; Refill: prn  2. Situational mixed anxiety and depressive disorder - Initiate sertraline 1/2 tablets at 25mg  for 6 days, can increase dose if no change in symptoms - sertraline (ZOLOFT) 50 MG tablet; Take 1 tablet (50 mg total) by mouth daily.  Dispense: 30 tablet; Refill: 3  3. Bilateral plantar fasciitis - Instructed patient on stretches and icing the soles of the feet for pain relief.  4. OSA (obstructive sleep apnea) - Continue with nightly CPAP use  5. Essential hypertension - Controlled, continue with current treatment  6. Type 2 diabetes mellitus without complication, without long-term current use of insulin (HCC) - Controlled, continue with current treatment  7. BMI 36.0-36.9,adult - Has lost 6 pounds since his last office visit in August, due to lifestyle changes.   Follow up in 6 weeks, to re-evaluate mood after initiation of sertraline.   Respectfully, Denny Levy PA-S 2019

## 2017-08-06 ENCOUNTER — Telehealth: Payer: Self-pay

## 2017-08-06 NOTE — Telephone Encounter (Signed)
Per cover my meds, PA started for Diclofenac sodium 3% gel. Key is [id number]. Should have response within 72 business hours.

## 2017-08-10 NOTE — Telephone Encounter (Signed)
PA was denied. LMVM for patient to call me back regarding his medication and left the number to my direct line.

## 2017-08-19 NOTE — Telephone Encounter (Signed)
Tried to call patient but there was no answer.

## 2017-08-23 ENCOUNTER — Other Ambulatory Visit: Payer: Self-pay | Admitting: Physician Assistant

## 2017-08-23 DIAGNOSIS — G8929 Other chronic pain: Secondary | ICD-10-CM

## 2017-08-23 DIAGNOSIS — M25562 Pain in left knee: Principal | ICD-10-CM

## 2017-08-24 NOTE — Telephone Encounter (Signed)
Please advise 

## 2017-08-25 ENCOUNTER — Telehealth: Payer: Self-pay

## 2017-08-25 NOTE — Telephone Encounter (Signed)
Meds ordered this encounter  Medications  . traMADol (ULTRAM) 50 MG tablet    Sig: TAKE TWO TABLETS BY MOUTH EVERY 6 HOURS AS NEEDED    Dispense:  90 tablet    Refill:  0

## 2017-09-15 ENCOUNTER — Encounter: Payer: Self-pay | Admitting: Physician Assistant

## 2017-09-15 ENCOUNTER — Other Ambulatory Visit: Payer: Self-pay

## 2017-09-15 ENCOUNTER — Ambulatory Visit (INDEPENDENT_AMBULATORY_CARE_PROVIDER_SITE_OTHER): Payer: BLUE CROSS/BLUE SHIELD | Admitting: Physician Assistant

## 2017-09-15 VITALS — BP 160/86 | HR 71 | Temp 98.7°F | Resp 18 | Ht 75.0 in | Wt 293.4 lb

## 2017-09-15 DIAGNOSIS — R351 Nocturia: Secondary | ICD-10-CM

## 2017-09-15 DIAGNOSIS — I10 Essential (primary) hypertension: Secondary | ICD-10-CM

## 2017-09-15 DIAGNOSIS — F4323 Adjustment disorder with mixed anxiety and depressed mood: Secondary | ICD-10-CM | POA: Diagnosis not present

## 2017-09-15 DIAGNOSIS — N401 Enlarged prostate with lower urinary tract symptoms: Secondary | ICD-10-CM | POA: Diagnosis not present

## 2017-09-15 MED ORDER — TAMSULOSIN HCL 0.4 MG PO CAPS: 0.8000 mg | ORAL_CAPSULE | Freq: Every day | ORAL | 3 refills | Status: DC

## 2017-09-15 MED ORDER — SERTRALINE HCL 100 MG PO TABS: 100.0000 mg | ORAL_TABLET | Freq: Every day | ORAL | 3 refills | Status: DC

## 2017-09-15 NOTE — Assessment & Plan Note (Signed)
Not controlled today, likely due to the stress he is experiencing at work. Continue current regimen for HTN and work to reduce impact of stress.

## 2017-09-15 NOTE — Patient Instructions (Signed)
     IF you received an x-ray today, you will receive an invoice from [facility]. Please contact [facility] at [phone] with questions or concerns regarding your invoice.   IF you received labwork today, you will receive an invoice from [facility]. Please contact [facility] at [phone] with questions or concerns regarding your invoice.   Our billing staff will not be able to assist you with questions regarding bills from these companies.  You will be contacted with the lab results as soon as they are available. The fastest way to get your results is to activate your My Chart account. Instructions are located on the last page of this paperwork. If you have not heard from us regarding the results in 2 weeks, please contact this office.     

## 2017-09-15 NOTE — Progress Notes (Signed)
Patient ID: Jonathan Miles, male    DOB: 09-02-1961, 56 y.o.   MRN: [id number]  PCP: Harrison Mons, PA-C  Chief Complaint  Patient presents with  . Depression    Depression scale score 11    Subjective:   Presents for evaluation of mood.  At his visit 08/05/2017 for wellness exam, he revealed considerable work stress that was no longer managed by relaxation techniques and was beginning to impact his marriage. We started sertraline 50 mg, which he is tolerating well, though thinks it may cause some drowsiness. No other adverse effects.  Work stress has further increased. Got some praise from his manager last week, but is feeling the weight of the increased work. Often feels like he's doing a lot of work, "for nothing."  Lenox Ahr worked with his wife to help him organize his work day, but is an "organized chaos" situation. Difficult to prioritize all the high priority issues.  He thinks that the sertraline has helped his attitude a little, thinks he's been easier to live with. Doesn't think he's had any "outbursts or been nasty." Some days just feels like he's ready to quit his job.  Plans to have the cholecystectomy after the first of the year. Had a GB attack this past weekend, not as bad as before. Didn't have to go to the ED. Took his promethazine and tramadol.   Review of Systems As above.  Depression screen [facility] 2/9 09/15/2017 08/05/2017 08/05/2017 06/13/2017 12/03/2016  Decreased Interest 1 1 0 0 1  Down, Depressed, Hopeless 1 1 0 0 1  PHQ - 2 Score 2 2 0 0 2  Altered sleeping 0 1 - - 2  Tired, decreased energy 3 3 - - 2  Change in appetite 1 1 - - 1  Feeling bad or failure about yourself  1 0 - - 1  Trouble concentrating 2 1 - - 2  Moving slowly or fidgety/restless 2 1 - - 1  Suicidal thoughts 0 0 - - 0  PHQ-9 Score 11 9 - - 11  Difficult doing work/chores Somewhat difficult Somewhat difficult - - -     Patient Active Problem List   Diagnosis Date Noted  .  BMI 36.0-36.9,adult 08/05/2017  . Cholelithiasis 08/05/2017  . Bilateral plantar fasciitis 08/05/2017  . Situational mixed anxiety and depressive disorder 08/05/2017  . Bilateral primary osteoarthritis of knee 07/01/2017  . Asthma 01/26/2017  . Overactive bladder 01/21/2017  . BPH with obstruction/lower urinary tract symptoms 01/21/2017  . S/P appendectomy 01/07/2017  . Diverticulosis 11/24/2016  . Type 2 diabetes mellitus without complication, without long-term current use of insulin ([place]) 10/31/2016  . Aortic insufficiency 11/23/2012  . Chronic diastolic CHF (congestive heart failure) ([place]) 11/23/2012  . CAD (coronary artery disease) 11/27/2011  . OSA (obstructive sleep apnea) 11/11/2011  . Aortic root dilatation ([place])   . Hyperlipidemia 01/23/2011  . Hypertension 01/23/2011     Prior to Admission medications   Medication Sig Start Date End Date Taking? Authorizing Provider  acetaminophen (TYLENOL) 500 MG tablet TAKE 1-2 TABS EVERY 6 HOURS AS NEEDED FOR PAIN Patient taking differently: Take 500-1,000 mg by mouth every 6 (six) hours as needed for mild pain, moderate pain, fever or headache.  05/26/12   Richardson Dopp T, PA-C  aspirin EC 81 MG tablet Take 81 mg by mouth at bedtime.    [provider]  atorvastatin (LIPITOR) 40 MG tablet Take 1 tablet (40 mg total) by mouth daily. Patient taking differently:  Take 40 mg by mouth at bedtime.  10/31/16   Harrison Mons, PA-C  carvedilol (COREG) 12.5 MG tablet Take 1 tablet (12.5 mg total) by mouth 2 (two) times daily. 10/31/16   Harrison Mons, PA-C  diclofenac sodium (VOLTAREN) 1 % GEL Apply 2 g topically 4 (four) times daily as needed (PAIN). 08/05/17   Harrison Mons, PA-C  felodipine (PLENDIL) 10 MG 24 hr tablet Take 1 tablet (10 mg total) by mouth daily. Patient taking differently: Take 10 mg by mouth at bedtime.  10/31/16   Harrison Mons, PA-C  furosemide (LASIX) 40 MG tablet Take 1 tablet (40 mg total) by mouth  daily. Patient taking differently: Take 40 mg by mouth daily with breakfast.  10/31/16   Harrison Mons, PA-C  HYDROcodone-acetaminophen (NORCO/VICODIN) 5-325 MG tablet Take 1 tablet by mouth every 6 (six) hours as needed for moderate pain. 06/08/17   Lawyer, Harrell Gave, PA-C  lisinopril (PRINIVIL,ZESTRIL) 20 MG tablet Take 1 tablet (20 mg total) by mouth daily. Patient taking differently: Take 20 mg by mouth daily with breakfast.  10/31/16   Harrison Mons, PA-C  loratadine (CLARITIN) 10 MG tablet Take 10 mg by mouth at bedtime.     [provider]  metFORMIN (GLUCOPHAGE) 1000 MG tablet 1 bid Patient taking differently: Take 1,000 mg by mouth 2 (two) times daily with a meal.  11/01/16   Lashawndra Lampkins, PA-C  mirabegron ER (MYRBETRIQ) 50 MG TB24 tablet Take 50 mg by mouth at bedtime.     [provider]  oxyCODONE (OXY IR/ROXICODONE) 5 MG immediate release tablet Take 1-2 tablets (5-10 mg total) by mouth every 4 (four) hours as needed for moderate pain. 01/07/17   Earnstine Regal, PA-C  promethazine (PHENERGAN) 25 MG tablet Take 1 tablet (25 mg total) by mouth every 6 (six) hours as needed for nausea or vomiting. 06/08/17   Lawyer, Harrell Gave, PA-C  sertraline (ZOLOFT) 50 MG tablet Take 1 tablet (50 mg total) by mouth daily. 08/05/17   Harrison Mons, PA-C  tamsulosin (FLOMAX) 0.4 MG CAPS capsule Take 2 capsules (0.8 mg total) by mouth daily after supper. 10/31/16   Harrison Mons, PA-C  traMADol (ULTRAM) 50 MG tablet TAKE TWO TABLETS BY MOUTH EVERY 6 HOURS AS NEEDED 08/25/17   Harrison Mons, PA-C  Vitamin D, Ergocalciferol, (DRISDOL) 50000 units CAPS capsule Take 1 capsule (50,000 Units total) by mouth every 7 (seven) days. 12/17/16   Starlyn Skeans, MD     Allergies  Allergen Reactions  . Ibuprofen Other (See Comments)    TOLD NOT TO TAKE-PER CARDS  . Iodinated Diagnostic Agents Hives    Hives started 4 days after cta chest was performed, minimal relief w/ benadryl x 3  days, uncertain if reaction was actually iv contrast or other allergen,allergy tests to follow.wife will make [place] aware of results//a.[email or web address]       Objective:  Physical Exam  Constitutional: He is oriented to person, place, and time. He appears well-developed and well-nourished. He is active and cooperative. No distress.  BP (!) 160/86   Pulse 71   Temp 98.7 F (37.1 C) (Oral)   Resp 18   Ht 6\' 3"  (1.905 m)   Wt 293 lb 6.4 oz (133.1 kg)   SpO2 97%   BMI 36.67 kg/m   HENT:  Head: Normocephalic and atraumatic.  Right Ear: Hearing normal.  Left Ear: Hearing normal.  Eyes: Conjunctivae are normal. No scleral icterus.  Neck: Normal range of motion. Neck supple. No thyromegaly present.  Cardiovascular: Normal rate, regular rhythm and normal heart sounds.  Pulses:      Radial pulses are 2+ on the right side, and 2+ on the left side.  Pulmonary/Chest: Effort normal and breath sounds normal.  Lymphadenopathy:       Head (right side): No tonsillar, no preauricular, no posterior auricular and no occipital adenopathy present.       Head (left side): No tonsillar, no preauricular, no posterior auricular and no occipital adenopathy present.    He has no cervical adenopathy.       Right: No supraclavicular adenopathy present.       Left: No supraclavicular adenopathy present.  Neurological: He is alert and oriented to person, place, and time. No sensory deficit.  Skin: Skin is warm, dry and intact. No rash noted. No cyanosis or erythema. Nails show no clubbing.  Psychiatric: He has a normal mood and affect. His speech is normal and behavior is normal.    BP Readings from Last 3 Encounters:  09/15/17 (!) 160/86  08/05/17 128/80  06/13/17 (!) 153/87       Assessment & Plan:   Problem List Items Addressed This Visit    Hypertension - Primary    Not controlled today, likely due to the stress he is experiencing at work. Continue current regimen for HTN and work to reduce impact of stress.        Situational mixed anxiety and depressive disorder    Minimal improvement. INCREASE sertraline from 50 mg to 100 mg.      Relevant Medications   sertraline (ZOLOFT) 100 MG tablet    Other Visit Diagnoses    Benign prostatic hyperplasia with nocturia       Relevant Medications   tamsulosin (FLOMAX) 0.4 MG CAPS capsule       Return in about 4 weeks (around 10/13/2017) for re-evaluation of mood.   Fara Chute, PA-C [facility] [place]

## 2017-09-15 NOTE — Assessment & Plan Note (Signed)
Minimal improvement. INCREASE sertraline from 50 mg to 100 mg.

## 2017-09-22 ENCOUNTER — Other Ambulatory Visit: Payer: Self-pay | Admitting: Physician Assistant

## 2017-09-22 DIAGNOSIS — G8929 Other chronic pain: Secondary | ICD-10-CM

## 2017-09-22 DIAGNOSIS — M25562 Pain in left knee: Principal | ICD-10-CM

## 2017-09-23 NOTE — Telephone Encounter (Signed)
Patient notified via My Chart. Rx sent electronically.  Meds ordered this encounter  Medications  . traMADol (ULTRAM) 50 MG tablet    Sig: TAKE TWO TABLETS BY MOUTH EVERY 6 HOURS AS NEEDED    Dispense:  90 tablet    Refill:  0

## 2017-09-23 NOTE — Telephone Encounter (Signed)
Controlled substance 

## 2017-10-15 ENCOUNTER — Encounter: Payer: BLUE CROSS/BLUE SHIELD | Admitting: Cardiothoracic Surgery

## 2017-10-16 ENCOUNTER — Ambulatory Visit: Payer: BLUE CROSS/BLUE SHIELD | Admitting: Physician Assistant

## 2017-10-18 ENCOUNTER — Other Ambulatory Visit: Payer: Self-pay | Admitting: Physician Assistant

## 2017-10-18 DIAGNOSIS — G8929 Other chronic pain: Secondary | ICD-10-CM

## 2017-10-18 DIAGNOSIS — M25562 Pain in left knee: Principal | ICD-10-CM

## 2017-10-23 ENCOUNTER — Encounter: Payer: Self-pay | Admitting: Cardiothoracic Surgery

## 2017-10-23 ENCOUNTER — Ambulatory Visit (INDEPENDENT_AMBULATORY_CARE_PROVIDER_SITE_OTHER): Payer: BLUE CROSS/BLUE SHIELD | Admitting: Cardiothoracic Surgery

## 2017-10-23 ENCOUNTER — Other Ambulatory Visit: Payer: Self-pay

## 2017-10-23 VITALS — BP 149/89 | HR 66 | Resp 18 | Ht 75.0 in | Wt 299.8 lb

## 2017-10-23 DIAGNOSIS — I77810 Thoracic aortic ectasia: Secondary | ICD-10-CM | POA: Diagnosis not present

## 2017-10-23 NOTE — Telephone Encounter (Signed)
Error-Sign Encounter 

## 2017-10-23 NOTE — Progress Notes (Signed)
[address]       [place] [address]             [phone]                                        Jonathan Miles [place] Medical Record #[id number] Date of Birth: June 28, 1961  Dr  Keturah Barre Jessica Priest, PA-C Primary Cardiology:No primary care provider on file. No primary care provider on file.   Chief Complaint:   PostOp Follow Up Visit 05/07/2012  OPERATIVE REPORT  PREOPERATIVE DIAGNOSIS: Dilated aortic root.  POSTOPERATIVE DIAGNOSIS: Dilated aortic root.  SURGICAL PROCEDURE: Valve-sparing aortic root replacement and coronary  artery bypass grafting x1 with second reverse saphenous vein graft to  the distal right coronary artery with right thigh endo vein harvesting.    History of Present Illness:      Patient stable from cardiac standpoint. He reports since last seen had emergency appendectomy, and now needs gall bladder out.     Social History   Tobacco Use  Smoking Status Former Smoker  . Packs/day: 1.00  . Years: 13.00  . Pack years: 13.00  . Types: Cigarettes  . Last attempt to quit: 01/23/1988  . Years since quitting: 29.7  Smokeless Tobacco Never Used       Allergies  Allergen Reactions  . Ibuprofen Other (See Comments)    TOLD NOT TO TAKE-PER CARDS  . Iodinated Diagnostic Agents Hives    Hives started 4 days after cta chest was performed, minimal relief w/ benadryl x 3 days, uncertain if reaction was actually iv contrast or other allergen,allergy tests to follow.wife will make [place] aware of results//a.[email or web address]    Current Outpatient Medications  Medication Sig Dispense Refill  . acetaminophen (TYLENOL) 500 MG tablet TAKE 1-2 TABS EVERY 6 HOURS AS NEEDED FOR PAIN (Patient taking differently: Take 500-1,000 mg by mouth every 6 (six) hours as needed for mild pain, moderate pain, fever or headache. )    . aspirin EC 81 MG tablet Take 81 mg by mouth at bedtime.    Marland Kitchen atorvastatin (LIPITOR) 40 MG tablet Take 1 tablet (40 mg  total) by mouth daily. (Patient taking differently: Take 40 mg by mouth at bedtime. ) 90 tablet 3  . carvedilol (COREG) 12.5 MG tablet Take 1 tablet (12.5 mg total) by mouth 2 (two) times daily. 180 tablet 3  . diclofenac sodium (VOLTAREN) 1 % GEL Apply 2 g topically 4 (four) times daily as needed (PAIN). 100 g prn  . felodipine (PLENDIL) 10 MG 24 hr tablet Take 1 tablet (10 mg total) by mouth daily. (Patient taking differently: Take 10 mg by mouth at bedtime. ) 90 tablet 3  . furosemide (LASIX) 40 MG tablet Take 1 tablet (40 mg total) by mouth daily. (Patient taking differently: Take 40 mg by mouth daily with breakfast. ) 90 tablet 3  . lisinopril (PRINIVIL,ZESTRIL) 20 MG tablet Take 1 tablet (20 mg total) by mouth daily. (Patient taking differently: Take 20 mg by mouth daily with breakfast. ) 90 tablet 3  . loratadine (CLARITIN) 10 MG tablet Take 10 mg by mouth at bedtime.     . metFORMIN (GLUCOPHAGE) 1000 MG tablet 1 bid (Patient taking differently: Take 1,000 mg by mouth 2 (two) times daily with a meal. ) 180 tablet 3  . mirabegron ER (MYRBETRIQ) 50 MG  TB24 tablet Take 50 mg by mouth at bedtime.     . promethazine (PHENERGAN) 25 MG tablet Take 1 tablet (25 mg total) by mouth every 6 (six) hours as needed for nausea or vomiting. 10 tablet 0  . sertraline (ZOLOFT) 100 MG tablet Take 1 tablet (100 mg total) by mouth daily. 90 tablet 3  . tamsulosin (FLOMAX) 0.4 MG CAPS capsule Take 2 capsules (0.8 mg total) by mouth daily after supper. 180 capsule 3  . traMADol (ULTRAM) 50 MG tablet TAKE TWO TABLETS BY MOUTH EVERY 6 HOURS AS NEEDED 90 tablet 0   No current facility-administered medications for this visit.        Physical Exam: BP (!) 149/89 (BP Location: Left Arm, Patient Position: Sitting, Cuff Size: Large)   Pulse 66   Resp 18   Ht 6\' 3"  (1.905 m)   Wt 299 lb 12.8 oz (136 kg)   SpO2 97% Comment: on RA  BMI 37.47 kg/m   General appearance: alert, cooperative and no distress Head:  Normocephalic, without obvious abnormality, atraumatic Neck: no adenopathy, no carotid bruit, no JVD, supple, symmetrical, trachea midline and thyroid not enlarged, symmetric, no tenderness/mass/nodules Lymph nodes: Cervical, supraclavicular, and axillary nodes normal. Resp: clear to auscultation bilaterally Back: symmetric, no curvature. ROM normal. No CVA tenderness. Cardio: regular rate and rhythm, S1, S2 normal, no murmur, click, rub or gallop GI: soft, non-tender; bowel sounds normal; no masses,  no organomegaly Extremities: extremities normal, atraumatic, no cyanosis or edema and Homans sign is negative, no sign of DVT Neurologic: Grossly normal Keloids of sternal incision unchanged   Diagnostic Studies & Laboratory data:          Recent Radiology Findings:   Ct Angio Chest Aorta W/cm &/or Wo/cm  10/15/2015  CLINICAL DATA:  Status post aortic root replacement and coronary bypass EXAM: CT ANGIOGRAPHY CHEST WITH CONTRAST TECHNIQUE: Multidetector CT imaging of the chest was performed using the standard protocol during bolus administration of intravenous contrast. Multiplanar CT image reconstructions and MIPs were obtained to evaluate the vascular anatomy. CONTRAST:  75 cc Isovue 370 COMPARISON:  06/17/2013 FINDINGS: Mediastinum/Lymph Nodes: Patient is status post median sternotomy. Prior coronary bypass and aortic root repair noted. No aneurysm, dissection, mediastinal hemorrhage, or hematoma. Stable ectasia of the coronary origins, as before. Visualized central pulmonary arteries appear patent. No significant proximal filling defect or pulmonary embolus demonstrated. Native coronary atherosclerosis noted. Normal heart size. No pericardial or pleural effusion. No adenopathy. Lungs/Pleura: Lungs remain clear. No focal pneumonia, collapse or consolidation. Stable calcified left lower lobe granuloma and left hilar lymph node. No interstitial process or edema. No pneumothorax. Trachea and central  airways are patent. Upper abdomen: Splenic punctate calcified granulomas evident. No acute upper abdominal finding. Colonic diverticulosis noted. Musculoskeletal: Minor degenerative endplate changes of the thoracic spine. No compression fracture. Prior median sternotomy. No acute osseous finding. Review of the MIP images confirms the above findings. IMPRESSION: Stable postoperative changes of the thoracic aorta. No acute intra thoracic process or interval change. Remote granulomatous disease. Electronically Signed   By: Jerilynn Mages.  Shick M.D.   On: 10/15/2015 10:03   Ct Angio Chest Aorta W/cm &/or Wo/cm  06/17/2013   *RADIOLOGY REPORT*  Clinical Data: Follow-up status post aortic root replacement  CT ANGIOGRAPHY CHEST  Technique:  Multidetector CT imaging of the chest using the standard protocol during bolus administration of intravenous contrast. Multiplanar reconstructed images including MIPs were obtained and reviewed to evaluate the vascular anatomy.  Contrast: 25mL OMNIPAQUE  IOHEXOL 350 MG/ML SOLN  Comparison: Most recent prior CTA of the chest 09/25/2011; most recent prior chest x-ray 10/07/2012  Findings:  Mediastinum: Unremarkable CT appearance of the thyroid gland.  No suspicious mediastinal or hilar adenopathy.  No soft tissue mediastinal mass.  The thoracic esophagus is unremarkable.  Heart/Vascular:  Status post median sternotomy with evidence of valve sparing tube graft replacement of the ascending aorta and the aortic to RCA saphenous vein graft.  The vein graft opacifies with contrast material.  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  This is unchanged compared to prior. The aortic anastomosis is intact.  No evidence of periaortic fluid, or hematoma.  The heart is within normal limits for size.  No pericardial effusion.  Atherosclerotic vascular calcifications noted in the left anterior descending coronary artery.  Lungs/Pleura: Stable calcified granuloma in the  inferior left lower lobe.  The lungs are otherwise clear.  Upper Abdomen: Scattered punctate calcifications throughout the spleen consistent with the sequela of remote granulomatous disease. Otherwise, visualization of the upper abdomen is unremarkable.  Bones: No acute fracture or aggressive appearing lytic or blastic osseous lesion.  Healed median sternotomy.  Sternotomy wires are intact.  IMPRESSION:  1. Compared to the prior chest CTA from November 2012 there are interval surgical changes of repair of the ascending aorta with a straight tube graft as well as single vessel CABG to the right coronary artery.  No evidence of complication including recurrent aneurysmal dilatation, dissection, leak or mediastinal hematoma. The visualized portion of the aortic to RCA saphenous vein graft opacifies with contrast material.  2.  Sequela of remote granulomatous disease.   Original Report Authenticated By: Jacqulynn Cadet, M.D.       Result status: Final result                              *[facility]*                         [address],  [address]                            [phone]  ------------------------------------------------------------------- Echocardiography  Patient:    Astor, Gentle MR #:       [id number] Study Date: 12/24/2016 Gender:     M Age:        48 Height:     190.5 cm Weight:     147.4 kg BSA:        2.85 m^2 Pt. Status: Room:   Elenore Paddy, M.D.  REFERRING    Loralie Champagne, M.D.  ATTENDING    Jacqulynn Cadet, [place], [place], Outpatient  cc:  ------------------------------------------------------------------- LV EF: 60% -   65%  ------------------------------------------------------------------- Indications:      CHF -  428.0.  ------------------------------------------------------------------- History:   PMH:   Coronary artery disease.  Risk factors: Hypertension.  ------------------------------------------------------------------- Study Conclusions  - Left ventricle: The cavity size  was normal. Wall thickness was   increased in a pattern of moderate LVH. Systolic function was   normal. The estimated ejection fraction was in the range of 60%   to 65%. Features are consistent with a pseudonormal left   ventricular filling pattern, with concomitant abnormal relaxation   and increased filling pressure (grade 2 diastolic dysfunction). - Aortic valve: There was mild regurgitation. - Left atrium: The atrium was mildly dilated.  ------------------------------------------------------------------- Labs, prior tests, procedures, and surgery: Aortic root/ascending aortic valve-sparing replacement and CABG 05/07/12.  ------------------------------------------------------------------- Study data:  Comparison was made to the study of 12/14/2015.  Study status:  Routine.  Procedure:  The patient reported no pain pre or post test. Transthoracic echocardiography. Image quality was adequate.  Study completion:  There were no complications. Echocardiography.  M-mode, complete 2D, spectral Doppler, and color Doppler.  Birthdate:  Patient birthdate: 11-29-1960.  Age:  Patient is 56 yr old.  Sex:  Gender: male.    BMI: 40.6 kg/m^2.  Blood pressure:     129/74  Patient status:  Outpatient.  Study date: Study date: 12/24/2016. Study time: 03:31 PM.  Location:  Echo laboratory.  -------------------------------------------------------------------  ------------------------------------------------------------------- Left ventricle:  Global longitudinal strain is -16.5%.  The cavity size was normal. Wall thickness was increased in a pattern of moderate LVH. Systolic function was normal. The estimated ejection  fraction was in the range of 60% to 65%. Features are consistent with a pseudonormal left ventricular filling pattern, with concomitant abnormal relaxation and increased filling pressure (grade 2 diastolic dysfunction).  ------------------------------------------------------------------- Aortic valve:   Structurally normal valve.   Cusp separation was normal.  Doppler:  Transvalvular velocity was within the normal range. There was no stenosis. There was mild regurgitation.  ------------------------------------------------------------------- Aorta:  Aortic root: The aortic root was normal in size. Ascending aorta: The ascending aorta was mildly dilated.  ------------------------------------------------------------------- Mitral valve:   Structurally normal valve.   Leaflet separation was normal.  Doppler:  Transvalvular velocity was within the normal range. There was no evidence for stenosis. There was trivial regurgitation.    Peak gradient (D): 3 mm Hg.  ------------------------------------------------------------------- Left atrium:  The atrium was mildly dilated.  ------------------------------------------------------------------- Right ventricle:  The cavity size was normal. Systolic function was normal.  ------------------------------------------------------------------- Pulmonic valve:    The valve appears to be grossly normal. Doppler:  There was no significant regurgitation.  ------------------------------------------------------------------- Tricuspid valve:   The valve appears to be grossly normal. Doppler:  There was trivial regurgitation.  ------------------------------------------------------------------- Right atrium:  The atrium was normal in size.  ------------------------------------------------------------------- Pericardium:  There was no pericardial effusion.  ------------------------------------------------------------------- Measurements    Left ventricle                        Value        Reference  LV ID, ED, PLAX chordal               48.8  mm     43 - 52  LV ID, ES, PLAX chordal               34.4  mm     23 - 38  LV fx shortening, PLAX chordal        30    %      >=29  LV PW thickness, ED                   13.2  mm     ----------  IVS/LV PW ratio, ED                   1.08         <=1.3  Stroke volume, 2D                     107   ml     ----------  Stroke volume/bsa, 2D                 38    ml/m^2 ----------  LV e&', lateral                        7.18  cm/s   ----------  LV E/e&', lateral                      11.42        ----------  LV e&', medial                         5.22  cm/s   ----------  LV E/e&', medial                       15.71        ----------  LV e&', average                        6.2   cm/s   ----------  LV E/e&', average                      13.23        ----------  Longitudinal strain, TDI              17    %      ----------    Ventricular septum                    Value        Reference  IVS thickness, ED                     14.2  mm     ----------    LVOT                                  Value        Reference  LVOT ID, S                            25    mm     ----------  LVOT area                             4.91  cm^2   ----------  LVOT peak velocity, S                 99    cm/s   ----------  LVOT mean velocity, S                 66.2  cm/s   ----------  LVOT VTI, S                           21.8  cm     ----------  LVOT peak gradient, S                 4     mm Hg  ----------    Aorta                                 Value        Reference  Aortic root ID, ED                    37    mm     ----------    Left atrium                           Value        Reference  LA ID, A-P, ES                        54    mm     ----------  LA ID/bsa, A-P                        1.89  cm/m^2 <=2.2  LA volume, S                          96.5  ml     ----------  LA volume/bsa, S                       33.8  ml/m^2 ----------  LA volume, ES, 1-p A4C                121   ml     ----------  LA volume/bsa, ES, 1-p A4C            42.4  ml/m^2 ----------  LA volume, ES, 1-p A2C                75.9  ml     ----------  LA volume/bsa, ES, 1-p A2C            26.6  ml/m^2 ----------    Mitral valve                          Value        Reference  Mitral E-wave peak velocity           82    cm/s   ----------  Mitral A-wave peak velocity           73.7  cm/s   ----------  Mitral deceleration time       (H)    257   ms     150 - 230  Mitral peak gradient, D               3     mm Hg  ----------  Mitral E/A ratio, peak                1.1          ----------    Tricuspid valve                       Value        Reference  Tricuspid regurg peak velocity  277   cm/s   ----------  Tricuspid peak RV-RA gradient         31    mm Hg  ----------    Right atrium                          Value        Reference  RA ID, S-I, ES, A4C            (H)    66.1  mm     34 - 49  RA area, ES, A4C               (H)    27.3  cm^2   8.3 - 19.5  RA volume, ES, A/L                    92.5  ml     ----------  RA volume/bsa, ES, A/L                32.4  ml/m^2 ----------    Right ventricle                       Value        Reference  TAPSE                                 19.5  mm     ----------  RV s&', lateral, S                     9.9   cm/s   ----------  Legend: (L)  and  (H)  mark values outside specified reference range.  ------------------------------------------------------------------- Prepared and Electronically Authenticated by  Mertie Moores, M.D. 2018-02-28T17:31:40     Recent Labs: Lab Results  Component Value Date   WBC 11.7 (H) 06/08/2017   HGB 14.2 06/08/2017   HCT 40.3 06/08/2017   PLT 260 06/08/2017   GLUCOSE 148 (H) 06/08/2017   CHOL 160 06/13/2017   TRIG 75 06/13/2017   HDL 40 06/13/2017   LDLCALC 105 (H) 06/13/2017   ALT 17 06/08/2017   AST 14 (L) 06/08/2017   NA 139  06/08/2017   K 3.5 06/08/2017   CL 105 06/08/2017   CREATININE 0.76 06/08/2017   BUN 11 06/08/2017   CO2 24 06/08/2017   TSH 1.390 10/31/2016   INR 1.54 (H) 05/07/2012   HGBA1C 5.7 (H) 06/13/2017      Assessment / Plan: Patient stable from cardiac standpoint most recent echo 11/2016 mild ai , with EF 60 %  Plan to see back in one year with follow up cta of chest  Follow up echo per cardiology schedule        Grace Isaac MD 10/23/2017 3:17 PM

## 2017-10-26 ENCOUNTER — Telehealth: Payer: Self-pay | Admitting: *Deleted

## 2017-10-26 NOTE — Telephone Encounter (Signed)
-----   Message from Liliane Shi, [place] sent at 10/26/2017  7:01 AM EST ----- Arbie Cookey - See message from Dr. Servando Snare. Please make sure Jonathan Miles gets a follow up appointment with me in the next month or so. Thanks, Event organiser  ----- Message ----- From: Grace Isaac, MD Sent: 10/25/2017   8:40 PM To: Liliane Shi, PA-C  Please see this recent information about our joint patient. Scott this patient wants to come back to see you, does not want to go back to HF clinic, says it cost too much   Edward B.  Gerhardt MD [facility] [place].[address]  ,[place] [address]        [phone] office        [phone] beeper

## 2017-10-26 NOTE — Telephone Encounter (Signed)
Left message per Dr. Servando Snare and Richardson Dopp, PA to schedule an appt with Richardson Dopp, PA to be seen in about 1 month or so.

## 2017-10-28 NOTE — Telephone Encounter (Signed)
Left message x 2 to schedule an appt with Richardson Dopp, PA. See previous note.

## 2017-10-30 NOTE — Telephone Encounter (Signed)
See previous notes. I saw pt has been scheduled to see Brynda Rim. PA on 11/20/17 @ 2:45. However, PA only works 1/2 days on Fridays in the AM. I called and lmom for the pt and apologized for the inconvenience and that we will need to reschedule his appt. He can stay on 11/20/17 if he would like though would need to be during the correct AM schedule times, or if he needs afternoon appt then will need to go to another day. I s/w [employer] [employer], scheduler for [employer], [place] and explained that one of the new schedulers scheduled pt at wrong time though the schedule was wide open when it should had been templated for PA 1/2 day schedule. I again on my vm apologized for any inconvenience this may cause him.

## 2017-10-30 NOTE — Progress Notes (Signed)
Pt has been scheduled to see Richardson Dopp, [facility] 11/20/17 @ 9:15

## 2017-11-02 ENCOUNTER — Other Ambulatory Visit: Payer: Self-pay | Admitting: Physician Assistant

## 2017-11-02 DIAGNOSIS — I10 Essential (primary) hypertension: Secondary | ICD-10-CM

## 2017-11-20 ENCOUNTER — Ambulatory Visit: Payer: BLUE CROSS/BLUE SHIELD | Admitting: Physician Assistant

## 2017-11-20 ENCOUNTER — Encounter: Payer: Self-pay | Admitting: Physician Assistant

## 2017-11-20 ENCOUNTER — Telehealth: Payer: Self-pay | Admitting: *Deleted

## 2017-11-20 ENCOUNTER — Other Ambulatory Visit: Payer: Self-pay | Admitting: Physician Assistant

## 2017-11-20 VITALS — BP 148/82 | HR 64 | Ht 75.0 in | Wt 300.2 lb

## 2017-11-20 DIAGNOSIS — E785 Hyperlipidemia, unspecified: Secondary | ICD-10-CM

## 2017-11-20 DIAGNOSIS — Z95828 Presence of other vascular implants and grafts: Secondary | ICD-10-CM | POA: Diagnosis not present

## 2017-11-20 DIAGNOSIS — I5032 Chronic diastolic (congestive) heart failure: Secondary | ICD-10-CM

## 2017-11-20 DIAGNOSIS — I351 Nonrheumatic aortic (valve) insufficiency: Secondary | ICD-10-CM | POA: Diagnosis not present

## 2017-11-20 DIAGNOSIS — I2510 Atherosclerotic heart disease of native coronary artery without angina pectoris: Secondary | ICD-10-CM | POA: Diagnosis not present

## 2017-11-20 DIAGNOSIS — M79605 Pain in left leg: Secondary | ICD-10-CM

## 2017-11-20 DIAGNOSIS — I10 Essential (primary) hypertension: Secondary | ICD-10-CM | POA: Diagnosis not present

## 2017-11-20 DIAGNOSIS — M79604 Pain in right leg: Secondary | ICD-10-CM | POA: Diagnosis not present

## 2017-11-20 LAB — COMPREHENSIVE METABOLIC PANEL
ALT: 12 IU/L (ref 0–44)
AST: 10 IU/L (ref 0–40)
Albumin/Globulin Ratio: 2.1 (ref 1.2–2.2)
Albumin: 4.4 g/dL (ref 3.5–5.5)
Alkaline Phosphatase: 79 IU/L (ref 39–117)
BUN/Creatinine Ratio: 18 (ref 9–20)
BUN: 13 mg/dL (ref 6–24)
Bilirubin Total: 0.6 mg/dL (ref 0.0–1.2)
CO2: 27 mmol/L (ref 20–29)
Calcium: 9.4 mg/dL (ref 8.7–10.2)
Chloride: 101 mmol/L (ref 96–106)
Creatinine, Ser: 0.74 mg/dL — ABNORMAL LOW (ref 0.76–1.27)
GFR calc Af Amer: 118 mL/min/{1.73_m2} (ref >59)
GFR calc non Af Amer: 102 mL/min/{1.73_m2} (ref >59)
Globulin, Total: 2.1 g/dL (ref 1.5–4.5)
Glucose: 95 mg/dL (ref 65–99)
Potassium: 3.8 mmol/L (ref 3.5–5.2)
Sodium: 142 mmol/L (ref 134–144)
Total Protein: 6.5 g/dL (ref 6.0–8.5)

## 2017-11-20 LAB — LIPID PANEL
Chol/HDL Ratio: 3 ratio (ref 0.0–5.0)
Cholesterol, Total: 140 mg/dL (ref 100–199)
HDL: 46 mg/dL (ref >39)
LDL Calculated: 82 mg/dL (ref 0–99)
Triglycerides: 59 mg/dL (ref 0–149)
VLDL Cholesterol Cal: 12 mg/dL (ref 5–40)

## 2017-11-20 NOTE — Telephone Encounter (Signed)
Pt has been notified of lab results by phone with verbal understanding. Pt thanked me for the good news on his lab results.

## 2017-11-20 NOTE — Progress Notes (Signed)
Cardiology Office Note:    Date:  11/20/2017   ID:  Jonathan Miles, DOB 1961-04-17, MRN [id number]  PCP:  Harrison Mons, PA-C  Cardiologist:  Nelva Bush, MD   Referring MD: Harrison Mons, PA-C   Chief Complaint  Patient presents with  . Follow-up    CAD, aortic insufficiency    History of Present Illness:    Jonathan Miles is a 57 y.o. male with a hx of aortic root aneurysm and CAD, s/p aortic valve sparing aortic root replacement with CABG x1 (SVG-RCA) on 05/07/12, DM2, HTN, HL, diastolic CHF, sleep apnea. Last seen by Dr. Aundra Dubin in 10/2016 in the heart failure clinic.  Follow-up echo February 2018 demonstrated moderate diastolic dysfunction, normal LV function, mild aortic insufficiency and aortic root 37 mm.  He followed up with Dr. Servando Snare in December 2018 and requested follow-up in our clinic due to the cost of seeing Dr. Aundra Dubin in the heart failure clinic.  [name] returns for follow-up.  He is here alone.  He had emergency appendectomy early last year.  He has had some gallstones and has contemplated cholecystectomy.  However, he has decided to hold off for now.  He denies chest discomfort.  He has occasional shortness of breath.  He has been doing well with losing weight.  He has lost about 40 pounds since last year.  He denies PND, edema, syncope.  He does note bilateral leg pain at times with exertion and is concerned about PAD.  He has been doing well with CPAP.  Prior CV studies:   The following studies were reviewed today:  Echo 12/24/16 Moderate LVH, EF 34-19, grade 2 diastolic dysfunction, mild AI, mild LAE aortic root 37 mm  Echo 12/14/15 Moderate focal basal and mild concentric LVH, EF 60-65, normal wall motion, grade 2 diastolic dysfunction, mild to moderate AI, mildly dilated ascending aorta (44 mm), mild MR, moderate LAE, mild RAE, PASP 37  Echo 12/11/14 Moderate LVH, EF 55-60%, mild to moderate AI directed eccentrically in the LVOT and towards the  mitral anterior leaflet, aortic root 38 mm, ascending aorta 43 mm, moderate LAE, mild RAE  Echocardiogram 07/2013 Mild LVH, EF 60, diastolic dysfunction, mild AI, aortic root 41 mm, a sending aorta 33 mm, mild to moderate LAE, mildly reduced RVSF, mild RAE, PASP 31   Past Medical History:  Diagnosis Date  . Alcohol abuse   . Aortic root dilatation (HCC)    a. echo 4/12: EF 55-60%, mod to marked dilated ascending aortic root;   b. MRA 4/12: Aortic root 5.5 cm, dilation of left and right coronary cusps, trileaflet aortic valve  . Arthritis   . Asthma    ast attack in early 20's  . CAD (coronary artery disease)   . Chest pain    Myoview 4/12: EF 62%, no ischemia or scar  . CHF (congestive heart failure) ([place])   . Chronic pain    back and hands  . Constipation   . COPD (chronic obstructive pulmonary disease) ([place])   . Depression   . Diabetes mellitus    type 2  . Drug use   . Enlarged prostate   . Fatigue   . GERD (gastroesophageal reflux disease)    modifies with diet  . Hx of echocardiogram    Echo (2/16):  Mod LVH, EF 55-60%, mild to mod AI, mod LAE, mild RAE, Aortic root 38 mm, Asc aorta 43 mm  . Hyperlipidemia   . Hypertension   . Joint pain   .  Knee pain, bilateral   . OSA (obstructive sleep apnea) 2006   .  Wears at times  . Osteoarthritis   . Palpitations   . PONV (postoperative nausea and vomiting)   . SOB (shortness of breath)   Past Medical History:  1. DM  2. Hyperlipidemia  3. HTN  4. Aortic root/ascending aortic aneurysm: 4/12 Echo with 5.4 cm aortic root; 4/12 MRA chest with 5.5 cm aortic root, 3.8 cm ascending thoracic aorta; 10/12 CTA chest with 6.1 x 5.1 cm aortic root/proximal ascending aorta, 4.2 x 4.1 cm mid ascending aorta; 1/13 Echo with 5.4 cm root, 5.5 cm proximal ascending thoracic aorta. S/p valve-sparing aortic root and ascending aorta replacement with CABG x 1 (SVG-RCA) 04/2012 with Dr. Servando Snare.  5. Aortic insufficiency: Echo (1/13): EF 55%,  mild LVH, 5.4 cm aortic root, 5.5 cm proximal ascending thoracic aorta. Trileaflet aortic valve with trivial AI. Echo (10/13) EF 55-60%, s/p valve-sparing aortic root replacement, eccentric mild to moderate AI, normal RV.  6. Possible contrast allergy  7. CAD: Myoview (4/12): EF 67%, no ischemia or infarction. LHC (1/13) with long 50% D2 stenosis, 60% proximal RCA, and 50% mid RCA. S/p CABG x 1 with SVG0-RCA at time of ascending aortic aneurysm repair.  8. ? Brief peri-operative atrial fibrillation in 7/13. Nausea with amiodarone.  9. Diastolic CHF  Past Surgical History:  Procedure Laterality Date  . APPENDECTOMY  12/2016  . Colonscopy    . CORONARY ARTERY BYPASS GRAFT  05/07/2012   Procedure: CORONARY ARTERY BYPASS GRAFTING (CABG);  Surgeon: Grace Isaac, MD;  Location: [facility];  Service: Open Heart Surgery;  Laterality: N/A;  . KNEE ARTHROSCOPY Left 2010   Dr. Rosamaria Lints  . LAPAROSCOPIC APPENDECTOMY N/A 01/06/2017   Procedure: APPENDECTOMY LAPAROSCOPIC;  Surgeon: Ralene Ok, MD;  Location: [facility];  Service: General;  Laterality: N/A;  . Retactment of fingers     as a child , sewed with sewing machine- Index and middle finger  . UPPER GASTROINTESTINAL ENDOSCOPY      Current Medications: Current Meds  Medication Sig  . acetaminophen (TYLENOL) 500 MG tablet TAKE 1-2 TABS EVERY 6 HOURS AS NEEDED FOR PAIN  . aspirin EC 81 MG tablet Take 81 mg by mouth at bedtime.  Marland Kitchen atorvastatin (LIPITOR) 40 MG tablet Take 1 tablet (40 mg total) by mouth daily.  . carvedilol (COREG) 12.5 MG tablet Take 1 tablet (12.5 mg total) by mouth 2 (two) times daily.  . diclofenac sodium (VOLTAREN) 1 % GEL Apply 2 g topically 4 (four) times daily as needed (PAIN).  . felodipine (PLENDIL) 10 MG 24 hr tablet TAKE ONE TABLET BY MOUTH DAILY  . furosemide (LASIX) 40 MG tablet TAKE ONE TABLET BY MOUTH DAILY  . lisinopril (PRINIVIL,ZESTRIL) 20 MG tablet TAKE ONE TABLET BY MOUTH DAILY  . loratadine (CLARITIN) 10 MG  tablet Take 10 mg by mouth at bedtime.   . metFORMIN (GLUCOPHAGE) 1000 MG tablet Take 1,000 mg by mouth 2 (two) times daily with a meal.  . mirabegron ER (MYRBETRIQ) 50 MG TB24 tablet Take 50 mg by mouth at bedtime.   . promethazine (PHENERGAN) 25 MG tablet Take 1 tablet (25 mg total) by mouth every 6 (six) hours as needed for nausea or vomiting.  . sertraline (ZOLOFT) 100 MG tablet Take 1 tablet (100 mg total) by mouth daily.  . tamsulosin (FLOMAX) 0.4 MG CAPS capsule Take 2 capsules (0.8 mg total) by mouth daily after supper.  . traMADol (ULTRAM) 50 MG  tablet TAKE TWO TABLETS BY MOUTH EVERY 6 HOURS AS NEEDED     Allergies:   Ibuprofen and Iodinated diagnostic agents   Social History   Tobacco Use  . Smoking status: Former Smoker    Packs/day: 1.00    Years: 13.00    Pack years: 13.00    Types: Cigarettes    Last attempt to quit: 01/23/1988    Years since quitting: 29.8  . Smokeless tobacco: Never Used  Substance Use Topics  . Alcohol use: Yes    Comment: quit 1989  . Drug use: No    Comment: previous cocaine and marijuana use, quit 1988     Family Hx: The patient's He was adopted. Family history is unknown by patient.  ROS:   Please see the history of present illness.    Review of Systems  Gastrointestinal: Positive for abdominal pain.  Psychiatric/Behavioral: Positive for depression. The patient is nervous/anxious.    All other systems reviewed and are negative.   EKGs/Labs/Other Test Reviewed:    EKG:  EKG is  ordered today.  The ekg ordered today demonstrates normal sinus rhythm, heart rate 63, normal axis, nonspecific ST-T wave changes, QTC 433 ms  Recent Labs: 11/25/2016: B Natriuretic Peptide 38.3 06/08/2017: ALT 17; BUN 11; Creatinine, Ser 0.76; Hemoglobin 14.2; Platelets 260; Potassium 3.5; Sodium 139   Recent Lipid Panel Lab Results  Component Value Date/Time   CHOL 160 06/13/2017 10:20 AM   TRIG 75 06/13/2017 10:20 AM   HDL 40 06/13/2017 10:20 AM    CHOLHDL 4.0 06/13/2017 10:20 AM   CHOLHDL 4.8 11/25/2016 03:31 PM   LDLCALC 105 (H) 06/13/2017 10:20 AM    Physical Exam:    VS:  BP (!) 148/82   Pulse 64   Ht 6\' 3"  (1.905 m)   Wt (!) 300 lb 3.2 oz (136.2 kg)   SpO2 97%   BMI 37.52 kg/m     Wt Readings from Last 3 Encounters:  11/20/17 (!) 300 lb 3.2 oz (136.2 kg)  10/23/17 299 lb 12.8 oz (136 kg)  09/15/17 293 lb 6.4 oz (133.1 kg)     Physical Exam  Constitutional: He is oriented to person, place, and time. He appears well-developed and well-nourished. No distress.  HENT:  Head: Normocephalic and atraumatic.  Eyes: No scleral icterus.  Neck: No JVD present.  Cardiovascular: Normal rate and regular rhythm.  No murmur heard. Pulmonary/Chest: Effort normal. He has no rales.  Abdominal: Soft.  Musculoskeletal: He exhibits no edema.  Neurological: He is alert and oriented to person, place, and time.  Skin: Skin is warm and dry.  Psychiatric: He has a normal mood and affect.    ASSESSMENT:    1. Coronary artery disease involving native coronary artery of native heart without angina pectoris   2. Chronic diastolic CHF (congestive heart failure) ([place])   3. Hx of ascending aorta replacement   4. Nonrheumatic aortic valve insufficiency   5. Essential hypertension   6. Hyperlipidemia, unspecified hyperlipidemia type   7. Pain in both lower extremities    PLAN:    In order of problems listed above:  1.  Coronary artery disease History of CABG x1 in July 2013.  He denies anginal symptoms.  Continue aspirin, statin, beta-blocker, ACE inhibitor.  2.  Chronic diastolic CHF (congestive heart failure) ([place]) Echo 2/18 with normal LV function and moderate diastolic dysfunction.  NYHA 2.  Volume is stable on current therapy.  Continue current regimen.  3.  Hx of  ascending aorta replacement  Last echo with stable aortic root dilatation and ascending aorta dilation.  He continues follow-up with Dr. Servando Snare.  Arrange follow-up  echo March 2019.  4.  Nonrheumatic aortic valve insufficiency  Arrange follow-up echo March 2019.  I cannot appreciate murmur on exam today.  5.  Essential hypertension  Blood pressure elevated today.  He has not taken medications yet.  He checks his blood pressure at home.  He typically gets blood pressures in the 130/80 range.  Continue current therapy.  Check CMET today  6.  Hyperlipidemia, unspecified hyperlipidemia type Goal LDL less than 70.  Continue statin.  Check follow-up lipids, CMET today.  7.  Pain in both lower extremities  He is concerned about PAD.  He has some pain in his thighs bilaterally with exertion.  This is worse on the left.  It is difficult to palpate his pulses.  ABIs were normal in 2013.  I will arrange follow-up ABIs.   Dispo: I will establish him with Dr. Harrell Gave End here in our clinic as he does not wish to return to Dr. Loralie Champagne in the [facility].  Return in about 6 months (around 05/20/2018) for Routine Follow Up, w/ Dr. Saunders Revel, or Richardson Dopp, PA-C.    Medication Adjustments/Labs and Tests Ordered: Current medicines are reviewed at length with the patient today.  Concerns regarding medicines are outlined above.  Tests Ordered: Orders Placed This Encounter  Procedures  . Comprehensive metabolic panel  . Lipid panel  . EKG 12-Lead  . ECHOCARDIOGRAM COMPLETE   Medication Changes: No orders of the defined types were placed in this encounter.   Signed, Richardson Dopp, PA-C  11/20/2017 2:31 PM    [facility] [address], [place], [place]  [address] Phone: [phone]; Fax: [phone]

## 2017-11-20 NOTE — Telephone Encounter (Signed)
-----   Message from Liliane Shi, [place] sent at 11/20/2017  4:57 PM EST ----- Please call the patient Lipids much improved since diet/weight loss. Continue current medications and follow up as planned.  Richardson Dopp, PA-C 11/20/2017 4:57 PM

## 2017-11-20 NOTE — Patient Instructions (Addendum)
Medication Instructions:  No changes.   Labwork: Today - CMET, Lipids  Testing/Procedures: 1. Your physician has requested that you have an   Echocardiogram in March 2019. Echocardiography is a painless test that uses sound waves to create images of your heart. It provides your doctor with information about the size and shape of your heart and how well your heart's chambers and valves are working. This procedure takes approximately one hour. There are no restrictions for this procedure.   2. Your physician has requested that you have a lower extremity arterial exercise duplex. During this test, exercise and ultrasound are used to evaluate arterial blood flow in the legs. Allow one hour for this exam. There are no restrictions or special instructions. THIS IS TO BE DONE WITH ABI'S    Follow-Up: Richardson Dopp, PA-C or Dr. Harrell Gave End in 6 months.   Any Other Special Instructions Will Be Listed Below (If Applicable).  If you need a refill on your cardiac medications before your next appointment, please call your pharmacy.

## 2017-11-30 ENCOUNTER — Other Ambulatory Visit: Payer: Self-pay | Admitting: Physician Assistant

## 2017-11-30 DIAGNOSIS — M25562 Pain in left knee: Principal | ICD-10-CM

## 2017-11-30 DIAGNOSIS — G8929 Other chronic pain: Secondary | ICD-10-CM

## 2017-12-01 NOTE — Telephone Encounter (Signed)
Tramadol  refill Last OV:08/23/17 Last Refill:10/19/17 Pharmacy: [place], [place]

## 2017-12-01 NOTE — Telephone Encounter (Signed)
Meds ordered this encounter  Medications  . traMADol (ULTRAM) 50 MG tablet    Sig: TAKE TWO TABLETS BY MOUTH EVERY 6 HOURS AS NEEDED    Dispense:  90 tablet    Refill:  0

## 2017-12-01 NOTE — Telephone Encounter (Signed)
Please advise 

## 2017-12-16 ENCOUNTER — Encounter (HOSPITAL_COMMUNITY): Payer: Self-pay | Admitting: Emergency Medicine

## 2017-12-16 ENCOUNTER — Observation Stay (HOSPITAL_COMMUNITY): Payer: BLUE CROSS/BLUE SHIELD

## 2017-12-16 ENCOUNTER — Emergency Department (HOSPITAL_COMMUNITY): Payer: BLUE CROSS/BLUE SHIELD

## 2017-12-16 ENCOUNTER — Inpatient Hospital Stay (HOSPITAL_COMMUNITY)
Admission: EM | Admit: 2017-12-16 | Discharge: 2017-12-18 | DRG: 065 | Disposition: A | Discharge status: Home / Self Care | Payer: BLUE CROSS/BLUE SHIELD | Attending: Internal Medicine | Admitting: Internal Medicine

## 2017-12-16 ENCOUNTER — Ambulatory Visit: Payer: Self-pay | Admitting: *Deleted

## 2017-12-16 DIAGNOSIS — J449 Chronic obstructive pulmonary disease, unspecified: Secondary | ICD-10-CM | POA: Diagnosis not present

## 2017-12-16 DIAGNOSIS — Z7984 Long term (current) use of oral hypoglycemic drugs: Secondary | ICD-10-CM

## 2017-12-16 DIAGNOSIS — Z87891 Personal history of nicotine dependence: Secondary | ICD-10-CM | POA: Diagnosis not present

## 2017-12-16 DIAGNOSIS — Z886 Allergy status to analgesic agent status: Secondary | ICD-10-CM | POA: Diagnosis not present

## 2017-12-16 DIAGNOSIS — Z91041 Radiographic dye allergy status: Secondary | ICD-10-CM | POA: Diagnosis not present

## 2017-12-16 DIAGNOSIS — E119 Type 2 diabetes mellitus without complications: Secondary | ICD-10-CM | POA: Diagnosis present

## 2017-12-16 DIAGNOSIS — M1990 Unspecified osteoarthritis, unspecified site: Secondary | ICD-10-CM | POA: Diagnosis present

## 2017-12-16 DIAGNOSIS — R29700 NIHSS score 0: Secondary | ICD-10-CM | POA: Diagnosis present

## 2017-12-16 DIAGNOSIS — F329 Major depressive disorder, single episode, unspecified: Secondary | ICD-10-CM | POA: Diagnosis present

## 2017-12-16 DIAGNOSIS — E785 Hyperlipidemia, unspecified: Secondary | ICD-10-CM | POA: Diagnosis not present

## 2017-12-16 DIAGNOSIS — I110 Hypertensive heart disease with heart failure: Secondary | ICD-10-CM | POA: Diagnosis present

## 2017-12-16 DIAGNOSIS — G459 Transient cerebral ischemic attack, unspecified: Secondary | ICD-10-CM

## 2017-12-16 DIAGNOSIS — I639 Cerebral infarction, unspecified: Secondary | ICD-10-CM | POA: Diagnosis not present

## 2017-12-16 DIAGNOSIS — R42 Dizziness and giddiness: Secondary | ICD-10-CM | POA: Diagnosis not present

## 2017-12-16 DIAGNOSIS — E669 Obesity, unspecified: Secondary | ICD-10-CM | POA: Diagnosis present

## 2017-12-16 DIAGNOSIS — Z6839 Body mass index (BMI) 39.0-39.9, adult: Secondary | ICD-10-CM | POA: Diagnosis not present

## 2017-12-16 DIAGNOSIS — G4733 Obstructive sleep apnea (adult) (pediatric): Secondary | ICD-10-CM | POA: Diagnosis present

## 2017-12-16 DIAGNOSIS — Z79899 Other long term (current) drug therapy: Secondary | ICD-10-CM

## 2017-12-16 DIAGNOSIS — G8929 Other chronic pain: Secondary | ICD-10-CM | POA: Diagnosis present

## 2017-12-16 DIAGNOSIS — N400 Enlarged prostate without lower urinary tract symptoms: Secondary | ICD-10-CM | POA: Diagnosis present

## 2017-12-16 DIAGNOSIS — I5032 Chronic diastolic (congestive) heart failure: Secondary | ICD-10-CM | POA: Diagnosis not present

## 2017-12-16 DIAGNOSIS — Z7982 Long term (current) use of aspirin: Secondary | ICD-10-CM | POA: Diagnosis not present

## 2017-12-16 DIAGNOSIS — Z8673 Personal history of transient ischemic attack (TIA), and cerebral infarction without residual deficits: Secondary | ICD-10-CM | POA: Diagnosis present

## 2017-12-16 DIAGNOSIS — I2510 Atherosclerotic heart disease of native coronary artery without angina pectoris: Secondary | ICD-10-CM | POA: Diagnosis not present

## 2017-12-16 DIAGNOSIS — Z951 Presence of aortocoronary bypass graft: Secondary | ICD-10-CM | POA: Diagnosis not present

## 2017-12-16 DIAGNOSIS — I10 Essential (primary) hypertension: Secondary | ICD-10-CM | POA: Diagnosis present

## 2017-12-16 DIAGNOSIS — R479 Unspecified speech disturbances: Secondary | ICD-10-CM

## 2017-12-16 LAB — COMPREHENSIVE METABOLIC PANEL
ALT: 14 U/L — ABNORMAL LOW (ref 17–63)
AST: 14 U/L — ABNORMAL LOW (ref 15–41)
Albumin: 4 g/dL (ref 3.5–5.0)
Alkaline Phosphatase: 71 U/L (ref 38–126)
Anion gap: 10 (ref 5–15)
BUN: 10 mg/dL (ref 6–20)
CO2: 27 mmol/L (ref 22–32)
Calcium: 9.3 mg/dL (ref 8.9–10.3)
Chloride: 105 mmol/L (ref 101–111)
Creatinine, Ser: 0.72 mg/dL (ref 0.61–1.24)
GFR calc Af Amer: >60 mL/min (ref >60)
GFR calc non Af Amer: >60 mL/min (ref >60)
Glucose, Bld: 114 mg/dL — ABNORMAL HIGH (ref 65–99)
Potassium: 3.7 mmol/L (ref 3.5–5.1)
Sodium: 142 mmol/L (ref 135–145)
Total Bilirubin: 0.6 mg/dL (ref 0.3–1.2)
Total Protein: 7 g/dL (ref 6.5–8.1)

## 2017-12-16 LAB — DIFFERENTIAL
Basophils Absolute: 0.1 10*3/uL (ref 0.0–0.1)
Basophils Relative: 1 %
Eosinophils Absolute: 0.2 10*3/uL (ref 0.0–0.7)
Eosinophils Relative: 2 %
Lymphocytes Relative: 16 %
Lymphs Abs: 1.3 10*3/uL (ref 0.7–4.0)
Monocytes Absolute: 0.7 10*3/uL (ref 0.1–1.0)
Monocytes Relative: 8 %
Neutro Abs: 6.3 10*3/uL (ref 1.7–7.7)
Neutrophils Relative %: 73 %

## 2017-12-16 LAB — URINALYSIS, ROUTINE W REFLEX MICROSCOPIC
Bilirubin Urine: NEGATIVE
Glucose, UA: NEGATIVE mg/dL
Hgb urine dipstick: NEGATIVE
Ketones, ur: NEGATIVE mg/dL
Leukocytes, UA: NEGATIVE
Nitrite: NEGATIVE
Protein, ur: NEGATIVE mg/dL
Specific Gravity, Urine: 1.01 (ref 1.005–1.030)
pH: 7 (ref 5.0–8.0)

## 2017-12-16 LAB — CBC
HCT: 39 % (ref 39.0–52.0)
Hemoglobin: 13.3 g/dL (ref 13.0–17.0)
MCH: 29.2 pg (ref 26.0–34.0)
MCHC: 34.1 g/dL (ref 30.0–36.0)
MCV: 85.7 fL (ref 78.0–100.0)
Platelets: 264 10*3/uL (ref 150–400)
RBC: 4.55 MIL/uL (ref 4.22–5.81)
RDW: 14.4 % (ref 11.5–15.5)
WBC: 8.6 10*3/uL (ref 4.0–10.5)

## 2017-12-16 LAB — PROTIME-INR
INR: 1.01
Prothrombin Time: 13.2 seconds (ref 11.4–15.2)

## 2017-12-16 LAB — RAPID URINE DRUG SCREEN, HOSP PERFORMED
Amphetamines: NOT DETECTED
Barbiturates: NOT DETECTED
Benzodiazepines: NOT DETECTED
Cocaine: NOT DETECTED
Opiates: NOT DETECTED
Tetrahydrocannabinol: NOT DETECTED

## 2017-12-16 LAB — I-STAT TROPONIN, ED: Troponin i, poc: 0 ng/mL (ref 0.00–0.08)

## 2017-12-16 LAB — ETHANOL: Alcohol, Ethyl (B): <10 mg/dL (ref <10)

## 2017-12-16 LAB — APTT: aPTT: 31 seconds (ref 24–36)

## 2017-12-16 LAB — CBG MONITORING, ED: Glucose-Capillary: 80 mg/dL (ref 65–99)

## 2017-12-16 MED ORDER — STROKE: EARLY STAGES OF RECOVERY BOOK
Freq: Once | Status: DC
  Filled 2017-12-16: qty 1

## 2017-12-16 MED ORDER — ACETAMINOPHEN 325 MG PO TABS
650.0000 mg | ORAL_TABLET | ORAL | Status: DC | PRN
  Administered 2017-12-16 – 2017-12-17 (×2): 650 mg via ORAL
  Filled 2017-12-16 (×2): qty 2

## 2017-12-16 MED ORDER — ASPIRIN EC 81 MG PO TBEC: 81.0000 mg | DELAYED_RELEASE_TABLET | Freq: Every day | ORAL | Status: DC

## 2017-12-16 MED ORDER — ACETAMINOPHEN 650 MG RE SUPP
650.0000 mg | RECTAL | Status: DC | PRN
Start: 2017-12-16 — End: 2017-12-18

## 2017-12-16 MED ORDER — FELODIPINE ER 10 MG PO TB24
10.0000 mg | ORAL_TABLET | Freq: Every day | ORAL | Status: DC
  Administered 2017-12-16: 10 mg via ORAL
  Filled 2017-12-16 (×3): qty 1

## 2017-12-16 MED ORDER — SERTRALINE HCL 50 MG PO TABS
100.0000 mg | ORAL_TABLET | Freq: Every day | ORAL | Status: DC
  Administered 2017-12-17 – 2017-12-18 (×2): 100 mg via ORAL
  Filled 2017-12-16 (×3): qty 2

## 2017-12-16 MED ORDER — LISINOPRIL 20 MG PO TABS
20.0000 mg | ORAL_TABLET | Freq: Every day | ORAL | Status: DC
  Administered 2017-12-17 – 2017-12-18 (×2): 20 mg via ORAL
  Filled 2017-12-16 (×3): qty 1

## 2017-12-16 MED ORDER — ACETAMINOPHEN 160 MG/5ML PO SOLN: 650.0000 mg | ORAL | Status: DC | PRN

## 2017-12-16 MED ORDER — FUROSEMIDE 40 MG PO TABS
40.0000 mg | ORAL_TABLET | Freq: Every day | ORAL | Status: DC
  Administered 2017-12-17 – 2017-12-18 (×2): 40 mg via ORAL
  Filled 2017-12-16 (×3): qty 1

## 2017-12-16 MED ORDER — ASPIRIN EC 325 MG PO TBEC
325.0000 mg | DELAYED_RELEASE_TABLET | Freq: Every day | ORAL | Status: DC
  Administered 2017-12-16 – 2017-12-17 (×2): 325 mg via ORAL
  Filled 2017-12-16 (×2): qty 1

## 2017-12-16 MED ORDER — ATORVASTATIN CALCIUM 40 MG PO TABS
40.0000 mg | ORAL_TABLET | Freq: Every day | ORAL | Status: DC
  Administered 2017-12-16: 40 mg via ORAL
  Filled 2017-12-16: qty 1

## 2017-12-16 MED ORDER — CARVEDILOL 12.5 MG PO TABS
12.5000 mg | ORAL_TABLET | Freq: Two times a day (BID) | ORAL | Status: DC
  Administered 2017-12-16 – 2017-12-18 (×4): 12.5 mg via ORAL
  Filled 2017-12-16 (×4): qty 1

## 2017-12-16 MED ORDER — INSULIN ASPART 100 UNIT/ML ~~LOC~~ SOLN
0.0000 [IU] | Freq: Three times a day (TID) | SUBCUTANEOUS | Status: DC
  Administered 2017-12-18: 1 [IU] via SUBCUTANEOUS

## 2017-12-16 NOTE — H&P (Signed)
History and Physical    Jonathan Miles PPI:[id number] DOB: October 08, 1961 DOA: 12/16/2017  PCP: Harrison Mons, PA-C  Patient coming from: Dizziness and slurred speech  Chief Complaint: Home  HPI: Jonathan Miles is a 57 y.o. male with medical history significant of coronary artery disease, congestive heart failure, COPD, diabetes comes in with over 1 day of dizziness and slurred speech that started yesterday at 4 PM.  He denies any vision changes.  He denies any numbness tingling or weakness anywhere.  He denies any tongue numbness or heaviness.  The dizziness is worse when he gets up and moves around.  He denies any headache.  He denies any fevers.  He denies any nausea vomiting or diarrhea.  He denies any history of vertigo.  Patient is referred for admission for possible TIA versus CVA.  Neurology at home was called who recommended transferring to come for neurological evaluation.   Review of Systems: As per HPI otherwise 10 point review of systems negative.   Past Medical History:  Diagnosis Date  . Alcohol abuse   . Aortic root dilatation (HCC)    a. echo 4/12: EF 55-60%, mod to marked dilated ascending aortic root;   b. MRA 4/12: Aortic root 5.5 cm, dilation of left and right coronary cusps, trileaflet aortic valve  . Arthritis   . Asthma    ast attack in early 20's  . CAD (coronary artery disease)   . Chest pain    Myoview 4/12: EF 62%, no ischemia or scar  . CHF (congestive heart failure) ([place])   . Chronic pain    back and hands  . Constipation   . COPD (chronic obstructive pulmonary disease) ([place])   . Depression   . Diabetes mellitus    type 2  . Drug use   . Enlarged prostate   . Fatigue   . GERD (gastroesophageal reflux disease)    modifies with diet  . Hx of echocardiogram    Echo (2/16):  Mod LVH, EF 55-60%, mild to mod AI, mod LAE, mild RAE, Aortic root 38 mm, Asc aorta 43 mm  . Hyperlipidemia   . Hypertension   . Joint pain   . Knee pain, bilateral   . OSA  (obstructive sleep apnea) 2006   .  Wears at times  . Osteoarthritis   . Palpitations   . PONV (postoperative nausea and vomiting)   . SOB (shortness of breath)     Past Surgical History:  Procedure Laterality Date  . APPENDECTOMY  12/2016  . Colonscopy    . CORONARY ARTERY BYPASS GRAFT  05/07/2012   Procedure: CORONARY ARTERY BYPASS GRAFTING (CABG);  Surgeon: Grace Isaac, MD;  Location: [facility];  Service: Open Heart Surgery;  Laterality: N/A;  . KNEE ARTHROSCOPY Left 2010   Dr. Rosamaria Lints  . LAPAROSCOPIC APPENDECTOMY N/A 01/06/2017   Procedure: APPENDECTOMY LAPAROSCOPIC;  Surgeon: Ralene Ok, MD;  Location: [facility];  Service: General;  Laterality: N/A;  . Retactment of fingers     as a child , sewed with sewing machine- Index and middle finger  . UPPER GASTROINTESTINAL ENDOSCOPY       reports that he quit smoking about 29 years ago. His smoking use included cigarettes. He has a 13.00 pack-year smoking history. he has never used smokeless tobacco. He reports that he drinks alcohol. He reports that he does not use drugs.  Allergies  Allergen Reactions  . Ibuprofen Other (See Comments)    TOLD NOT TO TAKE-PER CARDS  .  Iodinated Diagnostic Agents Hives    Hives started 4 days after cta chest was performed, minimal relief w/ benadryl x 3 days, uncertain if reaction was actually iv contrast or other allergen,allergy tests to follow.wife will make [place] aware of results//[email or web address]    Family History  Adopted: Yes  Family history unknown: Yes    Prior to Admission medications   Medication Sig Start Date End Date Taking? Authorizing Provider  acetaminophen (TYLENOL) 500 MG tablet TAKE 1-2 TABS EVERY 6 HOURS AS NEEDED FOR PAIN 05/26/12   Richardson Dopp T, PA-C  aspirin EC 81 MG tablet Take 81 mg by mouth at bedtime.    [provider]  atorvastatin (LIPITOR) 40 MG tablet Take 1 tablet (40 mg total) by mouth daily. 10/31/16   Harrison Mons, PA-C  carvedilol (COREG) 12.5 MG  tablet Take 1 tablet (12.5 mg total) by mouth 2 (two) times daily. 10/31/16   Harrison Mons, PA-C  diclofenac sodium (VOLTAREN) 1 % GEL Apply 2 g topically 4 (four) times daily as needed (PAIN). 08/05/17   Harrison Mons, PA-C  felodipine (PLENDIL) 10 MG 24 hr tablet TAKE ONE TABLET BY MOUTH DAILY 11/03/17   Harrison Mons, PA-C  furosemide (LASIX) 40 MG tablet TAKE ONE TABLET BY MOUTH DAILY 11/03/17   Harrison Mons, PA-C  lisinopril (PRINIVIL,ZESTRIL) 20 MG tablet TAKE ONE TABLET BY MOUTH DAILY 11/03/17   Harrison Mons, PA-C  loratadine (CLARITIN) 10 MG tablet Take 10 mg by mouth at bedtime.     [provider]  metFORMIN (GLUCOPHAGE) 1000 MG tablet Take 1,000 mg by mouth 2 (two) times daily with a meal.    [provider]  mirabegron ER (MYRBETRIQ) 50 MG TB24 tablet Take 50 mg by mouth at bedtime.     [provider]  promethazine (PHENERGAN) 25 MG tablet Take 1 tablet (25 mg total) by mouth every 6 (six) hours as needed for nausea or vomiting. 06/08/17   Lawyer, Harrell Gave, PA-C  sertraline (ZOLOFT) 100 MG tablet Take 1 tablet (100 mg total) by mouth daily. 09/15/17   Harrison Mons, PA-C  tamsulosin (FLOMAX) 0.4 MG CAPS capsule Take 2 capsules (0.8 mg total) by mouth daily after supper. 09/15/17   Harrison Mons, PA-C  traMADol (ULTRAM) 50 MG tablet TAKE TWO TABLETS BY MOUTH EVERY 6 HOURS AS NEEDED 12/01/17   Harrison Mons, PA-C    Physical Exam: Vitals:   12/16/17 1022 12/16/17 1034  BP: (!) 143/88   Pulse: 62   Resp: 20   Temp: 97.8 F (36.6 C) 97.8 F (36.6 C)  TempSrc: Oral   SpO2: 98%       Constitutional: NAD, calm, comfortable Vitals:   12/16/17 1022 12/16/17 1034  BP: (!) 143/88   Pulse: 62   Resp: 20   Temp: 97.8 F (36.6 C) 97.8 F (36.6 C)  TempSrc: Oral   SpO2: 98%    Eyes: PERRL, lids and conjunctivae normal ENMT: Mucous membranes are moist. Posterior pharynx clear of any exudate or lesions.Normal dentition.  Neck: normal,  supple, no masses, no thyromegaly Respiratory: clear to auscultation bilaterally, no wheezing, no crackles. Normal respiratory effort. No accessory muscle use.  Cardiovascular: Regular rate and rhythm, no murmurs / rubs / gallops. No extremity edema. 2+ pedal pulses. No carotid bruits.  Abdomen: no tenderness, no masses palpated. No hepatosplenomegaly. Bowel sounds positive.  Musculoskeletal: no clubbing / cyanosis. No joint deformity upper and lower extremities. Good ROM, no contractures. Normal muscle tone.  Skin: no rashes, lesions, ulcers.  No induration Neurologic: CN 2-12 grossly intact. Sensation intact, DTR normal. Strength 5/5 in all 4.  Psychiatric: Normal judgment and insight. Alert and oriented x 3. Normal mood.    Labs on Admission: I have personally reviewed following labs and imaging studies  CBC: Recent Labs  Lab 12/16/17 1109  WBC 8.6  NEUTROABS 6.3  HGB 13.3  HCT 39.0  MCV 85.7  PLT 373   Basic Metabolic Panel: Recent Labs  Lab 12/16/17 1109  NA 142  K 3.7  CL 105  CO2 27  GLUCOSE 114*  BUN 10  CREATININE 0.72  CALCIUM 9.3   GFR: CrCl cannot be calculated (Unknown ideal weight.). Liver Function Tests: Recent Labs  Lab 12/16/17 1109  AST 14*  ALT 14*  ALKPHOS 71  BILITOT 0.6  PROT 7.0  ALBUMIN 4.0   No results for input(s): LIPASE, AMYLASE in the last 168 hours. No results for input(s): AMMONIA in the last 168 hours. Coagulation Profile: Recent Labs  Lab 12/16/17 1109  INR 1.01   Cardiac Enzymes: No results for input(s): CKTOTAL, CKMB, CKMBINDEX, TROPONINI in the last 168 hours. BNP (last 3 results) No results for input(s): PROBNP in the last 8760 hours. HbA1C: No results for input(s): HGBA1C in the last 72 hours. CBG: No results for input(s): GLUCAP in the last 168 hours. Lipid Profile: No results for input(s): CHOL, HDL, LDLCALC, TRIG, CHOLHDL, LDLDIRECT in the last 72 hours. Thyroid Function Tests: No results for input(s): TSH,  T4TOTAL, FREET4, T3FREE, THYROIDAB in the last 72 hours. Anemia Panel: No results for input(s): VITAMINB12, FOLATE, FERRITIN, TIBC, IRON, RETICCTPCT in the last 72 hours. Urine analysis:    Component Value Date/Time   COLORURINE YELLOW 12/16/2017 1109   APPEARANCEUR CLEAR 12/16/2017 1109   LABSPEC 1.010 12/16/2017 1109   PHURINE 7.0 12/16/2017 1109   GLUCOSEU NEGATIVE 12/16/2017 1109   [place] 12/16/2017 1109   [place] 12/16/2017 1109   KETONESUR NEGATIVE 12/16/2017 1109   PROTEINUR NEGATIVE 12/16/2017 1109   UROBILINOGEN 1.0 05/13/2012 1018   NITRITE NEGATIVE 12/16/2017 1109   LEUKOCYTESUR NEGATIVE 12/16/2017 1109   Sepsis Labs: !!!!!!!!!!!!!!!!!!!!!!!!!!!!!!!!!!!!!!!!!!!! @LABRCNTIP (procalcitonin:4,lacticidven:4) )No results found for this or any previous visit (from the past 240 hour(s)).   Radiological Exams on Admission: Dg Chest 2 View  Result Date: 12/16/2017 CLINICAL DATA:  Bilateral lower extremity weakness and dizziness since 12/15/2017. EXAM: CHEST  2 VIEW COMPARISON:  CT chest 10/15/2015.  PA and lateral chest 10/07/2012. FINDINGS: The lungs are clear. Heart size is normal. No pneumothorax or pleural effusion. The patient is status post CABG. No acute bony abnormality. IMPRESSION: No acute disease. Electronically Signed   By: Inge Rise M.D.   On: 12/16/2017 11:39   Ct Head Wo Contrast  Result Date: 12/16/2017 CLINICAL DATA:  Ataxia.  Slurred speech.  Dizziness. EXAM: CT HEAD WITHOUT CONTRAST TECHNIQUE: Contiguous axial images were obtained from the base of the skull through the vertex without intravenous contrast. COMPARISON:  None. FINDINGS: Brain: There is no evidence of acute infarct, intracranial hemorrhage, mass, midline shift, or extra-axial fluid collection. The ventricles and sulci are normal. Subcortical greater than periventricular white matter hypodensities are nonspecific but compatible with mild chronic small vessel ischemic disease.  Vascular: Calcified atherosclerosis at the skull base. No hyperdense vessel. Skull: No fracture or focal osseous lesion. Sinuses/Orbits: Visualized paranasal sinuses and mastoid air cells are clear. Orbits are unremarkable. Other: None. IMPRESSION: 1. No evidence of acute intracranial abnormality. 2. Mild chronic small vessel ischemic disease. Electronically  Signed   By: Logan Bores M.D.   On: 12/16/2017 11:47    EKG: Independently reviewed. nsr no acute issues Old chart reviewed Case discussed with EDP  Assessment/Plan 57 year old male with multiple risk factors comes in with TIA-like symptoms Principal Problem:   TIA (transient ischemic attack)-MRI brain.  Carotid Dopplers.  Cardiac echo.  Placed on full dose aspirin.  He takes with baby aspirin a day already.  Placed on cardiac telemetry.  Transfer to [facility] for neurological consultation and evaluation. Active Problems:   Difficulty speaking-as above   Dizziness-as above   Hyperlipidemia-continue statin check fasting lipid panel in the morning   Hypertension-continue home meds   CAD (coronary artery disease)-stable   Chronic diastolic CHF (congestive heart failure) (HCC)-stable   Type 2 diabetes mellitus without complication, without long-term current use of insulin (HCC)-hold metformin and place on sliding scale insulin check hemoglobin A1c  Patient is not happy about going to [facility] for neurological services.  His wife is saying that he will go.  I have put in a bed for [facility] at this time however if patient changes his mind will change to [facility].  However his wife is adamant that he will go to [facility].  DVT prophylaxis: SCDs Code Status: Full Family Communication: Wife Disposition Plan: Per day team Consults called: Neurology Admission status: Observation   Jonahtan,Chantay Whitelock A MD [facility]  If 7PM-7AM, please contact night-coverage [email or web address] Password [email or web address]  12/16/2017, 1:33 PM

## 2017-12-16 NOTE — ED Provider Notes (Signed)
[facility] DEPT Provider Note   CSN: [id number] Arrival date & time: 12/16/17  1012     History   Chief Complaint Chief Complaint  Patient presents with  . Difficulty Walking  . Generalized Body Aches    HPI Jonathan Miles is a 57 y.o. male.  HPI Patient presents with difficulty walking weakness and slurred speech.  Also a headache.  Began yesterday.  States he had difficulty at work.  States he also was unsteady and had lightheadedness and dizziness.  States he feels drunk and feels as if he would pass out.  States he does not drink alcohol.  No chest pain.  Reportedly has worsening when he moves around and stands up.  Also, however when he sits down.  No chest pain.  Does have a dull headache.  No fevers or chills.  Does not lateralize to left or right side.  Has had previous aortic root repair and CABG.  No recent change in medicines.  No vision changes. Past Medical History:  Diagnosis Date  . Alcohol abuse   . Aortic root dilatation (HCC)    a. echo 4/12: EF 55-60%, mod to marked dilated ascending aortic root;   b. MRA 4/12: Aortic root 5.5 cm, dilation of left and right coronary cusps, trileaflet aortic valve  . Arthritis   . Asthma    ast attack in early 20's  . CAD (coronary artery disease)   . Chest pain    Myoview 4/12: EF 62%, no ischemia or scar  . CHF (congestive heart failure) ([place])   . Chronic pain    back and hands  . Constipation   . COPD (chronic obstructive pulmonary disease) ([place])   . Depression   . Diabetes mellitus    type 2  . Drug use   . Enlarged prostate   . Fatigue   . GERD (gastroesophageal reflux disease)    modifies with diet  . Hx of echocardiogram    Echo (2/16):  Mod LVH, EF 55-60%, mild to mod AI, mod LAE, mild RAE, Aortic root 38 mm, Asc aorta 43 mm  . Hyperlipidemia   . Hypertension   . Joint pain   . Knee pain, bilateral   . OSA (obstructive sleep apnea) 2006   .  Wears at times  . Osteoarthritis    . Palpitations   . PONV (postoperative nausea and vomiting)   . SOB (shortness of breath)     Patient Active Problem List   Diagnosis Date Noted  . BMI 36.0-36.9,adult 08/05/2017  . Cholelithiasis 08/05/2017  . Bilateral plantar fasciitis 08/05/2017  . Situational mixed anxiety and depressive disorder 08/05/2017  . Bilateral primary osteoarthritis of knee 07/01/2017  . Asthma 01/26/2017  . Overactive bladder 01/21/2017  . BPH with obstruction/lower urinary tract symptoms 01/21/2017  . S/P appendectomy 01/07/2017  . Diverticulosis 11/24/2016  . Type 2 diabetes mellitus without complication, without long-term current use of insulin ([place]) 10/31/2016  . Aortic insufficiency 11/23/2012  . Chronic diastolic CHF (congestive heart failure) ([place]) 11/23/2012  . CAD (coronary artery disease) 11/27/2011  . OSA (obstructive sleep apnea) 11/11/2011  . Aortic root dilatation ([place])   . Hyperlipidemia 01/23/2011  . Hypertension 01/23/2011    Past Surgical History:  Procedure Laterality Date  . APPENDECTOMY  12/2016  . Colonscopy    . CORONARY ARTERY BYPASS GRAFT  05/07/2012   Procedure: CORONARY ARTERY BYPASS GRAFTING (CABG);  Surgeon: Grace Isaac, MD;  Location: [facility];  Service: Open  Heart Surgery;  Laterality: N/A;  . KNEE ARTHROSCOPY Left 2010   Dr. Rosamaria Lints  . LAPAROSCOPIC APPENDECTOMY N/A 01/06/2017   Procedure: APPENDECTOMY LAPAROSCOPIC;  Surgeon: Ralene Ok, MD;  Location: [facility];  Service: General;  Laterality: N/A;  . Retactment of fingers     as a child , sewed with sewing machine- Index and middle finger  . UPPER GASTROINTESTINAL ENDOSCOPY         Home Medications    Prior to Admission medications   Medication Sig Start Date End Date Taking? Authorizing Provider  acetaminophen (TYLENOL) 500 MG tablet TAKE 1-2 TABS EVERY 6 HOURS AS NEEDED FOR PAIN 05/26/12   Richardson Dopp T, PA-C  aspirin EC 81 MG tablet Take 81 mg by mouth at bedtime.    [provider]    atorvastatin (LIPITOR) 40 MG tablet Take 1 tablet (40 mg total) by mouth daily. 10/31/16   Harrison Mons, PA-C  carvedilol (COREG) 12.5 MG tablet Take 1 tablet (12.5 mg total) by mouth 2 (two) times daily. 10/31/16   Harrison Mons, PA-C  diclofenac sodium (VOLTAREN) 1 % GEL Apply 2 g topically 4 (four) times daily as needed (PAIN). 08/05/17   Harrison Mons, PA-C  felodipine (PLENDIL) 10 MG 24 hr tablet TAKE ONE TABLET BY MOUTH DAILY 11/03/17   Harrison Mons, PA-C  furosemide (LASIX) 40 MG tablet TAKE ONE TABLET BY MOUTH DAILY 11/03/17   Harrison Mons, PA-C  lisinopril (PRINIVIL,ZESTRIL) 20 MG tablet TAKE ONE TABLET BY MOUTH DAILY 11/03/17   Harrison Mons, PA-C  loratadine (CLARITIN) 10 MG tablet Take 10 mg by mouth at bedtime.     [provider]  metFORMIN (GLUCOPHAGE) 1000 MG tablet Take 1,000 mg by mouth 2 (two) times daily with a meal.    [provider]  mirabegron ER (MYRBETRIQ) 50 MG TB24 tablet Take 50 mg by mouth at bedtime.     [provider]  promethazine (PHENERGAN) 25 MG tablet Take 1 tablet (25 mg total) by mouth every 6 (six) hours as needed for nausea or vomiting. 06/08/17   Lawyer, Harrell Gave, PA-C  sertraline (ZOLOFT) 100 MG tablet Take 1 tablet (100 mg total) by mouth daily. 09/15/17   Harrison Mons, PA-C  tamsulosin (FLOMAX) 0.4 MG CAPS capsule Take 2 capsules (0.8 mg total) by mouth daily after supper. 09/15/17   Harrison Mons, PA-C  traMADol (ULTRAM) 50 MG tablet TAKE TWO TABLETS BY MOUTH EVERY 6 HOURS AS NEEDED 12/01/17   Harrison Mons, PA-C    Family History Family History  Adopted: Yes  Family history unknown: Yes    Social History Social History   Tobacco Use  . Smoking status: Former Smoker    Packs/day: 1.00    Years: 13.00    Pack years: 13.00    Types: Cigarettes    Last attempt to quit: 01/23/1988    Years since quitting: 29.9  . Smokeless tobacco: Never Used  Substance Use Topics  . Alcohol use: Yes    Comment: quit  1989  . Drug use: No    Comment: previous cocaine and marijuana use, quit 1988     Allergies   Ibuprofen and Iodinated diagnostic agents   Review of Systems Review of Systems  Constitutional: Negative for appetite change and fever.  HENT: Negative for congestion.   Respiratory: Negative for shortness of breath.   Cardiovascular: Negative for chest pain.  Endocrine: Negative for polyuria.  Genitourinary: Negative for dysuria.  Musculoskeletal: Negative for back pain.  Neurological: Positive  for dizziness, speech difficulty, weakness and headaches. Negative for tremors.  Psychiatric/Behavioral: Negative for confusion.     Physical Exam Updated Vital Signs BP (!) 143/88 (BP Location: Left Arm)   Pulse 62   Temp 97.8 F (36.6 C)   Resp 20   SpO2 98%   Physical Exam  Constitutional: He is oriented to person, place, and time. He appears well-developed.  HENT:  Head: Atraumatic.  Eyes: EOM are normal. Pupils are equal, round, and reactive to light.  Neck: Neck supple.  Cardiovascular: Normal rate.  Pulmonary/Chest: Effort normal. He has no wheezes. He has no rales.  Abdominal: Soft. He exhibits no distension.  Musculoskeletal: He exhibits no edema.  Neurological: He is alert and oriented to person, place, and time.  External operative movements intact.  Face is symmetric.  Good grip bilaterally.  Good strength in both his lower extremities.  Finger-nose may be slightly off on the right compared to the left.  Unsteadiness with Romberg but did not fall over.  Heel shin intact bilaterally.  Clear speech.  Skin: Skin is warm. Capillary refill takes less than 2 seconds.     ED Treatments / Results  Labs (all labs ordered are listed, but only abnormal results are displayed) Labs Reviewed  COMPREHENSIVE METABOLIC PANEL - Abnormal; Notable for the following components:      Result Value   Glucose, Bld 114 (*)    AST 14 (*)    ALT 14 (*)    All other components within normal  limits  ETHANOL  PROTIME-INR  APTT  CBC  DIFFERENTIAL  RAPID URINE DRUG SCREEN, HOSP PERFORMED  URINALYSIS, ROUTINE W REFLEX MICROSCOPIC  I-STAT TROPONIN, ED    EKG  EKG Interpretation  Date/Time:  Wednesday December 16 2017 11:21:38 EST Ventricular Rate:  57 PR Interval:    QRS Duration: 104 QT Interval:  445 QTC Calculation: 434 R Axis:   45 Text Interpretation:  Sinus rhythm Confirmed by Davonna Belling [phone]) on 12/16/2017 11:55:06 AM       Radiology Dg Chest 2 View  Result Date: 12/16/2017 CLINICAL DATA:  Bilateral lower extremity weakness and dizziness since 12/15/2017. EXAM: CHEST  2 VIEW COMPARISON:  CT chest 10/15/2015.  PA and lateral chest 10/07/2012. FINDINGS: The lungs are clear. Heart size is normal. No pneumothorax or pleural effusion. The patient is status post CABG. No acute bony abnormality. IMPRESSION: No acute disease. Electronically Signed   By: Inge Rise M.D.   On: 12/16/2017 11:39   Ct Head Wo Contrast  Result Date: 12/16/2017 CLINICAL DATA:  Ataxia.  Slurred speech.  Dizziness. EXAM: CT HEAD WITHOUT CONTRAST TECHNIQUE: Contiguous axial images were obtained from the base of the skull through the vertex without intravenous contrast. COMPARISON:  None. FINDINGS: Brain: There is no evidence of acute infarct, intracranial hemorrhage, mass, midline shift, or extra-axial fluid collection. The ventricles and sulci are normal. Subcortical greater than periventricular white matter hypodensities are nonspecific but compatible with mild chronic small vessel ischemic disease. Vascular: Calcified atherosclerosis at the skull base. No hyperdense vessel. Skull: No fracture or focal osseous lesion. Sinuses/Orbits: Visualized paranasal sinuses and mastoid air cells are clear. Orbits are unremarkable. Other: None. IMPRESSION: 1. No evidence of acute intracranial abnormality. 2. Mild chronic small vessel ischemic disease. Electronically Signed   By: Logan Bores M.D.    On: 12/16/2017 11:47    Procedures Procedures (including critical care time)  Medications Ordered in ED Medications - No data to display   Initial Impression /  Assessment and Plan / ED Course  I have reviewed the triage vital signs and the nursing notes.  Pertinent labs & imaging results that were available during my care of the patient were reviewed by me and considered in my medical decision making (see chart for details).     Patient has had difficulty walking and dizziness since yesterday.  Reportedly also had slurred speech.  Worsening with changing positions.  Somewhat unsteady with standing.  Not orthostatic on his vital signs.  Finger-nose potentially off a little on right side.  Not a TPA candidate due to time of onset yesterday.  Will admit to hospitalist for stroke rule out.  Final Clinical Impressions(s) / ED Diagnoses   Final diagnoses:  Dizziness  Difficulty speaking    ED Discharge Orders    None       Davonna Belling, MD 12/16/17 1229

## 2017-12-16 NOTE — ED Notes (Addendum)
Urine culture was sent for this patient to hold in lab, if needed.

## 2017-12-16 NOTE — Progress Notes (Signed)
Dr. Leonel Ramsay with neurology paged per orders to notify pt arrived to floor from [facility]

## 2017-12-16 NOTE — Telephone Encounter (Signed)
  Reason for Disposition . Stroke suspected (e.g., sudden onset of weakness of the face, arm or leg on one side of the body)  Answer Assessment - Initial Assessment Questions 1. ACUTE COMPLAINT: "What is the main problem?" "Tell me what happened?"     Husband came home last night body aches- weakness and slurred speech. He is no better this morning.  Wife is calling to report he is no better- states she will have to help him to the car- advised her to call 911 now.  Protocols used: [id number]

## 2017-12-16 NOTE — ED Triage Notes (Signed)
Patient here with complaints of difficulty walking since yesterday. Report restless legs and generalized body aches. Reports slurred speech. Patient speaking in complete sentences and ambulatory with dizziness. No facial droop noted.

## 2017-12-16 NOTE — ED Notes (Signed)
ED Provider at bedside. 

## 2017-12-17 ENCOUNTER — Encounter: Payer: Self-pay | Admitting: Physician Assistant

## 2017-12-17 ENCOUNTER — Encounter: Payer: Self-pay | Admitting: Cardiothoracic Surgery

## 2017-12-17 ENCOUNTER — Encounter (HOSPITAL_COMMUNITY): Payer: Self-pay | Admitting: *Deleted

## 2017-12-17 ENCOUNTER — Other Ambulatory Visit: Payer: Self-pay

## 2017-12-17 ENCOUNTER — Observation Stay (HOSPITAL_COMMUNITY): Payer: BLUE CROSS/BLUE SHIELD

## 2017-12-17 ENCOUNTER — Other Ambulatory Visit (HOSPITAL_COMMUNITY): Payer: BLUE CROSS/BLUE SHIELD

## 2017-12-17 ENCOUNTER — Observation Stay (HOSPITAL_BASED_OUTPATIENT_CLINIC_OR_DEPARTMENT_OTHER): Payer: BLUE CROSS/BLUE SHIELD

## 2017-12-17 DIAGNOSIS — Z79899 Other long term (current) drug therapy: Secondary | ICD-10-CM | POA: Diagnosis not present

## 2017-12-17 DIAGNOSIS — Z87891 Personal history of nicotine dependence: Secondary | ICD-10-CM | POA: Diagnosis not present

## 2017-12-17 DIAGNOSIS — J449 Chronic obstructive pulmonary disease, unspecified: Secondary | ICD-10-CM | POA: Diagnosis present

## 2017-12-17 DIAGNOSIS — Z886 Allergy status to analgesic agent status: Secondary | ICD-10-CM | POA: Diagnosis not present

## 2017-12-17 DIAGNOSIS — G459 Transient cerebral ischemic attack, unspecified: Secondary | ICD-10-CM

## 2017-12-17 DIAGNOSIS — G8929 Other chronic pain: Secondary | ICD-10-CM | POA: Diagnosis present

## 2017-12-17 DIAGNOSIS — Z91041 Radiographic dye allergy status: Secondary | ICD-10-CM | POA: Diagnosis not present

## 2017-12-17 DIAGNOSIS — I110 Hypertensive heart disease with heart failure: Secondary | ICD-10-CM | POA: Diagnosis present

## 2017-12-17 DIAGNOSIS — M1990 Unspecified osteoarthritis, unspecified site: Secondary | ICD-10-CM | POA: Diagnosis present

## 2017-12-17 DIAGNOSIS — I10 Essential (primary) hypertension: Secondary | ICD-10-CM | POA: Diagnosis not present

## 2017-12-17 DIAGNOSIS — Z7984 Long term (current) use of oral hypoglycemic drugs: Secondary | ICD-10-CM | POA: Diagnosis not present

## 2017-12-17 DIAGNOSIS — N400 Enlarged prostate without lower urinary tract symptoms: Secondary | ICD-10-CM | POA: Diagnosis present

## 2017-12-17 DIAGNOSIS — Z7982 Long term (current) use of aspirin: Secondary | ICD-10-CM | POA: Diagnosis not present

## 2017-12-17 DIAGNOSIS — E119 Type 2 diabetes mellitus without complications: Secondary | ICD-10-CM | POA: Diagnosis present

## 2017-12-17 DIAGNOSIS — I2510 Atherosclerotic heart disease of native coronary artery without angina pectoris: Secondary | ICD-10-CM | POA: Diagnosis present

## 2017-12-17 DIAGNOSIS — Z951 Presence of aortocoronary bypass graft: Secondary | ICD-10-CM | POA: Diagnosis not present

## 2017-12-17 DIAGNOSIS — R29700 NIHSS score 0: Secondary | ICD-10-CM | POA: Diagnosis present

## 2017-12-17 DIAGNOSIS — I639 Cerebral infarction, unspecified: Secondary | ICD-10-CM | POA: Diagnosis present

## 2017-12-17 DIAGNOSIS — I5032 Chronic diastolic (congestive) heart failure: Secondary | ICD-10-CM | POA: Diagnosis present

## 2017-12-17 DIAGNOSIS — E785 Hyperlipidemia, unspecified: Secondary | ICD-10-CM | POA: Diagnosis present

## 2017-12-17 DIAGNOSIS — R479 Unspecified speech disturbances: Secondary | ICD-10-CM | POA: Diagnosis present

## 2017-12-17 DIAGNOSIS — E669 Obesity, unspecified: Secondary | ICD-10-CM | POA: Diagnosis present

## 2017-12-17 DIAGNOSIS — R42 Dizziness and giddiness: Secondary | ICD-10-CM | POA: Diagnosis not present

## 2017-12-17 DIAGNOSIS — Z6839 Body mass index (BMI) 39.0-39.9, adult: Secondary | ICD-10-CM | POA: Diagnosis not present

## 2017-12-17 DIAGNOSIS — G4733 Obstructive sleep apnea (adult) (pediatric): Secondary | ICD-10-CM | POA: Diagnosis present

## 2017-12-17 DIAGNOSIS — F329 Major depressive disorder, single episode, unspecified: Secondary | ICD-10-CM | POA: Diagnosis present

## 2017-12-17 LAB — LIPID PANEL
Cholesterol: 164 mg/dL (ref 0–200)
HDL: 41 mg/dL (ref >40)
LDL Cholesterol: 105 mg/dL — ABNORMAL HIGH (ref 0–99)
Total CHOL/HDL Ratio: 4 RATIO
Triglycerides: 91 mg/dL (ref <150)
VLDL: 18 mg/dL (ref 0–40)

## 2017-12-17 LAB — GLUCOSE, CAPILLARY
Glucose-Capillary: 109 mg/dL — ABNORMAL HIGH (ref 65–99)
Glucose-Capillary: 108 mg/dL — ABNORMAL HIGH (ref 65–99)
Glucose-Capillary: 121 mg/dL — ABNORMAL HIGH (ref 65–99)
Glucose-Capillary: 150 mg/dL — ABNORMAL HIGH (ref 65–99)

## 2017-12-17 LAB — ECHOCARDIOGRAM COMPLETE
Height: 72 in
Weight: 4701.97 oz

## 2017-12-17 LAB — HEMOGLOBIN A1C
Hgb A1c MFr Bld: 5.5 % (ref 4.8–5.6)
Mean Plasma Glucose: 111.15 mg/dL

## 2017-12-17 LAB — HIV ANTIBODY (ROUTINE TESTING W REFLEX): HIV Screen 4th Generation wRfx: NONREACTIVE

## 2017-12-17 MED ORDER — ATORVASTATIN CALCIUM 80 MG PO TABS
80.0000 mg | ORAL_TABLET | Freq: Every day | ORAL | Status: DC
  Administered 2017-12-17: 80 mg via ORAL
  Filled 2017-12-17: qty 1

## 2017-12-17 NOTE — Evaluation (Signed)
Speech Language Pathology Evaluation Patient Details Name: Jonathan Miles MRN: [id number] DOB: 06/06/1961 Today's Date: 12/17/2017 Time: 2202-5427 SLP Time Calculation (min) (ACUTE ONLY): 16 min  Problem List:  Patient Active Problem List   Diagnosis Date Noted  . TIA (transient ischemic attack) 12/16/2017  . Difficulty speaking   . Dizziness   . BMI 36.0-36.9,adult 08/05/2017  . Cholelithiasis 08/05/2017  . Bilateral plantar fasciitis 08/05/2017  . Situational mixed anxiety and depressive disorder 08/05/2017  . Bilateral primary osteoarthritis of knee 07/01/2017  . Asthma 01/26/2017  . Overactive bladder 01/21/2017  . BPH with obstruction/lower urinary tract symptoms 01/21/2017  . S/P appendectomy 01/07/2017  . Diverticulosis 11/24/2016  . Type 2 diabetes mellitus without complication, without long-term current use of insulin ([place]) 10/31/2016  . Aortic insufficiency 11/23/2012  . Chronic diastolic CHF (congestive heart failure) ([place]) 11/23/2012  . CAD (coronary artery disease) 11/27/2011  . OSA (obstructive sleep apnea) 11/11/2011  . Aortic root dilatation ([place])   . Hyperlipidemia 01/23/2011  . Hypertension 01/23/2011   Past Medical History:  Past Medical History:  Diagnosis Date  . Alcohol abuse   . Aortic root dilatation (HCC)    a. echo 4/12: EF 55-60%, mod to marked dilated ascending aortic root;   b. MRA 4/12: Aortic root 5.5 cm, dilation of left and right coronary cusps, trileaflet aortic valve  . Arthritis   . Asthma    ast attack in early 20's  . CAD (coronary artery disease)   . Chest pain    Myoview 4/12: EF 62%, no ischemia or scar  . CHF (congestive heart failure) ([place])   . Chronic pain    back and hands  . Constipation   . COPD (chronic obstructive pulmonary disease) ([place])   . Depression   . Diabetes mellitus    type 2  . Drug use   . Enlarged prostate   . Fatigue   . GERD (gastroesophageal reflux disease)    modifies with diet  . Hx of  echocardiogram    Echo (2/16):  Mod LVH, EF 55-60%, mild to mod AI, mod LAE, mild RAE, Aortic root 38 mm, Asc aorta 43 mm  . Hyperlipidemia   . Hypertension   . Joint pain   . Knee pain, bilateral   . OSA (obstructive sleep apnea) 2006   .  Wears at times  . Osteoarthritis   . Palpitations   . PONV (postoperative nausea and vomiting)   . SOB (shortness of breath)    Past Surgical History:  Past Surgical History:  Procedure Laterality Date  . APPENDECTOMY  12/2016  . Colonscopy    . CORONARY ARTERY BYPASS GRAFT  05/07/2012   Procedure: CORONARY ARTERY BYPASS GRAFTING (CABG);  Surgeon: Grace Isaac, MD;  Location: [place];  Service: Open Heart Surgery;  Laterality: N/A;  . KNEE ARTHROSCOPY Left 2010   Dr. Rosamaria Lints  . LAPAROSCOPIC APPENDECTOMY N/A 01/06/2017   Procedure: APPENDECTOMY LAPAROSCOPIC;  Surgeon: Ralene Ok, MD;  Location: [place] [facility];  Service: General;  Laterality: N/A;  . Retactment of fingers     as a child , sewed with sewing machine- Index and middle finger  . UPPER GASTROINTESTINAL ENDOSCOPY     HPI:  57 y.o. male with medical history significant of coronary artery disease, congestive heart failure, COPD, diabetes comes in with over 1 day of dizziness and slurred speech. MRI + 1 cm acute infarct left paramedian pons.    Assessment / Plan / Recommendation Clinical Impression  Pt presents  with intact cognition and expressive/receptive language, consistent with neuropathology.  Mild right facial asymmetry is present.  Speech is fully intelligible, and 90+% of normal per his wife.  Pt verbalized stroke risk factors reviewed with him by OT.  No SLP needs are identified - our services will sign off.     SLP Assessment  SLP Recommendation/Assessment: Patient does not need any further Speech Lanaguage Pathology Services    Follow Up Recommendations  None    Frequency and Duration           SLP Evaluation Cognition  Overall Cognitive Status: Within Functional  Limits for tasks assessed Arousal/Alertness: Awake/alert Orientation Level: Oriented X4 Attention: Selective Selective Attention: Appears intact Memory: Appears intact       Comprehension  Auditory Comprehension Overall Auditory Comprehension: Appears within functional limits for tasks assessed    Expression Expression Primary Mode of Expression: Verbal Verbal Expression Overall Verbal Expression: Appears within functional limits for tasks assessed Written Expression Dominant Hand: Right   Oral / Motor  Oral Motor/Sensory Function Overall Oral Motor/Sensory Function: Mild impairment Facial Symmetry: Abnormal symmetry right;Suspected CN VII (facial) dysfunction Motor Speech Overall Motor Speech: Appears within functional limits for tasks assessed   GO                    Juan Quam Laurice 12/17/2017, 3:39 PM

## 2017-12-17 NOTE — Progress Notes (Addendum)
PROGRESS NOTE  Jonathan Miles PYP:[id number] DOB: 1961/01/06 DOA: 12/16/2017 PCP: Harrison Mons, PA-C  HPI/Recap of past 24 hours: Jonathan Miles is a 57 y.o. male with medical history significant of coronary artery disease, congestive heart failure, COPD, diabetes comes in with over 1 day of dizziness and slurred speech that started yesterday at 4 PM.  He denies any vision changes.  He denies any numbness tingling or weakness anywhere.  He denies any tongue numbness or heaviness.  The dizziness is worse when he gets up and moves around.  He denies any headache.  He denies any fevers.  He denies any nausea vomiting or diarrhea.  He denies any history of vertigo.  Patient is referred for admission for possible TIA versus CVA.  Neurology at home was called who recommended transferring to come for neurological evaluation. MRI brain done on 12/16/17 revealed 1 cm acute infarct left paramedian pons.    12/17/17: seen and examined at his bedside. Feels a little short winded after working with physical therapy. 2D Echo done this am. Grade 1 diastolic dysfunction. Denies any cardiac symptoms.   Assessment/Plan: Principal Problem:   TIA (transient ischemic attack) Active Problems:   Hyperlipidemia   Hypertension   CAD (coronary artery disease)   Chronic diastolic CHF (congestive heart failure) (HCC)   Type 2 diabetes mellitus without complication, without long-term current use of insulin (HCC)   Difficulty speaking   Dizziness  Acute infarct left pons -2d echo 2/45/80 grade 1 diastolic dysfunction -ldl 105 -goal ldl <70 -A1c 5.5 -continue high dose statin -PT/OT -OT recommends outpatient OT -neurology following -continue asa, statin -neuro checks q4h -fall precaution  HLD -ldl 105 -continue statin -goal ldl less than 70  Grade 1 diastolic dysfunction -continue BB, asa statin, lasix -LVEF 55-60% (12/17/17)  Depression -continue zoloft  HTN -BP is stable -lisinopril,  coreg  Type 2 diabetes -well controlled -on metformin at home  -ISS sensitive  -avoid hypoglycemia -A1C 5.5   Code Status: full   Family Communication: none at bedside   Disposition Plan: to be determined   Consultants:  neurology  Procedures:  none  Antimicrobials:  none  DVT prophylaxis:  SCDs    Objective: Vitals:   12/17/17 0021 12/17/17 0131 12/17/17 0400 12/17/17 0755  BP: (!) 155/103  (!) 141/74 134/78  Pulse: 68  60 62  Resp: 16  18 18   Temp:   97.7 F (36.5 C) 98.6 F (37 C)  TempSrc:   Oral Oral  SpO2: 96%  94% 100%  Weight:  133.3 kg (293 lb 14 oz)    Height:  6' (1.829 m)     No intake or output data in the 24 hours ending 12/17/17 0915 Filed Weights   12/17/17 0131  Weight: 133.3 kg (293 lb 14 oz)    Exam:   General:  57 yo AAM WD WN NAD A&O x 3  Cardiovascular: RRR no rubs or gallops;   Respiratory: CTA no wheezes or rales  Abdomen: soft NT ND NBS x4   Musculoskeletal: no significant changes in strength, ambulates but slowly.  Skin: no rash noted  Psychiatry: mood appropriate for condition and setting.   Data Reviewed: CBC: Recent Labs  Lab 12/16/17 1109  WBC 8.6  NEUTROABS 6.3  HGB 13.3  HCT 39.0  MCV 85.7  PLT 998   Basic Metabolic Panel: Recent Labs  Lab 12/16/17 1109  NA 142  K 3.7  CL 105  CO2 27  GLUCOSE 114*  BUN  10  CREATININE 0.72  CALCIUM 9.3   GFR: Estimated Creatinine Clearance: 144 mL/min (by C-G formula based on SCr of 0.72 mg/dL). Liver Function Tests: Recent Labs  Lab 12/16/17 1109  AST 14*  ALT 14*  ALKPHOS 71  BILITOT 0.6  PROT 7.0  ALBUMIN 4.0   No results for input(s): LIPASE, AMYLASE in the last 168 hours. No results for input(s): AMMONIA in the last 168 hours. Coagulation Profile: Recent Labs  Lab 12/16/17 1109  INR 1.01   Cardiac Enzymes: No results for input(s): CKTOTAL, CKMB, CKMBINDEX, TROPONINI in the last 168 hours. BNP (last 3 results) No results for  input(s): PROBNP in the last 8760 hours. HbA1C: Recent Labs    12/17/17 0639  HGBA1C 5.5   CBG: Recent Labs  Lab 12/16/17 1551 12/17/17 0611  GLUCAP 80 109*   Lipid Profile: Recent Labs    12/17/17 0639  CHOL 164  HDL 41  LDLCALC 105*  TRIG 91  CHOLHDL 4.0   Thyroid Function Tests: No results for input(s): TSH, T4TOTAL, FREET4, T3FREE, THYROIDAB in the last 72 hours. Anemia Panel: No results for input(s): VITAMINB12, FOLATE, FERRITIN, TIBC, IRON, RETICCTPCT in the last 72 hours. Urine analysis:    Component Value Date/Time   COLORURINE YELLOW 12/16/2017 1109   APPEARANCEUR CLEAR 12/16/2017 1109   LABSPEC 1.010 12/16/2017 1109   PHURINE 7.0 12/16/2017 1109   GLUCOSEU NEGATIVE 12/16/2017 1109   [place] 12/16/2017 1109   [place] 12/16/2017 1109   KETONESUR NEGATIVE 12/16/2017 1109   PROTEINUR NEGATIVE 12/16/2017 1109   UROBILINOGEN 1.0 05/13/2012 1018   NITRITE NEGATIVE 12/16/2017 1109   LEUKOCYTESUR NEGATIVE 12/16/2017 1109   Sepsis Labs: @LABRCNTIP (procalcitonin:4,lacticidven:4)  )No results found for this or any previous visit (from the past 240 hour(s)).    Studies: Dg Chest 2 View  Result Date: 12/16/2017 CLINICAL DATA:  Bilateral lower extremity weakness and dizziness since 12/15/2017. EXAM: CHEST  2 VIEW COMPARISON:  CT chest 10/15/2015.  PA and lateral chest 10/07/2012. FINDINGS: The lungs are clear. Heart size is normal. No pneumothorax or pleural effusion. The patient is status post CABG. No acute bony abnormality. IMPRESSION: No acute disease. Electronically Signed   By: Inge Rise M.D.   On: 12/16/2017 11:39   Ct Head Wo Contrast  Result Date: 12/16/2017 CLINICAL DATA:  Ataxia.  Slurred speech.  Dizziness. EXAM: CT HEAD WITHOUT CONTRAST TECHNIQUE: Contiguous axial images were obtained from the base of the skull through the vertex without intravenous contrast. COMPARISON:  None. FINDINGS: Brain: There is no evidence of acute  infarct, intracranial hemorrhage, mass, midline shift, or extra-axial fluid collection. The ventricles and sulci are normal. Subcortical greater than periventricular white matter hypodensities are nonspecific but compatible with mild chronic small vessel ischemic disease. Vascular: Calcified atherosclerosis at the skull base. No hyperdense vessel. Skull: No fracture or focal osseous lesion. Sinuses/Orbits: Visualized paranasal sinuses and mastoid air cells are clear. Orbits are unremarkable. Other: None. IMPRESSION: 1. No evidence of acute intracranial abnormality. 2. Mild chronic small vessel ischemic disease. Electronically Signed   By: Logan Bores M.D.   On: 12/16/2017 11:47   [name]  Result Date: 12/16/2017 CLINICAL DATA:  Neuro deficit. Headache with difficulty walking. Weakness and slurred speech. EXAM: MRI HEAD WITHOUT CONTRAST TECHNIQUE: Multiplanar, multiecho pulse sequences of the brain and surrounding structures were obtained without intravenous contrast. COMPARISON:  CT head 12/16/2017 FINDINGS: Brain: 1 cm acute infarct left paramedian pons. No other acute infarct. Scattered white matter  hyperintensities bilaterally compatible with chronic microvascular ischemia. Negative for hemorrhage or mass. Ventricle size normal. Vascular: Normal arterial flow voids Skull and upper cervical spine: Negative Sinuses/Orbits: Negative Other: None IMPRESSION: 1 cm acute infarct left pons Mild to moderate chronic microvascular ischemic changes in the cerebral white matter bilaterally. Electronically Signed   By: Franchot Gallo M.D.   On: 12/16/2017 14:33    Scheduled Meds: .  stroke: mapping our early stages of recovery book   Does not apply Once  . aspirin EC  325 mg Oral QHS  . atorvastatin  80 mg Oral q1800  . carvedilol  12.5 mg Oral BID  . felodipine  10 mg Oral Daily  . furosemide  40 mg Oral Daily  . insulin aspart  0-9 Units Subcutaneous TID WC  . lisinopril  20 mg Oral Daily  .  sertraline  100 mg Oral Daily    Continuous Infusions:   LOS: 0 days     Kayleen Memos, MD [facility] Pager [phone]  If 7PM-7AM, please contact night-coverage [email or web address] Password [email or web address] 12/17/2017, 9:15 AM

## 2017-12-17 NOTE — Progress Notes (Signed)
*  PRELIMINARY RESULTS* Vascular Ultrasound Carotid Duplex (Doppler) has been completed.   Findings suggest 1-39% internal carotid artery stenosis bilaterally. Vertebral arteries are patent with antegrade flow.  12/17/2017 10:22 AM Maudry Mayhew, BS, RVT, RDCS, RDMS

## 2017-12-17 NOTE — Progress Notes (Signed)
  Echocardiogram 2D Echocardiogram has been performed.  Jonathan Miles 12/17/2017, 9:39 AM

## 2017-12-17 NOTE — Consult Note (Addendum)
Requesting Physician: Dr. Nevada Crane    Chief Complaint: Stroke  History obtained from:  Patient     HPI:                                                                                                                                         Jonathan Miles is an 57 y.o. male presented to the hospital with dizziness slurred speech and difficulty with his gait.  Patient states he takes a baby aspirin a day however he was at [facility] secondary to his daughter having birth and did not take 3 doses of his aspirin.  On the 19th he noted that he was having difficulty with dizziness and gait instability.  For that reason patient came to the emergency department.  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  Patient does have a history of hyperlipidemia, hypertension, diabetes.  Patient does not smoke.  Neurology was asked to see due to stroke.  Date last known well: Date: 12/15/2017 Time last known well: Time: 16:00 tPA Given: No: Out of the window Modified Rankin: Rankin Score=0  Past Medical History:  Diagnosis Date  . Alcohol abuse   . Aortic root dilatation (HCC)    a. echo 4/12: EF 55-60%, mod to marked dilated ascending aortic root;   b. MRA 4/12: Aortic root 5.5 cm, dilation of left and right coronary cusps, trileaflet aortic valve  . Arthritis   . Asthma    ast attack in early 20's  . CAD (coronary artery disease)   . Chest pain    Myoview 4/12: EF 62%, no ischemia or scar  . CHF (congestive heart failure) ([place])   . Chronic pain    back and hands  . Constipation   . COPD (chronic obstructive pulmonary disease) ([employer])   . Depression   . Diabetes mellitus    type 2  . Drug use   . Enlarged prostate   . Fatigue   . GERD (gastroesophageal reflux disease)    modifies with diet  . Hx of echocardiogram    Echo (2/16):  Mod LVH, EF 55-60%, mild to mod AI, mod LAE, mild RAE, Aortic root 38 mm, Asc aorta 43 mm  . Hyperlipidemia   . Hypertension   . Joint pain   . Knee pain,  bilateral   . OSA (obstructive sleep apnea) 2006   .  Wears at times  . Osteoarthritis   . Palpitations   . PONV (postoperative nausea and vomiting)   . SOB (shortness of breath)     Past Surgical History:  Procedure Laterality Date  . APPENDECTOMY  12/2016  . Colonscopy    . CORONARY ARTERY BYPASS GRAFT  05/07/2012   Procedure: CORONARY ARTERY BYPASS GRAFTING (CABG);  Surgeon: Grace Isaac, MD;  Location: [facility];  Service: Open Heart Surgery;  Laterality: N/A;  . KNEE ARTHROSCOPY Left 2010  Dr. Rosamaria Lints  . LAPAROSCOPIC APPENDECTOMY N/A 01/06/2017   Procedure: APPENDECTOMY LAPAROSCOPIC;  Surgeon: Ralene Ok, MD;  Location: [facility];  Service: General;  Laterality: N/A;  . Retactment of fingers     as a child , sewed with sewing machine- Index and middle finger  . UPPER GASTROINTESTINAL ENDOSCOPY      Family History  Adopted: Yes  Family history unknown: Yes   Social History:  reports that he quit smoking about 29 years ago. His smoking use included cigarettes. He has a 13.00 pack-year smoking history. he has never used smokeless tobacco. He reports that he drinks alcohol. He reports that he does not use drugs.  Allergies:  Allergies  Allergen Reactions  . Ibuprofen Other (See Comments)    TOLD NOT TO TAKE-PER CARDS  . Iodinated Diagnostic Agents Hives    Hives started 4 days after cta chest was performed, minimal relief w/ benadryl x 3 days, uncertain if reaction was actually iv contrast or other allergen,allergy tests to follow.wife will make [place] aware of results//a.calhoun    Medications:                                                                                                                           Scheduled: .  stroke: mapping our early stages of recovery book   Does not apply Once  . aspirin EC  325 mg Oral QHS  . atorvastatin  80 mg Oral q1800  . carvedilol  12.5 mg Oral BID  . felodipine  10 mg Oral Daily  . furosemide  40 mg Oral Daily  . insulin  aspart  0-9 Units Subcutaneous TID WC  . lisinopril  20 mg Oral Daily  . sertraline  100 mg Oral Daily    ROS:                                                                                                                                       History obtained from the patient  General ROS: negative for - chills, fatigue, fever, night sweats, weight gain or weight loss Psychological ROS: negative for - , hallucinations, memory difficulties, mood swings or  Ophthalmic ROS: negative for - blurry vision, double vision, eye pain or loss of vision ENT ROS: negative for - epistaxis, nasal discharge, oral lesions, sore throat, tinnitus or vertigo Respiratory ROS: negative for - cough,  shortness of breath or wheezing Cardiovascular ROS: negative for - chest pain, dyspnea on exertion,  Gastrointestinal ROS: negative for - abdominal pain, diarrhea,  nausea/vomiting or stool incontinence Genito-Urinary ROS: negative for - dysuria, hematuria, incontinence or urinary frequency/urgency Musculoskeletal ROS: negative for - joint swelling or muscular weakness Neurological ROS: as noted in HPI   General Examination:                                                                                                      Blood pressure (!) 147/91, pulse 62, temperature 98.6 F (37 C), temperature source Oral, resp. rate 18, height 6' (1.829 m), weight 133.3 kg (293 lb 14 oz), SpO2 100 %.  HEENT-  Normocephalic, no lesions, without obvious abnormality.  Normal external eye and conjunctiva.   Cardiovascular- S1-S2 audible, pulses palpable throughout   Lungs-no rhonchi or wheezing noted, no excessive working breathing.  Saturations within normal limits Abdomen- All 4 quadrants palpated and nontender Extremities- Warm, dry and intact Musculoskeletal-no joint tenderness, deformity or swelling Skin-warm and dry, no hyperpigmentation, vitiligo, or suspicious lesions  Neurological Examination Mental  Status: Alert, oriented, thought content appropriate.  Speech dysarthric without evidence of aphasia.  Able to follow 3 step commands without difficulty. Cranial Nerves: II:  Visual fields grossly normal,  III,IV, VI: ptosis not present, extra-ocular motions intact bilaterally, pupils equal, round, reactive to light and accommodation V,VII: smile symmetric, facial light touch sensation normal bilaterally VIII: hearing normal bilaterally IX,X: uvula rises symmetrically XI: bilateral shoulder shrug XII: midline tongue extension Motor: Right : Upper extremity   4/5    Left:     Upper extremity   5/5  Lower extremity   5/5     Lower extremity   5/5 Tone and bulk:normal tone throughout; no atrophy noted Sensory: Pinprick and light touch intact throughout, bilaterally Deep Tendon Reflexes: 2+ and symmetric throughout Plantars: Right: downgoing   Left: downgoing Cerebellar: normal finger-to-nose,  and normal heel-to-shin test Gait: Not tested awaiting PT evaluation   Lab Results: Basic Metabolic Panel: Recent Labs  Lab 12/16/17 1109  NA 142  K 3.7  CL 105  CO2 27  GLUCOSE 114*  BUN 10  CREATININE 0.72  CALCIUM 9.3   CBC: Recent Labs  Lab 12/16/17 1109  WBC 8.6  NEUTROABS 6.3  HGB 13.3  HCT 39.0  MCV 85.7  PLT 264   Lipid Panel: Recent Labs  Lab 12/17/17 0639  CHOL 164  TRIG 91  HDL 41  CHOLHDL 4.0  VLDL 18  LDLCALC 105*   CBG: Recent Labs  Lab 12/16/17 1551 12/17/17 0611 12/17/17 1131  GLUCAP 80 109* 108*   Imaging: Dg Chest 2 View  Result Date: 12/16/2017 CLINICAL DATA:  Bilateral lower extremity weakness and dizziness since 12/15/2017. EXAM: CHEST  2 VIEW COMPARISON:  CT chest 10/15/2015.  PA and lateral chest 10/07/2012. FINDINGS: The lungs are clear. Heart size is normal. No pneumothorax or pleural effusion. The patient is status post CABG. No acute bony abnormality. IMPRESSION: No acute disease. Electronically Signed   By: Inge Rise  M.D.   On:  12/16/2017 11:39   Ct Head Wo Contrast  Result Date: 12/16/2017 CLINICAL DATA:  Ataxia.  Slurred speech.  Dizziness. EXAM: CT HEAD WITHOUT CONTRAST TECHNIQUE: Contiguous axial images were obtained from the base of the skull through the vertex without intravenous contrast. COMPARISON:  None. FINDINGS: Brain: There is no evidence of acute infarct, intracranial hemorrhage, mass, midline shift, or extra-axial fluid collection. The ventricles and sulci are normal. Subcortical greater than periventricular white matter hypodensities are nonspecific but compatible with mild chronic small vessel ischemic disease. Vascular: Calcified atherosclerosis at the skull base. No hyperdense vessel. Skull: No fracture or focal osseous lesion. Sinuses/Orbits: Visualized paranasal sinuses and mastoid air cells are clear. Orbits are unremarkable. Other: None. IMPRESSION: 1. No evidence of acute intracranial abnormality. 2. Mild chronic small vessel ischemic disease. Electronically Signed   By: Logan Bores M.D.   On: 12/16/2017 11:47   [name]  Result Date: 12/16/2017 CLINICAL DATA:  Neuro deficit. Headache with difficulty walking. Weakness and slurred speech. EXAM: MRI HEAD WITHOUT CONTRAST TECHNIQUE: Multiplanar, multiecho pulse sequences of the brain and surrounding structures were obtained without intravenous contrast. COMPARISON:  CT head 12/16/2017 FINDINGS: Brain: 1 cm acute infarct left paramedian pons. No other acute infarct. Scattered white matter hyperintensities bilaterally compatible with chronic microvascular ischemia. Negative for hemorrhage or mass. Ventricle size normal. Vascular: Normal arterial flow voids Skull and upper cervical spine: Negative Sinuses/Orbits: Negative Other: None IMPRESSION: 1 cm acute infarct left pons Mild to moderate chronic microvascular ischemic changes in the cerebral white matter bilaterally. Electronically Signed   By: Franchot Gallo M.D.   On: 12/16/2017 14:33    *PRELIMINARY RESULTS* Vascular Ultrasound Carotid Duplex (Doppler) has been completed.   Findings suggest 1-39% internal carotid artery stenosis bilaterally. Vertebral arteries are patent with antegrade flow.  LDL 105 A1c 5.5  Assessment and plan discussed with with attending physician and they are in agreement.    Kainoa Swoboda PA-C [facility] [phone]  12/17/2017, 11:43 AM   Assessment: 57 y.o. male with acute onset of left pontine infarct. Likely small vessel etiology.  Stroke Risk Factors - diabetes mellitus, hyperlipidemia and hypertension  Recommend 1 LDL and A1c of [place] been obtained 2.MRA of the brain without contrast 3. PT consult, OT consult, Speech consult 4. Echocardiogram--awaiting final reading 5. 80 mg of Atorvistatin 6. Prophylactic therapy-Antiplatelet med: Continue aspirin while in hospital 7. Risk factor modification 8. Telemetry monitoring 9. Frequent neuro checks 10 NPO until passes stroke swallow screen 11 please page stroke NP  Or  PA  Or MD from 8am -4 pm  as this patient from this time will be  followed by the stroke.   You can look them up on [email or web address]  [place]  Attending Neurohospitalist Addendum Patient seen and examined with APP/Resident. Agree with the history and physical as documented above. Agree with the plan as documented, which I helped formulate. I have independently reviewed the chart, obtained history, review of systems and examined the patient.I have personally reviewed pertinent head/neck/spine imaging (CT/MRI). Please feel free to call with any questions. --- Amie Portland, MD [facility] Pager: [phone]  If 7pm to 7am, please call on call as listed on AMION.

## 2017-12-17 NOTE — Progress Notes (Signed)
OT Cancellation Note  Patient Details Name: Jonathan Miles MRN: [id number] DOB: 24-Jun-1961   Cancelled Treatment:    Reason Eval/Treat Not Completed: Patient at procedure or test/ unavailable(echo)  [place], OT/L  [phone] 12/17/2017 12/17/2017, 8:40 AM

## 2017-12-17 NOTE — Progress Notes (Signed)
Occupational Therapy Evaluation Patient Details Name: Jonathan Miles MRN: [id number] DOB: [birth year] Today's Date: 12/17/2017    History of Present Illness 57 y.o. male with medical history significant of coronary artery disease, congestive heart failure, COPD, diabetes comes in with over 1 day of dizziness and slurred speech. MRI + 1 cm acute infarct left paramedian pons.    Clinical Impression   PTA, pt lived at home with his wife, worked in [place] with [employer], drove and played in on Engineer, civil (consulting). Pt reports that his symptoms feel that they are improving but that hie is not at his baseline. Pt states he feels as if he "is on a boat". Pt with imoroved mobility with use of RW and requires overall set up with ADL tasks. At this time, recommend pt follow up with neuro outpt OT to facilitate safe return to work and IADL tasks. Will follow acutely to facilitate safe DC home with wife. Educated pt on warning wigns and symptoms of CVA using BeFast. Pt also discussed various stressors in his life and the relation of stress to the occurrence of his CVA. Listened to pt and encouraged him to seek counseling if needed. Pt verbalized understanding. Will follow acutely.    Follow Up Recommendations  Outpatient OT;Supervision - Intermittent    Equipment Recommendations  None recommended by OT    Recommendations for Other Services       Precautions / Restrictions Precautions Precautions: Fall      Mobility Bed Mobility Overal bed mobility: Modified Independent                Transfers Overall transfer level: Needs assistance   Transfers: Sit to/from Stand;Stand Pivot Transfers Sit to Stand: Supervision Stand pivot transfers: Min guard       General transfer comment: No LOB noted with use of external support    Balance Overall balance assessment: Needs assistance   Sitting balance-Leahy Scale: Good       Standing balance-Leahy Scale:  Fair Standing balance comment: prefers to use extenal support                           ADL either performed or assessed with clinical judgement   ADL Overall ADL's : Needs assistance/impaired     Grooming: Set up   Upper Body Bathing: Set up;Sitting   Lower Body Bathing: Set up;Sit to/from stand   Upper Body Dressing : Set up;Sitting   Lower Body Dressing: Set up;Sit to/from stand   Toilet Transfer: Chief of Staff Details (indicate cue type and reason): stood to urinate; did not sit on commode due to hx of knee problems and low toilet Toileting- Clothing Manipulation and Hygiene: Modified independent       Functional mobility during ADLs: Min guard;Rolling walker General ADL Comments: States he feels like he is "on a boat"     Vision Baseline Vision/History: Wears glasses Patient Visual Report: Blurring of vision Additional Comments: Pt states vision is returning to baseline; able to read     Perception     Praxis Praxis Praxis tested?: Within functional limits    Pertinent Vitals/Pain Pain Assessment: 0-10 Pain Score: 3  Pain Location: Head na dknees Pain Descriptors / Indicators: Aching;Discomfort Pain Intervention(s): Limited activity within patient's tolerance     Hand Dominance Right   Extremity/Trunk Assessment Upper Extremity Assessment Upper Extremity Assessment: Overall WFL for tasks assessed   Lower Extremity Assessment Lower Extremity Assessment: Defer to  PT evaluation   Cervical / Trunk Assessment Cervical / Trunk Assessment: Normal   Communication Communication Communication: Expressive difficulties(Pt states he "slurs his works a little")   Cognition Arousal/Alertness: Awake/alert Behavior During Therapy: WFL for tasks assessed/performed Overall Cognitive Status: Within Functional Limits for tasks assessed                                     General Comments   Pt discussing life  stressors, including changes at work and dealing with depression. Encouraged pt to seek counseling/increase his support system; stat.hiles he talks with his wife but that he would consider counseling    Exercises     Shoulder Instructions      Home Living Family/patient expects to be discharged to:: Private residence Living Arrangements: Spouse/significant other Available Help at Discharge: Family;Available 24 hours/day Type of Home: House Home Access: Stairs to enter [employer] of Steps: 5 Entrance Stairs-Rails: Right;Left;Can reach both Home Layout: Two level;1/2 bath on main level;Bed/bath upstairs Alternate Level Stairs-Number of Steps: flight   Bathroom Shower/Tub: Tub/shower unit;Curtain   Bathroom Toilet: Standard Bathroom Accessibility: Yes How Accessible: Accessible via walker Home Equipment: [place] - 2 wheels;Bedside commode;Grab bars - tub/shower;Crutches(wide RW)          Prior Functioning/Environment Level of Independence: Independent        Comments: 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[employer]; enjoys bowling (league); "[employer] for his wife"        OT Problem List: Decreased activity tolerance;Decreased knowledge of use of DME or AE;Obesity;Impaired balance (sitting and/or standing)      OT Treatment/Interventions: Self-care/ADL training;Neuromuscular education;DME and/or AE instruction;Splinting;Therapeutic activities;Patient/family education;Balance training    OT Goals(Current goals can be found in the care plan section) Acute Rehab OT Goals Patient Stated Goal: to return to mormal OT Goal Formulation: With patient Time For Goal Achievement: 12/31/17 Potential to Achieve Goals: Good  OT Frequency: Min 2X/week   Barriers to D/C:            Co-evaluation              AM-PAC PT "6 Clicks" Daily Activity     Outcome Measure Help from another person eating meals?: None Help from another person taking care of personal  grooming?: None Help from another person toileting, which includes using toliet, bedpan, or urinal?: None Help from another person bathing (including washing, rinsing, drying)?: None Help from another person to put on and taking off regular upper body clothing?: None Help from another person to put on and taking off regular lower body clothing?: A Little 6 Click Score: 23   End of Session Equipment Utilized During Treatment: Gait belt;Rolling walker Nurse Communication: Mobility status  Activity Tolerance: Patient tolerated treatment well Patient left: in chair;with call bell/phone within reach;with family/visitor present  OT Visit Diagnosis: Unsteadiness on feet (R26.81);Dizziness and giddiness (R42)                Time: 8185-6314 OT Time Calculation (min): 35 min Charges:    G-CodesMaurie Boettcher, OT/L  [phone] 12/17/2017  12/17/2017, 1:14 PM

## 2017-12-17 NOTE — Evaluation (Signed)
Physical Therapy Evaluation Patient Details Name: Jonathan Miles MRN: [id number] DOB: 15-Jan-1961 Today's Date: 12/17/2017   History of Present Illness  57 y.o. male with medical history significant of coronary artery disease, congestive heart failure, COPD, diabetes comes in with over 1 day of dizziness and slurred speech. MRI + 1 cm acute infarct left paramedian pons.   Clinical Impression  Patient presents with decreased independence with mobility due to general weakness, decreased balance, dizziness, and limited activity tolerance.  He will benefit from skilled PT in the acute setting to allow d/c home with wife assist and follow up outpatient PT at d/c.     Follow Up Recommendations Outpatient PT(neurorehabilitation)    Equipment Recommendations  None recommended by PT    Recommendations for Other Services       Precautions / Restrictions Precautions Precautions: Fall      Mobility  Bed Mobility               General bed mobility comments: up in chair  Transfers Overall transfer level: Needs assistance   Transfers: Sit to/from Stand Sit to Stand: Min guard;Supervision         General transfer comment: initially minguard for safety, then with S use of armrests  Ambulation/Gait Ambulation/Gait assistance: Min guard;Supervision Ambulation Distance (Feet): 200 Feet Assistive device: Rolling walker (2 wheeled) Gait Pattern/deviations: Step-through pattern;Decreased stride length;Trunk flexed     General Gait Details: cues for forward gaze due to c/o dizziness, assist for balance, was able to walk in room without device, but mild increase in dizziness and imbalance  Stairs            Wheelchair Mobility    Modified Rankin (Stroke Patients Only) Modified Rankin (Stroke Patients Only) Pre-Morbid Rankin Score: No symptoms Modified Rankin: Moderately severe disability     Balance Overall balance assessment: Needs assistance   Sitting balance-Leahy  Scale: Good       Standing balance-Leahy Scale: Fair                   Standardized Balance Assessment Standardized Balance Assessment : Berg Balance Test Berg Balance Test Sit to Stand: Able to stand  independently using hands Standing Unsupported: Able to stand safely 2 minutes Sitting with Back Unsupported but Feet Supported on Floor or Stool: Able to sit safely and securely 2 minutes Stand to Sit: Controls descent by using hands Transfers: Able to transfer safely, minor use of hands Standing Unsupported with Eyes Closed: Able to stand 10 seconds with supervision Standing Ubsupported with Feet Together: Needs help to attain position but able to stand for 30 seconds with feet together From Standing, Reach Forward with Outstretched Arm: Can reach forward >12 cm safely (5") From Standing Position, Pick up Object from Floor: Able to pick up shoe, needs supervision From Standing Position, Turn to Look Behind Over each Shoulder: Looks behind from both sides and weight shifts well Turn 360 Degrees: Needs close supervision or verbal cueing Standing Unsupported, Alternately Place Feet on Step/Stool: Able to complete >2 steps/needs minimal assist Standing Unsupported, One Foot in Front: Needs help to step but can hold 15 seconds Standing on One Leg: Tries to lift leg/unable to hold 3 seconds but remains standing independently Total Score: 36         Pertinent Vitals/Pain Pain Assessment: Faces Faces Pain Scale: Hurts a little bit Pain Location: headache and knees Pain Descriptors / Indicators: Aching;Discomfort Pain Intervention(s): Monitored during session    Home Living Family/patient expects to  be discharged to:: Private residence Living Arrangements: Spouse/significant other Available Help at Discharge: Family;Available 24 hours/day Type of Home: House Home Access: Stairs to enter Entrance Stairs-Rails: Right;Left;Can reach both Entrance Stairs-Number of Steps: 5 Home  Layout: Two level;1/2 bath on main level;Bed/bath upstairs Home Equipment: Walker - 2 wheels;Bedside commode;Grab bars - tub/shower;Crutches(bari RW)      Prior Function Level of Independence: Independent         Comments: 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[employer]; enjoys bowling (league); "Mr Jonathan Miles It for his wife"     Hand Dominance   Dominant Hand: Right    Extremity/Trunk Assessment   Upper Extremity Assessment Upper Extremity Assessment: Defer to OT evaluation    Lower Extremity Assessment Lower Extremity Assessment: Generalized weakness(grossly hip flexion 4-/5, knee extension 4-/5 ankle DF 4-/5)       Communication   Communication: Expressive difficulties(mild dysarthria)  Cognition Arousal/Alertness: Awake/alert Behavior During Therapy: WFL for tasks assessed/performed Overall Cognitive Status: Within Functional Limits for tasks assessed                                        General Comments    Vestibular Assessment - 12/17/17 1603      Symptom Behavior   Type of Dizziness  "Funny feeling in head"    Duration of Dizziness  minutes    Aggravating Factors  Activity in general    Relieving Factors  Rest      Occulomotor Exam   Occulomotor Alignment  Abnormal L eye lower in orbit    Spontaneous  Absent    Gaze-induced  Absent    Smooth Pursuits  Intact    Saccades  Intact      Vestibulo-Occular Reflex   VOR 1 Head Only (x 1 viewing)  30 sec horizontal and vertical     VOR Cancellation  Normal         Exercises Other Exercises Other Exercises: habituation exercises, sit<> stand x 5 and head turns x 5   Assessment/Plan    PT Assessment Patient needs continued PT services  PT Problem List Decreased mobility;Decreased safety awareness;Decreased knowledge of precautions;Decreased activity tolerance;Decreased balance;Decreased knowledge of use of DME       PT Treatment Interventions DME instruction;Functional mobility  training;Balance training;Patient/family education;Gait training;Therapeutic activities;Neuromuscular re-education;Therapeutic exercise;Stair training;Other (comment)(vestibular rehab)    PT Goals (Current goals can be found in the Care Plan section)  Acute Rehab PT Goals Patient Stated Goal: to return to mormal PT Goal Formulation: With patient Time For Goal Achievement: 12/31/17 Potential to Achieve Goals: Good    Frequency Min 4X/week   Barriers to discharge        Co-evaluation               AM-PAC PT "6 Clicks" Daily Activity  Outcome Measure Difficulty turning over in bed (including adjusting bedclothes, sheets and blankets)?: None Difficulty moving from lying on back to sitting on the side of the bed? : None Difficulty sitting down on and standing up from a chair with arms (e.g., wheelchair, bedside commode, etc,.)?: A Lot Help needed moving to and from a bed to chair (including a wheelchair)?: A Little Help needed walking in hospital room?: A Little Help needed climbing 3-5 steps with a railing? : A Little 6 Click Score: 19    End of Session Equipment Utilized During Treatment: Gait belt Activity Tolerance: Patient tolerated  treatment well Patient left: in chair;with family/visitor present;with call bell/phone within reach   PT Visit Diagnosis: Other abnormalities of gait and mobility (R26.89);Unsteadiness on feet (R26.81);Dizziness and giddiness (R42);Other symptoms and signs involving the nervous system (R29.898)    Time: 2197-5883 PT Time Calculation (min) (ACUTE ONLY): 26 min   Charges:   PT Evaluation $PT Eval Moderate Complexity: 1 Mod PT Treatments $Gait Training: 8-22 mins   PT G CodesMagda Kiel, [place] [phone] 12/17/2017   Reginia Naas 12/17/2017, 4:10 PM

## 2017-12-18 LAB — GLUCOSE, CAPILLARY
Glucose-Capillary: 124 mg/dL — ABNORMAL HIGH (ref 65–99)
Glucose-Capillary: 154 mg/dL — ABNORMAL HIGH (ref 65–99)

## 2017-12-18 MED ORDER — ATORVASTATIN CALCIUM 80 MG PO TABS: 80.0000 mg | ORAL_TABLET | Freq: Every day | ORAL | 0 refills | Status: DC

## 2017-12-18 MED ORDER — ASPIRIN 325 MG PO TBEC: 325.0000 mg | DELAYED_RELEASE_TABLET | Freq: Every day | ORAL | 0 refills | Status: DC

## 2017-12-18 NOTE — Progress Notes (Addendum)
NEUROHOSPITALISTS STROKE TEAM - DAILY PROGRESS NOTE   ADMISSION HISTORY: Jonathan Miles is an 57 y.o. male presented to the hospital with dizziness slurred speech and difficulty with his gait.  Patient states he takes a baby aspirin a day however he was at [facility] secondary to his daughter having birth and did not take 3 doses of his aspirin.  On the 19th he noted that he was having difficulty with dizziness and gait instability.  For that reason patient came to the emergency department.  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  Patient does have a history of hyperlipidemia, hypertension, diabetes.  Patient does not smoke.  Date last known well: Date: 12/15/2017 Time last known well: Time: 16:00 tPA Given: No: Out of the window Modified Rankin: Rankin Score=0  SUBJECTIVE (INTERVAL HISTORY) No family is at the bedside. Patient is found laying in bed in NAD. Overall he feels his condition is gradually improving. Voices no new complaints. No new events reported overnight. Patient states he is less "dizzy" and has been able to ambulate to bathroom with walker this AM. Continues to have some mile Right sided weakness. States he lost over 45 pounds in the last year with diet and exercise.   OBJECTIVE Lab Results: CBC:  Recent Labs  Lab 12/16/17 1109  WBC 8.6  HGB 13.3  HCT 39.0  MCV 85.7  PLT 264   BMP: Recent Labs  Lab 12/16/17 1109  NA 142  K 3.7  CL 105  CO2 27  GLUCOSE 114*  BUN 10  CREATININE 0.72  CALCIUM 9.3   Liver Function Tests:  Recent Labs  Lab 12/16/17 1109  AST 14*  ALT 14*  ALKPHOS 71  BILITOT 0.6  PROT 7.0  ALBUMIN 4.0   Coagulation Studies:  Recent Labs    12/16/17 1109  APTT 31  INR 1.01   Urinalysis:  Recent Labs  Lab 12/16/17 [place] 1.010  PHURINE 7.0  GLUCOSEU NEGATIVE  HGBUR NEGATIVE  BILIRUBINUR NEGATIVE  KETONESUR  NEGATIVE  PROTEINUR NEGATIVE  NITRITE NEGATIVE  LEUKOCYTESUR NEGATIVE   Urine Drug Screen:     Component Value Date/Time   LABOPIA NONE DETECTED 12/16/2017 1109   COCAINSCRNUR NONE DETECTED 12/16/2017 1109   LABBENZ NONE DETECTED 12/16/2017 1109   AMPHETMU NONE DETECTED 12/16/2017 1109   THCU NONE DETECTED 12/16/2017 1109   LABBARB NONE DETECTED 12/16/2017 1109    Alcohol Level:  Recent Labs  Lab 12/16/17 1109  ETH <10   PHYSICAL EXAM Temp:  [98.2 F (36.8 C)-98.8 F (37.1 C)] 98.8 F (37.1 C) (02/22 0424) Pulse Rate:  [52-66] 54 (02/22 0424) Resp:  [18] 18 (02/22 0424) BP: (134-168)/(74-94) 163/74 (02/22 0424) SpO2:  [97 %-100 %] 97 % (02/22 0424) General - Well nourished, well developed, in no apparent distress HEENT-  Normocephalic,   Cardiovascular - Regular rate and rhythm  Respiratory - Lungs clear bilaterally. No wheezing. Abdomen - soft and non-tender, BS normal Mental Status: Alert, oriented, thought content appropriate.  Speech is mildly dysarthric without evidence of aphasia.  Able to follow 3 step commands without difficulty. Cranial Nerves: II:  Visual fields grossly normal,  III,IV, VI: ptosis not present, extra-ocular motions intact bilaterally, pupils equal, round, reactive to light and accommodation V,VII: smile symmetric, facial light touch sensation normal bilaterally VIII: hearing normal bilaterally IX,X: uvula rises symmetrically XI: bilateral shoulder shrug XII: midline tongue extension Motor: Right : Upper extremity   4+/5  Left:     Upper extremity   5/5  Lower extremity   4++/5     Lower extremity   5/5 Tone and bulk:normal tone throughout; no atrophy noted Sensory: Pinprick and light touch intact throughout, bilaterally Deep Tendon Reflexes: 2+ and symmetric throughout Plantars: Right: downgoing   Left: downgoing Cerebellar: normal finger-to-nose,  and normal heel-to-shin test Gait: Not tested awaiting PT evaluation   IMAGING: I  have personally reviewed the radiological images below and agree with the radiology interpretations.  Ct Head Wo Contrast Result Date: 12/16/2017  IMPRESSION: 1. No evidence of acute intracranial abnormality. 2. Mild chronic small vessel ischemic disease. Electronically Signed   By: Logan Bores M.D.   On: 12/16/2017 11:47   [name] Result Date: 12/16/2017 IMPRESSION: 1 cm acute infarct left pons Mild to moderate chronic microvascular ischemic changes in the cerebral white matter bilaterally. Electronically Signed   By: Franchot Gallo M.D.   On: 12/16/2017 14:33   Echocardiogram:                                          Study Conclusions - Left ventricle: The cavity size was normal. Wall thickness was   increased in a pattern of moderate LVH. Systolic function was   normal. The estimated ejection fraction was in the range of 55%   to 60%. Wall motion was normal; there were no regional wall   motion abnormalities. Doppler parameters are consistent with   abnormal left ventricular relaxation (grade 1 diastolic   dysfunction). - Aortic valve: There was mild regurgitation. Valve area (VTI):   3.48 cm^2. Valve area (Vmax): 3.49 cm^2. Valve area (Vmean): 3.31   cm^2. - Aortic root: The aortic root was mildly dilated. - Left atrium: The atrium was moderately to severely dilated.  B/L Carotid U/S:                                           Final Interpretation: Right Carotid: Velocities in the right ICA are consistent with a 1-39% stenosis. Left Carotid: Velocities in the left ICA are consistent with a 1-39% stenosis. Vertebrals: Both vertebral arteries were patent with antegrade flow. Subclavians: Normal flow hemodynamics were seen in bilateral subclavian arteries.     IMPRESSION: [name] is a 58 y.o. male with PMH of DM, HTN, HLD, CAD and CHF who presents with acute onset dizziness, Slurred speech and Ataxia. MRI reveals:  1 cm acute infarct left pons  Suspected  Etiology: small vessel disease Resultant Symptoms: Dizziness, slurred speech, Ataxia Stroke Risk Factors: diabetes mellitus, hyperlipidemia and hypertension Other Stroke Risk Factors: Advanced age,  ETOH use, Obesity, Body mass index is 39.86 kg/m. , CAD, CHF  Outstanding Stroke Work-up Studies:    Work up is completed  PLAN  12/18/2017: Continue Aspirin/ Statin Frequent neuro checks Telemetry monitoring PT/OT/SLP Consult PM & Rehab Consult Case Management /MSW Ongoing aggressive stroke risk factor management Patient counseled to be compliant with his antithrombotic medications Patient counseled on Lifestyle modifications including, Diet, Exercise, and Stress Follow up with [facility] in 6 weeks  HYPERTENSION: Stable Permissive hypertension (OK if <220/120) for 24-48 hours post stroke and then gradually normalized within 5-7 days. Long term BP goal normotensive. May slowly restart home  B/P medications after 48 hours Home Meds: Multiple B/P Meds  HYPERLIPIDEMIA:    Component Value Date/Time   CHOL 164 12/17/2017 0639   CHOL 140 11/20/2017 1027   TRIG 91 12/17/2017 0639   HDL 41 12/17/2017 0639   HDL 46 11/20/2017 1027   CHOLHDL 4.0 12/17/2017 0639   VLDL 18 12/17/2017 0639   LDLCALC 105 (H) 12/17/2017 0639   LDLCALC 82 11/20/2017 1027  Home Meds:  Lipitor 40 mg LDL  goal < 70 Increased to Lipitor to 80 mg daily Continue statin at discharge  DIABETES: Lab Results  Component Value Date   HGBA1C 5.5 12/17/2017  HgbA1c goal < 7.0  OBESITY Obesity, Body mass index is 39.86 kg/m. Greater than/equal to 30  Other Active Problems: Principal Problem:   TIA (transient ischemic attack) Active Problems:   Hyperlipidemia   Hypertension   CAD (coronary artery disease)   Chronic diastolic CHF (congestive heart failure) (HCC)   Type 2 diabetes mellitus without complication, without long-term current use of insulin ([place])   Difficulty speaking    Dizziness    Hospital day # 1 VTE prophylaxis: SCD's  Diet : Fall precautions Diet regular Room service appropriate? Yes; Fluid consistency: Thin   FAMILY UPDATES: No family at bedside  TEAM UPDATES: Kayleen Memos, DO   Prior Home Stroke Medications:  aspirin 81 mg daily  Discharge Stroke Meds:  Please discharge patient on aspirin 325 mg daily   Disposition: 01-Home or Self Care Therapy Recs:               PENDING Follow Up:  Follow-up Information    Garvin Fila, MD. Schedule an appointment as soon as possible for a visit in 6 week(s).   Specialties:  Neurology, Radiology Contact information: [address] [place] [address] [phone]          Harrison Mons, PA-C -PCP Follow up in 1-2 weeks      Assessment & plan discussed with with attending physician and they are in agreement.    Mary Sella, ANP-C Stroke Neurology Team 12/18/2017 7:33 AM   12/18/2017 ATTENDING ASSESSMENT:    He presented with dizziness and gait imbalance and slurred speech due to small left pontine infarct from small vessel disease. Recommend continuing ongoing stroke workup and physical therapy and rehabilitation consults. Aspirin for stroke prevention. Greater than 50% time during this 25 minute visit was spent on counseling and coordination of care about his lacunar stroke and answered questions Neurology to sign-off at this time. Please call with any further questions or concerns. Thank you for this consultation. Antony Contras, MD Medical Director [facility] Pager: [phone] 12/18/2017 2:26 PM To contact Stroke Continuity provider, please refer to [email or web address] After hours, contact General Neurology

## 2017-12-18 NOTE — Care Management Note (Signed)
Case Management Note  Patient Details  Name: Laverne Klugh MRN: [id number] Date of Birth: Oct 09, 1961  Subjective/Objective:       Pt admitted with CVA. He is from home with his spouse.             Action/Plan: PT/OT recommending outpatient therapy. CM met with the patient and he would like to attend [facility]. Orders in Epic and information on the AVS.  Pt has intermittent supervision and transportation home.   Expected Discharge Date:  12/18/17               Expected Discharge Plan:  OP Rehab  In-House Referral:     Discharge planning Services  CM Consult  Post Acute Care Choice:    Choice offered to:     DME Arranged:    DME Agency:     HH Arranged:    HH Agency:     Status of Service:  Completed, signed off  If discussed at [employer] of Stay Meetings, dates discussed:    Additional Comments:  Pollie Friar, RN 12/18/2017, 1:42 PM

## 2017-12-18 NOTE — Discharge Instructions (Signed)
Ischemic Stroke °An ischemic stroke is the sudden death of brain tissue. Blood carries oxygen to all areas of the body. This type of stroke happens when your blood does not flow to your brain like normal. Your brain cannot get the oxygen it needs. This is an emergency. It must be treated right away. °Symptoms of a stroke usually happen all of a sudden. You may notice them when you wake up. They can include: °· Weakness or loss of feeling in your face, arm, or leg. This often happens on one side of the body. °· Trouble walking. °· Trouble moving your arms or legs. °· Loss of balance or coordination. °· Feeling confused. °· Trouble talking or understanding what people are saying. °· Slurred speech. °· Trouble seeing. °· Seeing two of one object (double vision). °· Feeling dizzy. °· Feeling sick to your stomach (nauseous) and throwing up (vomiting). °· A very bad headache for no reason. ° °Get help as soon as any of these problems start. This is important. Some treatments work better if they are given right away. These include: °· Aspirin. °· Medicines to control blood pressure. °· A shot (injection) of medicine to break up the blood clot. °· Treatments given in the blood vessel (artery) to take out the clot or break it up. ° °Other treatments may include: °· Oxygen. °· Fluids given through an IV tube. °· Medicines to thin out your blood. °· Procedures to help your blood flow better. ° °What increases the risk? °Certain things may make you more likely to have a stroke. Some of these are things that you can change, such as: °· Being very overweight (obesity). °· Smoking. °· Taking birth control pills. °· Not being active. °· Drinking too much alcohol. °· Using drugs. ° °Other risk factors include: °· High blood pressure. °· High cholesterol. °· Diabetes. °· Heart disease. °· Being African American, Native American, Hispanic, or [place] Native. °· Being over age 60. °· Family history of stroke. °· Having had blood clots,  stroke, or warning stroke (transient ischemic attack, TIA) in the past. °· Sickle cell disease. °· Being a woman with a history of high blood pressure in pregnancy (preeclampsia). °· Migraine headache. °· Sleep apnea. °· Having an irregular heartbeat (atrial fibrillation). °· Long-term (chronic) diseases that cause soreness and swelling (inflammation). °· Disorders that affect how your blood clots. ° °Follow these instructions at home: °Medicines °· Take over-the-counter and prescription medicines only as told by your doctor. °· If you were told to take aspirin or another medicine to thin your blood, take it exactly as told by your doctor. °? Taking too much of the medicine can cause bleeding. °? If you do not take enough, it may not work as well. °· Know the side effects of your medicines. If you are taking a blood thinner, make sure you: °? Hold pressure over any cuts for longer than usual. °? Tell your dentist and other doctors that you take this medicine. °? Avoid activities that may cause damage or injury to your body. °Eating and drinking °· Follow instructions from your doctor about what you cannot eat or drink. °· Eat healthy foods. °· If you have trouble with swallowing, do these things to avoid choking: °? Take small bites when eating. °? Eat foods that are soft or pureed. °Safety °· Follow instructions from your health care team about physical activity. °· Use a walker or cane as told by your doctor. °· Keep your home safe so   you do not fall. This may include: ? Having experts look at your home to make sure it is safe. ? Putting grab bars in the bedroom and bathroom. ? Using raised toilets. ? Putting a seat in the shower. General instructions  Do not use any tobacco products. ? Examples of these are cigarettes, chewing tobacco, and e-cigarettes. ? If you need help quitting, ask your doctor.  Limit how much alcohol you drink. This means no more than 1 drink a day for nonpregnant women and 2  drinks a day for men. One drink equals 12 oz of beer, 5 oz of wine, or 1 oz of hard liquor.  If you need help to stop using drugs or alcohol, ask your doctor to refer you to a program or specialist.  Stay active. Exercise as told by your doctor.  Keep all follow-up visits as told by your doctor. This is important. Get help right away if:  You suddenly: ? Have weakness or loss of feeling in your face, arm, or leg. ? Feel confused. ? Have trouble talking or understanding what people are saying. ? Have trouble seeing. ? Have trouble walking. ? Have trouble moving your arms or legs. ? Feel dizzy. ? Lose your balance or coordination. ? Have a very bad headache and you do not know why.  You pass out (lose consciousness) or almost pass out.  You have jerky movements that you cannot control (seizure). These symptoms may be an emergency. Do not wait to see if the symptoms will go away. Get medical help right away. Call your local emergency services (911 in the [place] Do not drive yourself to the hospital. This information is not intended to replace advice given to you by your health care provider. Make sure you discuss any questions you have with your health care provider. Document Released: 10/02/2011 Document Revised: 03/25/2016 Document Reviewed: 01/09/2016 Elsevier Interactive Patient Education  2018 [employer].   Stroke Prevention Some medical conditions and behaviors are associated with a higher chance of having a stroke. You can help prevent a stroke by making nutrition, lifestyle, and other changes, including managing any medical conditions you may have. What nutrition changes can be made?  Eat healthy foods. You can do this by: ? Choosing foods high in fiber, such as fresh fruits and vegetables and whole grains. ? Eating at least 5 or more servings of fruits and vegetables a day. Try to fill half of your plate at each meal with fruits and vegetables. ? Choosing lean protein  foods, such as lean cuts of meat, poultry without skin, fish, tofu, beans, and nuts. ? Eating low-fat dairy products. ? Avoiding foods that are high in salt (sodium). This can help lower blood pressure. ? Avoiding foods that have saturated fat, trans fat, and cholesterol. This can help prevent high cholesterol. ? Avoiding processed and premade foods.  Follow your health care provider's specific guidelines for losing weight, controlling high blood pressure (hypertension), lowering high cholesterol, and managing diabetes. These may include: ? Reducing your daily calorie intake. ? Limiting your daily sodium intake to 1,500 milligrams (mg). ? Using only healthy fats for cooking, such as olive oil, canola oil, or sunflower oil. ? Counting your daily carbohydrate intake. What lifestyle changes can be made?  Maintain a healthy weight. Talk to your health care provider about your ideal weight.  Get at least 30 minutes of moderate physical activity at least 5 days a week. Moderate activity includes brisk walking, biking, and swimming.  Do not use any products that contain nicotine or tobacco, such as cigarettes and e-cigarettes. If you need help quitting, ask your health care provider. It may also be helpful to avoid exposure to secondhand smoke.  Limit alcohol intake to no more than 1 drink a day for nonpregnant women and 2 drinks a day for men. One drink equals 12 oz of beer, 5 oz of wine, or 1 oz of hard liquor.  Stop any illegal drug use.  Avoid taking birth control pills. Talk to your health care provider about the risks of taking birth control pills if: ? You are over 86 years old. ? You smoke. ? You get migraines. ? You have ever had a blood clot. What other changes can be made?  Manage your cholesterol levels. ? Eating a healthy diet is important for preventing high cholesterol. If cholesterol cannot be managed through diet alone, you may also need to take medicines. ? Take any  prescribed medicines to control your cholesterol as told by your health care provider.  Manage your diabetes. ? Eating a healthy diet and exercising regularly are important parts of managing your blood sugar. If your blood sugar cannot be managed through diet and exercise, you may need to take medicines. ? Take any prescribed medicines to control your diabetes as told by your health care provider.  Control your hypertension. ? To reduce your risk of stroke, try to keep your blood pressure below 130/80. ? Eating a healthy diet and exercising regularly are an important part of controlling your blood pressure. If your blood pressure cannot be managed through diet and exercise, you may need to take medicines. ? Take any prescribed medicines to control hypertension as told by your health care provider. ? Ask your health care provider if you should monitor your blood pressure at home. ? Have your blood pressure checked every year, even if your blood pressure is normal. Blood pressure increases with age and some medical conditions.  Get evaluated for sleep disorders (sleep apnea). Talk to your health care provider about getting a sleep evaluation if you snore a lot or have excessive sleepiness.  Take over-the-counter and prescription medicines only as told by your health care provider. Aspirin or blood thinners (antiplatelets or anticoagulants) may be recommended to reduce your risk of forming blood clots that can lead to stroke.  Make sure that any other medical conditions you have, such as atrial fibrillation or atherosclerosis, are managed. What are the warning signs of a stroke? The warning signs of a stroke can be easily remembered as BEFAST.  B is for balance. Signs include: ? Dizziness. ? Loss of balance or coordination. ? Sudden trouble walking.  E is for eyes. Signs include: ? A sudden change in vision. ? Trouble seeing.  F is for face. Signs include: ? Sudden weakness or numbness of  the face. ? The face or eyelid drooping to one side.  A is for arms. Signs include: ? Sudden weakness or numbness of the arm, usually on one side of the body.  S is for speech. Signs include: ? Trouble speaking (aphasia). ? Trouble understanding.  T is for time. ? These symptoms may represent a serious problem that is an emergency. Do not wait to see if the symptoms will go away. Get medical help right away. Call your local emergency services (911 in the [place] Do not drive yourself to the hospital.  Other signs of stroke may include: ? A sudden, severe headache with no  known cause. ? Nausea or vomiting. ? Seizure.  Where to find more information: For more information, visit:  American Stroke Association: www.strokeassociation.org  National Stroke Association: www.stroke.org  Summary  You can prevent a stroke by eating healthy, exercising, not smoking, limiting alcohol intake, and managing any medical conditions you may have.  Do not use any products that contain nicotine or tobacco, such as cigarettes and e-cigarettes. If you need help quitting, ask your health care provider. It may also be helpful to avoid exposure to secondhand smoke.  Remember BEFAST for warning signs of stroke. Get help right away if you or a loved one has any of these signs. This information is not intended to replace advice given to you by your health care provider. Make sure you discuss any questions you have with your health care provider. Document Released: 11/20/2004 Document Revised: 11/18/2016 Document Reviewed: 11/18/2016 Elsevier Interactive Patient Education  2018 [employer].   Stroke Prevention Some health problems and behaviors may make it more likely for you to have a stroke. Below are ways to lessen your risk of having a stroke.  Be active for at least 30 minutes on most or all days.  Do not smoke. Try not to be around others who smoke.  Do not drink too much alcohol. ? Do not have  more than 2 drinks a day if you are a man. ? Do not have more than 1 drink a day if you are a woman and are not pregnant.  Eat healthy foods, such as fruits and vegetables. If you were put on a specific diet, follow the diet as told.  Keep your cholesterol levels under control through diet and medicines. Look for foods that are low in saturated fat, trans fat, cholesterol, and are high in fiber.  If you have diabetes, follow all diet plans and take your medicine as told.  Ask your doctor if you need treatment to lower your blood pressure. If you have high blood pressure (hypertension), follow all diet plans and take your medicine as told by your doctor.  If you are 40-43 years old, have your blood pressure checked every 3-5 years. If you are age 78 or older, have your blood pressure checked every year.  Keep a healthy weight. Eat foods that are low in calories, salt, saturated fat, trans fat, and cholesterol.  Do not take drugs.  Avoid birth control pills, if this applies. Talk to your doctor about the risks of taking birth control pills.  Talk to your doctor if you have sleep problems (sleep apnea).  Take all medicine as told by your doctor. ? You may be told to take aspirin or blood thinner medicine. Take this medicine as told by your doctor. ? Understand your medicine instructions.  Make sure any other conditions you have are being taken care of.  Get help right away if:  You suddenly lose feeling (you feel numb) or have weakness in your face, arm, or leg.  Your face or eyelid hangs down to one side.  You suddenly feel confused.  You have trouble talking (aphasia) or understanding what people are saying.  You suddenly have trouble seeing in one or both eyes.  You suddenly have trouble walking.  You are dizzy.  You lose your balance or your movements are clumsy (uncoordinated).  You suddenly have a very bad headache and you do not know the cause.  You have new chest  pain.  Your heart feels like it is fluttering or  skipping a beat (irregular heartbeat). Do not wait to see if the symptoms above go away. Get help right away. Call your local emergency services (911 in [place] Do not drive yourself to the hospital. This information is not intended to replace advice given to you by your health care provider. Make sure you discuss any questions you have with your health care provider. Document Released: 04/13/2012 Document Revised: 03/20/2016 Document Reviewed: 04/15/2013 Elsevier Interactive Patient Education  2018 [employer].   Vertigo Vertigo means that you feel like you are moving when you are not. Vertigo can also make you feel like things around you are moving when they are not. This feeling can come and go at any time. Vertigo often goes away on its own. Follow these instructions at home:  Avoid making fast movements.  Avoid driving.  Avoid using heavy machinery.  Avoid doing any task or activity that might cause danger to you or other people if you would have a vertigo attack while you are doing it.  Sit down right away if you feel dizzy or have trouble with your balance.  Take over-the-counter and prescription medicines only as told by your doctor.  Follow instructions from your doctor about which positions or movements you should avoid.  Drink enough fluid to keep your pee (urine) clear or pale yellow.  Keep all follow-up visits as told by your doctor. This is important. Contact a doctor if:  Medicine does not help your vertigo.  You have a fever.  Your problems get worse or you have new symptoms.  Your family or friends see changes in your behavior.  You feel sick to your stomach (nauseous) or you throw up (vomit).  You have a pins and needles feeling or you are numb in part of your body. Get help right away if:  You have trouble moving or talking.  You are always dizzy.  You pass out (faint).  You get very bad  headaches.  You feel weak or have trouble using your hands, arms, or legs.  You have changes in your hearing.  You have changes in your seeing (vision).  You get a stiff neck.  Bright light starts to bother you. This information is not intended to replace advice given to you by your health care provider. Make sure you discuss any questions you have with your health care provider. Document Released: 07/22/2008 Document Revised: 03/20/2016 Document Reviewed: 02/05/2015 Elsevier Interactive Patient Education  [employer].

## 2017-12-18 NOTE — Progress Notes (Signed)
Physical Therapy Treatment Patient Details Name: Jonathan Miles MRN: [id number] DOB: November 06, 1960 Today's Date: 12/18/2017    History of Present Illness 57 y.o. male with medical history significant of coronary artery disease, congestive heart failure, COPD, diabetes comes in with over 1 day of dizziness and slurred speech. MRI + 1 cm acute infarct left paramedian pons.     PT Comments    Pt admitted with above diagnosis. Pt currently with functional limitations due to the deficits listed below (see PT Problem List). Pt was able to ambulate with RW in controlled environment without assist. Safe using RW.  Reviewed habituation exercises and added x1 exercises.  Pt doing well.  Pt will benefit from skilled PT to increase their independence and safety with mobility to allow discharge to the venue listed below.     Follow Up Recommendations  Outpatient PT(neurorehabilitation)     Equipment Recommendations  None recommended by PT    Recommendations for Other Services       Precautions / Restrictions Precautions Precautions: Fall    Mobility  Bed Mobility Overal bed mobility: Modified Independent             General bed mobility comments: sitting EOB on arrival  Transfers Overall transfer level: Needs assistance   Transfers: Sit to/from Stand Sit to Stand: Min guard;Supervision Stand pivot transfers: Min guard          Ambulation/Gait Ambulation/Gait assistance: Min guard;Supervision Ambulation Distance (Feet): 450 Feet Assistive device: Rolling walker (2 wheeled) Gait Pattern/deviations: Step-through pattern;Decreased stride length;Trunk flexed   Gait velocity interpretation: <1.8 ft/sec, indicative of risk for recurrent falls General Gait Details: challenged pt by having pt perform some head turns with activity.  Pt is safe with RW in controlled environment.  Pt without LOB with RW.     Stairs            Wheelchair Mobility    Modified Rankin (Stroke  Patients Only) Modified Rankin (Stroke Patients Only) Pre-Morbid Rankin Score: No symptoms Modified Rankin: Moderately severe disability     Balance Overall balance assessment: Needs assistance Sitting-balance support: No upper extremity supported;Feet supported Sitting balance-Leahy Scale: Good     Standing balance support: No upper extremity supported;During functional activity Standing balance-Leahy Scale: Fair Standing balance comment: can stand without external support but feels safer with RW at this time.              High level balance activites: Direction changes;Turns;Sudden stops;Head turns High Level Balance Comments: Min guard assist with RW            Cognition Arousal/Alertness: Awake/alert Behavior During Therapy: WFL for tasks assessed/performed Overall Cognitive Status: Within Functional Limits for tasks assessed                                        Exercises Other Exercises Other Exercises: habituation exercises, sit<> stand x 5 and head turns x 5 Other Exercises: x1 exercises initiated in standing    General Comments        Pertinent Vitals/Pain Pain Assessment: No/denies pain    Home Living                      Prior Function            PT Goals (current goals can now be found in the care plan section) Progress towards PT goals: Progressing toward  goals    Frequency    Min 4X/week      PT Plan Current plan remains appropriate    Co-evaluation              AM-PAC PT "6 Clicks" Daily Activity  Outcome Measure  Difficulty turning over in bed (including adjusting bedclothes, sheets and blankets)?: None Difficulty moving from lying on back to sitting on the side of the bed? : None Difficulty sitting down on and standing up from a chair with arms (e.g., wheelchair, bedside commode, etc,.)?: None Help needed moving to and from a bed to chair (including a wheelchair)?: None Help needed walking in  hospital room?: A Little Help needed climbing 3-5 steps with a railing? : A Little 6 Click Score: 22    End of Session Equipment Utilized During Treatment: Gait belt Activity Tolerance: Patient tolerated treatment well Patient left: with call bell/phone within reach;in bed Nurse Communication: Mobility status PT Visit Diagnosis: Other abnormalities of gait and mobility (R26.89);Unsteadiness on feet (R26.81);Dizziness and giddiness (R42);Other symptoms and signs involving the nervous system (R29.898)     Time: 1308-6578 PT Time Calculation (min) (ACUTE ONLY): 28 min  Charges:  $Gait Training: 8-22 mins $Therapeutic Exercise: 8-22 mins                    G Codes:       Kamaree Berkel,PT Acute Rehabilitation [phone]    Denice Paradise 12/18/2017, 12:30 PM

## 2017-12-18 NOTE — Progress Notes (Incomplete)
Pt being discharged from hospital per orders from MD. Pt educated on discharge instructions. Pt verbalized understanding of instructions. All questions and concerns were addressed. Pt's IV was removed prior to discharge. Pt exited hospital via wheelchair accompanied by staff. 

## 2017-12-18 NOTE — Progress Notes (Signed)
12/18/17 1300  PT Visit Information  Last PT Received On 12/18/17  Assistance Needed +1  History of Present Illness 57 y.o. male with medical history significant of coronary artery disease, congestive heart failure, COPD, diabetes comes in with over 1 day of dizziness and slurred speech. MRI + 1 cm acute infarct left paramedian pons.   Subjective Data  Patient Stated Goal to return to mormal  Precautions  Precautions Fall  Restrictions  Weight Bearing Restrictions No  Pain Assessment  Pain Assessment No/denies pain  Cognition  Arousal/Alertness Awake/alert  Behavior During Therapy [facility] for tasks assessed/performed  Overall Cognitive Status Within Functional Limits for tasks assessed  Bed Mobility  Overal bed mobility Modified Independent  Transfers  Overall transfer level Needs assistance  Transfers Sit to/from Stand  Sit to Stand Supervision  Ambulation/Gait  Ambulation/Gait assistance Supervision  Ambulation Distance (Feet) 600 Feet  Assistive device Rolling walker (2 wheeled);Straight cane  Gait Pattern/deviations Step-through pattern;Decreased stride length  General Gait Details challenged pt by having pt perform some head turns with activity.  Pt is safe with RW in controlled environment.  Pt without LOB with RW.  Pt also walked without RW and was safe without RW in controlled envirofnment.  Gait velocity interpretation <1.8 ft/sec, indicative of risk for recurrent falls  Stairs Yes  Stairs assistance Supervision  Stair Management Forwards;One rail Right;Step to pattern;With walker  Number of Stairs 12  General stair comments No issues going up and down carrying Walker folded. Decided that pt can walk up and down with cane and use cane upstairs and RW downstairs.   Modified Rankin (Stroke Patients Only)  Pre-Morbid Rankin Score 0  Modified Rankin 4  Balance  Overall balance assessment Needs assistance  Sitting-balance support No upper extremity supported;Feet supported   Sitting balance-Leahy Scale Good  Standing balance support No upper extremity supported;During functional activity  Standing balance-Leahy Scale Fair  Standing balance comment can stand without external support but feels safer with RW at this time.   High level balance activites Backward walking;Direction changes;Turns;Sudden stops;Head turns  High Level Balance Comments Min guard assist with RW  Exercises  Exercises Other exercises  PT - End of Session  Equipment Utilized During Treatment Gait belt  Activity Tolerance Patient tolerated treatment well  Patient left with call bell/phone within reach;in bed  Nurse Communication Mobility status  PT - Assessment/Plan  PT Plan Current plan remains appropriate  PT Visit Diagnosis Other abnormalities of gait and mobility (R26.89);Unsteadiness on feet (R26.81);Dizziness and giddiness (R42);Other symptoms and signs involving the nervous system (R29.898)  PT Frequency (ACUTE ONLY) Min 4X/week  Follow Up Recommendations Outpatient PT (neurorehabilitation)  PT equipment None recommended by PT  AM-PAC PT "6 Clicks" Daily Activity Outcome Measure  Difficulty turning over in bed (including adjusting bedclothes, sheets and blankets)? 4  Difficulty moving from lying on back to sitting on the side of the bed?  4  Difficulty sitting down on and standing up from a chair with arms (e.g., wheelchair, bedside commode, etc,.)? 4  Help needed moving to and from a bed to chair (including a wheelchair)? 4  Help needed walking in hospital room? 3  Help needed climbing 3-5 steps with a railing?  4  6 Click Score 23  Mobility G Code  CI  PT Goal Progression  Progress towards PT goals Progressing toward goals  PT Time Calculation  PT Start Time (ACUTE ONLY) 1310  PT Stop Time (ACUTE ONLY) 1330  PT Time Calculation (min) (  ACUTE ONLY) 20 min  PT General Charges  $$ ACUTE PT VISIT 1 Visit  PT Treatments  $Gait Training 8-22 mins  Pt was able to ambulate to  stairs and perform up and down stairs carrying RW up 10 steps and use rail on other side with supervision.  Pt feels comfortable with stairs and discussed that pt can use RW on ground floor and use cane going up and down stairs and while upstairs.  Then he can use cane on upper level of home as he can walk with cane safely as well.  Pt agrees with this plan and had no LOB during treatment with challenges given.  Outpt PT planned and pt agrees.   Thanks [employer] [facility] [phone] (pager)

## 2017-12-18 NOTE — Discharge Summary (Signed)
Discharge Summary  Jonathan Miles WNU:[id number] DOB: Mar 27, 1961  PCP: Harrison Mons, PA-C  Admit date: 12/16/2017 Discharge date: 12/18/2017  Time spent: 25 minutes   Recommendations for Outpatient Follow-up:  1. Follow up with neurology 2. Follow up with PCP 3. Fall precaution 4. Continue PT in the outpatient setting  Discharge Diagnoses:  Active Hospital Problems   Diagnosis Date Noted  . TIA (transient ischemic attack) 12/16/2017  . Dizziness   . Difficulty speaking   . Type 2 diabetes mellitus without complication, without long-term current use of insulin ([place]) 10/31/2016  . Chronic diastolic CHF (congestive heart failure) ([place]) 11/23/2012  . CAD (coronary artery disease) 11/27/2011  . Hypertension 01/23/2011  . Hyperlipidemia 01/23/2011    Resolved Hospital Problems  No resolved problems to display.    Discharge Condition: stable  Diet recommendation: resume previous diet   Vitals:   12/18/17 0818 12/18/17 1130  BP: 133/86 (!) 148/88  Pulse: 66 62  Resp: 18 18  Temp: 98.6 F (37 C) 98.4 F (36.9 C)  SpO2: 96% 98%    History of present illness:  Jonathan Miles a 57 y.o.malewith medical history significant ofcoronary artery disease, congestive heart failure, COPD, diabetes comes in with over 1 day of dizziness and slurred speech that started yesterday at 4 PM. He denies any vision changes. He denies any numbness tingling or weakness anywhere. He denies any tongue numbness or heaviness. The dizziness is worse when he gets up and moves around. He denies any headache. He denies any fevers. He denies any nausea vomiting or diarrhea. He denies any history of vertigo. Patient is referred for admission for possible TIA versus CVA. Neurology at home was called who recommended transferring to come for neurological evaluation. MRI brain done on 12/16/17 revealed 1 cm acute infarct left paramedian pons.   12/17/17: 2D Echo Grade 1 diastolic dysfunction.    On the day of discharge the patient was hemodynamically stable. He will need to follow up with neurology, his PCP and continue with outpatient physical therapy.  Hospital Course:  Principal Problem:   TIA (transient ischemic attack) Active Problems:   Hyperlipidemia   Hypertension   CAD (coronary artery disease)   Chronic diastolic CHF (congestive heart failure) ([facility])   Type 2 diabetes mellitus without complication, without long-term current use of insulin ([facility])   Difficulty speaking   Dizziness  Acute infarct left pons -2d echo 0/34/74 grade 1 diastolic dysfunction -ldl 105 -goal ldl <70 -A1c 5.5 -continue high dose statin -PT/OT -OT recommends outpatient OT -neurology following -continue asa, statin -neuro checks q4h -fall precaution  HLD -ldl 105 -continue statin -goal ldl less than 70  Grade 1 diastolic dysfunction -continue BB, asa statin, lasix -LVEF 55-60% (12/17/17)  Depression -continue zoloft  HTN -BP is stable -lisinopril, coreg  Type 2 diabetes -well controlled -on metformin at home  -ISS sensitive  -avoid hypoglycemia -A1C 5.5   Procedures:  none  Consultations:  Neurology  Stroke team  Discharge Exam: BP (!) 148/88 (BP Location: Right Arm)   Pulse 62   Temp 98.4 F (36.9 C) (Oral)   Resp 18   Ht 6' (1.829 m)   Wt 133.3 kg (293 lb 14 oz)   SpO2 98%   BMI 39.86 kg/m   General: 57 yo AAM WD WN NAD A&O x 3  Cardiovascular: RRR no rubs or gallops  Respiratory: CTA no wheezes or rales  Discharge Instructions You were cared for by a hospitalist during your hospital stay. If you  have any questions about your discharge medications or the care you received while you were in the hospital after you are discharged, you can call the unit and asked to speak with the hospitalist on call if the hospitalist that took care of you is not available. Once you are discharged, your primary care physician will handle any further medical  issues. Please note that NO REFILLS for any discharge medications will be authorized once you are discharged, as it is imperative that you return to your primary care physician (or establish a relationship with a primary care physician if you do not have one) for your aftercare needs so that they can reassess your need for medications and monitor your lab values.  Discharge Instructions    Ambulatory referral to Neurology   Complete by:  As directed    An appointment is requested in approximately: 6 weeks Follow up with stroke clinic (Dr Leonie Man preferred, if not available, then consider [facility] Chestnut, [facility] or Jaynee Eagles whoever is available) at [facility] in about 6-8 weeks. Thanks.     Allergies as of 12/18/2017      Reactions   Ibuprofen Other (See Comments)   TOLD NOT TO TAKE-PER CARDS   Iodinated Diagnostic Agents Hives   Hives started 4 days after cta chest was performed, minimal relief w/ benadryl x 3 days, uncertain if reaction was actually iv contrast or other allergen,allergy tests to follow.wife will make [place] aware of results//a.calhoun      Medication List    STOP taking these medications   diclofenac sodium 1 % Gel Commonly known as:  VOLTAREN   traMADol 50 MG tablet Commonly known as:  ULTRAM     TAKE these medications   acetaminophen 500 MG tablet Commonly known as:  TYLENOL TAKE 1-2 TABS EVERY 6 HOURS AS NEEDED FOR PAIN   aspirin 325 MG EC tablet Take 1 tablet (325 mg total) by mouth at bedtime. What changed:    medication strength  how much to take   atorvastatin 80 MG tablet Commonly known as:  LIPITOR Take 1 tablet (80 mg total) by mouth daily at 6 PM. What changed:    medication strength  how much to take  when to take this   carvedilol 12.5 MG tablet Commonly known as:  COREG Take 1 tablet (12.5 mg total) by mouth 2 (two) times daily.   felodipine 10 MG 24 hr tablet Commonly known as:  PLENDIL TAKE ONE TABLET BY MOUTH DAILY   furosemide 40 MG  tablet Commonly known as:  LASIX TAKE ONE TABLET BY MOUTH DAILY   Lidocaine 0.5 % Gel Apply 1 application topically daily as needed (LEG PAIN).   lisinopril 20 MG tablet Commonly known as:  PRINIVIL,ZESTRIL TAKE ONE TABLET BY MOUTH DAILY   loratadine 10 MG tablet Commonly known as:  CLARITIN Take 10 mg by mouth at bedtime.   metFORMIN 1000 MG tablet Commonly known as:  GLUCOPHAGE Take 1,000 mg by mouth 2 (two) times daily with a meal.   MYRBETRIQ 50 MG Tb24 tablet Generic drug:  mirabegron ER Take 50 mg by mouth at bedtime.   promethazine 25 MG tablet Commonly known as:  PHENERGAN Take 1 tablet (25 mg total) by mouth every 6 (six) hours as needed for nausea or vomiting.   sertraline 100 MG tablet Commonly known as:  ZOLOFT Take 1 tablet (100 mg total) by mouth daily.   tamsulosin 0.4 MG Caps capsule Commonly known as:  FLOMAX Take 2 capsules (0.8  mg total) by mouth daily after supper.      Allergies  Allergen Reactions  . Ibuprofen Other (See Comments)    TOLD NOT TO TAKE-PER CARDS  . Iodinated Diagnostic Agents Hives    Hives started 4 days after cta chest was performed, minimal relief w/ benadryl x 3 days, uncertain if reaction was actually iv contrast or other allergen,allergy tests to follow.wife will make [place] aware of results//a.calhoun   Follow-up Information    Garvin Fila, MD. Schedule an appointment as soon as possible for a visit in 6 week(s).   Specialties:  Neurology, Radiology Contact information: [address] [place] [address] [place] Follow up in 1 week(s).            The results of significant diagnostics from this hospitalization (including imaging, microbiology, ancillary and laboratory) are listed below for reference.    Significant Diagnostic Studies: Dg Chest 2 View  Result Date: 12/16/2017 CLINICAL DATA:  Bilateral lower extremity weakness and dizziness  since 12/15/2017. EXAM: CHEST  2 VIEW COMPARISON:  CT chest 10/15/2015.  PA and lateral chest 10/07/2012. FINDINGS: The lungs are clear. Heart size is normal. No pneumothorax or pleural effusion. The patient is status post CABG. No acute bony abnormality. IMPRESSION: No acute disease. Electronically Signed   By: Inge Rise M.D.   On: 12/16/2017 11:39   Ct Head Wo Contrast  Result Date: 12/16/2017 CLINICAL DATA:  Ataxia.  Slurred speech.  Dizziness. EXAM: CT HEAD WITHOUT CONTRAST TECHNIQUE: Contiguous axial images were obtained from the base of the skull through the vertex without intravenous contrast. COMPARISON:  None. FINDINGS: Brain: There is no evidence of acute infarct, intracranial hemorrhage, mass, midline shift, or extra-axial fluid collection. The ventricles and sulci are normal. Subcortical greater than periventricular white matter hypodensities are nonspecific but compatible with mild chronic small vessel ischemic disease. Vascular: Calcified atherosclerosis at the skull base. No hyperdense vessel. Skull: No fracture or focal osseous lesion. Sinuses/Orbits: Visualized paranasal sinuses and mastoid air cells are clear. Orbits are unremarkable. Other: None. IMPRESSION: 1. No evidence of acute intracranial abnormality. 2. Mild chronic small vessel ischemic disease. Electronically Signed   By: Logan Bores M.D.   On: 12/16/2017 11:47   [name]  Result Date: 12/16/2017 CLINICAL DATA:  Neuro deficit. Headache with difficulty walking. Weakness and slurred speech. EXAM: MRI HEAD WITHOUT CONTRAST TECHNIQUE: Multiplanar, multiecho pulse sequences of the brain and surrounding structures were obtained without intravenous contrast. COMPARISON:  CT head 12/16/2017 FINDINGS: Brain: 1 cm acute infarct left paramedian pons. No other acute infarct. Scattered white matter hyperintensities bilaterally compatible with chronic microvascular ischemia. Negative for hemorrhage or mass. Ventricle size  normal. Vascular: Normal arterial flow voids Skull and upper cervical spine: Negative Sinuses/Orbits: Negative Other: None IMPRESSION: 1 cm acute infarct left pons Mild to moderate chronic microvascular ischemic changes in the cerebral white matter bilaterally. Electronically Signed   By: Franchot Gallo M.D.   On: 12/16/2017 14:33    Microbiology: No results found for this or any previous visit (from the past 240 hour(s)).   Labs: Basic Metabolic Panel: Recent Labs  Lab 12/16/17 1109  NA 142  K 3.7  CL 105  CO2 27  GLUCOSE 114*  BUN 10  CREATININE 0.72  CALCIUM 9.3   Liver Function Tests: Recent Labs  Lab 12/16/17 1109  AST 14*  ALT 14*  ALKPHOS 71  BILITOT  0.6  PROT 7.0  ALBUMIN 4.0   No results for input(s): LIPASE, AMYLASE in the last 168 hours. No results for input(s): AMMONIA in the last 168 hours. CBC: Recent Labs  Lab 12/16/17 1109  WBC 8.6  NEUTROABS 6.3  HGB 13.3  HCT 39.0  MCV 85.7  PLT 264   Cardiac Enzymes: No results for input(s): CKTOTAL, CKMB, CKMBINDEX, TROPONINI in the last 168 hours. BNP: BNP (last 3 results) No results for input(s): BNP in the last 8760 hours.  ProBNP (last 3 results) No results for input(s): PROBNP in the last 8760 hours.  CBG: Recent Labs  Lab 12/17/17 0611 12/17/17 1131 12/17/17 1714 12/17/17 2102 12/18/17 0612  GLUCAP 109* 108* 121* 150* 124*       Signed:  Kayleen Memos, MD [facility] 12/18/2017, 1:23 PM

## 2017-12-21 ENCOUNTER — Telehealth: Payer: Self-pay

## 2017-12-21 NOTE — Telephone Encounter (Addendum)
Transition Care Management Follow-up Telephone Call   Date discharged: 12/18/17   How have you been since you were released from the hospital? Patient states that he still has some weakness on his right side.    Do you understand why you were in the hospital? yes   Do you understand the discharge instructions? yes   Where were you discharged to? home   Items Reviewed:  Medications reviewed: yes  Allergies reviewed: yes  Dietary changes reviewed: yes  Referrals reviewed: yes   Functional Questionnaire:   Activities of Daily Living (ADLs):   He states they are independent in the following: ambulation, bathing and hygiene, feeding, continence, grooming, toileting and dressing States they require assistance with the following: none   Any transportation issues/concerns?: no   Any patient concerns? no   Confirmed importance and date/time of follow-up visits scheduled yes  Provider Appointment booked with Harrison Mons on 12/26/17 @ 2:40 pm   Confirmed with patient if condition begins to worsen call PCP or go to the ER.  Patient was given the office number and encouraged to call back with question or concerns.  : yes

## 2017-12-24 ENCOUNTER — Other Ambulatory Visit: Payer: Self-pay | Admitting: Physician Assistant

## 2017-12-24 DIAGNOSIS — M79604 Pain in right leg: Secondary | ICD-10-CM

## 2017-12-24 DIAGNOSIS — M79605 Pain in left leg: Secondary | ICD-10-CM

## 2017-12-26 ENCOUNTER — Other Ambulatory Visit: Payer: Self-pay

## 2017-12-26 ENCOUNTER — Ambulatory Visit: Payer: BLUE CROSS/BLUE SHIELD | Admitting: Physician Assistant

## 2017-12-26 ENCOUNTER — Encounter: Payer: Self-pay | Admitting: Physician Assistant

## 2017-12-26 VITALS — Temp 98.1°F | Ht 75.5 in | Wt 298.0 lb

## 2017-12-26 DIAGNOSIS — I10 Essential (primary) hypertension: Secondary | ICD-10-CM | POA: Diagnosis not present

## 2017-12-26 DIAGNOSIS — I639 Cerebral infarction, unspecified: Secondary | ICD-10-CM

## 2017-12-26 DIAGNOSIS — R531 Weakness: Secondary | ICD-10-CM

## 2017-12-26 DIAGNOSIS — I5032 Chronic diastolic (congestive) heart failure: Secondary | ICD-10-CM

## 2017-12-26 DIAGNOSIS — F4323 Adjustment disorder with mixed anxiety and depressed mood: Secondary | ICD-10-CM

## 2017-12-26 DIAGNOSIS — I6930 Unspecified sequelae of cerebral infarction: Secondary | ICD-10-CM | POA: Insufficient documentation

## 2017-12-26 DIAGNOSIS — R479 Unspecified speech disturbances: Secondary | ICD-10-CM

## 2017-12-26 DIAGNOSIS — E119 Type 2 diabetes mellitus without complications: Secondary | ICD-10-CM

## 2017-12-26 NOTE — Progress Notes (Signed)
TRANSITION OF CARE VISIT   Primary Care Physician: Harrison Mons, PA-C   Date of Admission: 12/16/2017 Date of Discharge: 12/18/2017  Discharged from: [facility]  Discharge Diagnosis:  TIA (though chart review indicates that this was a LEFT pontine infarct, and he still has effects, >24 hours after symptom onset). Dizziness Difficulty Speaking Type 2 diabetes   Summary of Admission:  This patient has several risk factors for cerebrovascular event: Coronary artery disease, congestive heart failure, diabetes, COPD.  He presented to the emergency department with dizziness and slurred speech that began at 4 PM the day prior to admission.  Symptoms have begun following an episode of getting very frustrated and upset at work.  He was admitted for evaluation of TIA versus CVA.  MRI revealed a 1 cm acute infarct of the left paramedian pons.  2D echo revealed grade 1 diastolic dysfunction, left ventricular ejection fraction 55-60%.  He received speech/OT/PT evaluations in hospital.  Labs revealed good control of diabetes, hemoglobin A1c was 5.5%.  Atorvastatin dose was increased from 40 mg to 80 mg due to LDL of 105, goal less than 70.  TODAY's VISIT  Patient/Caregiver self-reported problems/concerns:  Still feels sluggish and weak, tired. Trouble walking. Practices stairs at home. Thinks that increase in Lipitor dose contributes, as he's had milder symptoms like this since starting it. Using the bedside toilet as a shower chair. Hard to pronounce certain words. Has to slow down and concentrate. Nagging "slight headaches." Daily. Last about an hour. Acetaminophen helps sometimes.   Depression screen [facility] 2/9 12/26/2017 09/15/2017 08/05/2017 08/05/2017 06/13/2017  Decreased Interest 0 1 1 0 0  Down, Depressed, Hopeless 0 1 1 0 0  PHQ - 2 Score 0 2 2 0 0  Altered sleeping - 0 1 - -  Tired, decreased energy - 3 3 - -  Change in  appetite - 1 1 - -  Feeling bad or failure about yourself  - 1 0 - -  Trouble concentrating - 2 1 - -  Moving slowly or fidgety/restless - 2 1 - -  Suicidal thoughts - 0 0 - -  PHQ-9 Score - 11 9 - -  Difficult doing work/chores - Somewhat difficult Somewhat difficult - -     Patient Active Problem List   Diagnosis Date Noted  . TIA (transient ischemic attack) 12/16/2017  . Difficulty speaking   . Dizziness   . BMI 36.0-36.9,adult 08/05/2017  . Cholelithiasis 08/05/2017  . Bilateral plantar fasciitis 08/05/2017  . Situational mixed anxiety and depressive disorder 08/05/2017  . Bilateral primary osteoarthritis of knee 07/01/2017  . Asthma 01/26/2017  . Overactive bladder 01/21/2017  . BPH with obstruction/lower urinary tract symptoms 01/21/2017  . S/P appendectomy 01/07/2017  . Diverticulosis 11/24/2016  . Type 2 diabetes mellitus without complication, without long-term current use of insulin ([place]) 10/31/2016  . Aortic insufficiency 11/23/2012  . Chronic diastolic CHF (congestive heart failure) ([place]) 11/23/2012  . CAD (coronary artery disease) 11/27/2011  . OSA (obstructive sleep apnea) 11/11/2011  . Aortic root dilatation ([place])   . Hyperlipidemia 01/23/2011  . Hypertension 01/23/2011   Allergies  Allergen Reactions  . Ibuprofen Other (See Comments)  TOLD NOT TO TAKE-PER CARDS  . Iodinated Diagnostic Agents Hives    Hives started 4 days after cta chest was performed, minimal relief w/ benadryl x 3 days, uncertain if reaction was actually iv contrast or other allergen,allergy tests to follow.wife will make [place] aware of results//[email or web address]   Past Medical History:  Diagnosis Date  . Alcohol abuse   . Aortic root dilatation (HCC)    a. echo 4/12: EF 55-60%, mod to marked dilated ascending aortic root;   b. MRA 4/12: Aortic root 5.5 cm, dilation of left and right coronary cusps, trileaflet aortic valve  . Arthritis   . Asthma    ast attack in early 20's  . CAD (coronary  artery disease)   . Chest pain    Myoview 4/12: EF 62%, no ischemia or scar  . CHF (congestive heart failure) ([place])   . Chronic pain    back and hands  . Constipation   . COPD (chronic obstructive pulmonary disease) ([place])   . Depression   . Diabetes mellitus    type 2  . Drug use   . Enlarged prostate   . Fatigue   . GERD (gastroesophageal reflux disease)    modifies with diet  . Hx of echocardiogram    Echo (2/16):  Mod LVH, EF 55-60%, mild to mod AI, mod LAE, mild RAE, Aortic root 38 mm, Asc aorta 43 mm  . Hyperlipidemia   . Hypertension   . Joint pain   . Knee pain, bilateral   . OSA (obstructive sleep apnea) 2006   .  Wears at times  . Osteoarthritis   . Palpitations   . PONV (postoperative nausea and vomiting)   . SOB (shortness of breath)    Past Surgical History:  Procedure Laterality Date  . APPENDECTOMY  12/2016  . Colonscopy    . CORONARY ARTERY BYPASS GRAFT  05/07/2012   Procedure: CORONARY ARTERY BYPASS GRAFTING (CABG);  Surgeon: Grace Isaac, MD;  Location: [facility];  Service: Open Heart Surgery;  Laterality: N/A;  . KNEE ARTHROSCOPY Left 2010   Dr. Rosamaria Lints  . LAPAROSCOPIC APPENDECTOMY N/A 01/06/2017   Procedure: APPENDECTOMY LAPAROSCOPIC;  Surgeon: Ralene Ok, MD;  Location: [facility];  Service: General;  Laterality: N/A;  . Retactment of fingers     as a child , sewed with sewing machine- Index and middle finger  . UPPER GASTROINTESTINAL ENDOSCOPY      MEDICATIONS Prior to Admission medications   Medication Sig Start Date End Date Taking? Authorizing Provider  acetaminophen (TYLENOL) 500 MG tablet TAKE 1-2 TABS EVERY 6 HOURS AS NEEDED FOR PAIN 05/26/12  Yes Richardson Dopp T, PA-C  aspirin EC 325 MG EC tablet Take 1 tablet (325 mg total) by mouth at bedtime. 12/18/17  Yes Kayleen Memos, DO  atorvastatin (LIPITOR) 80 MG tablet Take 1 tablet (80 mg total) by mouth daily at 6 PM. 12/18/17  Yes Irene Pap N, DO  carvedilol (COREG) 12.5 MG tablet Take 1  tablet (12.5 mg total) by mouth 2 (two) times daily. 10/31/16  Yes Erskine Steinfeldt, PA-C  felodipine (PLENDIL) 10 MG 24 hr tablet TAKE ONE TABLET BY MOUTH DAILY 11/03/17  Yes Korinna Tat, PA-C  furosemide (LASIX) 40 MG tablet TAKE ONE TABLET BY MOUTH DAILY 11/03/17  Yes Tashan Kreitzer, PA-C  Lidocaine 0.5 % GEL Apply 1 application topically daily as needed (LEG PAIN).   Yes [provider]  lisinopril (PRINIVIL,ZESTRIL) 20 MG tablet TAKE ONE TABLET BY MOUTH DAILY  11/03/17  Yes Brandi Armato, PA-C  loratadine (CLARITIN) 10 MG tablet Take 10 mg by mouth at bedtime.    Yes [provider]  metFORMIN (GLUCOPHAGE) 1000 MG tablet Take 1,000 mg by mouth 2 (two) times daily with a meal.   Yes [provider]  mirabegron ER (MYRBETRIQ) 50 MG TB24 tablet Take 50 mg by mouth at bedtime.    Yes [provider]  promethazine (PHENERGAN) 25 MG tablet Take 1 tablet (25 mg total) by mouth every 6 (six) hours as needed for nausea or vomiting. 06/08/17  Yes Lawyer, Harrell Gave, PA-C  sertraline (ZOLOFT) 100 MG tablet Take 1 tablet (100 mg total) by mouth daily. 09/15/17  Yes Sefora Tietje, PA-C  tamsulosin (FLOMAX) 0.4 MG CAPS capsule Take 2 capsules (0.8 mg total) by mouth daily after supper. 09/15/17  Yes Harrison Mons, PA-C    Medication Reconciliation conducted with patient/caregiver?  Yes  New medications prescribed/discontinued upon discharge?  Yes: Increased atorvastatin dose from 40 mg to 80 mg.  Discontinuation of ibuprofen and tramadol.  Barriers identified related to medications:  None  LABS  Lab Reviewed: Yes  PHYSICAL EXAM:  Physical Exam  Constitutional: He is oriented to person, place, and time. He appears well-developed and well-nourished. He is active and cooperative. No distress.  Temp 98.1 F (36.7 C) (Oral)   Ht 6' 3.5" (1.918 m)   Wt 298 lb (135.2 kg)   SpO2 95%   BMI 36.76 kg/m   HENT:  Head: Normocephalic and atraumatic.  Right Ear:  Hearing normal.  Left Ear: Hearing normal.  Eyes: Conjunctivae are normal. No scleral icterus.  Neck: Normal range of motion. Neck supple. No thyromegaly present.  Cardiovascular: Normal rate, regular rhythm and normal heart sounds.  Pulses:      Radial pulses are 2+ on the right side, and 2+ on the left side.  Pulmonary/Chest: Effort normal and breath sounds normal.  Lymphadenopathy:       Head (right side): No tonsillar, no preauricular, no posterior auricular and no occipital adenopathy present.       Head (left side): No tonsillar, no preauricular, no posterior auricular and no occipital adenopathy present.    He has no cervical adenopathy.       Right: No supraclavicular adenopathy present.       Left: No supraclavicular adenopathy present.  Neurological: He is alert and oriented to person, place, and time. No sensory deficit.  Skin: Skin is warm, dry and intact. No rash noted. No cyanosis or erythema. Nails show no clubbing.  Psychiatric: He has a normal mood and affect. His speech is normal and behavior is normal.    Wt Readings from Last 3 Encounters:  12/26/17 298 lb (135.2 kg)  12/17/17 293 lb 14 oz (133.3 kg)  11/20/17 (!) 300 lb 3.2 oz (136.2 kg)    ASSESSMENT: Problem List Items Addressed This Visit    Hypertension    Controlled.  Continue current treatment.  Healthy lifestyle changes encouraged.  Consider medical weight management.      Relevant Orders   Care order/instruction: (Completed)   Chronic diastolic CHF (congestive heart failure) (HCC)    Stable.  He is scheduled for an echocardiogram later this month.  As he had an echo in hospital I suspect it would be appropriate to cancel the echocardiogram previously scheduled by cardiology.  Will reach out to cardiology to clarify.      Type 2 diabetes mellitus without complication, without long-term current use of insulin (  [place])    Well-controlled.  No changes.      Situational mixed anxiety and depressive  disorder    Stable.  Monitor carefully due to recent stroke.  Counseled that such an event can aggravate anxiety and depression.  Most of his symptoms were due to work, we may actually see some improvement with time away due to recent stroke.      Stroke [facility]) - Primary    Follow-up with neurology in 6-8 weeks as planned. Proceed with outpatient rehabilitation, beginning 12/29/2017.      Difficulty speaking    Secondary to left pontine infarct.  Anticipate continued improvement.      Right sided weakness    Due to left pontine infarct.  Proceed with physical and occupational therapy as scheduled beginning 12/29/2017.          Return in about 4 weeks (around 01/23/2018) for re-evaluation of blood pressure.   Fara Chute, PA-C [facility] [place]: See AVS   FOLLOW-UP:  Return in about 4 weeks (around 01/23/2018) for re-evaluation of blood pressure.

## 2017-12-26 NOTE — Patient Instructions (Addendum)
Keep up the [place]!    IF you received an x-ray today, you will receive an invoice from [facility]. Please contact [facility] at [phone] with questions or concerns regarding your invoice.   IF you received labwork today, you will receive an invoice from [facility]. Please contact [facility] at [phone] with questions or concerns regarding your invoice.   Our billing staff will not be able to assist you with questions regarding bills from these companies.  You will be contacted with the lab results as soon as they are available. The fastest way to get your results is to activate your My Chart account. Instructions are located on the last page of this paperwork. If you have not heard from [place] regarding the results in 2 weeks, please contact this office.

## 2017-12-27 NOTE — Assessment & Plan Note (Signed)
Secondary to left pontine infarct.  Anticipate continued improvement.

## 2017-12-27 NOTE — Assessment & Plan Note (Signed)
Stable.  He is scheduled for an echocardiogram later this month.  As he had an echo in hospital I suspect it would be appropriate to cancel the echocardiogram previously scheduled by cardiology.  Will reach out to cardiology to clarify.

## 2017-12-27 NOTE — Assessment & Plan Note (Addendum)
Follow-up with neurology in 6-8 weeks as planned. Proceed with outpatient rehabilitation, beginning 12/29/2017.

## 2017-12-27 NOTE — Assessment & Plan Note (Signed)
Well controlled. No changes. 

## 2017-12-27 NOTE — Assessment & Plan Note (Signed)
Controlled.  Continue current treatment.  Healthy lifestyle changes encouraged.  Consider medical weight management.

## 2017-12-27 NOTE — Assessment & Plan Note (Signed)
Due to left pontine infarct.  Proceed with physical and occupational therapy as scheduled beginning 12/29/2017.

## 2017-12-27 NOTE — Assessment & Plan Note (Signed)
Stable.  Monitor carefully due to recent stroke.  Counseled that such an event can aggravate anxiety and depression.  Most of his symptoms were due to work, we may actually see some improvement with time away due to recent stroke.

## 2017-12-28 ENCOUNTER — Encounter: Payer: Self-pay | Admitting: Physician Assistant

## 2017-12-28 ENCOUNTER — Telehealth: Payer: Self-pay | Admitting: Physician Assistant

## 2017-12-28 NOTE — Telephone Encounter (Signed)
Reviewed echo done in the hospital dated 12/17/17. This demonstrates normal LV function.  Size of aortic root and ascending aorta is stable compared to previous studies. Continue current medications and follow up as planned. Echo scheduled for later this month can be canceled.  Please cancel the echo.  Richardson Dopp, PA-C 12/28/2017 4:49 PM

## 2017-12-29 ENCOUNTER — Ambulatory Visit: Payer: BLUE CROSS/BLUE SHIELD | Attending: Internal Medicine | Admitting: Physical Therapy

## 2017-12-29 ENCOUNTER — Encounter: Payer: Self-pay | Admitting: Physical Therapy

## 2017-12-29 ENCOUNTER — Other Ambulatory Visit: Payer: Self-pay

## 2017-12-29 ENCOUNTER — Ambulatory Visit: Payer: BLUE CROSS/BLUE SHIELD | Admitting: Occupational Therapy

## 2017-12-29 DIAGNOSIS — R293 Abnormal posture: Secondary | ICD-10-CM | POA: Diagnosis present

## 2017-12-29 DIAGNOSIS — M6281 Muscle weakness (generalized): Secondary | ICD-10-CM | POA: Insufficient documentation

## 2017-12-29 DIAGNOSIS — R2681 Unsteadiness on feet: Secondary | ICD-10-CM | POA: Insufficient documentation

## 2017-12-29 DIAGNOSIS — I69351 Hemiplegia and hemiparesis following cerebral infarction affecting right dominant side: Secondary | ICD-10-CM | POA: Insufficient documentation

## 2017-12-29 DIAGNOSIS — R278 Other lack of coordination: Secondary | ICD-10-CM | POA: Diagnosis present

## 2017-12-29 DIAGNOSIS — R2689 Other abnormalities of gait and mobility: Secondary | ICD-10-CM

## 2017-12-29 NOTE — Therapy (Signed)
[facility] [address] [place], [place], [address] Phone: [phone]   Fax:  [phone]  Physical Therapy Evaluation  Patient Details  Name: Jonathan Miles MRN: [id number] Date of Birth: [birth year] Referring Provider: Antony Contras   Encounter Date: 12/29/2017  PT End of Session - 12/29/17 2127    Visit Number  1    Number of Visits  15    Date for PT Re-Evaluation  02/19/18    Authorization Type  [employer]; PT and OT combined limit 30 visits    Authorization - Visit Number  1    Authorization - Number of Visits  15 pt/ot agreed to split visits; max 30; continue to assess needs    PT Start Time  1018    PT Stop Time  1102    PT Time Calculation (min)  44 min    Activity Tolerance  Patient tolerated treatment well    Behavior During Therapy  [facility] for tasks assessed/performed       Past Medical History:  Diagnosis Date  . Alcohol abuse   . Aortic root dilatation (HCC)    a. echo 4/12: EF 55-60%, mod to marked dilated ascending aortic root;   b. MRA 4/12: Aortic root 5.5 cm, dilation of left and right coronary cusps, trileaflet aortic valve // Echo 2/19: Moderate LVH, EF 55-60, normal wall motion, grade 1 diastolic dysfunction, mild AI, mildly dilated aortic root, moderate to severe LAE (aortic root 40 mm; ascending aorta 39 mm)   . Arthritis   . Asthma    ast attack in early 20's  . CAD (coronary artery disease)   . Chest pain    Myoview 4/12: EF 62%, no ischemia or scar  . CHF (congestive heart failure) ([place])   . Chronic pain    back and hands  . Constipation   . COPD (chronic obstructive pulmonary disease) ([place])   . Depression   . Diabetes mellitus    type 2  . Drug use   . Enlarged prostate   . Fatigue   . GERD (gastroesophageal reflux disease)    modifies with diet  . Hx of echocardiogram    Echo (2/16):  Mod LVH, EF 55-60%, mild to mod AI, mod LAE, mild RAE, Aortic root 38 mm, Asc aorta 43 mm  .  Hyperlipidemia   . Hypertension   . Joint pain   . Knee pain, bilateral   . OSA (obstructive sleep apnea) 2006   .  Wears at times  . Osteoarthritis   . Palpitations   . PONV (postoperative nausea and vomiting)   . SOB (shortness of breath)     Past Surgical History:  Procedure Laterality Date  . APPENDECTOMY  12/2016  . Colonscopy    . CORONARY ARTERY BYPASS GRAFT  05/07/2012   Procedure: CORONARY ARTERY BYPASS GRAFTING (CABG);  Surgeon: Grace Isaac, MD;  Location: [facility];  Service: Open Heart Surgery;  Laterality: N/A;  . KNEE ARTHROSCOPY Left 2010   Dr. Rosamaria Lints  . LAPAROSCOPIC APPENDECTOMY N/A 01/06/2017   Procedure: APPENDECTOMY LAPAROSCOPIC;  Surgeon: Ralene Ok, MD;  Location: [facility];  Service: General;  Laterality: N/A;  . Retactment of fingers     as a child , sewed with sewing machine- Index and middle finger  . UPPER GASTROINTESTINAL ENDOSCOPY      There were no vitals filed for this visit.   Subjective Assessment - 12/29/17 1022    Subjective  I've got to get  my balance, strength, coordination back together.     Patient is accompained by:  Family member Denise    Pertinent History  CAD, CHF, HTN, COPD, DM, bil plant fasc, bil knee OA,     Patient Stated Goals  I've got to get my balance, strength, coordination back together; be able to return to work    Currently in Pain?  Yes    Pain Score  6     Pain Location  Knee thigh muscles    Pain Orientation  Right;Left left worst    Pain Descriptors / Indicators  Aching    Pain Type  Chronic pain    Pain Onset  More than a month ago    Pain Frequency  Intermittent    Aggravating Factors   sitting too long and get stiff and tight; but if move too much can also hurt    Pain Relieving Factors  walk around         [facility] PT Assessment - 12/29/17 1026      Assessment   Medical Diagnosis  CVA w/ Rt hemiparesis    Referring Provider  Antony Contras    Onset Date/Surgical Date  12/16/17    Hand Dominance   Right    Prior Therapy  acute care 2/20 - 12/18/17      Precautions   Precautions  Fall    Precaution Comments  no strenuous activity, no driving      Restrictions   Weight Bearing Restrictions  No      Balance Screen   Has the patient fallen in the past 6 months  Yes    How many times?  2 once tripped at work; once after stroke trying to go up step    Has the patient had a decrease in activity level because of a fear of falling?   No    Is the patient reluctant to leave their home because of a fear of falling?   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) ; job requires lifting 150# occasionally and or 50# frequently and apply 25# force    Leisure  bowling      Cognition   Overall Cognitive Status  Within Functional Limits for tasks assessed wife confirms his answers    Memory  --      Observation/Other Assessments   Observations  ambulating with SPC    Focus on Therapeutic Outcomes (FOTO)   FS 40/100; risk-adjusted 55/100      Sensation   Light Touch  Impaired by gross assessment    Additional Comments  some neuropathy bil feet prior to cva      Coordination   Gross Motor Movements are Fluid and Coordinated  Yes    Fine Motor Movements are Fluid and Coordinated  No rapid toe tapping slightly asynchronous    Heel Shin Test  WNL      Posture/Postural Control   Posture/Postural Control  Postural limitations    Postural Limitations  Forward head;Rounded Shoulders;Decreased lumbar  lordosis      ROM / Strength   AROM / PROM / Strength  AROM;Strength      AROM   Overall AROM   Deficits    Overall AROM Comments  Lt knee  lacks ~10 deg extension s/p surgery per pt      Strength   Overall Strength  Deficits corresponding LLE muscle groups 5/5 unless otherwise documen    Overall Strength Comments  --    Strength Assessment Site  Hip;Knee;Ankle    Right/Left Hip  Right    Right Hip Flexion  4-/5    Right Hip ABduction  4/5    Right/Left Knee  Right;Left    Right Knee Extension  3+/5    Left Knee Extension  4/5 primarily limited by knee pain    Right/Left Ankle  Right    Right Ankle Dorsiflexion  4/5      Bed Mobility   Bed Mobility  Rolling Right;Rolling Left;Supine to Sit;Sit to Supine    Rolling Right  6: Modified independent (Device/Increase time)    Rolling Left  6: Modified independent (Device/Increase time)    Supine to Sit  6: Modified independent (Device/Increase time);HOB flat incr effort and time    Sit to Supine  7: Independent      Transfers   Transfers  Sit to Stand;Stand to Sit    Sit to Stand  6: Modified independent (Device/Increase time);With upper extremity assist;From chair/3-in-1;From bed unable without UEs; ?primarily due to knee pain    Stand to Sit  6: Modified independent (Device/Increase time);With upper extremity assist      Ambulation/Gait   Ambulation/Gait  Yes    Ambulation/Gait Assistance  5: Supervision    Ambulation/Gait Assistance Details  wide base of support, imbalance with stagger on turn x 1    Ambulation Distance (Feet)  75 Feet 75    Assistive device  Straight cane    Gait Pattern  Step-through pattern;Decreased step length - left;Decreased stance time - right;Decreased weight shift to right;Left foot flat;Left flexed knee in stance;Antalgic;Wide base of support    Ambulation Surface  Indoor    Gait velocity  32.8/14.53=2.26      Standardized Balance Assessment   Standardized Balance Assessment  Berg Balance Test      Berg Balance Test   Sit to Stand  Able to stand  independently using hands    Standing Unsupported  Able to stand safely 2 minutes     Sitting with Back Unsupported but Feet Supported on Floor or Stool  Able to sit safely and securely 2 minutes    Stand to Sit  Sits safely with minimal use of hands    Transfers  Able to transfer safely, definite need of hands    Standing Unsupported with Eyes Closed  Able to stand 10 seconds safely    Standing Ubsupported with Feet Together  Able to place feet together independently and stand 1 minute safely    From Standing, Reach Forward with Outstretched Arm  Can reach forward >12 cm safely (5")    From Standing Position, Pick up Object from Floor  Able to pick up shoe safely and easily    From Standing Position, Turn to Look Behind Over each Shoulder  Looks behind one side only/other side shows less weight shift    Turn 360 Degrees  Able to turn 360 degrees safely but slowly    Standing Unsupported, Alternately Place Feet on Step/Stool  Able to stand independently and  safely and complete 8 steps in 20 seconds    Standing Unsupported, One Foot in Front  Able to plae foot ahead of the other independently and hold 30 seconds    Standing on One Leg  Tries to lift leg/unable to hold 3 seconds but remains standing independently    Total Score  46    Berg comment:  46-51 moderate (>50%)             Objective measurements completed on examination: See above findings.              PT Education - 12/29/17 2125    Education provided  Yes    Education Details  results of assessment and fall risk identified; PT POC; resources available online via [facility] if insurance pays for nutritionist, will likely need MD referral    Person(s) Educated  Patient;Spouse    Methods  Explanation    Comprehension  Verbalized understanding       PT Short Term Goals - 12/29/17 2141      PT SHORT TERM GOAL #1   Title  Patient will be independent with HEP addressing balance and LE strength (Target date 01/29/18)    Time  4    Period  Weeks    Status  New    Target Date  01/29/18       PT SHORT TERM GOAL #2   Title  Patient will improve gait velocity to >=2.62 ft/sec to demonstrate increased safety with community level ambulation and lesser fall risk.    Baseline  2.26 ft/sec    Time  4    Period  Weeks    Status  New      PT SHORT TERM GOAL #3   Title  Patient will improve Berg Balance score to >=52/56 to show decreased fall risk.    Time  4    Period  Weeks    Status  New      PT SHORT TERM GOAL #4   Title  Patient will ambulate 1000 ft over paved outdoor terrain with [place] and modified independent.    Time  4    Period  Weeks    Status  New      PT SHORT TERM GOAL #5   Title  Patient can ascend/descend 12 steps alternating pattern with SPC and supervision (demonstrating improved balance and control on stairs)    Time  4    Period  Weeks    Status  New        PT Long Term Goals - 12/29/17 2148      PT LONG TERM GOAL #1   Title  Patient will be independent with updated HEP and able to verbalize a plan for community-based activity upone discharge from PT.     Time  7    Period  Weeks    Status  New    Target Date  02/19/18      PT LONG TERM GOAL #2   Title  Patient will improve gait velocity to >= 3.0 ft/sec to show improved safety in community ambulation (and approaching normal velocity for male 59-59 yo)    Time  7    Period  Weeks    Status  New      PT LONG TERM GOAL #3   Title  Patient will ambulate 1000 ft over outdoor unlevel terrain (ramps, curbs, grass, gravel) with LRAD and no LOB.     Time  7  Period  Weeks    Status  New      PT LONG TERM GOAL #4   Title  Patient will ascend/descend 12 steps alternating pattern with no UE support.     Time  7    Period  Weeks    Status  New             Plan - 12/29/17 2131    Clinical Impression Statement  Patient referred for OPPT s/p left pons CVA with resulting rt sided weakness (dominant). 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 Patient's progression may be complicated by his >3 co-morbidities listed below. Patient can benefit from PT to address the deficits listed below via the PT interventions listed below.     History and Personal Factors relevant to plan of care:  PMH-CAD, CHF, HTN, COPD, DM, bil plant fasc, bil knee OA;  Personal factors-(positive) prior level of function, highly motivated, wife very supportive/involved    Clinical Presentation  Evolving    Clinical Presentation due to:  < 1 month since onset acute CVA    Clinical Decision Making  Moderate    Rehab Potential  Good    Clinical Impairments Affecting Rehab Potential  bil knee pain due to arthritis (left worst); peripheral neuropathy    PT Frequency  2x / week    PT Duration  Other (comment) 7 weeks due to visit limitation    PT Treatment/Interventions  ADLs/Self Care Home Management;Aquatic Therapy;Electrical Stimulation;DME Instruction;Gait training;Neuromuscular re-education;Balance training;Therapeutic exercise;Therapeutic activities;Functional mobility training;Stair training;Patient/family education;Orthotic Fit/Training;Passive range of motion    PT Next Visit Plan  check proper use of cane/cane height; initiate HEP (balance-note h/o neuropathy--and strength RLE) *Note pt reported he's doing walking inside home and step-ups on bottom step    Consulted and Agree with Plan of Care  Patient;Family member/caregiver    Family Member Consulted  wife       Patient will benefit from skilled therapeutic intervention in order to improve the following deficits and impairments:  Abnormal gait, Decreased balance, Decreased mobility, Decreased knowledge of use of DME, Decreased coordination, Decreased range of motion, Decreased strength, Impaired sensation, Postural dysfunction, Impaired UE functional use, Obesity, Pain  Visit Diagnosis: Unsteadiness on feet - Plan: PT plan of care cert/re-cert  Other abnormalities of gait and mobility -  Plan: PT plan of care cert/re-cert  Muscle weakness (generalized) - Plan: PT plan of care cert/re-cert  Abnormal posture - Plan: PT plan of care cert/re-cert     Problem List Patient Active Problem List   Diagnosis Date Noted  . Right sided weakness 12/26/2017  . Stroke ([place]) 12/16/2017  . Difficulty speaking   . BMI 36.0-36.9,adult 08/05/2017  . Cholelithiasis 08/05/2017  . Bilateral plantar fasciitis 08/05/2017  . Situational mixed anxiety and depressive disorder 08/05/2017  . Bilateral primary osteoarthritis of knee 07/01/2017  . Asthma 01/26/2017  . Overactive bladder 01/21/2017  . BPH with obstruction/lower urinary tract symptoms 01/21/2017  . S/P appendectomy 01/07/2017  . Diverticulosis 11/24/2016  . Type 2 diabetes mellitus without complication, without long-term current use of insulin ([place]) 10/31/2016  . Aortic insufficiency 11/23/2012  . Chronic diastolic CHF (congestive heart failure) ([place]) 11/23/2012  . CAD (coronary artery disease) 11/27/2011  . OSA (obstructive sleep apnea) 11/11/2011  . Aortic root dilatation ([place])   . Hyperlipidemia 01/23/2011  . Hypertension 01/23/2011    Rexanne Mano, PT 12/29/2017, 10:00 PM  [facility] [address] [place], [place], [address] Phone: [phone]   Fax:  [phone]  Name: Jonathan Miles MRN: [id number] Date of Birth: 1960/12/25

## 2017-12-29 NOTE — Therapy (Signed)
[facility] [address] [place], [place], [address] Phone: [phone]   Fax:  [phone]  Occupational Therapy Evaluation  Patient Details  Name: Jonathan Miles MRN: [id number] Date of Birth: 1961/01/01 Referring Provider: Antony Contras   Encounter Date: 12/29/2017  OT End of Session - 12/29/17 1134    Visit Number  1    Number of Visits  15    Date for OT Re-Evaluation  02/24/18    Authorization Type  BC/BS    Authorization - Visit Number  1    Authorization - Number of Visits  15    OT Start Time  0930    OT Stop Time  1015    OT Time Calculation (min)  45 min    Activity Tolerance  Patient tolerated treatment well    Behavior During Therapy  [facility] for tasks assessed/performed       Past Medical History:  Diagnosis Date  . Alcohol abuse   . Aortic root dilatation (HCC)    a. echo 4/12: EF 55-60%, mod to marked dilated ascending aortic root;   b. MRA 4/12: Aortic root 5.5 cm, dilation of left and right coronary cusps, trileaflet aortic valve // Echo 2/19: Moderate LVH, EF 55-60, normal wall motion, grade 1 diastolic dysfunction, mild AI, mildly dilated aortic root, moderate to severe LAE (aortic root 40 mm; ascending aorta 39 mm)   . Arthritis   . Asthma    ast attack in early 20's  . CAD (coronary artery disease)   . Chest pain    Myoview 4/12: EF 62%, no ischemia or scar  . CHF (congestive heart failure) ([place])   . Chronic pain    back and hands  . Constipation   . COPD (chronic obstructive pulmonary disease) ([place])   . Depression   . Diabetes mellitus    type 2  . Drug use   . Enlarged prostate   . Fatigue   . GERD (gastroesophageal reflux disease)    modifies with diet  . Hx of echocardiogram    Echo (2/16):  Mod LVH, EF 55-60%, mild to mod AI, mod LAE, mild RAE, Aortic root 38 mm, Asc aorta 43 mm  . Hyperlipidemia   . Hypertension   . Joint pain   . Knee pain, bilateral   . OSA (obstructive  sleep apnea) 2006   .  Wears at times  . Osteoarthritis   . Palpitations   . PONV (postoperative nausea and vomiting)   . SOB (shortness of breath)     Past Surgical History:  Procedure Laterality Date  . APPENDECTOMY  12/2016  . Colonscopy    . CORONARY ARTERY BYPASS GRAFT  05/07/2012   Procedure: CORONARY ARTERY BYPASS GRAFTING (CABG);  Surgeon: Grace Isaac, MD;  Location: [facility];  Service: Open Heart Surgery;  Laterality: N/A;  . KNEE ARTHROSCOPY Left 2010   Dr. Rosamaria Lints  . LAPAROSCOPIC APPENDECTOMY N/A 01/06/2017   Procedure: APPENDECTOMY LAPAROSCOPIC;  Surgeon: Ralene Ok, MD;  Location: [facility];  Service: General;  Laterality: N/A;  . Retactment of fingers     as a child , sewed with sewing machine- Index and middle finger  . UPPER GASTROINTESTINAL ENDOSCOPY      There were no vitals filed for this visit.  Subjective Assessment - 12/29/17 0934    Patient is accompained by:  Family member Wife    Pertinent History  CVA 12/16/17. PMH: CAD, CHF, COPD, DM, Bypass and aortic repair  2013    Limitations  no strenuous lifting, no driving    Patient Stated Goals  Get my hand stronger and increase coordination    Currently in Pain?  Yes    Pain Score  6     Pain Location  -- BOTH LEGS AND BACK - CHRONIC FROM ARTHRITIS (O.T. will not be addressing d/t outside scope of practice and not due to stroke)    Pain Descriptors / Indicators  Aching    Pain Type  Chronic pain    Pain Onset  More than a month ago    Pain Frequency  Constant    Aggravating Factors   standing too long    Pain Relieving Factors  walk around        [facility] OT Assessment - 12/29/17 0001      Assessment   Medical Diagnosis  CVA w/ Rt hemiparesis    Referring Provider  Dr. Leonie Man    Onset Date/Surgical Date  12/16/17    Hand Dominance  Right    Prior Therapy  acute care 2/20 - 12/18/17      Precautions   Precautions  Fall    Precaution Comments  no strenuous activity, no driving      Balance Screen    Has the patient fallen in the past 6 months  Yes    How many times?  2 P.T. to address      Home  Environment   Bathroom Shower/Tub  Tub/Shower unit;Curtain    Bathroom Accessibility  Yes    Home Equipment  Bedside commode;Walker - 2 wheels;Cane - single point    Additional Comments  Pt lives in 2 story home with bedroom/bathroom on 2nd floor. 1/2 bath on first floor. 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)     Leisure  bowling      ADL   Eating/Feeding  Modified independent    Grooming  Modified independent sometimes switches to Lt hand when fatigued    Upper Body Bathing  Modified independent    Lower Body Bathing  Modified independent    Upper Body Dressing  -- Mod I     Lower Body Dressing  Modified independent wears slip on shoes and slip on pants    Toilet Transfer  Modified independent w/ challenge    Toileting -  Hygiene  Modified Independent    Tub/Shower Transfer  Modified independent      IADL   Shopping  Needs to be accompanied on any shopping trip only been once and could only do short trip    Light Housekeeping  Does not participate in any housekeeping tasks currently    Meal Prep  Able to complete simple warm meal prep;Able to complete simple cold meal and snack prep Pt not cooking currently, but did about 25% prior to CVA    Community Mobility  Relies on family or friends for transportation since stroke    Medication Management  Is responsible for taking medication in correct dosages at correct time    Psychiatrist financial matters independently (budgets, writes checks, pays rent, bills goes to bank), collects and keeps track of income      Mobility   Mobility Status  Needs assist    Mobility Status Comments  Walks w/ [facility]      Written Expression   Dominant Hand  Right    Handwriting  90%  legible      Vision - History   Baseline Vision  Wears glasses all the time    Visual History  -- Astigmatism    Additional Comments  Since stroke, reported bluriness but has resolved now      Cognition   Memory  Appears intact      Sensation   Light Touch  Appears Intact and for localization    Additional Comments  Pt does report intermittent numbness in Rt hand       Coordination   9 Hole Peg Test  Right;Left    Right 9 Hole Peg Test  42.59 sec    Left 9 Hole Peg Test  24.65 sec      Edema   Edema  none      ROM / Strength   AROM / PROM / Strength  AROM;Strength      AROM   Overall AROM Comments  BUE AROM WNL's, however noted weakness end ranges RUE      Strength   Overall Strength Comments  LUE MMT grossly 4/5, RUE MMT grossly 3+/5      Hand Function   Right Hand Grip (lbs)  55 lbs    Left Hand Grip (lbs)  95 lbs                        OT Short Term Goals - 12/29/17 1140      OT SHORT TERM GOAL #1   Title  Independent with HEP (Coordination, putty Rt hand)     Time  4    Period  Weeks    Status  New    Target Date  01/29/18      OT SHORT TERM GOAL #2   Title  Pt to return to tying shoes, fastening, buttons, zippers on clothes independently    Time  4    Period  Weeks    Status  New      OT SHORT TERM GOAL #3   Title  Pt to improve coordination Rt hand as evidenced by performing 9 hole peg test in 35 sec. or under    Baseline  42.59 sec    Time  4    Period  Weeks    Status  New      OT SHORT TERM GOAL #4   Title  Pt to improve grip strength Rt hand to 65 lbs grip strength or greater     Baseline  55 lbs (Lt = 95 lbs)     Time  4    Period  Weeks    Status  New        OT Long Term Goals - 12/29/17 1143      OT LONG TERM GOAL #1   Title  Independent with updated strengthening HEP for BUE's    Time  7    Period  Weeks    Status  New    Target Date  02/24/18      OT LONG TERM GOAL #2   Title  Pt to return to cooking and  cleaning tasks mod I level     Time  7    Period  Weeks    Status  New      OT LONG TERM GOAL #3   Title  Pt to improve coordination Rt  hand as evidenced by performing 9 hole peg test in 28 sec. or less    Baseline  42.59 sec    Time  7    Period  Weeks    Status  New      OT LONG TERM GOAL #4   Title  Pt to perform challenging dynamic standing tasks w/o LOB    Time  7    Period  Weeks    Status  New            Plan - 12/29/17 1136    Clinical Impression Statement  Pt is a 57 y.o. male who presents to outpatient rehab s/p CVA 12/16/17 with mild Rt dominant hemiparesis. Pt with decreased coordination, grip strength Rt hand and generalized weakness BUE's. Pt also with decreased balance.     Occupational Profile and client history currently impacting functional performance  PMH: CAD, CHF, COPD, DM, Bypass sx 2013, chronic arthritis causing pain in back and legs. Current limitations decrease ability to use Rt hand as dominant hand and perform work related tasks    Occupational performance deficits (Please refer to evaluation for details):  ADL's;IADL's;Work;Leisure;Social Participation    Rehab Potential  Good    OT Frequency  2x / week    OT Duration  -- 7 weeks    Plan  coordination and putty HEP for Rt hand, BUE strengthening HEP (?theraband or light dumbbells)     Clinical Decision Making  Limited treatment options, no task modification necessary    Consulted and Agree with Plan of Care  Patient;Family member/caregiver    Family Member Consulted  spouse       Patient will benefit from skilled therapeutic intervention in order to improve the following deficits and impairments:  Decreased coordination, Decreased endurance, Impaired sensation, Decreased activity tolerance, Impaired UE functional use, Decreased mobility, Decreased strength  Visit Diagnosis: Hemiplegia and hemiparesis following cerebral infarction affecting right dominant side ([place]) - Plan: Ot plan of care  cert/re-cert  Other lack of coordination - Plan: Ot plan of care cert/re-cert  Muscle weakness (generalized) - Plan: Ot plan of care cert/re-cert  Unsteadiness on feet - Plan: Ot plan of care cert/re-cert    Problem List Patient Active Problem List   Diagnosis Date Noted  . Right sided weakness 12/26/2017  . Stroke ([place]) 12/16/2017  . Difficulty speaking   . BMI 36.0-36.9,adult 08/05/2017  . Cholelithiasis 08/05/2017  . Bilateral plantar fasciitis 08/05/2017  . Situational mixed anxiety and depressive disorder 08/05/2017  . Bilateral primary osteoarthritis of knee 07/01/2017  . Asthma 01/26/2017  . Overactive bladder 01/21/2017  . BPH with obstruction/lower urinary tract symptoms 01/21/2017  . S/P appendectomy 01/07/2017  . Diverticulosis 11/24/2016  . Type 2 diabetes mellitus without complication, without long-term current use of insulin ([place]) 10/31/2016  . Aortic insufficiency 11/23/2012  . Chronic diastolic CHF (congestive heart failure) ([place]) 11/23/2012  . CAD (coronary artery disease) 11/27/2011  . OSA (obstructive sleep apnea) 11/11/2011  . Aortic root dilatation ([place])   . Hyperlipidemia 01/23/2011  . Hypertension 01/23/2011    Carey Bullocks, OTR/L 12/29/2017, 11:47 AM  [facility] [address] [place], [place], [address] Phone: [phone]   Fax:  [phone]  Name: Jonathan Miles MRN: [id number] Date of Birth: [birth year]

## 2017-12-29 NOTE — Telephone Encounter (Signed)
Per Richardson Dopp, PA cancel echo. Recent echo done 12/17/17 at the hospital. Pt is aware though he needs to keep appt for U/S 3/13 at the [facility] office. Pt is agreeable..cmf

## 2017-12-30 ENCOUNTER — Telehealth: Payer: Self-pay | Admitting: *Deleted

## 2017-12-30 DIAGNOSIS — Z0289 Encounter for other administrative examinations: Secondary | ICD-10-CM

## 2017-12-30 NOTE — Telephone Encounter (Signed)
Rn spoke with patient to find out about his FMLA form. Pt states he is currently in PT, and Ot. He knows that he cannot return to work till he is cleared from [place]. Form will be done from hospital admission to appt on 02/03/2018 with Jessica stroke NP.Pt verbalized understanding. Rn explain to patient that forms take 7 to 10 business days. Pt stated the form is due on 01/06/2018.

## 2017-12-30 NOTE — Telephone Encounter (Signed)
Pt FMLA form on [employer].

## 2017-12-30 NOTE — Telephone Encounter (Signed)
Left vm for patient to call back about FMLA form. Rn needs to know if he wants it ongoing tor the hospital stay or when he returns to work.

## 2018-01-01 ENCOUNTER — Other Ambulatory Visit: Payer: Self-pay | Admitting: Physician Assistant

## 2018-01-01 DIAGNOSIS — I10 Essential (primary) hypertension: Secondary | ICD-10-CM

## 2018-01-03 ENCOUNTER — Encounter: Payer: Self-pay | Admitting: Physician Assistant

## 2018-01-04 ENCOUNTER — Other Ambulatory Visit: Payer: Self-pay | Admitting: Physician Assistant

## 2018-01-04 ENCOUNTER — Telehealth: Payer: Self-pay | Admitting: Physician Assistant

## 2018-01-04 MED ORDER — LIDOCAINE 0.5 % EX GEL: 1.0000 "application " | Freq: Every day | CUTANEOUS | 0 refills | Status: DC | PRN

## 2018-01-04 NOTE — Telephone Encounter (Signed)
Provider, ok to change to ointment? Please advise.

## 2018-01-04 NOTE — Progress Notes (Signed)
Done

## 2018-01-04 NOTE — Telephone Encounter (Signed)
Copied from [place]. Topic: Quick Communication - See Telephone Encounter >> Jan 04, 2018 11:35 AM Synthia Innocent wrote: CRM for notification. See Telephone encounter for: Pharmacy calling need verification on Lidocaine 0.5 % GEL, this is not prescription strength.  [place] insurance will only pay for 5% on ointment, not gel.  01/04/18.

## 2018-01-05 ENCOUNTER — Encounter: Payer: Self-pay | Admitting: Occupational Therapy

## 2018-01-05 ENCOUNTER — Ambulatory Visit: Payer: BLUE CROSS/BLUE SHIELD | Admitting: Physical Therapy

## 2018-01-05 ENCOUNTER — Ambulatory Visit: Payer: BLUE CROSS/BLUE SHIELD | Admitting: Occupational Therapy

## 2018-01-05 ENCOUNTER — Encounter: Payer: Self-pay | Admitting: Physical Therapy

## 2018-01-05 DIAGNOSIS — R2681 Unsteadiness on feet: Secondary | ICD-10-CM | POA: Diagnosis not present

## 2018-01-05 DIAGNOSIS — R278 Other lack of coordination: Secondary | ICD-10-CM

## 2018-01-05 DIAGNOSIS — I69351 Hemiplegia and hemiparesis following cerebral infarction affecting right dominant side: Secondary | ICD-10-CM

## 2018-01-05 DIAGNOSIS — M6281 Muscle weakness (generalized): Secondary | ICD-10-CM

## 2018-01-05 NOTE — Patient Instructions (Addendum)
Feet Heel-Toe "Tandem", Head Motion - Eyes Closed    With eyes closed and right foot directly in front of the other, move head slowly, up and down. Repeat __10__ times per session. Do _2___ sessions per day.  Copyright  VHI. All rights reserved.  Feet Together (Compliant Surface) Head Motion - Eyes Closed    Stand on compliant surface: __pillow______ with feet together. Close eyes and move head slowly, up and down. Repeat __10__ times per session. Do __2__ sessions per day.  Copyright  VHI. All rights reserved.  Wall Slide    Keep head, shoulders, and back against wall, with feet out in front and slightly wider than shoulder width. Slowly lower buttocks by sliding down wall until thighs are parallel to floor. Keep back flat. Repeat __10__ times per set. Do ___2_ sets per session. Do ___2_ sessions per day.  http://orth.exer.us/152   Copyright  VHI. All rights reserved.  Single Leg - Eyes Open    Holding support, lift right leg while maintaining balance over other leg. Progress to removing hands from support surface for longer periods of time. Hold__5-10__ seconds. Repeat _10__ times per session. Do _2___ sessions per day.  Copyright  VHI. All rights reserved.

## 2018-01-05 NOTE — Patient Instructions (Signed)
1. Grip Strengthening (Resistive Putty)   Squeeze putty using thumb and all fingers. Repeat _20___ times. Do __2__ sessions per day.   2. Roll putty into tube on table and pinch between thumb, finger and index finger.  Do 5 times. Do 2 sessions per day.   Coordination Activities  Perform the following activities for 15-20 minutes 1-2 times per day with right hand(s).   Rotate ball in fingertips (clockwise and counter-clockwise).  Toss ball between hands.  Toss ball in air and catch with the same hand.  Flip cards 1 at a time as fast as you can.  Deal cards with your thumb (Hold deck in hand and push card off top with thumb).  Pick up coins and place in container or coin bank. Start with pennies - can move to dimes if pennies get too easy  Pick up coins and stack.  Pick up coins one at a time until you get 5-10 in your hand, then move coins from palm to fingertips to stack one at a time.  Practice writing and/or typing.  Screw together nuts and bolts, then unfasten.  Use small nuts and bolts as bigger ones will be too easy for you. Can also try and add in a washer when doing this task.      Copyright  VHI. All rights reserved.

## 2018-01-05 NOTE — Therapy (Signed)
[facility] [address] [place], [place], [address] Phone: [phone]   Fax:  [phone]  Occupational Therapy Treatment  Patient Details  Name: Jonathan Miles MRN: [id number] Date of Birth: 06-Apr-1961 Referring Provider: Antony Contras   Encounter Date: 01/05/2018  OT End of Session - 01/05/18 1414    Visit Number  2    Number of Visits  15    Date for OT Re-Evaluation  02/24/18    Authorization Type  BC/BS    OT Start Time  1093    OT Stop Time  1359    OT Time Calculation (min)  42 min    Activity Tolerance  Patient tolerated treatment well       Past Medical History:  Diagnosis Date  . Alcohol abuse   . Aortic root dilatation (HCC)    a. echo 4/12: EF 55-60%, mod to marked dilated ascending aortic root;   b. MRA 4/12: Aortic root 5.5 cm, dilation of left and right coronary cusps, trileaflet aortic valve // Echo 2/19: Moderate LVH, EF 55-60, normal wall motion, grade 1 diastolic dysfunction, mild AI, mildly dilated aortic root, moderate to severe LAE (aortic root 40 mm; ascending aorta 39 mm)   . Arthritis   . Asthma    ast attack in early 20's  . CAD (coronary artery disease)   . Chest pain    Myoview 4/12: EF 62%, no ischemia or scar  . CHF (congestive heart failure) ([place])   . Chronic pain    back and hands  . Constipation   . COPD (chronic obstructive pulmonary disease) ([place])   . Depression   . Diabetes mellitus    type 2  . Drug use   . Enlarged prostate   . Fatigue   . GERD (gastroesophageal reflux disease)    modifies with diet  . Hx of echocardiogram    Echo (2/16):  Mod LVH, EF 55-60%, mild to mod AI, mod LAE, mild RAE, Aortic root 38 mm, Asc aorta 43 mm  . Hyperlipidemia   . Hypertension   . Joint pain   . Knee pain, bilateral   . OSA (obstructive sleep apnea) 2006   .  Wears at times  . Osteoarthritis   . Palpitations   . PONV (postoperative nausea and vomiting)   . SOB (shortness  of breath)     Past Surgical History:  Procedure Laterality Date  . APPENDECTOMY  12/2016  . Colonscopy    . CORONARY ARTERY BYPASS GRAFT  05/07/2012   Procedure: CORONARY ARTERY BYPASS GRAFTING (CABG);  Surgeon: Grace Isaac, MD;  Location: [facility];  Service: Open Heart Surgery;  Laterality: N/A;  . KNEE ARTHROSCOPY Left 2010   Dr. Rosamaria Lints  . LAPAROSCOPIC APPENDECTOMY N/A 01/06/2017   Procedure: APPENDECTOMY LAPAROSCOPIC;  Surgeon: Ralene Ok, MD;  Location: [facility];  Service: General;  Laterality: N/A;  . Retactment of fingers     as a child , sewed with sewing machine- Index and middle finger  . UPPER GASTROINTESTINAL ENDOSCOPY      There were no vitals filed for this visit.  Subjective Assessment - 01/05/18 1319    Subjective   I really want to go back to work as soon as I can    Patient is accompained by:  Family member wife Jonathan Miles    Pertinent History  CVA 12/16/17. PMH: CAD, CHF, COPD, DM, Bypass and aortic repair 2013    Limitations  no strenuous lifting, no  driving    Patient Stated Goals  Get my hand stronger and increase coordination    Currently in Pain?  Yes    Pain Score  3     Pain Location  Knee    Pain Orientation  Right;Left    Pain Descriptors / Indicators  Aching    Pain Type  Chronic pain    Pain Onset  More than a month ago    Pain Frequency  Intermittent    Aggravating Factors   sitting too long and getting stiff and tight, but if I move too much it can also hurt    Pain Relieving Factors  walk around                   OT Treatments/Exercises (OP) - 01/05/18 0001      ADLs   ADL Comments  Reviewed goals and OT POC - pt and wife in agreement.  Written copy provded to pt.  Pt reports that he feels very fatigued after doing simple things - discussed that this is very normal after a stroke and discussed importance of sleep in recovery. Pt reports he has had some nights that he was unable to sleep -pt advised to monitor this and to alert MD  if this becomes a pattern.  Pt and wife had questions regarding stroke and diet - answered all questions. Will provide handouts related to stroke next session.       Exercises   Exercises  Hand      Hand Exercises   Other Hand Exercises  Issued HEP for grip/pinch strength (blue putty) and coordination program.  Pt able to return demonstate after instruction and practice.  See pt instruction section for details.              OT Education - 01/05/18 1410    Education provided  Yes    Education Details  stroke, OT goals and POC, putty HEP and coordination HEP    Person(s) Educated  Patient;Spouse    Methods  Explanation;Demonstration;Verbal cues;Handout    Comprehension  Verbalized understanding;Returned demonstration       OT Short Term Goals - 01/05/18 1411      OT SHORT TERM GOAL #1   Title  Independent with HEP (Coordination, putty Rt hand)  - goals due 01/29/2018    Time  4    Period  Weeks    Status  On-going      OT SHORT TERM GOAL #2   Title  Pt to return to tying shoes, fastening, buttons, zippers on clothes independently    Time  4    Period  Weeks    Status  On-going      OT SHORT TERM GOAL #3   Title  Pt to improve coordination Rt hand as evidenced by performing 9 hole peg test in 35 sec. or under    Baseline  42.59 sec    Time  4    Period  Weeks    Status  On-going      OT SHORT TERM GOAL #4   Title  Pt to improve grip strength Rt hand to 65 lbs grip strength or greater     Baseline  55 lbs (Lt = 95 lbs)     Time  4    Period  Weeks    Status  On-going        OT Long Term Goals - 01/05/18 1412      OT LONG TERM  GOAL #1   Title  Independent with updated strengthening HEP for BUE's - goals due 02/24/2018    Time  7    Period  Weeks    Status  On-going      OT LONG TERM GOAL #2   Title  Pt to return to cooking and cleaning tasks mod I level     Time  7    Period  Weeks    Status  On-going      OT LONG TERM GOAL #3   Title  Pt to improve  coordination Rt hand as evidenced by performing 9 hole peg test in 28 sec. or less    Baseline  42.59 sec    Time  7    Period  Weeks    Status  On-going      OT LONG TERM GOAL #4   Title  Pt to perform challenging dynamic standing tasks w/o LOB    Time  7    Period  Weeks    Status  On-going            Plan - 01/05/18 1412    Clinical Impression Statement  Pt in agreement with POC and goals. Pt progressing toward goals.     Occupational Profile and client history currently impacting functional performance  PMH: CAD, CHF, COPD, DM, Bypass sx 2013, chronic arthritis causing pain in back and legs. Current limitations decrease ability to use Rt hand as dominant hand and perform work related tasks    Occupational performance deficits (Please refer to evaluation for details):  ADL's;IADL's;Work;Leisure;Social Participation    OT Frequency  2x / week    OT Duration  Other (comment) 7 weeks    Plan  check HEP for grip strngth and coordination, work on grip strength, add to HEP as appropriate, address activity tolerance, balance.     Consulted and Agree with Plan of Care  Patient;Family member/caregiver    Family Member Consulted  spouse       Patient will benefit from skilled therapeutic intervention in order to improve the following deficits and impairments:  Decreased coordination, Decreased endurance, Impaired sensation, Decreased activity tolerance, Impaired UE functional use, Decreased mobility, Decreased strength  Visit Diagnosis: Hemiplegia and hemiparesis following cerebral infarction affecting right dominant side (HCC)  Other lack of coordination  Unsteadiness on feet  Muscle weakness (generalized)    Problem List Patient Active Problem List   Diagnosis Date Noted  . Right sided weakness 12/26/2017  . Stroke ([place]) 12/16/2017  . Difficulty speaking   . BMI 36.0-36.9,adult 08/05/2017  . Cholelithiasis 08/05/2017  . Bilateral plantar fasciitis 08/05/2017  .  Situational mixed anxiety and depressive disorder 08/05/2017  . Bilateral primary osteoarthritis of knee 07/01/2017  . Asthma 01/26/2017  . Overactive bladder 01/21/2017  . BPH with obstruction/lower urinary tract symptoms 01/21/2017  . S/P appendectomy 01/07/2017  . Diverticulosis 11/24/2016  . Type 2 diabetes mellitus without complication, without long-term current use of insulin ([place]) 10/31/2016  . Aortic insufficiency 11/23/2012  . Chronic diastolic CHF (congestive heart failure) ([place]) 11/23/2012  . CAD (coronary artery disease) 11/27/2011  . OSA (obstructive sleep apnea) 11/11/2011  . Aortic root dilatation ([place])   . Hyperlipidemia 01/23/2011  . Hypertension 01/23/2011    Jonathan Miles, OTR/L 01/05/2018, 2:16 PM  [facility] [address] [place], [place], [address] Phone: [phone]   Fax:  [phone]  Name: Jonathan Miles MRN: [id number] Date of Birth: Jul 19, 1961

## 2018-01-06 ENCOUNTER — Other Ambulatory Visit (HOSPITAL_COMMUNITY): Payer: BLUE CROSS/BLUE SHIELD

## 2018-01-06 ENCOUNTER — Ambulatory Visit (HOSPITAL_COMMUNITY)
Admission: RE | Admit: 2018-01-06 | Discharge: 2018-01-06 | Disposition: A | Discharge status: Home / Self Care | Payer: BLUE CROSS/BLUE SHIELD | Source: Ambulatory Visit | Attending: Cardiovascular Disease | Admitting: Cardiovascular Disease

## 2018-01-06 DIAGNOSIS — M79605 Pain in left leg: Secondary | ICD-10-CM | POA: Diagnosis not present

## 2018-01-06 DIAGNOSIS — M79604 Pain in right leg: Secondary | ICD-10-CM | POA: Insufficient documentation

## 2018-01-06 NOTE — Telephone Encounter (Signed)
FMLA form complete given to [facility]. Pt paid fee on 12/29/2017 of 50.00 dollars.

## 2018-01-06 NOTE — Therapy (Signed)
[facility] [address] [place], [place], [address] Phone: [phone]   Fax:  [phone]  Physical Therapy Treatment  Patient Details  Name: Jonathan Miles MRN: [id number] Date of Birth: 09/14/1961 Referring Provider: Antony Contras   Encounter Date: 01/05/2018  PT End of Session - 01/05/18 1741    Visit Number  2    Number of Visits  15    Date for PT Re-Evaluation  02/19/18    Authorization Type  BCBS; PT and OT combined limit 30 visits    Authorization - Visit Number  2    Authorization - Number of Visits  15    PT Start Time  1400    PT Stop Time  1445    PT Time Calculation (min)  45 min    Equipment Utilized During Treatment  Gait belt    Activity Tolerance  Patient tolerated treatment well    Behavior During Therapy  [facility] for tasks assessed/performed       Past Medical History:  Diagnosis Date  . Alcohol abuse   . Aortic root dilatation (HCC)    a. echo 4/12: EF 55-60%, mod to marked dilated ascending aortic root;   b. MRA 4/12: Aortic root 5.5 cm, dilation of left and right coronary cusps, trileaflet aortic valve // Echo 2/19: Moderate LVH, EF 55-60, normal wall motion, grade 1 diastolic dysfunction, mild AI, mildly dilated aortic root, moderate to severe LAE (aortic root 40 mm; ascending aorta 39 mm)   . Arthritis   . Asthma    ast attack in early 20's  . CAD (coronary artery disease)   . Chest pain    Myoview 4/12: EF 62%, no ischemia or scar  . CHF (congestive heart failure) ([place])   . Chronic pain    back and hands  . Constipation   . COPD (chronic obstructive pulmonary disease) ([place])   . Depression   . Diabetes mellitus    type 2  . Drug use   . Enlarged prostate   . Fatigue   . GERD (gastroesophageal reflux disease)    modifies with diet  . Hx of echocardiogram    Echo (2/16):  Mod LVH, EF 55-60%, mild to mod AI, mod LAE, mild RAE, Aortic root 38 mm, Asc aorta 43 mm  . Hyperlipidemia     . Hypertension   . Joint pain   . Knee pain, bilateral   . OSA (obstructive sleep apnea) 2006   .  Wears at times  . Osteoarthritis   . Palpitations   . PONV (postoperative nausea and vomiting)   . SOB (shortness of breath)     Past Surgical History:  Procedure Laterality Date  . APPENDECTOMY  12/2016  . Colonscopy    . CORONARY ARTERY BYPASS GRAFT  05/07/2012   Procedure: CORONARY ARTERY BYPASS GRAFTING (CABG);  Surgeon: Grace Isaac, MD;  Location: [facility];  Service: Open Heart Surgery;  Laterality: N/A;  . KNEE ARTHROSCOPY Left 2010   Dr. Rosamaria Lints  . LAPAROSCOPIC APPENDECTOMY N/A 01/06/2017   Procedure: APPENDECTOMY LAPAROSCOPIC;  Surgeon: Ralene Ok, MD;  Location: [facility];  Service: General;  Laterality: N/A;  . Retactment of fingers     as a child , sewed with sewing machine- Index and middle finger  . UPPER GASTROINTESTINAL ENDOSCOPY      There were no vitals filed for this visit.  Subjective Assessment - 01/05/18 1447    Subjective  Pt reported no changes in  medications and no falls. Pt did mentioned his knee was hurting. Also stated, I work in [place] doing heavy pushing and pulling, crawling, and bending down on concrete floor.     Patient is accompained by:  Family member    Pertinent History  CAD, CHF, HTN, COPD, DM, bil plant fasc, bil knee OA,     Patient Stated Goals  I've got to get my balance, strength, coordination back together; be able to return to work    Currently in Pain?  Yes    Pain Score  3     Pain Location  Knee    Pain Orientation  Right;Left    Pain Descriptors / Indicators  Aching    Pain Onset  More than a month ago    Pain Frequency  Intermittent    Aggravating Factors   sitting too long and getting stiff and tight, but if I move too much it can also hurt     Pain Relieving Factors  ice pack, walking around                [facility] Adult PT Treatment/Exercise - 01/06/18 0725      Transfers   Transfers  Sit to Stand;Stand to  Sit    Sit to Stand  6: Modified independent (Device/Increase time);From chair/3-in-1;From bed    Stand to Sit  6: Modified independent (Device/Increase time);With upper extremity assist    Comments  Pt able to perform 10 reps of sit to stands from chair without using any UE assist. Pt required verbal and demonstration cues for proper biomechanics, energy conservation, eccentric lowering, and forward trunk leaning to gain momentum.       Ambulation/Gait   Ambulation/Gait  Yes    Ambulation/Gait Assistance  5: Supervision    Ambulation/Gait Assistance Details  wide base of support     Ambulation Distance (Feet)  75 Feet    Assistive device  None    Gait Pattern  Step-through pattern;Decreased step length - left;Decreased stance time - right;Decreased weight shift to right;Left foot flat;Left flexed knee in stance;Antalgic;Wide base of support    Ambulation Surface  Indoor      High Level Balance   High Level Balance Activities  Marching forwards;Marching backwards;Tandem walking;Side stepping;Backward walking    High Level Balance Comments  Pt performed all activities while walking on red and blue mats x 3 reps each along the countertop for single UE support, to an intermediate single UE support. Walking forward and backwards, tandem walking forward and backwards, marching forward and backwards, side stepping left and right. Performed for B LE strengthening and balance training. Followed by tall kneeling x 2 reps each : forward walking, backward walking, side to side walking. Performed to simulate pt positions at his workplace, LE strengthening, balance training. Pt was able to maintain proper posture during tall kneeling exercises, only required initial demonstration cues for performance of task.            Balance Exercises - 01/06/18 0735      Balance Exercises: Standing   Standing Eyes Opened  Narrow base of support (BOS);Foam/compliant surface;30 secs;2 reps;Head turns;Wide (BOA)  standing on two pillows    Standing Eyes Closed  Narrow base of support (BOS);Wide (BOA);Head turns;Foam/compliant surface;30 secs;2 reps;Other (comment) standing on two pillows    Tandem Stance  Eyes closed;Eyes open;Intermittent upper extremity support;2 reps;30 secs    SLS  Solid surface;Eyes open;Intermittent upper extremity support;10 secs;5 reps      Balance  Exercises: Standing   Standing Eyes Closed Limitations  Pt performed standing balance exercises on two pillows for 2 reps x 30 seconds bilaterally. Began task with eyes open until pt felt stable with balance. EO tandem stance, EC tandem stance, EO narrow BOS, EC narrow BOS, EC narrow BOS, EC narrow BOS with head turns up/down and then left/right and then diagonal.  Pt had most difficult with tandem stance and exhibited swaying in various directions. Pt was able to get his COG after using UE hold from the chair back and then progressed to letting go to use no UE support.     SLS Limitations  Pt performed single leg stance with eyes open, holding countertop with two finger holds bilaterally. Progressed to not holding counter at all. Pt held position for 5-10 seconds before using steppage strategy x 5 reps bilateral.         PT Education - 01/05/18 1739    Education provided  Yes    Education Details  HEP: tandem with EC, narrow BOS, wall slides, and SLS    Person(s) Educated  Patient;Spouse    Methods  Explanation;Handout;Demonstration    Comprehension  Verbalized understanding;Returned demonstration      HEP handout provided.   Feet Heel-Toe "Tandem", Head Motion - Eyes Closed    With eyes closed and right foot directly in front of the other, move head slowly, up and down. Repeat __10__ times per session. Do _2___ sessions per day.  Copyright  VHI. All rights reserved.  Feet Together (Compliant Surface) Head Motion - Eyes Closed    Stand on compliant surface: __pillow______ with feet together. Close eyes and move head  slowly, up and down. Repeat __10__ times per session. Do __2__ sessions per day.  Copyright  VHI. All rights reserved.  Wall Slide    Keep head, shoulders, and back against wall, with feet out in front and slightly wider than shoulder width. Slowly lower buttocks by sliding down wall until thighs are parallel to floor. Keep back flat. Repeat __10__ times per set. Do ___2_ sets per session. Do ___2_ sessions per day.  http://orth.exer.us/152   Copyright  VHI. All rights reserved.  Single Leg - Eyes Open    Holding support, lift right leg while maintaining balance over other leg. Progress to removing hands from support surface for longer periods of time. Hold__5-10__ seconds. Repeat _10__ times per session. Do _2___ sessions per day.  Copyright  VHI. All rights reserved.     PT Short Term Goals - 12/29/17 2141      PT SHORT TERM GOAL #1   Title  Patient will be independent with HEP addressing balance and LE strength (Target date 01/29/18)    Time  4    Period  Weeks    Status  New    Target Date  01/29/18      PT SHORT TERM GOAL #2   Title  Patient will improve gait velocity to >=2.62 ft/sec to demonstrate increased safety with community level ambulation and lesser fall risk.    Baseline  2.26 ft/sec    Time  4    Period  Weeks    Status  New      PT SHORT TERM GOAL #3   Title  Patient will improve Berg Balance score to >=52/56 to show decreased fall risk.    Time  4    Period  Weeks    Status  New      PT SHORT TERM GOAL #  4   Title  Patient will ambulate 1000 ft over paved outdoor terrain with [facility] and modified independent.    Time  4    Period  Weeks    Status  New      PT SHORT TERM GOAL #5   Title  Patient can ascend/descend 12 steps alternating pattern with SPC and supervision (demonstrating improved balance and control on stairs)    Time  4    Period  Weeks    Status  New        PT Long Term Goals - 12/29/17 2148      PT LONG TERM GOAL #1   Title   Patient will be independent with updated HEP and able to verbalize a plan for community-based activity upone discharge from PT.     Time  7    Period  Weeks    Status  New    Target Date  02/19/18      PT LONG TERM GOAL #2   Title  Patient will improve gait velocity to >= 3.0 ft/sec to show improved safety in community ambulation (and approaching normal velocity for male 36-59 yo)    Time  7    Period  Weeks    Status  New      PT LONG TERM GOAL #3   Title  Patient will ambulate 1000 ft over outdoor unlevel terrain (ramps, curbs, grass, gravel) with LRAD and no LOB.     Time  7    Period  Weeks    Status  New      PT LONG TERM GOAL #4   Title  Patient will ascend/descend 12 steps alternating pattern with no UE support.     Time  7    Period  Weeks    Status  New            Plan - 01/05/18 1741    Clinical Impression Statement  Today's skilled PT session focused on initiation of HEP for RLE strengthening, improving static balance on uneven surfaces, and dynamic balance. Also, began to practice exercises in tall kneeling positions to incorporate similar positions the pt performs at his workplace. Pt in making progression towards STG's and verbalizes motivation to want to get better, Pt would benefit from continued skilled PT interventions towards reaching unmet PT goals.     History and Personal Factors relevant to plan of care:  PMH-CAD, CHF, HTN, COPD, DM, bil plant fasc, bil knee OA, Personal factors- (positive) prior level of function, highly motivated, wife very supportive/involved    Clinical Presentation  Evolving    Clinical Presentation due to:  <1 month since onset acute CVA    Clinical Decision Making  Moderate    Rehab Potential  Good    Clinical Impairments Affecting Rehab Potential  bil knee pain due to arthritis (left worst); peripheral neuropathy    PT Frequency  2x / week    PT Duration  Other (comment)    PT Treatment/Interventions  ADLs/Self Care Home  Management;Aquatic Therapy;Electrical Stimulation;DME Instruction;Gait training;Neuromuscular re-education;Balance training;Therapeutic exercise;Therapeutic activities;Functional mobility training;Stair training;Patient/family education;Orthotic Fit/Training;Passive range of motion    PT Next Visit Plan  Follow up on HEP provided today; check proper use of cane/cane height; Progress balance exercises static and dynamic walking on mats; tall kneeling exercises.     Consulted and Agree with Plan of Care  Patient;Family member/caregiver    Family Member Consulted  wife       Patient will  benefit from skilled therapeutic intervention in order to improve the following deficits and impairments:  Abnormal gait, Decreased balance, Decreased mobility, Decreased knowledge of use of DME, Decreased coordination, Decreased range of motion, Decreased strength, Impaired sensation, Postural dysfunction, Impaired UE functional use, Obesity, Pain  Visit Diagnosis: Unsteadiness on feet  Hemiplegia and hemiparesis following cerebral infarction affecting right dominant side (HCC)  Other lack of coordination     Problem List Patient Active Problem List   Diagnosis Date Noted  . Right sided weakness 12/26/2017  . Stroke ([place]) 12/16/2017  . Difficulty speaking   . BMI 36.0-36.9,adult 08/05/2017  . Cholelithiasis 08/05/2017  . Bilateral plantar fasciitis 08/05/2017  . Situational mixed anxiety and depressive disorder 08/05/2017  . Bilateral primary osteoarthritis of knee 07/01/2017  . Asthma 01/26/2017  . Overactive bladder 01/21/2017  . BPH with obstruction/lower urinary tract symptoms 01/21/2017  . S/P appendectomy 01/07/2017  . Diverticulosis 11/24/2016  . Type 2 diabetes mellitus without complication, without long-term current use of insulin ([place]) 10/31/2016  . Aortic insufficiency 11/23/2012  . Chronic diastolic CHF (congestive heart failure) ([place]) 11/23/2012  . CAD (coronary artery disease)  11/27/2011  . OSA (obstructive sleep apnea) 11/11/2011  . Aortic root dilatation ([place])   . Hyperlipidemia 01/23/2011  . Hypertension 01/23/2011    Carlena Sax, SPTA 01/06/2018, 7:54 AM  [facility] [address] [place], [place], [address] Phone: [phone]   Fax:  [phone]  Name: Erickson Yamashiro MRN: [id number] Date of Birth: Sep 25, 1961

## 2018-01-07 ENCOUNTER — Encounter: Payer: Self-pay | Admitting: Physician Assistant

## 2018-01-07 ENCOUNTER — Encounter: Payer: Self-pay | Admitting: Rehabilitative and Restorative Service Providers"

## 2018-01-07 ENCOUNTER — Ambulatory Visit: Payer: BLUE CROSS/BLUE SHIELD | Admitting: Rehabilitative and Restorative Service Providers"

## 2018-01-07 ENCOUNTER — Encounter: Payer: Self-pay | Admitting: Occupational Therapy

## 2018-01-07 ENCOUNTER — Ambulatory Visit: Payer: BLUE CROSS/BLUE SHIELD | Admitting: Occupational Therapy

## 2018-01-07 DIAGNOSIS — R2681 Unsteadiness on feet: Secondary | ICD-10-CM

## 2018-01-07 DIAGNOSIS — M6281 Muscle weakness (generalized): Secondary | ICD-10-CM

## 2018-01-07 DIAGNOSIS — R278 Other lack of coordination: Secondary | ICD-10-CM

## 2018-01-07 DIAGNOSIS — R293 Abnormal posture: Secondary | ICD-10-CM

## 2018-01-07 DIAGNOSIS — I69351 Hemiplegia and hemiparesis following cerebral infarction affecting right dominant side: Secondary | ICD-10-CM

## 2018-01-07 DIAGNOSIS — R2689 Other abnormalities of gait and mobility: Secondary | ICD-10-CM

## 2018-01-07 NOTE — Therapy (Signed)
[facility] [address] [place], [place], [address] Phone: [phone]   Fax:  [phone]  Occupational Therapy Treatment  Patient Details  Name: Jonathan Miles MRN: [id number] Date of Birth: [birth year] Referring Provider: Antony Contras   Encounter Date: 01/07/2018  OT End of Session - 01/07/18 1052    Visit Number  3    Number of Visits  15    Date for OT Re-Evaluation  02/24/18    Authorization Type  BC/BS - pt has 30 visits combined between PT and OT (each visit counts)    OT Start Time  1016    OT Stop Time  1058    OT Time Calculation (min)  42 min    Activity Tolerance  Patient tolerated treatment well       Past Medical History:  Diagnosis Date  . Alcohol abuse   . Aortic root dilatation (HCC)    a. echo 4/12: EF 55-60%, mod to marked dilated ascending aortic root;   b. MRA 4/12: Aortic root 5.5 cm, dilation of left and right coronary cusps, trileaflet aortic valve // Echo 2/19: Moderate LVH, EF 55-60, normal wall motion, grade 1 diastolic dysfunction, mild AI, mildly dilated aortic root, moderate to severe LAE (aortic root 40 mm; ascending aorta 39 mm)   . Arthritis   . Asthma    ast attack in early 20's  . CAD (coronary artery disease)   . Chest pain    Myoview 4/12: EF 62%, no ischemia or scar  . CHF (congestive heart failure) ([place])   . Chronic pain    back and hands  . Constipation   . COPD (chronic obstructive pulmonary disease) ([place])   . Depression   . Diabetes mellitus    type 2  . Drug use   . Enlarged prostate   . Fatigue   . GERD (gastroesophageal reflux disease)    modifies with diet  . Hx of echocardiogram    Echo (2/16):  Mod LVH, EF 55-60%, mild to mod AI, mod LAE, mild RAE, Aortic root 38 mm, Asc aorta 43 mm  . Hyperlipidemia   . Hypertension   . Joint pain   . Knee pain, bilateral   . OSA (obstructive sleep apnea) 2006   .  Wears at times  . Osteoarthritis   . Palpitations    . PONV (postoperative nausea and vomiting)   . SOB (shortness of breath)     Past Surgical History:  Procedure Laterality Date  . APPENDECTOMY  12/2016  . Colonscopy    . CORONARY ARTERY BYPASS GRAFT  05/07/2012   Procedure: CORONARY ARTERY BYPASS GRAFTING (CABG);  Surgeon: Grace Isaac, MD;  Location: [facility];  Service: Open Heart Surgery;  Laterality: N/A;  . KNEE ARTHROSCOPY Left 2010   Dr. Rosamaria Lints  . LAPAROSCOPIC APPENDECTOMY N/A 01/06/2017   Procedure: APPENDECTOMY LAPAROSCOPIC;  Surgeon: Ralene Ok, MD;  Location: [facility];  Service: General;  Laterality: N/A;  . Retactment of fingers     as a child , sewed with sewing machine- Index and middle finger  . UPPER GASTROINTESTINAL ENDOSCOPY      There were no vitals filed for this visit.  Subjective Assessment - 01/07/18 1021    Subjective   My back flairs up once in awhile its an old problem, the tylenol and cream will help.    Pertinent History  CVA 12/16/17. PMH: CAD, CHF, COPD, DM, Bypass and aortic repair 2013    Limitations  no strenuous lifting, no driving    Patient Stated Goals  Get my hand stronger and increase coordination    Currently in Pain?  Yes    Pain Score  6     Pain Location  Back    Pain Orientation  Lower    Pain Descriptors / Indicators  Burning    Pain Type  Chronic pain    Pain Onset  More than a month ago    Pain Frequency  Intermittent    Aggravating Factors   sitting too long, over exertion    Pain Relieving Factors  walking around, tylenol, lidocaine cream                   OT Treatments/Exercises (OP) - 01/07/18 0001      ADLs   ADL Comments  Reveiwed HEP - pt reports the has done some of the coordination but did not have time to do grip strength.  Discussed importance of compliance with HEP for pt to reach goals. Pt verbalized understanding and stated "I think I can do all of them by next visit."       Exercises   Exercises  Hand      Hand Exercises   Hand Gripper with  Small Beads  Gripper on #3 to pick up one inch blocks - pt required 2 rest breaks.Pt with minimal dropping.   Also addressed stacking and unstacking blocks with gripper (#3) - pt with minimal difficulty.  Pt needed 3 rest breaks.  Pt rated fatigue 7/10 after all activities. Also addressed in hand manipululation and coordination using grooved pegboard. Pt with minimal difficulty.  Focused on accuracy and not speed at this time. Pt able to complete with increased time. Also addressed coordination with [place] dexterity (without tweezers) - pt completed without difficulty.              OT Short Term Goals - 01/07/18 1051      OT SHORT TERM GOAL #1   Title  Independent with HEP (Coordination, putty Rt hand)  - goals due 01/29/2018    Time  4    Period  Weeks    Status  On-going      OT SHORT TERM GOAL #2   Title  Pt to return to tying shoes, fastening, buttons, zippers on clothes independently    Time  4    Period  Weeks    Status  On-going      OT SHORT TERM GOAL #3   Title  Pt to improve coordination Rt hand as evidenced by performing 9 hole peg test in 35 sec. or under    Baseline  42.59 sec    Time  4    Period  Weeks    Status  On-going      OT SHORT TERM GOAL #4   Title  Pt to improve grip strength Rt hand to 65 lbs grip strength or greater     Baseline  55 lbs (Lt = 95 lbs)     Time  4    Period  Weeks    Status  On-going        OT Long Term Goals - 01/07/18 1051      OT LONG TERM GOAL #1   Title  Independent with updated strengthening HEP for BUE's - goals due 02/24/2018    Time  7    Period  Weeks    Status  On-going      OT  LONG TERM GOAL #2   Title  Pt to return to cooking and cleaning tasks mod I level     Time  7    Period  Weeks    Status  On-going      OT LONG TERM GOAL #3   Title  Pt to improve coordination Rt hand as evidenced by performing 9 hole peg test in 28 sec. or less    Baseline  42.59 sec    Time  7    Period  Weeks    Status  On-going       OT LONG TERM GOAL #4   Title  Pt to perform challenging dynamic standing tasks w/o LOB    Time  7    Period  Weeks    Status  On-going            Plan - 01/07/18 1051    Clinical Impression Statement  Pt progressing toward goals. Pt reports it is getting easier to do buttons at home.    Occupational Profile and client history currently impacting functional performance  PMH: CAD, CHF, COPD, DM, Bypass sx 2013, chronic arthritis causing pain in back and legs. Current limitations decrease ability to use Rt hand as dominant hand and perform work related tasks    Occupational performance deficits (Please refer to evaluation for details):  ADL's;IADL's;Work;Leisure;Social Participation    Rehab Potential  Good    OT Frequency  2x / week    OT Duration  Other (comment) 7 weeks    Plan  check HEP for grip strngth and coordination, work on grip strength, add to HEP as appropriate, address activity tolerance, balance.     Consulted and Agree with Plan of Care  Patient       Patient will benefit from skilled therapeutic intervention in order to improve the following deficits and impairments:  Decreased coordination, Decreased endurance, Impaired sensation, Decreased activity tolerance, Impaired UE functional use, Decreased mobility, Decreased strength  Visit Diagnosis: Hemiplegia and hemiparesis following cerebral infarction affecting right dominant side (HCC)  Other lack of coordination  Unsteadiness on feet  Muscle weakness (generalized)    Problem List Patient Active Problem List   Diagnosis Date Noted  . Right sided weakness 12/26/2017  . Stroke ([place]) 12/16/2017  . Difficulty speaking   . BMI 36.0-36.9,adult 08/05/2017  . Cholelithiasis 08/05/2017  . Bilateral plantar fasciitis 08/05/2017  . Situational mixed anxiety and depressive disorder 08/05/2017  . Bilateral primary osteoarthritis of knee 07/01/2017  . Asthma 01/26/2017  . Overactive bladder 01/21/2017  . BPH with  obstruction/lower urinary tract symptoms 01/21/2017  . S/P appendectomy 01/07/2017  . Diverticulosis 11/24/2016  . Type 2 diabetes mellitus without complication, without long-term current use of insulin ([place]) 10/31/2016  . Aortic insufficiency 11/23/2012  . Chronic diastolic CHF (congestive heart failure) ([place]) 11/23/2012  . CAD (coronary artery disease) 11/27/2011  . OSA (obstructive sleep apnea) 11/11/2011  . Aortic root dilatation ([place])   . Hyperlipidemia 01/23/2011  . Hypertension 01/23/2011    Quay Burow, OTR/L 01/07/2018, 12:15 PM  [facility] [address] [place], [place], [address] Phone: [phone]   Fax:  [phone]  Name: Jonathan Miles MRN: [id number] Date of Birth: 03/29/1961

## 2018-01-07 NOTE — Therapy (Signed)
[facility] [address] [place], [place], [address] Phone: [phone]   Fax:  [phone]  Physical Therapy Treatment  Patient Details  Name: Jonathan Miles MRN: [id number] Date of Birth: 20-Jul-1961 Referring Provider: Antony Contras   Encounter Date: 01/07/2018  PT End of Session - 01/07/18 1256    Visit Number  3    Number of Visits  15    Date for PT Re-Evaluation  02/19/18    Authorization Type  BCBS; PT and OT combined limit 30 visits    Authorization - Visit Number  3    Authorization - Number of Visits  15    PT Start Time  1102    PT Stop Time  1146    PT Time Calculation (min)  44 min    Equipment Utilized During Treatment  Gait belt    Activity Tolerance  Patient tolerated treatment well    Behavior During Therapy  WFL for tasks assessed/performed       Past Medical History:  Diagnosis Date  . Alcohol abuse   . Aortic root dilatation (HCC)    a. echo 4/12: EF 55-60%, mod to marked dilated ascending aortic root;   b. MRA 4/12: Aortic root 5.5 cm, dilation of left and right coronary cusps, trileaflet aortic valve // Echo 2/19: Moderate LVH, EF 55-60, normal wall motion, grade 1 diastolic dysfunction, mild AI, mildly dilated aortic root, moderate to severe LAE (aortic root 40 mm; ascending aorta 39 mm)   . Arthritis   . Asthma    ast attack in early 20's  . CAD (coronary artery disease)   . Chest pain    Myoview 4/12: EF 62%, no ischemia or scar  . CHF (congestive heart failure) ([place])   . Chronic pain    back and hands  . Constipation   . COPD (chronic obstructive pulmonary disease) ([place])   . Depression   . Diabetes mellitus    type 2  . Drug use   . Enlarged prostate   . Fatigue   . GERD (gastroesophageal reflux disease)    modifies with diet  . Hx of echocardiogram    Echo (2/16):  Mod LVH, EF 55-60%, mild to mod AI, mod LAE, mild RAE, Aortic root 38 mm, Asc aorta 43 mm  . Hyperlipidemia    . Hypertension   . Joint pain   . Knee pain, bilateral   . OSA (obstructive sleep apnea) 2006   .  Wears at times  . Osteoarthritis   . Palpitations   . PONV (postoperative nausea and vomiting)   . SOB (shortness of breath)     Past Surgical History:  Procedure Laterality Date  . APPENDECTOMY  12/2016  . Colonscopy    . CORONARY ARTERY BYPASS GRAFT  05/07/2012   Procedure: CORONARY ARTERY BYPASS GRAFTING (CABG);  Surgeon: Grace Isaac, MD;  Location: [facility];  Service: Open Heart Surgery;  Laterality: N/A;  . KNEE ARTHROSCOPY Left 2010   Dr. Rosamaria Lints  . LAPAROSCOPIC APPENDECTOMY N/A 01/06/2017   Procedure: APPENDECTOMY LAPAROSCOPIC;  Surgeon: Ralene Ok, MD;  Location: [facility];  Service: General;  Laterality: N/A;  . Retactment of fingers     as a child , sewed with sewing machine- Index and middle finger  . UPPER GASTROINTESTINAL ENDOSCOPY      There were no vitals filed for this visit.  Subjective Assessment - 01/07/18 1104    Subjective  The patient notes using walker if  he has to get up quickly.  He describes a lightheaded, off balance sensation when rising.  He notes if he has to walk quickly to the bathroom he notes imbalance.   Has not had a chance to do HEP yet due to appts    Pertinent History  CAD, CHF, HTN, COPD, DM, bil plant fasc, bil knee OA,     Patient Stated Goals  I've got to get my balance, strength, coordination back together; be able to return to work    Currently in Pain?  Yes    Pain Score  6     Pain Location  Back    Pain Orientation  Lower    Pain Descriptors / Indicators  Aching    Pain Type  Chronic pain    Pain Onset  More than a month ago    Pain Frequency  Intermittent    Aggravating Factors   on his feet    Pain Relieving Factors  uses lidocaine cream to help         [facility] PT Assessment - 01/07/18 1133      Ambulation/Gait   Ambulation/Gait Assistance Details  Outdoor surfaces x 300 ft, indoors >500 ft nonstop with dynamic tasks.     Assistive device  None    Ambulation Surface  Indoor;Outdoor;Grass;Paved    Gait Comments  OUTDOOR surfaces: Walking on rubber mulch, pinestraw, grassy surfaces with CGA for safety.  DYNAMIC GAIT INDOORS: including heel/toe walking with CGA, backwards walking x 30 feet with CGA.                    [facility] Adult PT Treatment/Exercise - 01/07/18 1133      Ambulation/Gait   Ambulation/Gait  Yes    Ambulation/Gait Assistance  5: Supervision      Neuro Re-ed    Neuro Re-ed Details   Sit<>stand with eyes closed on solid surface x 10 reps, on compliant surfaces with eyes open/eyes closed x 10 reps with min A for safety.  Quick transitions moving from sit>stand>walking x 10 feet and then turning to return to surface x 5 reps working through dizzy sensation of 3-4/10 with transitional movements.   Sidestepping with ball overhead in order to engage core during lateral movements.  Marching with ball overhead x 20 times.  Standing on foam beam with head motion horiz/vertical, eyes closed with min A.  Alternating cone taps while standing on incline with CGA.      Exercises   Exercises  Other Exercises    Other Exercises   step ups to 6" step dec'ing UE support x 10 reps R and L sides.          Balance Exercises - 01/06/18 0735      Balance Exercises: Standing   Standing Eyes Opened  Narrow base of support (BOS);Foam/compliant surface;30 secs;2 reps;Head turns;Wide (BOA) standing on two pillows    Standing Eyes Closed  Narrow base of support (BOS);Wide (BOA);Head turns;Foam/compliant surface;30 secs;2 reps;Other (comment) standing on two pillows    Tandem Stance  Eyes closed;Eyes open;Intermittent upper extremity support;2 reps;30 secs    SLS  Solid surface;Eyes open;Intermittent upper extremity support;10 secs;5 reps      Balance Exercises: Standing   Standing Eyes Closed Limitations  Pt performed standing balance exercises on two pillows for 2 reps x 30 seconds bilaterally. Began task  with eyes open until pt felt stable with balance. EO tandem stance, EC tandem stance, EO narrow BOS, EC narrow BOS, EC  narrow BOS, EC narrow BOS with head turns up/down and then left/right and then diagonal.  Pt had most difficult with tandem stance and exhibited swaying in various directions. Pt was able to get his COG after using UE hold from the chair back and then progressed to letting go to use no UE support.     SLS Limitations  Pt performed single leg stance with eyes open, holding countertop with two finger holds bilaterally. Progressed to not holding counter at all. Pt held position for 5-10 seconds before using steppage strategy x 5 reps bilateral.           PT Short Term Goals - 12/29/17 2141      PT SHORT TERM GOAL #1   Title  Patient will be independent with HEP addressing balance and LE strength (Target date 01/29/18)    Time  4    Period  Weeks    Status  New    Target Date  01/29/18      PT SHORT TERM GOAL #2   Title  Patient will improve gait velocity to >=2.62 ft/sec to demonstrate increased safety with community level ambulation and lesser fall risk.    Baseline  2.26 ft/sec    Time  4    Period  Weeks    Status  New      PT SHORT TERM GOAL #3   Title  Patient will improve Berg Balance score to >=52/56 to show decreased fall risk.    Time  4    Period  Weeks    Status  New      PT SHORT TERM GOAL #4   Title  Patient will ambulate 1000 ft over paved outdoor terrain with [facility] and modified independent.    Time  4    Period  Weeks    Status  New      PT SHORT TERM GOAL #5   Title  Patient can ascend/descend 12 steps alternating pattern with SPC and supervision (demonstrating improved balance and control on stairs)    Time  4    Period  Weeks    Status  New        PT Long Term Goals - 12/29/17 2148      PT LONG TERM GOAL #1   Title  Patient will be independent with updated HEP and able to verbalize a plan for community-based activity upone discharge from PT.      Time  7    Period  Weeks    Status  New    Target Date  02/19/18      PT LONG TERM GOAL #2   Title  Patient will improve gait velocity to >= 3.0 ft/sec to show improved safety in community ambulation (and approaching normal velocity for male 30-59 yo)    Time  7    Period  Weeks    Status  New      PT LONG TERM GOAL #3   Title  Patient will ambulate 1000 ft over outdoor unlevel terrain (ramps, curbs, grass, gravel) with LRAD and no LOB.     Time  7    Period  Weeks    Status  New      PT LONG TERM GOAL #4   Title  Patient will ascend/descend 12 steps alternating pattern with no UE support.     Time  7    Period  Weeks    Status  New  Plan - 01/07/18 1450    Clinical Impression Statement  Today's session emphasized extended participation in activities for endurance while working on unlevel surface negotiation, balance, and transitional tasks.  Patient notes fatigue with activities today.  PT to continue to emphasize high level balance, community gait, quick transitional movements, and work related tasks.    PT Treatment/Interventions  ADLs/Self Care Home Management;Aquatic Therapy;Electrical Stimulation;DME Instruction;Gait training;Neuromuscular re-education;Balance training;Therapeutic exercise;Therapeutic activities;Functional mobility training;Stair training;Patient/family education;Orthotic Fit/Training;Passive range of motion    PT Next Visit Plan  Check on HEP (had not completed yet), continue to progress dynamic gait/balance, check cane height, and work related tasks    Consulted and Agree with Plan of Care  Patient       Patient will benefit from skilled therapeutic intervention in order to improve the following deficits and impairments:  Abnormal gait, Decreased balance, Decreased mobility, Decreased knowledge of use of DME, Decreased coordination, Decreased range of motion, Decreased strength, Impaired sensation, Postural dysfunction, Impaired UE  functional use, Obesity, Pain  Visit Diagnosis: Unsteadiness on feet  Muscle weakness (generalized)  Other abnormalities of gait and mobility  Abnormal posture     Problem List Patient Active Problem List   Diagnosis Date Noted  . Right sided weakness 12/26/2017  . Stroke ([place]) 12/16/2017  . Difficulty speaking   . BMI 36.0-36.9,adult 08/05/2017  . Cholelithiasis 08/05/2017  . Bilateral plantar fasciitis 08/05/2017  . Situational mixed anxiety and depressive disorder 08/05/2017  . Bilateral primary osteoarthritis of knee 07/01/2017  . Asthma 01/26/2017  . Overactive bladder 01/21/2017  . BPH with obstruction/lower urinary tract symptoms 01/21/2017  . S/P appendectomy 01/07/2017  . Diverticulosis 11/24/2016  . Type 2 diabetes mellitus without complication, without long-term current use of insulin ([place]) 10/31/2016  . Aortic insufficiency 11/23/2012  . Chronic diastolic CHF (congestive heart failure) ([facility]) 11/23/2012  . CAD (coronary artery disease) 11/27/2011  . OSA (obstructive sleep apnea) 11/11/2011  . Aortic root dilatation ([employer])   . Hyperlipidemia 01/23/2011  . Hypertension 01/23/2011    Casson Catena, PT 01/07/2018, 2:59 PM  [facility] [address] [place], [place], [address] Phone: [phone]   Fax:  [phone]  Name: Jonathan Miles MRN: [id number] Date of Birth: 1961/09/16

## 2018-01-08 ENCOUNTER — Other Ambulatory Visit: Payer: Self-pay

## 2018-01-08 MED ORDER — LIDOCAINE 5 % EX OINT: 1.0000 "application " | TOPICAL_OINTMENT | CUTANEOUS | 0 refills | Status: DC | PRN

## 2018-01-12 ENCOUNTER — Ambulatory Visit: Payer: BLUE CROSS/BLUE SHIELD | Admitting: Physical Therapy

## 2018-01-12 ENCOUNTER — Encounter: Payer: Self-pay | Admitting: Physical Therapy

## 2018-01-12 ENCOUNTER — Ambulatory Visit: Payer: BLUE CROSS/BLUE SHIELD | Admitting: Occupational Therapy

## 2018-01-12 ENCOUNTER — Encounter: Payer: Self-pay | Admitting: Occupational Therapy

## 2018-01-12 DIAGNOSIS — M6281 Muscle weakness (generalized): Secondary | ICD-10-CM

## 2018-01-12 DIAGNOSIS — R2681 Unsteadiness on feet: Secondary | ICD-10-CM

## 2018-01-12 DIAGNOSIS — I69351 Hemiplegia and hemiparesis following cerebral infarction affecting right dominant side: Secondary | ICD-10-CM

## 2018-01-12 DIAGNOSIS — R278 Other lack of coordination: Secondary | ICD-10-CM

## 2018-01-12 NOTE — Therapy (Signed)
[facility] [address] [place], [place], [address] Phone: [phone]   Fax:  [phone]  Occupational Therapy Treatment  Patient Details  Name: Jonathan Miles MRN: [id number] Date of Birth: Nov 28, 1960 Referring Provider: Antony Contras   Encounter Date: 01/12/2018  OT End of Session - 01/12/18 1231    Visit Number  4    Number of Visits  15    Date for OT Re-Evaluation  02/24/18    Authorization Type  BC/BS - pt has 30 visits combined between PT and OT (each visit counts)    OT Start Time  1146    OT Stop Time  1227    OT Time Calculation (min)  41 min    Activity Tolerance  --       Past Medical History:  Diagnosis Date  . Alcohol abuse   . Aortic root dilatation (HCC)    a. echo 4/12: EF 55-60%, mod to marked dilated ascending aortic root;   b. MRA 4/12: Aortic root 5.5 cm, dilation of left and right coronary cusps, trileaflet aortic valve // Echo 2/19: Moderate LVH, EF 55-60, normal wall motion, grade 1 diastolic dysfunction, mild AI, mildly dilated aortic root, moderate to severe LAE (aortic root 40 mm; ascending aorta 39 mm)   . Arthritis   . Asthma    ast attack in early 20's  . CAD (coronary artery disease)   . Chest pain    Myoview 4/12: EF 62%, no ischemia or scar  . CHF (congestive heart failure) ([place])   . Chronic pain    back and hands  . Constipation   . COPD (chronic obstructive pulmonary disease) ([place])   . Depression   . Diabetes mellitus    type 2  . Drug use   . Enlarged prostate   . Fatigue   . GERD (gastroesophageal reflux disease)    modifies with diet  . Hx of echocardiogram    Echo (2/16):  Mod LVH, EF 55-60%, mild to mod AI, mod LAE, mild RAE, Aortic root 38 mm, Asc aorta 43 mm  . Hyperlipidemia   . Hypertension   . Joint pain   . Knee pain, bilateral   . Leg pain    ABIs 3/19: Normal (R 1.3; L 1.28)  . OSA (obstructive sleep apnea) 2006   .  Wears at times  .  Osteoarthritis   . Palpitations     Past Surgical History:  Procedure Laterality Date  . APPENDECTOMY  12/2016  . Colonscopy    . CORONARY ARTERY BYPASS GRAFT  05/07/2012   Procedure: CORONARY ARTERY BYPASS GRAFTING (CABG);  Surgeon: Grace Isaac, MD;  Location: [facility];  Service: Open Heart Surgery;  Laterality: N/A;  . KNEE ARTHROSCOPY Left 2010   Dr. Rosamaria Lints  . LAPAROSCOPIC APPENDECTOMY N/A 01/06/2017   Procedure: APPENDECTOMY LAPAROSCOPIC;  Surgeon: Ralene Ok, MD;  Location: [facility];  Service: General;  Laterality: N/A;  . Retactment of fingers     as a child , sewed with sewing machine- Index and middle finger  . UPPER GASTROINTESTINAL ENDOSCOPY      There were no vitals filed for this visit.  Subjective Assessment - 01/12/18 1153    Pertinent History  CVA 12/16/17. PMH: CAD, CHF, COPD, DM, Bypass and aortic repair 2013    Limitations  no strenuous lifting, no driving    Patient Stated Goals  Get my hand stronger and increase coordination    Currently in Pain?  Yes    Pain Score  2     Pain Location  Back    Pain Orientation  Lower    Pain Descriptors / Indicators  Aching    Pain Type  Chronic pain    Pain Onset  More than a month ago    Pain Frequency  Intermittent    Aggravating Factors   walking too long    Pain Relieving Factors  lidocaine cream    Multiple Pain Sites  Yes    Pain Score  8    Pain Location  Knee    Pain Orientation  Right;Left    Pain Descriptors / Indicators  Sore    Pain Type  Chronic pain    Pain Onset  More than a month ago    Pain Frequency  Intermittent    Aggravating Factors   its a little worse since I worked out with PT    Pain Relieving Factors  rest, tylenol                   OT Treatments/Exercises (OP) - 01/12/18 0001      ADLs   ADL Comments  Reassessed goals - pt with signficant improvement in coordination and grip strength. Discussed POC and pt's limited visits. After discussion pt has decided to d/c from OT  after today's session and work with PT only in order to focus on balance and activity tolerance to return to work as well as to potentially save some visits given that it is only March      Hand Exercises   Other Hand Exercises  Addressed manipulation of nuts and bolts with R hand blind - to did well with task. Also addressed manipulating 2 small objects in hand at once using [employer] Pegboard - pt with minimal difficulty.  Performance improved with practice.  Pt also able to manipulate 3 small objects with exta time and minimal difficulty               OT Short Term Goals - 01/12/18 1221      OT SHORT TERM GOAL #1   Title  Independent with HEP (Coordination, putty Rt hand)  - goals due 01/29/2018    Time  4    Period  Weeks    Status  Achieved      OT SHORT TERM GOAL #2   Title  Pt to return to tying shoes, fastening, buttons, zippers on clothes independently    Time  4    Period  Weeks    Status  Achieved      OT SHORT TERM GOAL #3   Title  Pt to improve coordination Rt hand as evidenced by performing 9 hole peg test in 35 sec. or under    Baseline  42.59 sec    Time  4    Period  Weeks    Status  Achieved 01/12/2018  24.05      OT SHORT TERM GOAL #4   Title  Pt to improve grip strength Rt hand to 65 lbs grip strength or greater     Baseline  55 lbs (Lt = 95 lbs)     Time  4    Period  Weeks    Status  Achieved 13/19/2019  102 pounds        OT Long Term Goals - 01/12/18 1222      OT LONG TERM GOAL #1   Title  Independent with updated strengthening HEP for BUE's -  goals due 02/24/2018    Time  7    Period  Weeks    Status  Achieved      OT LONG TERM GOAL #2   Title  Pt to return to cooking and cleaning tasks mod I level     Time  7    Period  Weeks    Status  Achieved      OT LONG TERM GOAL #3   Title  Pt to improve coordination Rt hand as evidenced by performing 9 hole peg test in 28 sec. or less    Baseline  42.59 sec    Time  7    Period  Weeks    Status   Achieved 01/12/2018  24.05      OT LONG TERM GOAL #4   Title  Pt to perform challenging dynamic standing tasks w/o LOB    Time  7    Period  Weeks    Status  Deferred PT to address to save on visit limits            Plan - 01/12/18 1223    Clinical Impression Statement  Pt has met or exceeded all but one LTG - PT to address. Pt to d/c from OT today given progress and visit limits. Pt in agreement.     Occupational Profile and client history currently impacting functional performance  PMH: CAD, CHF, COPD, DM, Bypass sx 2013, chronic arthritis causing pain in back and legs. Current limitations decrease ability to use Rt hand as dominant hand and perform work related tasks    Occupational performance deficits (Please refer to evaluation for details):  ADL's;IADL's;Work;Leisure;Social Participation    Rehab Potential  Good    OT Frequency  2x / week    OT Duration  Other (comment) 7 weeks    OT Treatment/Interventions  Therapeutic exercise;Therapeutic activities;Neuromuscular education;Balance training    Plan  check HEP for grip strngth and coordination, work on grip strength, add to HEP as appropriate, address activity tolerance, balance.     Consulted and Agree with Plan of Care  Patient       Patient will benefit from skilled therapeutic intervention in order to improve the following deficits and impairments:  Decreased coordination, Decreased endurance, Impaired sensation, Decreased activity tolerance, Impaired UE functional use, Decreased mobility, Decreased strength  Visit Diagnosis: Unsteadiness on feet  Hemiplegia and hemiparesis following cerebral infarction affecting right dominant side (HCC)  Muscle weakness (generalized)  Other lack of coordination    Problem List Patient Active Problem List   Diagnosis Date Noted  . Right sided weakness 12/26/2017  . Stroke ([place] [place]) 12/16/2017  . Difficulty speaking   . BMI 36.0-36.9,adult 08/05/2017  . Cholelithiasis 08/05/2017   . Bilateral plantar fasciitis 08/05/2017  . Situational mixed anxiety and depressive disorder 08/05/2017  . Bilateral primary osteoarthritis of knee 07/01/2017  . Asthma 01/26/2017  . Overactive bladder 01/21/2017  . BPH with obstruction/lower urinary tract symptoms 01/21/2017  . S/P appendectomy 01/07/2017  . Diverticulosis 11/24/2016  . Type 2 diabetes mellitus without complication, without long-term current use of insulin ([facility]) 10/31/2016  . Aortic insufficiency 11/23/2012  . Chronic diastolic CHF (congestive heart failure) ([place]) 11/23/2012  . CAD (coronary artery disease) 11/27/2011  . OSA (obstructive sleep apnea) 11/11/2011  . Aortic root dilatation ([place])   . Hyperlipidemia 01/23/2011  . Hypertension 01/23/2011    Quay Burow, OTR/L 01/12/2018, 12:33 PM  [name] [place] [address]  [place], [place], [address] Phone: [phone]   Fax:  [phone]  Name: Mael Delap MRN: [id number] Date of Birth: [birth year]

## 2018-01-12 NOTE — Therapy (Signed)
[facility] [address] [place], [place], [address] Phone: [phone]   Fax:  [phone]  Physical Therapy Treatment  Patient Details  Name: Jonathan Miles MRN: [id number] Date of Birth: [birth year] Referring Provider: Antony Contras   Encounter Date: 01/12/2018  PT End of Session - 01/12/18 1124    Visit Number  4    Number of Visits  15    Date for PT Re-Evaluation  02/19/18    Authorization Type  BC[employer]; PT and OT combined limit 30 visits    Authorization - Visit Number  4    Authorization - Number of Visits  15    PT Start Time  1016    PT Stop Time  1058    PT Time Calculation (min)  42 min    Equipment Utilized During Treatment  Gait belt    Activity Tolerance  Patient tolerated treatment well    Behavior During Therapy  WFL for tasks assessed/performed       Past Medical History:  Diagnosis Date  . Alcohol abuse   . Aortic root dilatation (HCC)    a. echo 4/12: EF 55-60%, mod to marked dilated ascending aortic root;   b. MRA 4/12: Aortic root 5.5 cm, dilation of left and right coronary cusps, trileaflet aortic valve // Echo 2/19: Moderate LVH, EF 55-60, normal wall motion, grade 1 diastolic dysfunction, mild AI, mildly dilated aortic root, moderate to severe LAE (aortic root 40 mm; ascending aorta 39 mm)   . Arthritis   . Asthma    ast attack in early 20's  . CAD (coronary artery disease)   . Chest pain    Myoview 4/12: EF 62%, no ischemia or scar  . CHF (congestive heart failure) ([place])   . Chronic pain    back and hands  . Constipation   . COPD (chronic obstructive pulmonary disease) ([place])   . Depression   . Diabetes mellitus    type 2  . Drug use   . Enlarged prostate   . Fatigue   . GERD (gastroesophageal reflux disease)    modifies with diet  . Hx of echocardiogram    Echo (2/16):  Mod LVH, EF 55-60%, mild to mod AI, mod LAE, mild RAE, Aortic root 38 mm, Asc aorta 43 mm  . Hyperlipidemia    . Hypertension   . Joint pain   . Knee pain, bilateral   . Leg pain    ABIs 3/19: Normal (R 1.3; L 1.28)  . OSA (obstructive sleep apnea) 2006   .  Wears at times  . Osteoarthritis   . Palpitations     Past Surgical History:  Procedure Laterality Date  . APPENDECTOMY  12/2016  . Colonscopy    . CORONARY ARTERY BYPASS GRAFT  05/07/2012   Procedure: CORONARY ARTERY BYPASS GRAFTING (CABG);  Surgeon: Grace Isaac, MD;  Location: [facility];  Service: Open Heart Surgery;  Laterality: N/A;  . KNEE ARTHROSCOPY Left 2010   Dr. Rosamaria Lints  . LAPAROSCOPIC APPENDECTOMY N/A 01/06/2017   Procedure: APPENDECTOMY LAPAROSCOPIC;  Surgeon: Ralene Ok, MD;  Location: [facility];  Service: General;  Laterality: N/A;  . Retactment of fingers     as a child , sewed with sewing machine- Index and middle finger  . UPPER GASTROINTESTINAL ENDOSCOPY      There were no vitals filed for this visit.  Subjective Assessment - 01/12/18 1016    Subjective  Pt reported no changes in medications and  no falls. However, Did report pain in low back, both knees, and head. More specifically stated the back of head/neck pt felt a nerve pain in the center on Sunday night which almost has subsided. But that almost required him to go to the ED or call his doctor. Pt was educated on TIA's by PTA and advised if he feels the same symptoms again to follow up with his doctor/ PCP.     Patient is accompained by:  Family member    Pertinent History  CAD, CHF, HTN, COPD, DM, bil plant fasc, bil knee OA,     Patient Stated Goals  I've got to get my balance, strength, coordination back together; be able to return to work    Currently in Pain?  Yes    Pain Score  2     Pain Location  Back    Pain Orientation  Lower    Pain Descriptors / Indicators  Aching    Pain Type  Chronic pain    Pain Onset  More than a month ago    Pain Frequency  Intermittent    Aggravating Factors   on his feet    Pain Relieving Factors  lidocaine cream     Multiple Pain Sites  Yes    Pain Score  5    Pain Location  Knee    Pain Orientation  Right;Left;Anterior    Pain Descriptors / Indicators  Sore    Pain Type  Chronic pain    Pain Onset  More than a month ago    Pain Frequency  Intermittent    Aggravating Factors   squatting to deep    Pain Relieving Factors  rest    Pain Location  Neck    Pain Orientation  Posterior    Pain Descriptors / Indicators  Tingling;Sharp;Radiating;Other (Comment) Nerve pain    Pain Type  Acute pain    Pain Radiating Towards  midline/back of neck and head    Pain Onset  In the past 7 days    Pain Frequency  Rarely    Aggravating Factors   unknown    Effect of Pain on Daily Activities  Past sunday was not able to attend church due to head/ back of neck pain          [facility] Adult PT Treatment/Exercise - 01/12/18 1225      Transfers   Transfers  Sit to Stand;Stand to Sit    Sit to Stand  6: Modified independent (Device/Increase time);From chair/3-in-1;From bed    Stand to Sit  6: Modified independent (Device/Increase time);With upper extremity assist    Comments  x 6 reps during brief rest breaks.      Ambulation/Gait   Ambulation/Gait  Yes    Ambulation/Gait Assistance  5: Supervision    Ambulation/Gait Assistance Details  Without AD and gait with wide base of support. Pt performed gait with verbal cognitive dual task. Pt gait speed minimal decreased and no loss of balance noted during gait trial.     Ambulation Distance (Feet)  700 Feet    Assistive device  None    Ambulation Surface  Indoor;Level      High Level Balance   High Level Balance Activities  Marching forwards;Marching backwards;Tandem walking;Side stepping;Backward walking    High Level Balance Comments  Pt performed all activities while walking on red and blue mats x 3 reps each along the countertop with no UE support. Walking forward and backwards with bilateral overhead hold  of #2 weighted ball, marching forward and backwards with  bilateral OH holding #2 weighted ball, side stepping left and right with overhead hold of #2 weighted ball. Added monster walks (side step with a mini squat, plus core brace) with holding #2 ball towards around sternum level close to body, x 3 reps left and right. Performed for B LE strengthening and balance training. Followed by tall kneeling x 3 reps each : overhead chops with #2 ball x 10 reps. Added half kneeling diagonal PNF D1 D2 pattern chops with #2 ball x 10 reps bilaterally. Pt was able to maintain proper posture during tall and half kneeling exercises, only required initial demonstration cues for performance of new tasks.          Balance Exercises - 01/12/18 1247      Balance Exercises: Standing   Standing Eyes Opened  Narrow base of support (BOS);Foam/compliant surface;30 secs;2 reps;Head turns;Wide (BOA)    Standing Eyes Closed  Narrow base of support (BOS);Wide (BOA);Head turns;Foam/compliant surface;30 secs;2 reps;Other (comment)    Tandem Stance  Eyes closed;Eyes open;Intermittent upper extremity support;2 reps;30 secs    SLS  Solid surface;Eyes open;Intermittent upper extremity support;10 secs;5 reps      Balance Exercises: Standing   Standing Eyes Closed Limitations  Pt performed standing balance exercises on airex pad for 2 reps x 30 seconds bilaterally. EC tandem stance, EC tandem stance with head turns up/down and then left/right, EC narrow BOS, EC narrow BOS, EC narrow BOS with head turns up/down and then left/right and then diagonal.  With tandem stance and exhibited swaying posteriorly which required min assist to regain upright balance x 1. Pt was able to get his COG after using UE hold from the chair back and then progressed to letting go to use no UE support for the remainder of balance drills using ankle strategy only to correct his balance.      SLS Limitations  Pt performed single leg stance with eyes open, holding countertop with two finger holds bilaterally and  progressed to not holding counter at all. Pt held position for 5-10 seconds before using steppage strategy x 5 reps bilateral. Added single leg stance while on red mat x 5 reps bilaterally for 5 sec holds with two finger holds on countertop. SPTA provided minimal assistance via gait belt.         PT Short Term Goals - 12/29/17 2141      PT SHORT TERM GOAL #1   Title  Patient will be independent with HEP addressing balance and LE strength (Target date 01/29/18)    Time  4    Period  Weeks    Status  New    Target Date  01/29/18      PT SHORT TERM GOAL #2   Title  Patient will improve gait velocity to >=2.62 ft/sec to demonstrate increased safety with community level ambulation and lesser fall risk.    Baseline  2.26 ft/sec    Time  4    Period  Weeks    Status  New      PT SHORT TERM GOAL #3   Title  Patient will improve Berg Balance score to >=52/56 to show decreased fall risk.    Time  4    Period  Weeks    Status  New      PT SHORT TERM GOAL #4   Title  Patient will ambulate 1000 ft over paved outdoor terrain with [facility] and modified independent.  Time  4    Period  Weeks    Status  New      PT SHORT TERM GOAL #5   Title  Patient can ascend/descend 12 steps alternating pattern with SPC and supervision (demonstrating improved balance and control on stairs)    Time  4    Period  Weeks    Status  New        PT Long Term Goals - 12/29/17 2148      PT LONG TERM GOAL #1   Title  Patient will be independent with updated HEP and able to verbalize a plan for community-based activity upone discharge from PT.     Time  7    Period  Weeks    Status  New    Target Date  02/19/18      PT LONG TERM GOAL #2   Title  Patient will improve gait velocity to >= 3.0 ft/sec to show improved safety in community ambulation (and approaching normal velocity for male 35-59 yo)    Time  7    Period  Weeks    Status  New      PT LONG TERM GOAL #3   Title  Patient will ambulate 1000 ft over  outdoor unlevel terrain (ramps, curbs, grass, gravel) with LRAD and no LOB.     Time  7    Period  Weeks    Status  New      PT LONG TERM GOAL #4   Title  Patient will ascend/descend 12 steps alternating pattern with no UE support.     Time  7    Period  Weeks    Status  New         Plan - 01/12/18 1125    Clinical Impression Statement  Today's skilled session focused on increasing gait distance with a verbal cognitive dual task, working on static balance on drills standing on uneven surfaces, and progressing dynamic balance and bilateral LE drills during functional mobility on unlevel surfaces. Pt did present to PT clinic today with increased pain in various areas of body, which remained at baseline during entire session. Pt was able to tolerate increased activity levels and progressions well today. Pt continues to make progression towards STG's and would benefit from continued PT interventions towards reaching unmet PT goals.     History and Personal Factors relevant to plan of care:  PMH-CAD, CHF, HTN, COPD, DM, bil plant fasc, bil knee OA, Personal factors- (positive) prior level of funcction, highly motivated, wife very supportive/involved    Clinical Presentation  Evolving    Clinical Presentation due to:  <1 month since onset acute CVA    Clinical Decision Making  Moderate    Rehab Potential  Good    Clinical Impairments Affecting Rehab Potential  bil knee pain due to arthritis (left worst); peripheral neuropathy    PT Frequency  2x / week    PT Duration  Other (comment)    PT Treatment/Interventions  ADLs/Self Care Home Management;Aquatic Therapy;Electrical Stimulation;DME Instruction;Gait training;Neuromuscular re-education;Balance training;Therapeutic exercise;Therapeutic activities;Functional mobility training;Stair training;Patient/family education;Orthotic Fit/Training;Passive range of motion    PT Next Visit Plan  Continue to progress dynamic gait/balance, tall kneeling and  half kneeling work related tasks; [employer]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 Care  Patient       Patient will benefit from skilled therapeutic intervention in order to improve the following deficits and impairments:  Abnormal gait, Decreased balance,  Decreased mobility, Decreased knowledge of use of DME, Decreased coordination, Decreased range of motion, Decreased strength, Impaired sensation, Postural dysfunction, Impaired UE functional use, Obesity, Pain  Visit Diagnosis: Unsteadiness on feet  Hemiplegia and hemiparesis following cerebral infarction affecting right dominant side (HCC)  Muscle weakness (generalized)     Problem List Patient Active Problem List   Diagnosis Date Noted  . Right sided weakness 12/26/2017  . Stroke ([facility]) 12/16/2017  . Difficulty speaking   . BMI 36.0-36.9,adult 08/05/2017  . Cholelithiasis 08/05/2017  . Bilateral plantar fasciitis 08/05/2017  . Situational mixed anxiety and depressive disorder 08/05/2017  . Bilateral primary osteoarthritis of knee 07/01/2017  . Asthma 01/26/2017  . Overactive bladder 01/21/2017  . BPH with obstruction/lower urinary tract symptoms 01/21/2017  . S/P appendectomy 01/07/2017  . Diverticulosis 11/24/2016  . Type 2 diabetes mellitus without complication, without long-term current use of insulin ([facility]) 10/31/2016  . Aortic insufficiency 11/23/2012  . Chronic diastolic CHF (congestive heart failure) ([place]) 11/23/2012  . CAD (coronary artery disease) 11/27/2011  . OSA (obstructive sleep apnea) 11/11/2011  . Aortic root dilatation ([place])   . Hyperlipidemia 01/23/2011  . Hypertension 01/23/2011    Carlena Sax, SPTA 01/12/2018, 12:54 PM  [facility] [address] [place], [place], [address] Phone: [phone]   Fax:  [phone]  Name: Jonathan Miles MRN: [id number] Date of Birth: 1961/06/15

## 2018-01-14 ENCOUNTER — Ambulatory Visit: Payer: BLUE CROSS/BLUE SHIELD | Admitting: Physical Therapy

## 2018-01-14 ENCOUNTER — Encounter: Payer: BLUE CROSS/BLUE SHIELD | Admitting: Occupational Therapy

## 2018-01-14 ENCOUNTER — Encounter: Payer: Self-pay | Admitting: Physical Therapy

## 2018-01-14 DIAGNOSIS — R2681 Unsteadiness on feet: Secondary | ICD-10-CM | POA: Diagnosis not present

## 2018-01-14 DIAGNOSIS — M6281 Muscle weakness (generalized): Secondary | ICD-10-CM

## 2018-01-14 NOTE — Therapy (Signed)
[facility] [address] [place], [place], [address] Phone: [phone]   Fax:  [phone]  Physical Therapy Treatment  Patient Details  Name: Jonathan Miles MRN: [id number] Date of Birth: 1961-04-15 Referring Provider: Antony Contras   Encounter Date: 01/14/2018  PT End of Session - 01/14/18 1735    Visit Number  5    Number of Visits  15    Date for PT Re-Evaluation  02/19/18    Authorization Type  BCBS; PT and OT combined limit 30 visits    Authorization - Visit Number  9 includes 4 OT visits    Authorization - Number of Visits  30 PT/OT combined 30 VL    PT Start Time  7793    PT Stop Time  1358    PT Time Calculation (min)  43 min    Equipment Utilized During Treatment  --    Activity Tolerance  Patient tolerated treatment well    Behavior During Therapy  [facility] for tasks assessed/performed       Past Medical History:  Diagnosis Date  . Alcohol abuse   . Aortic root dilatation (HCC)    a. echo 4/12: EF 55-60%, mod to marked dilated ascending aortic root;   b. MRA 4/12: Aortic root 5.5 cm, dilation of left and right coronary cusps, trileaflet aortic valve // Echo 2/19: Moderate LVH, EF 55-60, normal wall motion, grade 1 diastolic dysfunction, mild AI, mildly dilated aortic root, moderate to severe LAE (aortic root 40 mm; ascending aorta 39 mm)   . Arthritis   . Asthma    ast attack in early 20's  . CAD (coronary artery disease)   . Chest pain    Myoview 4/12: EF 62%, no ischemia or scar  . CHF (congestive heart failure) ([facility])   . Chronic pain    back and hands  . Constipation   . COPD (chronic obstructive pulmonary disease) ([place])   . Depression   . Diabetes mellitus    type 2  . Drug use   . Enlarged prostate   . Fatigue   . GERD (gastroesophageal reflux disease)    modifies with diet  . Hx of echocardiogram    Echo (2/16):  Mod LVH, EF 55-60%, mild to mod AI, mod LAE, mild RAE, Aortic root 38 mm,  Asc aorta 43 mm  . Hyperlipidemia   . Hypertension   . Joint pain   . Knee pain, bilateral   . Leg pain    ABIs 3/19: Normal (R 1.3; L 1.28)  . OSA (obstructive sleep apnea) 2006   .  Wears at times  . Osteoarthritis   . Palpitations     Past Surgical History:  Procedure Laterality Date  . APPENDECTOMY  12/2016  . Colonscopy    . CORONARY ARTERY BYPASS GRAFT  05/07/2012   Procedure: CORONARY ARTERY BYPASS GRAFTING (CABG);  Surgeon: Grace Isaac, MD;  Location: [facility];  Service: Open Heart Surgery;  Laterality: N/A;  . KNEE ARTHROSCOPY Left 2010   Dr. Rosamaria Lints  . LAPAROSCOPIC APPENDECTOMY N/A 01/06/2017   Procedure: APPENDECTOMY LAPAROSCOPIC;  Surgeon: Ralene Ok, MD;  Location: [facility];  Service: General;  Laterality: N/A;  . Retactment of fingers     as a child , sewed with sewing machine- Index and middle finger  . UPPER GASTROINTESTINAL ENDOSCOPY      There were no vitals filed for this visit.  Subjective Assessment - 01/14/18 1314    Subjective  Leg strength is definitely a major concern. It's mostly my thighs (points to quads bil). Thinks it's partly due to not working and less active. Sometimes his left knee feels like it's going to buckle--it has not, but feels like it could.     Patient is accompained by:  Family member    Pertinent History  CAD, CHF, HTN, COPD, DM, bil plant fasc, bil knee OA,     Patient Stated Goals  I've got to get my balance, strength, coordination back together; be able to return to work    Currently in Pain?  Yes    Pain Score  4     Pain Location  Back    Pain Orientation  Lower    Pain Descriptors / Indicators  Aching    Pain Type  Chronic pain    Pain Onset  More than a month ago    Pain Onset  More than a month ago    Pain Onset  In the past 7 days                      [facility] Adult PT Treatment/Exercise - 01/14/18 1729      Exercises   Exercises  Knee/Hip      Knee/Hip Exercises: Machines for Strengthening    Cybex Leg Press  70# bil LEs 10 reps x 3 sets      Knee/Hip Exercises: Standing   Terminal Knee Extension  Strengthening;Right;1 set;20 reps;Theraband    Theraband Level (Terminal Knee Extension)  Level 4 (Blue)    Terminal Knee Extension Limitations  Did both facing the anchor (band across posterior thigh) and facing away from anchor (band across distal quads)    Other Standing Knee Exercises  sit to stand with rt foot underneath and left foot slightly forward, no UEs, slow controlled x 10      Added walking program to HEP (see handout) Discussed patient's stair climbing and warned against incr knee pain from this activity and to do in moderation/monitor his knee pain.        PT Education - 01/14/18 1734    Education Details  LE strengthening ex's added to HEP    Person(s) Educated  Patient    Methods  Explanation;Demonstration;Handout    Comprehension  Verbalized understanding;Returned demonstration       PT Short Term Goals - 12/29/17 2141      PT SHORT TERM GOAL #1   Title  Patient will be independent with HEP addressing balance and LE strength (Target date 01/29/18)    Time  4    Period  Weeks    Status  New    Target Date  01/29/18      PT SHORT TERM GOAL #2   Title  Patient will improve gait velocity to >=2.62 ft/sec to demonstrate increased safety with community level ambulation and lesser fall risk.    Baseline  2.26 ft/sec    Time  4    Period  Weeks    Status  New      PT SHORT TERM GOAL #3   Title  Patient will improve Berg Balance score to >=52/56 to show decreased fall risk.    Time  4    Period  Weeks    Status  New      PT SHORT TERM GOAL #4   Title  Patient will ambulate 1000 ft over paved outdoor terrain with [facility] and modified independent.    Time  4    Period  Weeks    Status  New      PT SHORT TERM GOAL #5   Title  Patient can ascend/descend 12 steps alternating pattern with SPC and supervision (demonstrating improved balance and control on  stairs)    Time  4    Period  Weeks    Status  New        PT Long Term Goals - 12/29/17 2148      PT LONG TERM GOAL #1   Title  Patient will be independent with updated HEP and able to verbalize a plan for community-based activity upone discharge from PT.     Time  7    Period  Weeks    Status  New    Target Date  02/19/18      PT LONG TERM GOAL #2   Title  Patient will improve gait velocity to >= 3.0 ft/sec to show improved safety in community ambulation (and approaching normal velocity for male 21-59 yo)    Time  7    Period  Weeks    Status  New      PT LONG TERM GOAL #3   Title  Patient will ambulate 1000 ft over outdoor unlevel terrain (ramps, curbs, grass, gravel) with LRAD and no LOB.     Time  7    Period  Weeks    Status  New      PT LONG TERM GOAL #4   Title  Patient will ascend/descend 12 steps alternating pattern with no UE support.     Time  7    Period  Weeks    Status  New            Plan - 01/14/18 1736    Clinical Impression Statement  Discussed patient's annual visit limit for therapies (30 for PT and OT combined) and how many visits he wants to "hold back" in case he has injury/illness later in the year that requires therapy. He wants to complete at least another 2 weeks of PT focusing on strengthening (esp with equipment we have available that he does not have access to). Today's session focused on adding strengthening LE ex's to his HEP (pt in agreement with today's plan). Patient very motivated and anticpate continued progress towards goals.     Rehab Potential  Good    Clinical Impairments Affecting Rehab Potential  bil knee pain due to arthritis (left worst); peripheral neuropathy    PT Frequency  2x / week    PT Duration  Other (comment)    PT Treatment/Interventions  ADLs/Self Care Home Management;Aquatic Therapy;Electrical Stimulation;DME Instruction;Gait training;Neuromuscular re-education;Balance training;Therapeutic exercise;Therapeutic  activities;Functional mobility training;Stair training;Patient/family education;Orthotic Fit/Training;Passive range of motion    PT Next Visit Plan  Plan to d/c early from PT to save some therapy visits for later in the year. Focus on strengthening LEs (per pt, feels his balance is improving). ?d/c 4/9-ish    Consulted and Agree with Plan of Care  Patient       Patient will benefit from skilled therapeutic intervention in order to improve the following deficits and impairments:  Abnormal gait, Decreased balance, Decreased mobility, Decreased knowledge of use of DME, Decreased coordination, Decreased range of motion, Decreased strength, Impaired sensation, Postural dysfunction, Impaired UE functional use, Obesity, Pain  Visit Diagnosis: Muscle weakness (generalized)     Problem List Patient Active Problem List   Diagnosis Date Noted  . Right sided weakness 12/26/2017  .  Stroke ([place]) 12/16/2017  . Difficulty speaking   . BMI 36.0-36.9,adult 08/05/2017  . Cholelithiasis 08/05/2017  . Bilateral plantar fasciitis 08/05/2017  . Situational mixed anxiety and depressive disorder 08/05/2017  . Bilateral primary osteoarthritis of knee 07/01/2017  . Asthma 01/26/2017  . Overactive bladder 01/21/2017  . BPH with obstruction/lower urinary tract symptoms 01/21/2017  . S/P appendectomy 01/07/2017  . Diverticulosis 11/24/2016  . Type 2 diabetes mellitus without complication, without long-term current use of insulin ([place]) 10/31/2016  . Aortic insufficiency 11/23/2012  . Chronic diastolic CHF (congestive heart failure) ([place]) 11/23/2012  . CAD (coronary artery disease) 11/27/2011  . OSA (obstructive sleep apnea) 11/11/2011  . Aortic root dilatation ([place])   . Hyperlipidemia 01/23/2011  . Hypertension 01/23/2011    Rexanne Mano, PT 01/14/2018, 5:42 PM  [facility] [address] [place], [place], [address] Phone: [phone]   Fax:   [phone]  Name: Tredarius Cobern MRN: [id number] Date of Birth: September 19, 1961

## 2018-01-14 NOTE — Patient Instructions (Signed)
Continue stair climbing and wall slides.   Add walking each day to your routine. Either in a big store with a cart or at home.   SIT TO STAND: Feet in Modified Tandem    Sit on the front edge of the seat. Place left foot in front of the right foot. Lean chest forward. NOT pushing up with your arms, raise hips and straighten knees to stand. Squeeze your quads and buttocks while standing. Return to sitting, light as a feather.  __10_ reps per set, ___ sets per day, ___ days per week Hold onto a support. Repeat with other leg.  Copyright  VHI. All rights reserved.     Knee Extension: Terminal - Standing (Single Leg)    Face anchor in shoulder width stance, band around knee. Allow tension of band to slightly bend knee. Pull leg back, straightening knee hold 3 seconds.  Repeat _20-30_ times per set. Repeat with other leg. Do _2_ sets per session. Do _5_ sessions per week.  Turn 180 degrees so band is on the front of your thigh (above your knee). Repeat 20-30 times holding for 3 seconds each.  Anchor Height: Knee  [email or web address].[email or web address]   Copyright  VHI. All rights reserved.

## 2018-01-16 ENCOUNTER — Other Ambulatory Visit: Payer: Self-pay | Admitting: Physician Assistant

## 2018-01-16 DIAGNOSIS — E785 Hyperlipidemia, unspecified: Secondary | ICD-10-CM

## 2018-01-16 DIAGNOSIS — E119 Type 2 diabetes mellitus without complications: Secondary | ICD-10-CM

## 2018-01-18 NOTE — Telephone Encounter (Signed)
Meftormin refill Last OV: 12/26/17 Last Refill:Historical Provider Pharmacy:[facility] [address] PCP: Daphane Shepherd PA   Atorvastatin refill Last OV: From hospital DC (stop taking at discharge) Last Refill:12/18/17 [facility]acy:[facility] PCP: Harrison Mons PA

## 2018-01-19 ENCOUNTER — Encounter: Payer: BLUE CROSS/BLUE SHIELD | Admitting: Occupational Therapy

## 2018-01-19 ENCOUNTER — Ambulatory Visit: Payer: BLUE CROSS/BLUE SHIELD | Admitting: Physical Therapy

## 2018-01-19 ENCOUNTER — Encounter: Payer: Self-pay | Admitting: Physical Therapy

## 2018-01-19 DIAGNOSIS — R2681 Unsteadiness on feet: Secondary | ICD-10-CM | POA: Diagnosis not present

## 2018-01-19 DIAGNOSIS — M6281 Muscle weakness (generalized): Secondary | ICD-10-CM

## 2018-01-19 NOTE — Therapy (Signed)
[facility] [address] [place], [place], [address] Phone: [phone]   Fax:  [phone]  Physical Therapy Treatment  Patient Details  Name: Jonathan Miles MRN: [id number] Date of Birth: Feb 02, 1961 Referring Provider: Antony Contras   Encounter Date: 01/19/2018  PT End of Session - 01/19/18 0832    Visit Number  6    Number of Visits  15    Date for PT Re-Evaluation  02/19/18    Authorization Type  BCBS; PT and OT combined limit 30 visits    Authorization - Visit Number  10 includes 4 OT visits    Authorization - Number of Visits  30 PT/OT combined 30 VL    PT Start Time  0800    PT Stop Time  0845    PT Time Calculation (min)  45 min    Activity Tolerance  Patient tolerated treatment well    Behavior During Therapy  [facility] for tasks assessed/performed       Past Medical History:  Diagnosis Date  . Alcohol abuse   . Aortic root dilatation (HCC)    a. echo 4/12: EF 55-60%, mod to marked dilated ascending aortic root;   b. MRA 4/12: Aortic root 5.5 cm, dilation of left and right coronary cusps, trileaflet aortic valve // Echo 2/19: Moderate LVH, EF 55-60, normal wall motion, grade 1 diastolic dysfunction, mild AI, mildly dilated aortic root, moderate to severe LAE (aortic root 40 mm; ascending aorta 39 mm)   . Arthritis   . Asthma    ast attack in early 20's  . CAD (coronary artery disease)   . Chest pain    Myoview 4/12: EF 62%, no ischemia or scar  . CHF (congestive heart failure) ([place])   . Chronic pain    back and hands  . Constipation   . COPD (chronic obstructive pulmonary disease) ([place])   . Depression   . Diabetes mellitus    type 2  . Drug use   . Enlarged prostate   . Fatigue   . GERD (gastroesophageal reflux disease)    modifies with diet  . Hx of echocardiogram    Echo (2/16):  Mod LVH, EF 55-60%, mild to mod AI, mod LAE, mild RAE, Aortic root 38 mm, Asc aorta 43 mm  . Hyperlipidemia   .  Hypertension   . Joint pain   . Knee pain, bilateral   . Leg pain    ABIs 3/19: Normal (R 1.3; L 1.28)  . OSA (obstructive sleep apnea) 2006   .  Wears at times  . Osteoarthritis   . Palpitations     Past Surgical History:  Procedure Laterality Date  . APPENDECTOMY  12/2016  . Colonscopy    . CORONARY ARTERY BYPASS GRAFT  05/07/2012   Procedure: CORONARY ARTERY BYPASS GRAFTING (CABG);  Surgeon: Grace Isaac, MD;  Location: [facility];  Service: Open Heart Surgery;  Laterality: N/A;  . KNEE ARTHROSCOPY Left 2010   Dr. Rosamaria Lints  . LAPAROSCOPIC APPENDECTOMY N/A 01/06/2017   Procedure: APPENDECTOMY LAPAROSCOPIC;  Surgeon: Ralene Ok, MD;  Location: [facility];  Service: General;  Laterality: N/A;  . Retactment of fingers     as a child , sewed with sewing machine- Index and middle finger  . UPPER GASTROINTESTINAL ENDOSCOPY      There were no vitals filed for this visit.  Subjective Assessment - 01/19/18 0758    Subjective  Made spaghetti this weekend and it went well (OT  had given him this "homework.") Asking about who decides when he is ready to return to work as his short-term disability insurance is asking for MD note re: expected date for return to work. He does not feel he can go back full-time yet (he cannot go back at a reduced schedule and then increase his hours).     Patient is accompained by:  Family member    Pertinent History  CAD, CHF, HTN, COPD, DM, bil plant fasc, bil knee OA,     Patient Stated Goals  I've got to get my balance, strength, coordination back together; be able to return to work    Currently in Pain?  Yes    Pain Score  2     Pain Location  Knee    Pain Orientation  Right    Pain Descriptors / Indicators  Aching    Pain Type  Chronic pain    Pain Onset  More than a month ago    Pain Frequency  Intermittent    Pain Onset  More than a month ago    Pain Onset  In the past 7 days                       [facility] Adult PT Treatment/Exercise -  01/19/18 0810      Transfers   Transfers  Floor to Transfer    Floor to Transfer  6: Modified independent (Device/Increase time);Without upper extremity assist was able to get down to supine and up again w/out use of bed      Ambulation/Gait   Pre-Gait Activities  practiced his bowling approach using 4 pound ball x 7 reps      Exercises   Exercises  Lumbar      Lumbar Exercises: Machines for Strengthening   Elliptical  2 min fwd; 1 min backwd      Lumbar Exercises: Quadruped   Opposite Arm/Leg Raise  Right arm/Left leg;Left arm/Right leg;10 reps    Other Quadruped Lumbar Exercises  single leg raise x 5      Knee/Hip Exercises: Standing   Forward Step Up  Right;Left;1 set;10 reps;Step Height: 8"    Other Standing Knee Exercises  tall kneeling to 1/2 kneeling; sidestepping in tall kneeling x 10 lengths red mat    Other Standing Knee Exercises  monster walk 4/ lb ball x 15 each way          Balance Exercises - 01/19/18 1643      Balance Exercises: Standing   Standing Eyes Closed  Narrow base of support (BOS);Foam/compliant surface with head turns; slight imbalance    Tandem Stance  Eyes open;Eyes closed;Foam/compliant surface;Intermittent upper extremity support head turns     Single leg stance on solid floor with raised foot lightly resting on yoga block, bouncing a ball off the wall 4 ft in front of him. Multiple LOB and had to retry to get to 10 consecutive catches.   PT Education - 01/19/18 1642    Education Details  need to contact Dr. Leonie Man for what to do with insurance company's request re: date for return to work    [employer]) Educated  Patient    Methods  Explanation    Comprehension  Verbalized understanding       PT Short Term Goals - 12/29/17 2141      PT SHORT TERM GOAL #1   Title  Patient will be independent with HEP addressing balance and LE strength (Target date  01/29/18)    Time  4    Period  Weeks    Status  New    Target Date  01/29/18      PT SHORT  TERM GOAL #2   Title  Patient will improve gait velocity to >=2.62 ft/sec to demonstrate increased safety with community level ambulation and lesser fall risk.    Baseline  2.26 ft/sec    Time  4    Period  Weeks    Status  New      PT SHORT TERM GOAL #3   Title  Patient will improve Berg Balance score to >=52/56 to show decreased fall risk.    Time  4    Period  Weeks    Status  New      PT SHORT TERM GOAL #4   Title  Patient will ambulate 1000 ft over paved outdoor terrain with [facility] and modified independent.    Time  4    Period  Weeks    Status  New      PT SHORT TERM GOAL #5   Title  Patient can ascend/descend 12 steps alternating pattern with SPC and supervision (demonstrating improved balance and control on stairs)    Time  4    Period  Weeks    Status  New        PT Long Term Goals - 12/29/17 2148      PT LONG TERM GOAL #1   Title  Patient will be independent with updated HEP and able to verbalize a plan for community-based activity upone discharge from PT.     Time  7    Period  Weeks    Status  New    Target Date  02/19/18      PT LONG TERM GOAL #2   Title  Patient will improve gait velocity to >= 3.0 ft/sec to show improved safety in community ambulation (and approaching normal velocity for male 34-59 yo)    Time  7    Period  Weeks    Status  New      PT LONG TERM GOAL #3   Title  Patient will ambulate 1000 ft over outdoor unlevel terrain (ramps, curbs, grass, gravel) with LRAD and no LOB.     Time  7    Period  Weeks    Status  New      PT LONG TERM GOAL #4   Title  Patient will ascend/descend 12 steps alternating pattern with no UE support.     Time  7    Period  Weeks    Status  New            Plan - 01/19/18 1645    Clinical Impression Statement  Session included strength and balance training. Patient very motivated and works hard throughout session. Deferred insurance questions re: timeframe for return to work to Dr. Leonie Man. Patient can  continue to benefit from PT to maximize his balance and strength before returning to a physically demanding job. Plan to work on lifting 20-25# from floor to counter next visit.     Rehab Potential  Good    Clinical Impairments Affecting Rehab Potential  bil knee pain due to arthritis (left worst); peripheral neuropathy    PT Frequency  2x / week    PT Duration  Other (comment)    PT Treatment/Interventions  ADLs/Self Care Home Management;Aquatic Therapy;Electrical Stimulation;DME Instruction;Gait training;Neuromuscular re-education;Balance training;Therapeutic exercise;Therapeutic activities;Functional mobility training;Stair training;Patient/family education;Orthotic Fit/Training;Passive  range of motion    PT Next Visit Plan  Plan to d/c early from PT to save some therapy visits for later in the year. work on return to work (including lifting up to 50-100 pounds); Focus on strengthening LEs (per pt, feels his balance is improving). ?d/c 4/9-ish    Consulted and Agree with Plan of Care  Patient       Patient will benefit from skilled therapeutic intervention in order to improve the following deficits and impairments:  Abnormal gait, Decreased balance, Decreased mobility, Decreased knowledge of use of DME, Decreased coordination, Decreased range of motion, Decreased strength, Impaired sensation, Postural dysfunction, Impaired UE functional use, Obesity, Pain  Visit Diagnosis: Muscle weakness (generalized)  Unsteadiness on feet     Problem List Patient Active Problem List   Diagnosis Date Noted  . Right sided weakness 12/26/2017  . Stroke ([place]) 12/16/2017  . Difficulty speaking   . BMI 36.0-36.9,adult 08/05/2017  . Cholelithiasis 08/05/2017  . Bilateral plantar fasciitis 08/05/2017  . Situational mixed anxiety and depressive disorder 08/05/2017  . Bilateral primary osteoarthritis of knee 07/01/2017  . Asthma 01/26/2017  . Overactive bladder 01/21/2017  . BPH with obstruction/lower  urinary tract symptoms 01/21/2017  . S/P appendectomy 01/07/2017  . Diverticulosis 11/24/2016  . Type 2 diabetes mellitus without complication, without long-term current use of insulin ([place]) 10/31/2016  . Aortic insufficiency 11/23/2012  . Chronic diastolic CHF (congestive heart failure) ([place]) 11/23/2012  . CAD (coronary artery disease) 11/27/2011  . OSA (obstructive sleep apnea) 11/11/2011  . Aortic root dilatation ([place])   . Hyperlipidemia 01/23/2011  . Hypertension 01/23/2011    Rexanne Mano, PT 01/19/2018, 4:49 PM  [facility] [address] [place], [place], [address] Phone: [phone]   Fax:  [phone]  Name: Jonathan Miles MRN: [id number] Date of Birth: Oct 25, 1961

## 2018-01-19 NOTE — Patient Instructions (Signed)
Feet Partial Heel-Toe (Compliant Surface) Head Motion - Eyes Closed    Stand on compliant surface: _____pillow/folded blanket___ with right foot partially in front of the other. Close eyes and move head slowly, up and down x 10; left and right x 10 Repeat with left foot partially in front. Do __1__ sessions per day.  Copyright  VHI. All rights reserved.

## 2018-01-20 NOTE — Telephone Encounter (Signed)
Last OV 12/26/17 no f/u scheduled.  Please advise, it looks like he was on metformin a year ago and atoravastatin was last given on 12/18/17 by Francia Greaves.

## 2018-01-21 ENCOUNTER — Encounter: Payer: BLUE CROSS/BLUE SHIELD | Admitting: Occupational Therapy

## 2018-01-21 ENCOUNTER — Encounter: Payer: Self-pay | Admitting: Physical Therapy

## 2018-01-21 ENCOUNTER — Ambulatory Visit: Payer: BLUE CROSS/BLUE SHIELD | Admitting: Physical Therapy

## 2018-01-21 DIAGNOSIS — R2681 Unsteadiness on feet: Secondary | ICD-10-CM | POA: Diagnosis not present

## 2018-01-21 DIAGNOSIS — M6281 Muscle weakness (generalized): Secondary | ICD-10-CM

## 2018-01-21 MED ORDER — ATORVASTATIN CALCIUM 80 MG PO TABS: 80.0000 mg | ORAL_TABLET | Freq: Every day | ORAL | 3 refills | Status: DC

## 2018-01-21 MED ORDER — METFORMIN HCL 1000 MG PO TABS: 1000.0000 mg | ORAL_TABLET | Freq: Two times a day (BID) | ORAL | 3 refills | Status: AC

## 2018-01-21 NOTE — Therapy (Signed)
[facility] [address] [place], [place], [address] Phone: [phone]   Fax:  [phone]  Physical Therapy Treatment  Patient Details  Name: Jonathan Miles MRN: [id number] Date of Birth: 1961/07/26 Referring Provider: Antony Contras   Encounter Date: 01/21/2018  PT End of Session - 01/21/18 1101    Visit Number  7    Number of Visits  15    Date for PT Re-Evaluation  02/19/18    Authorization Type  BC[employer]; PT and OT combined limit 30 visits    Authorization - Visit Number  11 includes 4 OT visits    Authorization - Number of Visits  30 PT/OT combined 30 VL    PT Start Time  0805    PT Stop Time  0845    PT Time Calculation (min)  40 min    Activity Tolerance  Patient tolerated treatment well    Behavior During Therapy  [facility] for tasks assessed/performed       Past Medical History:  Diagnosis Date  . Alcohol abuse   . Aortic root dilatation (HCC)    a. echo 4/12: EF 55-60%, mod to marked dilated ascending aortic root;   b. MRA 4/12: Aortic root 5.5 cm, dilation of left and right coronary cusps, trileaflet aortic valve // Echo 2/19: Moderate LVH, EF 55-60, normal wall motion, grade 1 diastolic dysfunction, mild AI, mildly dilated aortic root, moderate to severe LAE (aortic root 40 mm; ascending aorta 39 mm)   . Arthritis   . Asthma    ast attack in early 20's  . CAD (coronary artery disease)   . Chest pain    Myoview 4/12: EF 62%, no ischemia or scar  . CHF (congestive heart failure) ([place])   . Chronic pain    back and hands  . Constipation   . COPD (chronic obstructive pulmonary disease) ([place])   . Depression   . Diabetes mellitus    type 2  . Drug use   . Enlarged prostate   . Fatigue   . GERD (gastroesophageal reflux disease)    modifies with diet  . Hx of echocardiogram    Echo (2/16):  Mod LVH, EF 55-60%, mild to mod AI, mod LAE, mild RAE, Aortic root 38 mm, Asc aorta 43 mm  . Hyperlipidemia   .  Hypertension   . Joint pain   . Knee pain, bilateral   . Leg pain    ABIs 3/19: Normal (R 1.3; L 1.28)  . OSA (obstructive sleep apnea) 2006   .  Wears at times  . Osteoarthritis   . Palpitations     Past Surgical History:  Procedure Laterality Date  . APPENDECTOMY  12/2016  . Colonscopy    . CORONARY ARTERY BYPASS GRAFT  05/07/2012   Procedure: CORONARY ARTERY BYPASS GRAFTING (CABG);  Surgeon: Grace Isaac, MD;  Location: [facility];  Service: Open Heart Surgery;  Laterality: N/A;  . KNEE ARTHROSCOPY Left 2010   Dr. Rosamaria Lints  . LAPAROSCOPIC APPENDECTOMY N/A 01/06/2017   Procedure: APPENDECTOMY LAPAROSCOPIC;  Surgeon: Ralene Ok, MD;  Location: [facility];  Service: General;  Laterality: N/A;  . Retactment of fingers     as a child , sewed with sewing machine- Index and middle finger  . UPPER GASTROINTESTINAL ENDOSCOPY      There were no vitals filed for this visit.  Subjective Assessment - 01/21/18 0807    Subjective  Bowled 3 games the other night. first two games  bowled his average and then got tired and dropped 40 points. Very sore knees, back, and right shoulder.     Patient is accompained by:  Family member    Pertinent History  CAD, CHF, HTN, COPD, DM, bil plant fasc, bil knee OA,     Patient Stated Goals  I've got to get my balance, strength, coordination back together; be able to return to work    Currently in Pain?  Yes    Pain Score  6     Pain Location  Knee    Pain Orientation  Right;Left    Pain Descriptors / Indicators  Aching    Pain Type  Chronic pain    Pain Onset  More than a month ago    Pain Frequency  Intermittent    Pain Onset  --    Pain Onset  --                      [facility] Adult PT Treatment/Exercise - 01/21/18 0809      Ambulation/Gait   Ambulation/Gait  Yes    Ambulation/Gait Assistance  7: Independent    Ambulation/Gait Assistance Details  with varying speeds, tight spaces, turns, tossing      Lumbar Exercises: Stretches    Single Knee to Chest Stretch  Right;Left;2 reps;30 seconds    Lower Trunk Rotation  2 reps;30 seconds      Lumbar Exercises: Aerobic   Other Aerobic Exercise  sciFit L2 x 5 min      Lumbar Exercises: Supine   Bridge  5 reps;5 seconds      Knee/Hip Exercises: Machines for Strengthening   Cybex Leg Press  100# x 10 reps; 130# x 2 reps (knee pain)      Knee/Hip Exercises: Standing   Other Standing Knee Exercises  lifting 25# x 5 counter to 14" stool; 25# x 5 floor to counter; 30# x 5 x 2 sets counter to 14" stool;                  PT Education - 01/21/18 1827    Education Details  see additions to HEP    Person(s) Educated  Patient    Methods  Explanation;Demonstration;Handout;Verbal cues    Comprehension  Verbalized understanding;Returned demonstration;Verbal cues required;Need further instruction       PT Short Term Goals - 12/29/17 2141      PT SHORT TERM GOAL #1   Title  Patient will be independent with HEP addressing balance and LE strength (Target date 01/29/18)    Time  4    Period  Weeks    Status  New    Target Date  01/29/18      PT SHORT TERM GOAL #2   Title  Patient will improve gait velocity to >=2.62 ft/sec to demonstrate increased safety with community level ambulation and lesser fall risk.    Baseline  2.26 ft/sec    Time  4    Period  Weeks    Status  New      PT SHORT TERM GOAL #3   Title  Patient will improve Berg Balance score to >=52/56 to show decreased fall risk.    Time  4    Period  Weeks    Status  New      PT SHORT TERM GOAL #4   Title  Patient will ambulate 1000 ft over paved outdoor terrain with [facility] and modified independent.    Time  4    Period  Weeks    Status  New      PT SHORT TERM GOAL #5   Title  Patient can ascend/descend 12 steps alternating pattern with SPC and supervision (demonstrating improved balance and control on stairs)    Time  4    Period  Weeks    Status  New        PT Long Term Goals - 12/29/17 2148       PT LONG TERM GOAL #1   Title  Patient will be independent with updated HEP and able to verbalize a plan for community-based activity upone discharge from PT.     Time  7    Period  Weeks    Status  New    Target Date  02/19/18      PT LONG TERM GOAL #2   Title  Patient will improve gait velocity to >= 3.0 ft/sec to show improved safety in community ambulation (and approaching normal velocity for male 38-59 yo)    Time  7    Period  Weeks    Status  New      PT LONG TERM GOAL #3   Title  Patient will ambulate 1000 ft over outdoor unlevel terrain (ramps, curbs, grass, gravel) with LRAD and no LOB.     Time  7    Period  Weeks    Status  New      PT LONG TERM GOAL #4   Title  Patient will ascend/descend 12 steps alternating pattern with no UE support.     Time  7    Period  Weeks    Status  New            Plan - 01/21/18 1827    Clinical Impression Statement  Session included strength training which included repeated lifting from high to low surface to floor 25# progressing to 30# for work simulation. Gait training with dual tasks (physical and cognitive) and LE strengthening also completed. Discussed patient's need for increased activity and weightlifting (work requires ability to lift 100#) and patient is considering joining a gym. Patient making excellent progress and anticipate may progress to FCE with ? work hardening prior to return to work.     Rehab Potential  Good    Clinical Impairments Affecting Rehab Potential  bil knee pain due to arthritis (left worst); peripheral neuropathy    PT Frequency  2x / week    PT Duration  Other (comment)    PT Treatment/Interventions  ADLs/Self Care Home Management;Aquatic Therapy;Electrical Stimulation;DME Instruction;Gait training;Neuromuscular re-education;Balance training;Therapeutic exercise;Therapeutic activities;Functional mobility training;Stair training;Patient/family education;Orthotic Fit/Training;Passive range of motion     PT Next Visit Plan  Plan to d/c early from PT to save some therapy visits for later in the year. work on return to work (including lifting up to 50-100 pounds); Focus on strengthening LEs (per pt, feels his balance is improving). ?d/c 4/9-ish with recommendation for FCE    Consulted and Agree with Plan of Care  Patient       Patient will benefit from skilled therapeutic intervention in order to improve the following deficits and impairments:  Abnormal gait, Decreased balance, Decreased mobility, Decreased knowledge of use of DME, Decreased coordination, Decreased range of motion, Decreased strength, Impaired sensation, Postural dysfunction, Impaired UE functional use, Obesity, Pain  Visit Diagnosis: Muscle weakness (generalized)  Unsteadiness on feet     Problem List Patient Active Problem List   Diagnosis Date Noted  .  Right sided weakness 12/26/2017  . Stroke ([place]) 12/16/2017  . Difficulty speaking   . BMI 36.0-36.9,adult 08/05/2017  . Cholelithiasis 08/05/2017  . Bilateral plantar fasciitis 08/05/2017  . Situational mixed anxiety and depressive disorder 08/05/2017  . Bilateral primary osteoarthritis of knee 07/01/2017  . Asthma 01/26/2017  . Overactive bladder 01/21/2017  . BPH with obstruction/lower urinary tract symptoms 01/21/2017  . S/P appendectomy 01/07/2017  . Diverticulosis 11/24/2016  . Type 2 diabetes mellitus without complication, without long-term current use of insulin ([place]) 10/31/2016  . Aortic insufficiency 11/23/2012  . Chronic diastolic CHF (congestive heart failure) ([place]) 11/23/2012  . CAD (coronary artery disease) 11/27/2011  . OSA (obstructive sleep apnea) 11/11/2011  . Aortic root dilatation ([place])   . Hyperlipidemia 01/23/2011  . Hypertension 01/23/2011    Rexanne Mano, PT 01/21/2018, 6:39 PM  [facility] [address] [place], [place], [address] Phone: [phone]   Fax:   [phone]  Name: Jonathan Miles MRN: [id number] Date of Birth: August 17, 1961

## 2018-01-21 NOTE — Telephone Encounter (Signed)
The doses of these medications has changed since his recent stroke. I have refilled the CURRENT doses, rather than the old doses on the pended orders.  Meds ordered this encounter  Medications  . atorvastatin (LIPITOR) 80 MG tablet    Sig: Take 1 tablet (80 mg total) by mouth daily at 6 PM.    Dispense:  90 tablet    Refill:  3    Order Specific Question:   Supervising Provider    Answer:   Brigitte Pulse, EVA N [[id number]]  . metFORMIN (GLUCOPHAGE) 1000 MG tablet    Sig: Take 1 tablet (1,000 mg total) by mouth 2 (two) times daily with a meal.    Dispense:  180 tablet    Refill:  3    Order Specific Question:   Supervising Provider    Answer:   Brigitte Pulse, EVA N [[id number]]

## 2018-01-21 NOTE — Patient Instructions (Addendum)
   Lumbar Rotation: Caudal - Bilateral, Advanced (Supine)    Feet and knees together, arms outstretched, bring knees toward chest. Rotate knees to left, turning head in opposite direction until stretch is felt. Hold __30__ seconds. Relax. Repeat __3__ times per side.    Bridging    Slowly raise buttocks from floor, keeping stomach tight. Count out loud for 5 seconds. Repeat __5-10__ times per set. Do ___2_ sets per session. Do __1__ sessions per day.       Knee to Chest (Flexion)    Pull knee toward chest. Feel stretch in lower back or buttock area. Breathing deeply, Hold __30__ seconds. Repeat with other knee. Repeat __3__ times.

## 2018-01-22 ENCOUNTER — Telehealth: Payer: Self-pay | Admitting: Adult Health

## 2018-01-22 NOTE — Telephone Encounter (Signed)
3/29- called pt, no answer but LVM to make pt aware we have [place] form and there is a $50 fee before the form can be filled out.

## 2018-01-23 ENCOUNTER — Encounter: Payer: Self-pay | Admitting: Physician Assistant

## 2018-01-23 ENCOUNTER — Other Ambulatory Visit: Payer: Self-pay

## 2018-01-23 ENCOUNTER — Ambulatory Visit (INDEPENDENT_AMBULATORY_CARE_PROVIDER_SITE_OTHER): Payer: BLUE CROSS/BLUE SHIELD | Admitting: Physician Assistant

## 2018-01-23 VITALS — BP 120/78 | HR 79 | Temp 98.2°F | Ht 74.75 in | Wt 300.4 lb

## 2018-01-23 DIAGNOSIS — Z8673 Personal history of transient ischemic attack (TIA), and cerebral infarction without residual deficits: Secondary | ICD-10-CM

## 2018-01-23 DIAGNOSIS — I6930 Unspecified sequelae of cerebral infarction: Secondary | ICD-10-CM | POA: Diagnosis not present

## 2018-01-23 DIAGNOSIS — M170 Bilateral primary osteoarthritis of knee: Secondary | ICD-10-CM

## 2018-01-23 DIAGNOSIS — I10 Essential (primary) hypertension: Secondary | ICD-10-CM | POA: Diagnosis not present

## 2018-01-23 DIAGNOSIS — F4323 Adjustment disorder with mixed anxiety and depressed mood: Secondary | ICD-10-CM | POA: Diagnosis not present

## 2018-01-23 MED ORDER — LIDOCAINE 5 % EX OINT: 1.0000 "application " | TOPICAL_OINTMENT | CUTANEOUS | 3 refills | Status: AC | PRN

## 2018-01-23 NOTE — Assessment & Plan Note (Signed)
RIGHT sided weakness and gait instability are improving. Anticipate complete resolution with continued therapies.

## 2018-01-23 NOTE — Assessment & Plan Note (Signed)
Remains well controlled. Continue current treatment.

## 2018-01-23 NOTE — Assessment & Plan Note (Signed)
No longer able to use NSAIDS. Lidocaine gel prn.

## 2018-01-23 NOTE — Assessment & Plan Note (Signed)
Improved. Anticipates resolution with return to work.

## 2018-01-23 NOTE — Progress Notes (Signed)
Patient ID: Jonathan Miles, male    DOB: [birth year], 57 y.o.   MRN: [id number]  PCP: Harrison Mons, PA-C  Chief Complaint  Patient presents with  . Cerebrovascular accident (CVA), unspecified mechanism ([place])    Follow up    Subjective:   Presents for evaluation of HTN, following CVA 12/16/2017.  At his visit with me 12/26/2017, BP was well controlled.  He continues follow-up with cardiology and with rehab. Reduced strength on the RIGHT UE is essentially back to normal, and he's been discharged from OT. Gait unsteadiness is improving. No longer using assistive device (cane). Continues PT BIW, and HEP daily. Neurology follow-up is 02/03/18.  Home glucose readings 92-120's.  Mood is improved, but still down. Knows he needs to be 100% before returns to work. Not released to drive. Has resumed bowling, but noticed reduced stamina.  Review of Systems  Constitutional: Negative for activity change, appetite change, fatigue and unexpected weight change.  HENT: Negative for congestion, dental problem, ear pain, hearing loss, mouth sores, postnasal drip, rhinorrhea, sneezing, sore throat, tinnitus and trouble swallowing.   Eyes: Negative for photophobia, pain, redness and visual disturbance.  Respiratory: Negative for cough, chest tightness and shortness of breath.   Cardiovascular: Negative for chest pain, palpitations and leg swelling.  Gastrointestinal: Negative for abdominal pain, blood in stool, constipation, diarrhea, nausea and vomiting.  Endocrine: Positive for polyuria (nocturia, with some urgency). Negative for cold intolerance, heat intolerance, polydipsia and polyphagia.  Genitourinary: Negative for dysuria, frequency, hematuria and urgency.  Musculoskeletal: Negative for arthralgias, gait problem, myalgias and neck stiffness.  Skin: Negative for rash.  Allergic/Immunologic: Negative.   Neurological: Negative for dizziness, speech difficulty, weakness, light-headedness,  numbness and headaches.  Hematological: Negative for adenopathy.  Psychiatric/Behavioral: Positive for dysphoric mood. Negative for confusion and sleep disturbance. The patient is not nervous/anxious.      Depression screen [facility] 2/9 01/23/2018 01/23/2018 12/26/2017 09/15/2017 08/05/2017  Decreased Interest 0 0 0 1 1  Down, Depressed, Hopeless 1 1 0 1 1  PHQ - 2 Score 1 1 0 2 2  Altered sleeping 1 - - 0 1  Tired, decreased energy 1 - - 3 3  Change in appetite 0 - - 1 1  Feeling bad or failure about yourself  0 - - 1 0  Trouble concentrating 1 - - 2 1  Moving slowly or fidgety/restless 0 - - 2 1  Suicidal thoughts 0 - - 0 0  PHQ-9 Score 4 - - 11 9  Difficult doing work/chores Not difficult at all - - Somewhat difficult Somewhat difficult     Patient Active Problem List   Diagnosis Date Noted  . Right sided weakness 12/26/2017  . Stroke ([place]) 12/16/2017  . Difficulty speaking   . BMI 36.0-36.9,adult 08/05/2017  . Cholelithiasis 08/05/2017  . Bilateral plantar fasciitis 08/05/2017  . Situational mixed anxiety and depressive disorder 08/05/2017  . Bilateral primary osteoarthritis of knee 07/01/2017  . Asthma 01/26/2017  . Overactive bladder 01/21/2017  . BPH with obstruction/lower urinary tract symptoms 01/21/2017  . S/P appendectomy 01/07/2017  . Diverticulosis 11/24/2016  . Type 2 diabetes mellitus without complication, without long-term current use of insulin ([place]) 10/31/2016  . Aortic insufficiency 11/23/2012  . Chronic diastolic CHF (congestive heart failure) ([place]) 11/23/2012  . CAD (coronary artery disease) 11/27/2011  . OSA (obstructive sleep apnea) 11/11/2011  . Aortic root dilatation ([place])   . Hyperlipidemia 01/23/2011  . Hypertension 01/23/2011     Prior to Admission  medications   Medication Sig Start Date End Date Taking? Authorizing Provider  acetaminophen (TYLENOL) 500 MG tablet TAKE 1-2 TABS EVERY 6 HOURS AS NEEDED FOR PAIN 05/26/12  Yes Richardson Dopp T, PA-C    aspirin EC 325 MG EC tablet Take 1 tablet (325 mg total) by mouth at bedtime. 12/18/17  Yes Kayleen Memos, DO  atorvastatin (LIPITOR) 80 MG tablet Take 1 tablet (80 mg total) by mouth daily at 6 PM. 01/21/18  Yes Murrell Elizondo, PA-C  carvedilol (COREG) 12.5 MG tablet TAKE ONE TABLET BY MOUTH TWO TIMES A DAY 01/04/18  Yes Jalasia Eskridge, PA-C  felodipine (PLENDIL) 10 MG 24 hr tablet TAKE ONE TABLET BY MOUTH DAILY 11/03/17  Yes Rashana Andrew, PA-C  furosemide (LASIX) 40 MG tablet TAKE ONE TABLET BY MOUTH DAILY 11/03/17  Yes Mariusz Jubb, PA-C  lidocaine (XYLOCAINE) 5 % ointment Apply 1 application topically as needed. Daily for leg pain 01/08/18  Yes Tahlia Deamer, PA-C  lisinopril (PRINIVIL,ZESTRIL) 20 MG tablet TAKE ONE TABLET BY MOUTH DAILY 11/03/17  Yes Mishaal Lansdale, PA-C  loratadine (CLARITIN) 10 MG tablet Take 10 mg by mouth at bedtime.    Yes [provider]  metFORMIN (GLUCOPHAGE) 1000 MG tablet Take 1 tablet (1,000 mg total) by mouth 2 (two) times daily with a meal. 01/21/18  Yes Arrick Dutton, PA-C  mirabegron ER (MYRBETRIQ) 50 MG TB24 tablet Take 50 mg by mouth at bedtime.    Yes [provider]  promethazine (PHENERGAN) 25 MG tablet Take 1 tablet (25 mg total) by mouth every 6 (six) hours as needed for nausea or vomiting. 06/08/17  Yes Lawyer, Harrell Gave, PA-C  sertraline (ZOLOFT) 100 MG tablet Take 1 tablet (100 mg total) by mouth daily. 09/15/17  Yes Zakiyah Diop, PA-C  tamsulosin (FLOMAX) 0.4 MG CAPS capsule Take 2 capsules (0.8 mg total) by mouth daily after supper. 09/15/17  Yes Harrison Mons, PA-C     Allergies  Allergen Reactions  . Ibuprofen Other (See Comments)    TOLD NOT TO TAKE-PER CARDS  . Iodinated Diagnostic Agents Hives    Hives started 4 days after cta chest was performed, minimal relief w/ benadryl x 3 days, uncertain if reaction was actually iv contrast or other allergen,allergy tests to follow.wife will make [place] aware of  results//a.[place]       Objective:  Physical Exam  Constitutional: He is oriented to person, place, and time. He appears well-developed and well-nourished. He is active and cooperative. No distress.  BP 120/78   Pulse 79   Temp 98.2 F (36.8 C)   Ht 6' 2.75" (1.899 m)   Wt (!) 300 lb 6.4 oz (136.3 kg)   SpO2 97%   BMI 37.80 kg/m   HENT:  Head: Normocephalic and atraumatic.  Right Ear: Hearing normal.  Left Ear: Hearing normal.  Eyes: Conjunctivae are normal. No scleral icterus.  Neck: Normal range of motion. Neck supple. No thyromegaly present.  Cardiovascular: Normal rate, regular rhythm and normal heart sounds.  Pulses:      Radial pulses are 2+ on the right side, and 2+ on the left side.  Pulmonary/Chest: Effort normal and breath sounds normal.  Lymphadenopathy:       Head (right side): No tonsillar, no preauricular, no posterior auricular and no occipital adenopathy present.       Head (left side): No tonsillar, no preauricular, no posterior auricular and no occipital adenopathy present.    He has no cervical adenopathy.  Right: No supraclavicular adenopathy present.       Left: No supraclavicular adenopathy present.  Neurological: He is alert and oriented to person, place, and time. No sensory deficit.  Skin: Skin is warm, dry and intact. No rash noted. No cyanosis or erythema. Nails show no clubbing.  Psychiatric: He has a normal mood and affect. His speech is normal and behavior is normal.   Wt Readings from Last 3 Encounters:  01/23/18 (!) 300 lb 6.4 oz (136.3 kg)  12/26/17 298 lb (135.2 kg)  12/17/17 293 lb 14 oz (133.3 kg)       Assessment & Plan:   Problem List Items Addressed This Visit    Hypertension    Remains well controlled. Continue current treatment.      Bilateral primary osteoarthritis of knee - Primary    No longer able to use NSAIDS. Lidocaine gel prn.      Relevant Medications   lidocaine (XYLOCAINE) 5 % ointment   Situational  mixed anxiety and depressive disorder    Improved. Anticipates resolution with return to work.      History of stroke    Nearly back to baseline. COntinue PT. Follow-up with neurology as planned 02/03/2018.      Late effect of stroke    RIGHT sided weakness and gait instability are improving. Anticipate complete resolution with continued therapies.          Return in about 7 weeks (around 03/13/2018) for re-evalautaion of diabetes, choelsterol, etc with fasting labs.   Fara Chute, PA-C [facility] [place]

## 2018-01-23 NOTE — Patient Instructions (Signed)
     IF you received an x-ray today, you will receive an invoice from [facility]. Please contact [facility] at [phone] with questions or concerns regarding your invoice.   IF you received labwork today, you will receive an invoice from [facility]. Please contact [facility] at [phone] with questions or concerns regarding your invoice.   Our billing staff will not be able to assist you with questions regarding bills from these companies.  You will be contacted with the lab results as soon as they are available. The fastest way to get your results is to activate your My Chart account. Instructions are located on the last page of this paperwork. If you have not heard from us regarding the results in 2 weeks, please contact this office.     

## 2018-01-23 NOTE — Assessment & Plan Note (Signed)
Nearly back to baseline. COntinue PT. Follow-up with neurology as planned 02/03/2018.

## 2018-01-25 ENCOUNTER — Telehealth: Payer: Self-pay | Admitting: *Deleted

## 2018-01-25 NOTE — Telephone Encounter (Signed)
Pt   [place] form on [employer].

## 2018-01-26 ENCOUNTER — Encounter: Payer: BLUE CROSS/BLUE SHIELD | Admitting: Occupational Therapy

## 2018-01-26 ENCOUNTER — Encounter: Payer: Self-pay | Admitting: Physical Therapy

## 2018-01-26 ENCOUNTER — Ambulatory Visit: Payer: BLUE CROSS/BLUE SHIELD | Attending: Internal Medicine | Admitting: Physical Therapy

## 2018-01-26 DIAGNOSIS — R2689 Other abnormalities of gait and mobility: Secondary | ICD-10-CM | POA: Diagnosis present

## 2018-01-26 DIAGNOSIS — M6281 Muscle weakness (generalized): Secondary | ICD-10-CM

## 2018-01-26 DIAGNOSIS — R2681 Unsteadiness on feet: Secondary | ICD-10-CM | POA: Diagnosis present

## 2018-01-26 DIAGNOSIS — I69351 Hemiplegia and hemiparesis following cerebral infarction affecting right dominant side: Secondary | ICD-10-CM | POA: Insufficient documentation

## 2018-01-26 DIAGNOSIS — Z0289 Encounter for other administrative examinations: Secondary | ICD-10-CM

## 2018-01-26 NOTE — Telephone Encounter (Signed)
If patient calls back give him the following message: IF the form is due before 02/03/2018 he would need a sooner appt to see Janett Billow NP.    Left vm for patient about attending physician form.. RN stated the form cannot be done until he is seen by Janett Billow NP on 02/03/2018. RN left message wanting to know if the form can wait till 02/03/2018 to be completed. Rn also left message that if its due before 02/03/2018 he would need to be seen sooner by Janett Billow NP.

## 2018-01-26 NOTE — Therapy (Signed)
[facility] [address] [place], [place], [address] Phone: [phone]   Fax:  [phone]  Physical Therapy Treatment  Patient Details  Name: Jonathan Miles MRN: [id number] Date of Birth: 1961-08-25 Referring Provider: Antony Contras   Encounter Date: 01/26/2018  PT End of Session - 01/26/18 0817    Visit Number  8    Number of Visits  15    Date for PT Re-Evaluation  02/19/18    Authorization Type  [employer]; PT and OT combined limit 30 visits    Authorization - Visit Number  12 includes 4 OT visits    Authorization - Number of Visits  30 PT/OT combined 30 VL    PT Start Time  0802    PT Stop Time  0842    PT Time Calculation (min)  40 min    Activity Tolerance  Patient tolerated treatment well    Behavior During Therapy  [facility] for tasks assessed/performed       Past Medical History:  Diagnosis Date  . Alcohol abuse   . Aortic root dilatation (H[facility])    a. echo 4/12: EF 55-60%, mod to marked dilated ascending aortic root;   b. MRA 4/12: Aortic root 5.5 cm, dilation of left and right coronary cusps, trileaflet aortic valve // Echo 2/19: Moderate LVH, EF 55-60, normal wall motion, grade 1 diastolic dysfunction, mild AI, mildly dilated aortic root, moderate to severe LAE (aortic root 40 mm; ascending aorta 39 mm)   . Arthritis   . Asthma    ast attack in early 20's  . CAD (coronary artery disease)   . Chest pain    Myoview 4/12: EF 62%, no ischemia or scar  . CHF (congestive heart failure) ([place])   . Chronic pain    back and hands  . Constipation   . COPD (chronic obstructive pulmonary disease) ([place])   . Depression   . Diabetes mellitus    type 2  . Difficulty speaking   . Drug use   . Enlarged prostate   . Fatigue   . GERD (gastroesophageal reflux disease)    modifies with diet  . Hx of echocardiogram    Echo (2/16):  Mod LVH, EF 55-60%, mild to mod AI, mod LAE, mild RAE, Aortic root 38 mm, Asc aorta 43 mm  .  Hyperlipidemia   . Hypertension   . Joint pain   . Knee pain, bilateral   . Leg pain    ABIs 3/19: Normal (R 1.3; L 1.28)  . OSA (obstructive sleep apnea) 2006   .  Wears at times  . Osteoarthritis   . Palpitations     Past Surgical History:  Procedure Laterality Date  . APPENDECTOMY  12/2016  . Colonscopy    . CORONARY ARTERY BYPASS GRAFT  05/07/2012   Procedure: CORONARY ARTERY BYPASS GRAFTING (CABG);  Surgeon: Grace Isaac, MD;  Location: [facility];  Service: Open Heart Surgery;  Laterality: N/A;  . KNEE ARTHROSCOPY Left 2010   Dr. Rosamaria Lints  . LAPAROSCOPIC APPENDECTOMY N/A 01/06/2017   Procedure: APPENDECTOMY LAPAROSCOPIC;  Surgeon: Ralene Ok, MD;  Location: [facility];  Service: General;  Laterality: N/A;  . Retactment of fingers     as a child , sewed with sewing machine- Index and middle finger  . UPPER GASTROINTESTINAL ENDOSCOPY      There were no vitals filed for this visit.  Subjective Assessment - 01/26/18 0803    Subjective  Working with Dr Leonie Man  re: short-term disability and return to work. Employer has never required FCE for return to work. Simply an MD's note    Patient is accompained by:  Family member    Pertinent History  CAD, CHF, HTN, COPD, DM, bil plant fasc, bil knee OA,     Patient Stated Goals  I've got to get my balance, strength, coordination back together; be able to return to work    Currently in Pain?  Yes    Pain Score  1     Pain Location  Back    Pain Orientation  Lower    Pain Descriptors / Indicators  Aching    Pain Type  Chronic pain    Pain Onset  More than a month ago                       [facility] Adult PT Treatment/Exercise - 01/26/18 0810      Ambulation/Gait   Stairs  Yes    Stairs Assistance  6: Modified independent (Device/Increase time)    Stair Management Technique  One rail Right;Forwards;Alternating pattern    Number of Stairs  24 (consecutively)   Height of Stairs  6      Lumbar Exercises: Aerobic   Tread  Mill  2.72mph @ 1% grade x 6.5 minutes HR 108 bpm    Other Aerobic Exercise  sciFit L2 x 4 min (2 min all 4's; 2 min LE only)      Lumbar Exercises: Machines for Strengthening   Other Lumbar Machine Exercise  tall kneeling on mat; 2# ball D1, D2 PNF patterns each side x 10       Lumbar Exercises: Supine   Bridge  10 reps;5 seconds;Compliant      Knee/Hip Exercises: Standing   Other Standing Knee Exercises  lifting from counter to 14" height x 10 30# turn to right; 30# to floor turn to left x 10; 40#  to 14" stool x 5               PT Short Term Goals - 12/29/17 2141      PT SHORT TERM GOAL #1   Title  Patient will be independent with HEP addressing balance and LE strength (Target date 01/29/18)    Time  4    Period  Weeks    Status  New    Target Date  01/29/18      PT SHORT TERM GOAL #2   Title  Patient will improve gait velocity to >=2.62 ft/sec to demonstrate increased safety with community level ambulation and lesser fall risk.    Baseline  2.26 ft/sec    Time  4    Period  Weeks    Status  New      PT SHORT TERM GOAL #3   Title  Patient will improve Berg Balance score to >=52/56 to show decreased fall risk.    Time  4    Period  Weeks    Status  New      PT SHORT TERM GOAL #4   Title  Patient will ambulate 1000 ft over paved outdoor terrain with [facility] and modified independent.    Time  4    Period  Weeks    Status  New      PT SHORT TERM GOAL #5   Title  Patient can ascend/descend 12 steps alternating pattern with SPC and supervision (demonstrating improved balance and control on stairs)    Time  4    Period  Weeks    Status  New        PT Long Term Goals - 12/29/17 2148      PT LONG TERM GOAL #1   Title  Patient will be independent with updated HEP and able to verbalize a plan for community-based activity upone discharge from PT.     Time  7    Period  Weeks    Status  New    Target Date  02/19/18      PT LONG TERM GOAL #2   Title  Patient will  improve gait velocity to >= 3.0 ft/sec to show improved safety in community ambulation (and approaching normal velocity for male 45-59 yo)    Time  7    Period  Weeks    Status  New      PT LONG TERM GOAL #3   Title  Patient will ambulate 1000 ft over outdoor unlevel terrain (ramps, curbs, grass, gravel) with LRAD and no LOB.     Time  7    Period  Weeks    Status  New      PT LONG TERM GOAL #4   Title  Patient will ascend/descend 12 steps alternating pattern with no UE support.     Time  7    Period  Weeks    Status  New            Plan - 01/26/18 2014    Clinical Impression Statement  Continued focus on strengthening, gait, and balance with purpose of patient wanting to be able to return to work. He demonstrates excellent technique with lifting objects up to 40# and reports slight increase in his back pain. He is highly motivated and making progress. Will assess STGs next visit.    Rehab Potential  Good    Clinical Impairments Affecting Rehab Potential  bil knee pain due to arthritis (left worst); peripheral neuropathy    PT Frequency  2x / week    PT Duration  Other (comment)    PT Treatment/Interventions  ADLs/Self Care Home Management;Aquatic Therapy;Electrical Stimulation;DME Instruction;Gait training;Neuromuscular re-education;Balance training;Therapeutic exercise;Therapeutic activities;Functional mobility training;Stair training;Patient/family education;Orthotic Fit/Training;Passive range of motion    PT Next Visit Plan  check STGs; Plan to d/c early from PT to save some therapy visits for later in the year. work on return to work (including lifting up to 50-100 pounds); Focus on strengthening LEs (per pt, feels his balance is improving). ?d/c 4/9-ish with recommendation for FCE    Consulted and Agree with Plan of Care  Patient       Patient will benefit from skilled therapeutic intervention in order to improve the following deficits and impairments:  Abnormal gait,  Decreased balance, Decreased mobility, Decreased knowledge of use of DME, Decreased coordination, Decreased range of motion, Decreased strength, Impaired sensation, Postural dysfunction, Impaired UE functional use, Obesity, Pain  Visit Diagnosis: Muscle weakness (generalized)  Other abnormalities of gait and mobility     Problem List Patient Active Problem List   Diagnosis Date Noted  . Late effect of stroke 12/26/2017  . History of stroke 12/16/2017  . BMI 36.0-36.9,adult 08/05/2017  . Cholelithiasis 08/05/2017  . Bilateral plantar fasciitis 08/05/2017  . Situational mixed anxiety and depressive disorder 08/05/2017  . Bilateral primary osteoarthritis of knee 07/01/2017  . Asthma 01/26/2017  . Overactive bladder 01/21/2017  . BPH with obstruction/lower urinary tract symptoms 01/21/2017  . S/P appendectomy 01/07/2017  . Diverticulosis 11/24/2016  .  Type 2 diabetes mellitus without complication, without long-term current use of insulin ([place]) 10/31/2016  . Aortic insufficiency 11/23/2012  . Chronic diastolic CHF (congestive heart failure) ([place]) 11/23/2012  . CAD (coronary artery disease) 11/27/2011  . OSA (obstructive sleep apnea) 11/11/2011  . Aortic root dilatation ([place])   . Hyperlipidemia 01/23/2011  . Hypertension 01/23/2011    Rexanne Mano, PT 01/26/2018, 8:19 PM  [facility] [address] [place], [place], [address] Phone: [phone]   Fax:  [phone]  Name: Babe Clenney MRN: [id number] Date of Birth: May 21, 1961

## 2018-01-26 NOTE — Telephone Encounter (Signed)
FYI, pt called back and stated his benefits were up at the end of March. Was advised by RN Katrina to schedule pt to see NP Janett Billow this week.  Pt agreed to appointment on 04-03 to check in at 9:15 for a 9:45 appointment. No call back requested

## 2018-01-27 ENCOUNTER — Ambulatory Visit (INDEPENDENT_AMBULATORY_CARE_PROVIDER_SITE_OTHER): Payer: BLUE CROSS/BLUE SHIELD | Admitting: Adult Health

## 2018-01-27 ENCOUNTER — Encounter: Payer: Self-pay | Admitting: Adult Health

## 2018-01-27 VITALS — BP 135/87 | HR 70 | Ht 75.0 in | Wt 304.2 lb

## 2018-01-27 DIAGNOSIS — E785 Hyperlipidemia, unspecified: Secondary | ICD-10-CM

## 2018-01-27 DIAGNOSIS — I10 Essential (primary) hypertension: Secondary | ICD-10-CM

## 2018-01-27 DIAGNOSIS — I6350 Cerebral infarction due to unspecified occlusion or stenosis of unspecified cerebral artery: Secondary | ICD-10-CM

## 2018-01-27 DIAGNOSIS — E119 Type 2 diabetes mellitus without complications: Secondary | ICD-10-CM

## 2018-01-27 NOTE — Patient Instructions (Addendum)
Continue aspirin 325 mg daily  and lipitor  for secondary stroke prevention  Continue to follow up with your PCP regarding blood pressure, cholesterol, and diabetes management  Continue to use CPAP for OSA  continue physical therapy and home exercises   You are okay to drive from a neurology stand point. Please drive short distances and have a family member ride with you for the first couple of times  We will call you to schedule MRA head/neck  Maintain strict control of hypertension with blood pressure goal below 130/90, diabetes with hemoglobin A1c goal below 6.5% and cholesterol with LDL cholesterol (bad cholesterol) goal below 70 mg/dL. I also advised the patient to eat a healthy diet with plenty of whole grains, cereals, fruits and vegetables, exercise regularly and maintain ideal body weight.  Followup in the future with me in 4 weeks for a re-evaluation and possible return to work

## 2018-01-27 NOTE — Progress Notes (Signed)
[facility] [address] [place]. [place] [address] [phone]       OFFICE FOLLOW UP NOTE  [name] Date of Birth:  [birth year] Medical Record Number:  [id number]   Reason for Referral:  hospital stroke follow up  CHIEF COMPLAINT:  Chief Complaint  Patient presents with  . 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 History obtained from patient and chart review. Reviewed all radiology images and labs personally.  Jonathan Miles is a 57 year old male with PMH of DM, HTN, HLD, OSA on CPAP and CAD who presented with acute onset of dizziness, slurred speech and ataxia on 12/16/2017.  CT head reviewed and showed no intracranial abnormality.  MRI brain reviewed and did show an acute infarct in the left pons.  Bilateral carotid ultrasound performed and showed bilateral ICA stenosis of 1-39%.  2D echo performed and showed an EF of 55-60% with moderate LVH. LDL 82 and was recommended to increase Lipitor from 40 mg to 80 mg.  A1c 5.5.  Patient was previously on aspirin 81 mg and was recommended to increase his to aspirin 325 mg daily.  Patient was discharged home in stable condition. Since discharge, patient has been participating in PT/OT.  He is accompanied at today's appointment with his wife.  He completed OT but continues PT 2 times per week.  He is making great improvement but does continue to exhibit some right hemiplegia.  He does complain about frequent fatigue especially after PT sessions or with increased activity.  He does continue to take aspirin without increase in bruising or bleeding.  Continues to take Lipitor with mild aches but able to tolerate them and these are improving.  Today's blood pressure satisfactory at 135/87.  Patient does have OSA and is compliant with CPAP machine.  He does have complaints of a headache that occurs on a weekly basis that  has been occurring for the past 2 years.  He relates these to stress.  They start in the back of his neck and go up into his head and describes it as pressure.  Does have mild photophobia and phonophobia but denies nausea and vomiting.  Does take Tylenol which does seem to relieve these headaches.  He does continue to take Zoloft which has been helping with depression and anxiety.  As patient is employed doing housing maintenance, which consists of heavy lifting, frequent walking, frequent climbing, and a lot of driving, it would not be safe for him to return to work at this time due to continued right sided hemiplegia and balance difficulties, especially as he is unable to return to work if he has restrictions or unable to go back full-time.  It would be in the best interest of the patient if he continues PT 2 times per week and continues home exercises in order to work on balance and increase strength on right side. No new or worsening stroke/TIA symptoms.   ROS:   14 system review of systems performed and negative with exception of fatigue, restless leg, apnea, snoring, excessive thirst, environmental allergies, incontinence of bladder, frequency of urination, joint pain, back pain, aching muscles, neck pain, neck stiffness, headache, weakness, agitation, and depression  PMH:  Past Medical History:  Diagnosis Date  . Alcohol abuse   . Aortic root dilatation (HCC)    a. echo 4/12: EF 55-60%, mod to marked  dilated ascending aortic root;   b. MRA 4/12: Aortic root 5.5 cm, dilation of left and right coronary cusps, trileaflet aortic valve // Echo 2/19: Moderate LVH, EF 55-60, normal wall motion, grade 1 diastolic dysfunction, mild AI, mildly dilated aortic root, moderate to severe LAE (aortic root 40 mm; ascending aorta 39 mm)   . Arthritis   . Asthma    ast attack in early 20's  . CAD (coronary artery disease)   . Chest pain    Myoview 4/12: EF 62%, no ischemia or scar  . CHF (congestive heart  failure) ([place])   . Chronic pain    back and hands  . Constipation   . COPD (chronic obstructive pulmonary disease) ([place])   . Depression   . Diabetes mellitus    type 2  . Difficulty speaking   . Drug use   . Enlarged prostate   . Fatigue   . GERD (gastroesophageal reflux disease)    modifies with diet  . Hx of echocardiogram    Echo (2/16):  Mod LVH, EF 55-60%, mild to mod AI, mod LAE, mild RAE, Aortic root 38 mm, Asc aorta 43 mm  . Hyperlipidemia   . Hypertension   . Joint pain   . Knee pain, bilateral   . Leg pain    ABIs 3/19: Normal (R 1.3; L 1.28)  . OSA (obstructive sleep apnea) 2006   .  Wears at times  . Osteoarthritis   . Palpitations   . Stroke [facility])     PSH:  Past Surgical History:  Procedure Laterality Date  . APPENDECTOMY  12/2016  . Colonscopy    . CORONARY ARTERY BYPASS GRAFT  05/07/2012   Procedure: CORONARY ARTERY BYPASS GRAFTING (CABG);  Surgeon: Grace Isaac, MD;  Location: [facility];  Service: Open Heart Surgery;  Laterality: N/A;  . KNEE ARTHROSCOPY Left 2010   Dr. Rosamaria Lints  . LAPAROSCOPIC APPENDECTOMY N/A 01/06/2017   Procedure: APPENDECTOMY LAPAROSCOPIC;  Surgeon: Ralene Ok, MD;  Location: [facility];  Service: General;  Laterality: N/A;  . Retactment of fingers     as a child , sewed with sewing machine- Index and middle finger  . UPPER GASTROINTESTINAL ENDOSCOPY      Social History:  Social History   Socioeconomic History  . Marital status: Married    Spouse name: Jonathan Miles  . Number of children: 0  . Years of education: 12th grade  . Highest education level: Not on file  Occupational History  . 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  Social Needs  . Financial resource strain: Not on file  . Food insecurity:    Worry: Not on file    Inability: Not on file  . Transportation needs:    Medical: Not on file    Non-medical: Not on file  Tobacco Use  . Smoking status:  Former Smoker    Packs/day: 1.00    Years: 13.00    Pack years: 13.00    Types: Cigarettes    Last attempt to quit: 01/23/1988    Years since quitting: 30.0  . Smokeless tobacco: Never Used  Substance and Sexual Activity  . Alcohol use: Yes    Comment: quit 1989  . Drug use: No    Comment: previous cocaine and marijuana use, quit 1988  . Sexual activity: Not Currently    Partners: Female  Lifestyle  . Physical activity:    Days per week: Not on  file    Minutes per session: Not on file  . Stress: Not on file  Relationships  . Social connections:    Talks on phone: Not on file    Gets together: Not on file    Attends religious service: Not on file    Active member of club or organization: Not on file    Attends meetings of clubs or organizations: Not on file    Relationship status: Not on file  . Intimate partner violence:    Fear of current or ex partner: Not on file    Emotionally abused: Not on file    Physically abused: Not on file    Forced sexual activity: Not on file  Other Topics Concern  . Not on file  Social History Narrative   Married, lives in [place]. Therapist, occupational for [employer].    Pt is adopted. Unsure of his family medical history.    Family History:  Family History  Adopted: Yes  Family history unknown: Yes    Medications:   Current Outpatient Medications on File Prior to Visit  Medication Sig Dispense Refill  . acetaminophen (TYLENOL) 500 MG tablet TAKE 1-2 TABS EVERY 6 HOURS AS NEEDED FOR PAIN    . aspirin EC 325 MG EC tablet Take 1 tablet (325 mg total) by mouth at bedtime. 30 tablet 0  . atorvastatin (LIPITOR) 80 MG tablet Take 1 tablet (80 mg total) by mouth daily at 6 PM. 90 tablet 3  . carvedilol (COREG) 12.5 MG tablet TAKE ONE TABLET BY MOUTH TWO TIMES A DAY 180 tablet 2  . felodipine (PLENDIL) 10 MG 24 hr tablet TAKE ONE TABLET BY MOUTH DAILY 90 tablet 2  . furosemide (LASIX) 40 MG tablet TAKE ONE TABLET BY MOUTH DAILY 90  tablet 2  . lidocaine (XYLOCAINE) 5 % ointment Apply 1 application topically as needed. Daily for leg pain 50 g 3  . lisinopril (PRINIVIL,ZESTRIL) 20 MG tablet TAKE ONE TABLET BY MOUTH DAILY 90 tablet 2  . loratadine (CLARITIN) 10 MG tablet Take 10 mg by mouth at bedtime.     . metFORMIN (GLUCOPHAGE) 1000 MG tablet Take 1 tablet (1,000 mg total) by mouth 2 (two) times daily with a meal. 180 tablet 3  . mirabegron ER (MYRBETRIQ) 50 MG TB24 tablet Take 50 mg by mouth at bedtime.     . sertraline (ZOLOFT) 100 MG tablet Take 1 tablet (100 mg total) by mouth daily. 90 tablet 3  . tamsulosin (FLOMAX) 0.4 MG CAPS capsule Take 2 capsules (0.8 mg total) by mouth daily after supper. 180 capsule 3   No current facility-administered medications on file prior to visit.     Allergies:   Allergies  Allergen Reactions  . Ibuprofen Other (See Comments)    TOLD NOT TO TAKE-PER CARDS  . Iodinated Diagnostic Agents Hives    Hives started 4 days after cta chest was performed, minimal relief w/ benadryl x 3 days, uncertain if reaction was actually iv contrast or other allergen,allergy tests to follow.wife will make [place] aware of results//a.[email or web address]     Physical Exam  Vitals:   01/27/18 0944  BP: 135/87  Pulse: 70  Weight: (!) 304 lb 3.2 oz (138 kg)  Height: 6\' 3"  (1.905 m)   Body mass index is 38.02 kg/m. No exam data present  General: well developed, pleasant middle-aged male, well nourished, seated, in no evident distress Head: head normocephalic and atraumatic.   Neck: supple with no carotid or  supraclavicular bruits Cardiovascular: regular rate and rhythm, no murmurs Musculoskeletal: no deformity Skin:  no rash/petichiae Vascular:  Normal pulses all extremities  Neurologic Exam Mental Status: Awake and fully alert.  No dysarthria present.  Oriented to place and time. Recent and remote memory intact. Attention span, concentration and fund of knowledge appropriate. Mood and affect appropriate.    Cranial Nerves: Fundoscopic exam reveals sharp disc margins. Pupils equal, briskly reactive to light. Extraocular movements full without nystagmus. Visual fields full to confrontation. Hearing intact. Facial sensation intact.  Minor right facial paralysis. Motor: 4/5 strength in RUE and RLE.  5/5 strength in LUE and LLE. Sensory.: intact to touch , pinprick , position and vibratory sensation.  Coordination: Rapid alternating movements normal in all extremities. Finger-to-nose and heel-to-shin performed accurately bilaterally.  Mild decreased right hand dexterity. Gait and Station: Arises from chair without difficulty. Stance is normal. Gait demonstrates normal stride but does have stiffness in right leg while ambulating.  Able to heel, toe and tandem walk with mild difficulty.  Positive Romberg. Reflexes: 1+ and symmetric. Toes downgoing.    NIHSS 1 Modified Rankin  2   Diagnostic Data (Labs, Imaging, Testing)  Ct Head Wo Contrast Result Date: 12/16/2017  IMPRESSION: 1. No evidence of acute intracranial abnormality. 2. Mild chronic small vessel ischemic disease.   [name] Result Date: 12/16/2017 IMPRESSION: 1 cm acute infarct left pons Mild to moderate chronic microvascular ischemic changes in the cerebral white matter bilaterally. Electronically Signed      Echocardiogram:      12/17/2017                                     Study Conclusions - Left ventricle: The cavity size was normal. Wall thickness was increased in a pattern of moderate LVH. Systolic function was normal. The estimated ejection fraction was in the range of 55% to 60%. Wall motion was normal; there were no regional wall motion abnormalities. Doppler parameters are consistent with abnormal left ventricular relaxation (grade 1 diastolic dysfunction). - Aortic valve: There was mild regurgitation. Valve area (VTI): 3.48 cm^2. Valve area (Vmax): 3.49 cm^2. Valve area (Vmean): 3.31 cm^2. -  Aortic root: The aortic root was mildly dilated. - Left atrium: The atrium was moderately to severely dilated.  B/L Carotid U/S:   12/17/2017                                         Final Interpretation: Right Carotid: Velocities in the right ICA are consistent with a 1-39% stenosis. Left Carotid: Velocities in the left ICA are consistent with a 1-39% stenosis. Vertebrals: Both vertebral arteries were patent with antegrade flow. Subclavians: Normal flow hemodynamics were seen in bilateral subclavian arteries.    ASSESSMENT: Jonathan Miles is a 57 y.o. 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 Vascular risk factors include HTN, HLD, DM, and CAD.     PLAN: -Continue aspirin 325 mg daily  and lipitor  for secondary stroke prevention -F/u with PCP regarding your HTN, HLD and DM management -continue to monitor BP at home -From a neurology standpoint, patient is able to start driving.  Does have restrictions as far as having a family member ride with him for the first few times, driving only short  distances that he knows, and avoiding main roadway's -referral for MRA head/neck to finish stroke work up and look at vertebral arteries -Unable to clear patient to return to work at this time as he continues to have right-sided hemiplegia and balance issues.  His work consists of lifting heavy objects, frequent walking, climbing at high heights and driving.  Recommend continuing PT for 4 additional weeks in which she will schedule a follow-up visit to reassess return to work at that time -Maintain strict control of hypertension with blood pressure goal below 130/90, diabetes with hemoglobin A1c goal below 6.5% and cholesterol with LDL cholesterol (bad cholesterol) goal below 70 mg/dL. I also advised the patient to eat a healthy diet with plenty of whole grains, cereals, fruits and vegetables, exercise regularly and maintain ideal body weight.  Follow  up in 4 months or call earlier if needed  Greater than 50% time during this 25 minute consultation visit was spent on counseling and coordination of care about HLD, HTN and DM, discussion about risk benefit of anticoagulation and answering questions.   Venancio Poisson, AGNP-BC  [facility] [address] [place], [place] [address]  Phone [phone] Fax [phone]

## 2018-01-28 ENCOUNTER — Encounter: Payer: Self-pay | Admitting: Physical Therapy

## 2018-01-28 ENCOUNTER — Telehealth: Payer: Self-pay | Admitting: Adult Health

## 2018-01-28 ENCOUNTER — Ambulatory Visit: Payer: BLUE CROSS/BLUE SHIELD | Admitting: Physical Therapy

## 2018-01-28 ENCOUNTER — Encounter: Payer: BLUE CROSS/BLUE SHIELD | Admitting: Occupational Therapy

## 2018-01-28 DIAGNOSIS — M6281 Muscle weakness (generalized): Secondary | ICD-10-CM

## 2018-01-28 DIAGNOSIS — R2681 Unsteadiness on feet: Secondary | ICD-10-CM

## 2018-01-28 NOTE — Telephone Encounter (Signed)
Patient returned my call he is scheduled for 02/03/18 on the [place] mobile unit. He did inform me that he does have allergic reaction to the contrast. I am not sure if you would like to give him a benadryl for him to take before he has the MRA?

## 2018-01-28 NOTE — Telephone Encounter (Signed)
Spoke to Jonathan Miles regarding contrast dye injection from MRA. Per patient, he is allergic to the dye from Echocardiograms that he undergos yearly for his CHF. He is unsure if he is allergic to the contrast dye from MRA as he has never had MRA done prior. Per Dr. Erlinda Hong, patient should tolerate MRA contrast. Explained this to patient and he verbalizes understanding.

## 2018-01-28 NOTE — Telephone Encounter (Signed)
Jonathan Poisson, NP       Note    Spoke to Mr. [place] regarding contrast dye injection from MRA. Per patient, he is allergic to the dye from Echocardiograms that he undergos yearly for his CHF. He is unsure if he is allergic to the contrast dye from MRA as he has never had MRA done prior. Per Dr. Erlinda Hong, patient should tolerate MRA contrast. Explained this to patient and he verbalizes understanding.

## 2018-01-28 NOTE — Therapy (Signed)
[facility] [address] [place], [place], [address] Phone: [phone]   Fax:  [phone]  Physical Therapy Treatment  Patient Details  Name: Jonathan [facility] MRN: [id number] Date of Birth: 11-Dec-1960 Referring Provider: Antony Contras   Encounter Date: 01/28/2018  PT End of Session - 01/28/18 0924    Visit Number  9    Number of Visits  15    Date for PT Re-Evaluation  02/19/18    Authorization Type  [employer]; PT and OT combined limit 30 visits    Authorization - Visit Number  6 includes 4 OT visits    Authorization - Number of Visits  30 PT/OT combined 30 VL    PT Start Time  0849    PT Stop Time  0933    PT Time Calculation (min)  44 min    Activity Tolerance  Patient tolerated treatment well    Behavior During Therapy  [facility] for tasks assessed/performed       Past Medical History:  Diagnosis Date  . Alcohol abuse   . Aortic root dilatation (HCC)    a. echo 4/12: EF 55-60%, mod to marked dilated ascending aortic root;   b. MRA 4/12: Aortic root 5.5 cm, dilation of left and right coronary cusps, trileaflet aortic valve // Echo 2/19: Moderate LVH, EF 55-60, normal wall motion, grade 1 diastolic dysfunction, mild AI, mildly dilated aortic root, moderate to severe LAE (aortic root 40 mm; ascending aorta 39 mm)   . Arthritis   . Asthma    ast attack in early 20's  . CAD (coronary artery disease)   . Chest pain    Myoview 4/12: EF 62%, no ischemia or scar  . CHF (congestive heart failure) ([place])   . Chronic pain    back and hands  . Constipation   . COPD (chronic obstructive pulmonary disease) ([place])   . Depression   . Diabetes mellitus    type 2  . Difficulty speaking   . Drug use   . Enlarged prostate   . Fatigue   . GERD (gastroesophageal reflux disease)    modifies with diet  . Hx of echocardiogram    Echo (2/16):  Mod LVH, EF 55-60%, mild to mod AI, mod LAE, mild RAE, Aortic root 38 mm, Asc aorta 43 mm  .  Hyperlipidemia   . Hypertension   . Joint pain   . Knee pain, bilateral   . Leg pain    ABIs 3/19: Normal (R 1.3; L 1.28)  . OSA (obstructive sleep apnea) 2006   .  Wears at times  . Osteoarthritis   . Palpitations   . Stroke [facility])     Past Surgical History:  Procedure Laterality Date  . APPENDECTOMY  12/2016  . Colonscopy    . CORONARY ARTERY BYPASS GRAFT  05/07/2012   Procedure: CORONARY ARTERY BYPASS GRAFTING (CABG);  Surgeon: Grace Isaac, MD;  Location: [facility];  Service: Open Heart Surgery;  Laterality: N/A;  . KNEE ARTHROSCOPY Left 2010   Dr. Rosamaria Lints  . LAPAROSCOPIC APPENDECTOMY N/A 01/06/2017   Procedure: APPENDECTOMY LAPAROSCOPIC;  Surgeon: Ralene Ok, MD;  Location: [facility];  Service: General;  Laterality: N/A;  . Retactment of fingers     as a child , sewed with sewing machine- Index and middle finger  . UPPER GASTROINTESTINAL ENDOSCOPY      There were no vitals filed for this visit.  Subjective Assessment - 01/28/18 0851    Subjective  Saw the doctor and she has me out of work until May 1st.     Patient is accompained by:  Family member    Pertinent History  CAD, CHF, HTN, COPD, DM, bil plant fasc, bil knee OA,     Patient Stated Goals  I've got to get my balance, strength, coordination back together; be able to return to work    Currently in Pain?  No/denies    Pain Onset  --         [facility] PT Assessment - 01/28/18 0904      Transfers   Sit to Stand  7: Independent;Without upper extremity assist;From chair/3-in-1    Five time sit to stand comments   8.94      Ambulation/Gait   Ambulation/Gait Assistance  7: Independent    Ambulation Distance (Feet)  1200 Feet    Gait Pattern  Within Functional Limits    Ambulation Surface  Level;Unlevel;Outdoor;Paved;Grass moderately steep hill ascend; steep hill descend                   [facility] Adult PT Treatment/Exercise - 01/28/18 0904      Ambulation/Gait   Gait velocity  32.8 ft/8.19 sec =4.00  ft/sec     Berg Balance Test   Sit to Stand  Able to stand without using hands and stabilize independently    Standing Unsupported  Able to stand safely 2 minutes    Sitting with Back Unsupported but Feet Supported on Floor or Stool  Able to sit safely and securely 2 minutes    Stand to Sit  Sits safely with minimal use of hands    Transfers  Able to transfer safely, minor use of hands    Standing Unsupported with Eyes Closed  Able to stand 10 seconds safely    Standing Ubsupported with Feet Together  Able to place feet together independently and stand 1 minute safely    From Standing, Reach Forward with Outstretched Arm  Can reach forward >12 cm safely (5")    From Standing Position, Pick up Object from Floor  Able to pick up shoe safely and easily    From Standing Position, Turn to Look Behind Over each Shoulder  Looks behind from both sides and weight shifts well    Turn 360 Degrees  Able to turn 360 degrees safely in 4 seconds or less    Standing Unsupported, Alternately Place Feet on Step/Stool  Able to stand independently and safely and complete 8 steps in 20 seconds    Standing Unsupported, One Foot in Front  Able to place foot tandem independently and hold 30 seconds    Standing on One Leg  Able to lift leg independently and hold > 10 seconds    Total Score  55      Knee/Hip Exercises: Standing   Other Standing Knee Exercises  lifting counter to 14" stool 30# x 5 x 2; 35# x 5 x2;  with back pain increasing to 5/10 & didn't incr to 40#             PT Education - 01/28/18 1250    Education Details  results of STG assessment; updates to LTGs with plan for return to work in 4 weeks if able    [employer]) Educated  Patient    Methods  Explanation    Comprehension  Verbalized understanding       PT Short Term Goals - 01/28/18 0925      PT SHORT  TERM GOAL #1   Title  Patient will be independent with HEP addressing balance and LE strength (Target date 01/29/18)    Time  4     Period  Weeks    Status  Achieved      PT SHORT TERM GOAL #2   Title  Patient will improve gait velocity to >=2.62 ft/sec to demonstrate increased safety with community level ambulation and lesser fall risk.    Baseline  2.26 ft/sec 4/4 4.00 ft/sec    Time  4    Period  Weeks    Status  Achieved      PT SHORT TERM GOAL #3   Title  Patient will improve Berg Balance score to >=52/56 to show decreased fall risk.    Baseline  4/4 55/56    Time  4    Period  Weeks    Status  Achieved      PT SHORT TERM GOAL #4   Title  Patient will ambulate 1000 ft over paved outdoor terrain with [facility] and modified independent.    Time  4    Period  Weeks    Status  Achieved      PT SHORT TERM GOAL #5   Title  Patient can ascend/descend 12 steps alternating pattern with SPC and supervision (demonstrating improved balance and control on stairs)    Baseline  see 4/2    Time  4    Period  Weeks    Status  Achieved        PT Long Term Goals - 01/28/18 1300      PT LONG TERM GOAL #1   Title  Patient will be independent with updated HEP and able to verbalize a plan for community-based activity upone discharge from PT.     Time  7    Period  Weeks    Status  On-going      PT LONG TERM GOAL #2   Title  Patient will improve gait velocity to >= 3.0 ft/sec to show improved safety in community ambulation (and approaching normal velocity for male 35-59 yo)    Baseline  01/28/18 4.0 ft/sec    Time  7    Period  Weeks    Status  Achieved      PT LONG TERM GOAL #3   Title  Patient will ambulate 1000 ft over outdoor unlevel terrain (ramps, curbs, grass, gravel) with LRAD and no LOB.     Time  7    Period  Weeks    Status  Achieved      PT LONG TERM GOAL #4   Title  Patient will ascend/descend 12 steps alternating pattern with no UE support.     Time  7    Period  Weeks    Status  On-going      PT LONG TERM GOAL #5   Title  Patient will tolerate lifting 50# repeated x 3 and 100# x 1 rep. (simulating  lifting box )    Time  3    Status  New            Plan - 01/28/18 1252    Clinical Impression Statement  STG assessment completed with pt meeting 5 of 5 goals and even meeting some levels set for LTGs. Discussed areas of difficulty for patient--few instances of dizziness, fatigues quickly when consistently standing (cooking pasta dinner for wife he could stand 15 min and then seated rest); lifting tolerance (balance and general  strength). Discussed patient's visit limit per insurance and he wants to continue at 2x/wk for remaining 3 weeks which will leave him with ~11 visits for remainder of the year.     Rehab Potential  Good    Clinical Impairments Affecting Rehab Potential  bil knee pain due to arthritis (left worst); peripheral neuropathy    PT Frequency  2x / week    PT Duration  Other (comment) 7 weeks    PT Treatment/Interventions  ADLs/Self Care Home Management;Aquatic Therapy;Electrical Stimulation;DME Instruction;Gait training;Neuromuscular re-education;Balance training;Therapeutic exercise;Therapeutic activities;Functional mobility training;Stair training;Patient/family education;Orthotic Fit/Training;Passive range of motion    PT Next Visit Plan  work on return to work (including lifting up to 50-100 pounds)--try using weight stack with two handles; Focus on strengthening LEs (per pt, feels his balance is improving).    Consulted and Agree with Plan of Care  Patient       Patient will benefit from skilled therapeutic intervention in order to improve the following deficits and impairments:  Abnormal gait, Decreased balance, Decreased mobility, Decreased knowledge of use of DME, Decreased coordination, Decreased range of motion, Decreased strength, Impaired sensation, Postural dysfunction, Impaired UE functional use, Obesity, Pain  Visit Diagnosis: Muscle weakness (generalized)  Unsteadiness on feet     Problem List Patient Active Problem List   Diagnosis Date Noted  .  Late effect of stroke 12/26/2017  . History of stroke 12/16/2017  . BMI 36.0-36.9,adult 08/05/2017  . Cholelithiasis 08/05/2017  . Bilateral plantar fasciitis 08/05/2017  . Situational mixed anxiety and depressive disorder 08/05/2017  . Bilateral primary osteoarthritis of knee 07/01/2017  . Asthma 01/26/2017  . Overactive bladder 01/21/2017  . BPH with obstruction/lower urinary tract symptoms 01/21/2017  . S/P appendectomy 01/07/2017  . Diverticulosis 11/24/2016  . Type 2 diabetes mellitus without complication, without long-term current use of insulin ([place]) 10/31/2016  . Aortic insufficiency 11/23/2012  . Chronic diastolic CHF (congestive heart failure) ([place]) 11/23/2012  . CAD (coronary artery disease) 11/27/2011  . OSA (obstructive sleep apnea) 11/11/2011  . Aortic root dilatation ([place])   . Hyperlipidemia 01/23/2011  . Hypertension 01/23/2011    Jeanie Cooks Antonius Hartlage. PT 01/28/2018, 1:33 PM  [facility] [address] [place], [place], [address] Phone: [phone]   Fax:  [phone]  Name: Marvelous Bouwens MRN: [id number] Date of Birth: January 03, 1961

## 2018-01-28 NOTE — Telephone Encounter (Signed)
BCBS Auth: [id number] (exp. 01/28/18 to 02/26/18) EE LVM for pt to call back about scheduling mri EE

## 2018-01-29 NOTE — Telephone Encounter (Signed)
Attending physician completed fax twice to [facility] at [phone]. Form fax twice and confirmed.Venancio Poisson NP completed the physical assessment portion for patients job. Dr.Sethi signed form and Janett Billow NP also signed form. Janett Billow NP office notes,and medication list, release form fax to number above. Form given to medical records.

## 2018-01-31 NOTE — Progress Notes (Signed)
I agree with the above plan 

## 2018-02-01 ENCOUNTER — Encounter: Payer: Self-pay | Admitting: Physician Assistant

## 2018-02-02 ENCOUNTER — Ambulatory Visit: Payer: BLUE CROSS/BLUE SHIELD | Admitting: Physical Therapy

## 2018-02-02 ENCOUNTER — Encounter: Payer: Self-pay | Admitting: Physical Therapy

## 2018-02-02 ENCOUNTER — Encounter: Payer: BLUE CROSS/BLUE SHIELD | Admitting: Occupational Therapy

## 2018-02-02 DIAGNOSIS — M6281 Muscle weakness (generalized): Secondary | ICD-10-CM

## 2018-02-02 DIAGNOSIS — R2681 Unsteadiness on feet: Secondary | ICD-10-CM

## 2018-02-02 NOTE — Therapy (Signed)
[facility] [address] [place], [place], [address] Phone: [phone]   Fax:  [phone]  Physical Therapy Treatment  Patient Details  Name: Jonathan [facility] MRN: [id number] Date of Birth: February 13, 1961 Referring Provider: Antony Contras   Encounter Date: 02/02/2018  PT End of Session - 02/02/18 1932    Visit Number  10    Number of Visits  15    Date for PT Re-Evaluation  02/19/18    Authorization Type  BCBS; PT and OT combined limit 30 visits    Authorization - Visit Number  14 includes 4 OT visits    Authorization - Number of Visits  30 PT/OT combined 30 VL    PT Start Time  0805    PT Stop Time  0844    PT Time Calculation (min)  39 min    Activity Tolerance  Patient tolerated treatment well    Behavior During Therapy  Jonathan Miles for tasks assessed/performed       Past Medical History:  Diagnosis Date  . Alcohol abuse   . Aortic root dilatation (HCC)    a. echo 4/12: EF 55-60%, mod to marked dilated ascending aortic root;   b. MRA 4/12: Aortic root 5.5 cm, dilation of left and right coronary cusps, trileaflet aortic valve // Echo 2/19: Moderate LVH, EF 55-60, normal wall motion, grade 1 diastolic dysfunction, mild AI, mildly dilated aortic root, moderate to severe LAE (aortic root 40 mm; ascending aorta 39 mm)   . Arthritis   . Asthma    ast attack in early 20's  . CAD (coronary artery disease)   . Chest pain    Myoview 4/12: EF 62%, no ischemia or scar  . CHF (congestive heart failure) ([place])   . Chronic pain    back and hands  . Constipation   . COPD (chronic obstructive pulmonary disease) ([place])   . Depression   . Diabetes mellitus    type 2  . Difficulty speaking   . Drug use   . Enlarged prostate   . Fatigue   . GERD (gastroesophageal reflux disease)    modifies with diet  . Hx of echocardiogram    Echo (2/16):  Mod LVH, EF 55-60%, mild to mod AI, mod LAE, mild RAE, Aortic root 38 mm, Asc aorta 43 mm  .  Hyperlipidemia   . Hypertension   . Joint pain   . Knee pain, bilateral   . Leg pain    ABIs 3/19: Normal (R 1.3; L 1.28)  . OSA (obstructive sleep apnea) 2006   .  Wears at times  . Osteoarthritis   . Palpitations   . Stroke [facility])     Past Surgical History:  Procedure Laterality Date  . APPENDECTOMY  12/2016  . Colonscopy    . CORONARY ARTERY BYPASS GRAFT  05/07/2012   Procedure: CORONARY ARTERY BYPASS GRAFTING (CABG);  Surgeon: Grace Isaac, MD;  Location: [facility];  Service: Open Heart Surgery;  Laterality: N/A;  . KNEE ARTHROSCOPY Left 2010   Dr. Rosamaria Lints  . LAPAROSCOPIC APPENDECTOMY N/A 01/06/2017   Procedure: APPENDECTOMY LAPAROSCOPIC;  Surgeon: Ralene Ok, MD;  Location: [facility];  Service: General;  Laterality: N/A;  . Retactment of fingers     as a child , sewed with sewing machine- Index and middle finger  . UPPER GASTROINTESTINAL ENDOSCOPY      There were no vitals filed for this visit.  Subjective Assessment - 02/02/18 0807    Subjective  Had an episode of extreme ftigue after sorting through papers for awhile. Slept wrong and mid-upper back feels like he pulled a muscle.     Patient is accompained by:  Family member    Pertinent History  CAD, CHF, HTN, COPD, DM, bil plant fasc, bil knee OA,     Patient Stated Goals  I've got to get my balance, strength, coordination back together; be able to return to work    Currently in Pain?  Yes    Pain Score  3     Pain Location  Back    Pain Orientation  Mid;Upper    Pain Descriptors / Indicators  Aching    Pain Type  Acute pain    Pain Onset  In the past 7 days    Pain Frequency  Intermittent    Aggravating Factors   reaching                       [facility] Adult PT Treatment/Exercise - 02/02/18 0811      Lumbar Exercises: Aerobic   Other Aerobic Exercise  Sci-Fit L2.5 x 7 min      Lumbar Exercises: Supine   Other Supine Lumbar Exercises  large ball; alternating shoulder flexion x 10 each; with 4  lb ball; D1, D2 PNF patterns bil x 10       Knee/Hip Exercises: Standing   Other Standing Knee Exercises  weight stack (handles plus ankle strap to lengthen to lowest pulley)waist to chest height 45# x 10 reps x 2; knee height (handles plus chain) 40# x 10 x 2; 50# x 10 x 2          Balance Exercises - 02/02/18 0832      Balance Exercises: Standing   Balance Beam  blue beam feet together EO head turns, step off/on posteriorly; EC x 30 seconds    Other Standing Exercises  up/down ladder to 3rd step x 3; at top releasing with one hand          PT Short Term Goals - 01/28/18 0925      PT SHORT TERM GOAL #1   Title  Patient will be independent with HEP addressing balance and LE strength (Target date 01/29/18)    Time  4    Period  Weeks    Status  Achieved      PT SHORT TERM GOAL #2   Title  Patient will improve gait velocity to >=2.62 ft/sec to demonstrate increased safety with community level ambulation and lesser fall risk.    Baseline  2.26 ft/sec 4/4 4.00 ft/sec    Time  4    Period  Weeks    Status  Achieved      PT SHORT TERM GOAL #3   Title  Patient will improve Berg Balance score to >=52/56 to show decreased fall risk.    Baseline  4/4 55/56    Time  4    Period  Weeks    Status  Achieved      PT SHORT TERM GOAL #4   Title  Patient will ambulate 1000 ft over paved outdoor terrain with [facility] and modified independent.    Time  4    Period  Weeks    Status  Achieved      PT SHORT TERM GOAL #5   Title  Patient can ascend/descend 12 steps alternating pattern with SPC and supervision (demonstrating improved balance and control on stairs)  Baseline  see 4/2    Time  4    Period  Weeks    Status  Achieved        PT Long Term Goals - 02/02/18 1945      PT LONG TERM GOAL #1   Title  Patient will be independent with updated HEP and able to verbalize a plan for community-based activity upone discharge from PT.  (Target all LTGs 02/19/18)    Time  7    Period   Weeks    Status  On-going      PT LONG TERM GOAL #2   Title  Patient will improve gait velocity to >= 3.0 ft/sec to show improved safety in community ambulation (and approaching normal velocity for male 59-59 yo)    Baseline  01/28/18 4.0 ft/sec    Time  7    Period  Weeks    Status  Achieved      PT LONG TERM GOAL #3   Title  Patient will ambulate 1000 ft over outdoor unlevel terrain (ramps, curbs, grass, gravel) with LRAD and no LOB.     Time  7    Period  Weeks    Status  Achieved      PT LONG TERM GOAL #4   Title  Patient will ascend/descend 12 steps alternating pattern with no UE support.     Time  7    Period  Weeks    Status  On-going      PT LONG TERM GOAL #5   Title  Patient will tolerate lifting 50# repeated x 3 and 100# x 1 rep. (simulating lifting box )    Time  3    Status  New            Plan - 02/02/18 1935    Clinical Impression Statement  Due to pt's mid-back pain on arrival, utilzed seated aerobic exercise for warm-up (instead of planned ambulation on treadmill). Patient with reported decr in back pain after warm-up and by end of session reported back pain was gone. Continue to focus on strengthening and lifting activities for return to work. Pt able to climb up to 3rd step of ladder without imbalance or dizziness. Patient did well with balance training with very few LOB and always able to self-correct without assist. Patient continues to make progress towards LTGs.     Rehab Potential  Good    Clinical Impairments Affecting Rehab Potential  bil knee pain due to arthritis (left worst); peripheral neuropathy    PT Frequency  2x / week    PT Duration  Other (comment) 7 weeks    PT Treatment/Interventions  ADLs/Self Care Home Management;Aquatic Therapy;Electrical Stimulation;DME Instruction;Gait training;Neuromuscular re-education;Balance training;Therapeutic exercise;Therapeutic activities;Functional mobility training;Stair training;Patient/family  education;Orthotic Fit/Training;Passive range of motion    PT Next Visit Plan  work on return to work (including lifting up to 50-100 pounds)--try using weight stack with two handles; Focus on strengthening LEs (including leg press); increase ambulation time for return to work (treadmill)    [employer] and Agree with Plan of Care  Patient       Patient will benefit from skilled therapeutic intervention in order to improve the following deficits and impairments:  Abnormal gait, Decreased balance, Decreased mobility, Decreased knowledge of use of DME, Decreased coordination, Decreased range of motion, Decreased strength, Impaired sensation, Postural dysfunction, Impaired UE functional use, Obesity, Pain  Visit Diagnosis: Muscle weakness (generalized)  Unsteadiness on feet     Problem List  Patient Active Problem List   Diagnosis Date Noted  . Late effect of stroke 12/26/2017  . History of stroke 12/16/2017  . BMI 36.0-36.9,adult 08/05/2017  . Cholelithiasis 08/05/2017  . Bilateral plantar fasciitis 08/05/2017  . Situational mixed anxiety and depressive disorder 08/05/2017  . Bilateral primary osteoarthritis of knee 07/01/2017  . Asthma 01/26/2017  . Overactive bladder 01/21/2017  . BPH with obstruction/lower urinary tract symptoms 01/21/2017  . S/P appendectomy 01/07/2017  . Diverticulosis 11/24/2016  . Type 2 diabetes mellitus without complication, without long-term current use of insulin ([place]) 10/31/2016  . Aortic insufficiency 11/23/2012  . Chronic diastolic CHF (congestive heart failure) ([place]) 11/23/2012  . CAD (coronary artery disease) 11/27/2011  . OSA (obstructive sleep apnea) 11/11/2011  . Aortic root dilatation ([place])   . Hyperlipidemia 01/23/2011  . Hypertension 01/23/2011    Rexanne Mano, PT 02/02/2018, 7:45 PM  [facility] [address] [place], [place], [address] Phone: [phone]   Fax:   [phone]  Name: Cailean Heacock MRN: [id number] Date of Birth: 11-Feb-1961

## 2018-02-03 ENCOUNTER — Other Ambulatory Visit: Payer: BLUE CROSS/BLUE SHIELD

## 2018-02-03 ENCOUNTER — Ambulatory Visit (INDEPENDENT_AMBULATORY_CARE_PROVIDER_SITE_OTHER): Payer: BLUE CROSS/BLUE SHIELD

## 2018-02-03 ENCOUNTER — Ambulatory Visit: Payer: BLUE CROSS/BLUE SHIELD | Admitting: Adult Health

## 2018-02-03 DIAGNOSIS — I6350 Cerebral infarction due to unspecified occlusion or stenosis of unspecified cerebral artery: Secondary | ICD-10-CM

## 2018-02-03 MED ORDER — GADOPENTETATE DIMEGLUMINE 469.01 MG/ML IV SOLN
20.0000 mL | Freq: Once | INTRAVENOUS | Status: AC | PRN
  Administered 2018-02-03: 20 mL via INTRAVENOUS

## 2018-02-04 ENCOUNTER — Encounter: Payer: BLUE CROSS/BLUE SHIELD | Admitting: Occupational Therapy

## 2018-02-04 ENCOUNTER — Encounter: Payer: Self-pay | Admitting: Physical Therapy

## 2018-02-04 ENCOUNTER — Ambulatory Visit: Payer: BLUE CROSS/BLUE SHIELD | Admitting: Physical Therapy

## 2018-02-04 DIAGNOSIS — M6281 Muscle weakness (generalized): Secondary | ICD-10-CM

## 2018-02-04 NOTE — Therapy (Signed)
[facility] [address] [place], [place], [address] Phone: [phone]   Fax:  [phone]  Physical Therapy Treatment  Patient Details  Name: Jonathan Miles MRN: [id number] Date of Birth: [birth year] Referring Provider: Antony Contras   Encounter Date: 02/04/2018  PT End of Session - 02/04/18 0859    Visit Number  11    Number of Visits  15    Date for PT Re-Evaluation  02/19/18    Authorization Type  [employer]; PT and OT combined limit 30 visits    Authorization - Visit Number  15 includes 4 OT visits    Authorization - Number of Visits  30 PT/OT combined 30 VL    PT Start Time  0932 late arrival    PT Stop Time  0930    PT Time Calculation (min)  40 min    Activity Tolerance  Patient tolerated treatment well    Behavior During Therapy  [facility] for tasks assessed/performed       Past Medical History:  Diagnosis Date  . Alcohol abuse   . Aortic root dilatation (HCC)    a. echo 4/12: EF 55-60%, mod to marked dilated ascending aortic root;   b. MRA 4/12: Aortic root 5.5 cm, dilation of left and right coronary cusps, trileaflet aortic valve // Echo 2/19: Moderate LVH, EF 55-60, normal wall motion, grade 1 diastolic dysfunction, mild AI, mildly dilated aortic root, moderate to severe LAE (aortic root 40 mm; ascending aorta 39 mm)   . Arthritis   . Asthma    ast attack in early 20's  . CAD (coronary artery disease)   . Chest pain    Myoview 4/12: EF 62%, no ischemia or scar  . CHF (congestive heart failure) ([place])   . Chronic pain    back and hands  . Constipation   . COPD (chronic obstructive pulmonary disease) ([place])   . Depression   . Diabetes mellitus    type 2  . Difficulty speaking   . Drug use   . Enlarged prostate   . Fatigue   . GERD (gastroesophageal reflux disease)    modifies with diet  . Hx of echocardiogram    Echo (2/16):  Mod LVH, EF 55-60%, mild to mod AI, mod LAE, mild RAE, Aortic root 38 mm, Asc  aorta 43 mm  . Hyperlipidemia   . Hypertension   . Joint pain   . Knee pain, bilateral   . Leg pain    ABIs 3/19: Normal (R 1.3; L 1.28)  . OSA (obstructive sleep apnea) 2006   .  Wears at times  . Osteoarthritis   . Palpitations   . Stroke [facility])     Past Surgical History:  Procedure Laterality Date  . APPENDECTOMY  12/2016  . Colonscopy    . CORONARY ARTERY BYPASS GRAFT  05/07/2012   Procedure: CORONARY ARTERY BYPASS GRAFTING (CABG);  Surgeon: Grace Isaac, MD;  Location: [place];  Service: Open Heart Surgery;  Laterality: N/A;  . KNEE ARTHROSCOPY Left 2010   Dr. Rosamaria Lints  . LAPAROSCOPIC APPENDECTOMY N/A 01/06/2017   Procedure: APPENDECTOMY LAPAROSCOPIC;  Surgeon: Ralene Ok, MD;  Location: [place];  Service: General;  Laterality: N/A;  . Retactment of fingers     as a child , sewed with sewing machine- Index and middle finger  . UPPER GASTROINTESTINAL ENDOSCOPY      There were no vitals filed for this visit.  Subjective Assessment - 02/04/18 0850  Subjective  They did my MRA yesterday. I also bowled both Tues and Wed night and I'm pretty tired.     Patient is accompained by:  Family member    Pertinent History  CAD, CHF, HTN, COPD, DM, bil plant fasc, bil knee OA,     Patient Stated Goals  I've got to get my balance, strength, coordination back together; be able to return to work    Currently in Pain?  Yes    Pain Score  2     Pain Location  Back    Pain Orientation  Lower    Pain Descriptors / Indicators  Sore    Pain Type  Chronic pain    Pain Onset  More than a month ago    Pain Frequency  Intermittent                       [facility] Adult PT Treatment/Exercise - 02/04/18 0918      Knee/Hip Exercises: Machines for Strengthening   Cybex Leg Press  100# x 10 reps; 130# x 2 reps (knee pain)    Total Gym Leg Press  100# x10; 130# x 10;                PT Short Term Goals - 01/28/18 8841      PT SHORT TERM GOAL #1   Title  Patient will  be independent with HEP addressing balance and LE strength (Target date 01/29/18)    Time  4    Period  Weeks    Status  Achieved      PT SHORT TERM GOAL #2   Title  Patient will improve gait velocity to >=2.62 ft/sec to demonstrate increased safety with community level ambulation and lesser fall risk.    Baseline  2.26 ft/sec 4/4 4.00 ft/sec    Time  4    Period  Weeks    Status  Achieved      PT SHORT TERM GOAL #3   Title  Patient will improve Berg Balance score to >=52/56 to show decreased fall risk.    Baseline  4/4 55/56    Time  4    Period  Weeks    Status  Achieved      PT SHORT TERM GOAL #4   Title  Patient will ambulate 1000 ft over paved outdoor terrain with [facility] and modified independent.    Time  4    Period  Weeks    Status  Achieved      PT SHORT TERM GOAL #5   Title  Patient can ascend/descend 12 steps alternating pattern with SPC and supervision (demonstrating improved balance and control on stairs)    Baseline  see 4/2    Time  4    Period  Weeks    Status  Achieved        PT Long Term Goals - 02/02/18 1945      PT LONG TERM GOAL #1   Title  Patient will be independent with updated HEP and able to verbalize a plan for community-based activity upone discharge from PT.  (Target all LTGs 02/19/18)    Time  7    Period  Weeks    Status  On-going      PT LONG TERM GOAL #2   Title  Patient will improve gait velocity to >= 3.0 ft/sec to show improved safety in community ambulation (and approaching normal velocity for male 3-59 yo)  Baseline  01/28/18 4.0 ft/sec    Time  7    Period  Weeks    Status  Achieved      PT LONG TERM GOAL #3   Title  Patient will ambulate 1000 ft over outdoor unlevel terrain (ramps, curbs, grass, gravel) with LRAD and no LOB.     Time  7    Period  Weeks    Status  Achieved      PT LONG TERM GOAL #4   Title  Patient will ascend/descend 12 steps alternating pattern with no UE support.     Time  7    Period  Weeks    Status   On-going      PT LONG TERM GOAL #5   Title  Patient will tolerate lifting 50# repeated x 3 and 100# x 1 rep. (simulating lifting box )    Time  3    Status  New            Plan - 02/04/18 1303    Clinical Impression Statement  Continue to progress patient's level of activity with return to work as the goal. Patient reports he is fatigued after sessions, however is not experiencing incr pain back or knees (more than usual) after PT. "Actually I always feel better)    Rehab Potential  Good    Clinical Impairments Affecting Rehab Potential  bil knee pain due to arthritis (left worst); peripheral neuropathy    PT Frequency  2x / week    PT Duration  Other (comment) 7 weeks    PT Treatment/Interventions  ADLs/Self Care Home Management;Aquatic Therapy;Electrical Stimulation;DME Instruction;Gait training;Neuromuscular re-education;Balance training;Therapeutic exercise;Therapeutic activities;Functional mobility training;Stair training;Patient/family education;Orthotic Fit/Training;Passive range of motion    PT Next Visit Plan  work on return to work (including lifting up to 50-100 pounds)--try using weight stack with two handles; Focus on strengthening LEs (including leg press); increase ambulation time for return to work (treadmill)    [employer] and Agree with Plan of Care  Patient       Patient will benefit from skilled therapeutic intervention in order to improve the following deficits and impairments:  Abnormal gait, Decreased balance, Decreased mobility, Decreased knowledge of use of DME, Decreased coordination, Decreased range of motion, Decreased strength, Impaired sensation, Postural dysfunction, Impaired UE functional use, Obesity, Pain  Visit Diagnosis: Muscle weakness (generalized)     Problem List Patient Active Problem List   Diagnosis Date Noted  . Late effect of stroke 12/26/2017  . History of stroke 12/16/2017  . BMI 36.0-36.9,adult 08/05/2017  . Cholelithiasis  08/05/2017  . Bilateral plantar fasciitis 08/05/2017  . Situational mixed anxiety and depressive disorder 08/05/2017  . Bilateral primary osteoarthritis of knee 07/01/2017  . Asthma 01/26/2017  . Overactive bladder 01/21/2017  . BPH with obstruction/lower urinary tract symptoms 01/21/2017  . S/P appendectomy 01/07/2017  . Diverticulosis 11/24/2016  . Type 2 diabetes mellitus without complication, without long-term current use of insulin ([place]) 10/31/2016  . Aortic insufficiency 11/23/2012  . Chronic diastolic CHF (congestive heart failure) ([place]) 11/23/2012  . CAD (coronary artery disease) 11/27/2011  . OSA (obstructive sleep apnea) 11/11/2011  . Aortic root dilatation ([place])   . Hyperlipidemia 01/23/2011  . Hypertension 01/23/2011    Rexanne Mano, PT 02/04/2018, 1:07 PM  [facility] [address] [place], [place], [address] Phone: [phone]   Fax:  [phone]  Name: Jonathan Miles MRN: [id number] Date of Birth: [birth year]

## 2018-02-08 ENCOUNTER — Telehealth: Payer: Self-pay | Admitting: Neurology

## 2018-02-08 NOTE — Telephone Encounter (Signed)
Called the patient and went over the MRA results. Pt verbalized understanding and had no questions or concerns.

## 2018-02-08 NOTE — Telephone Encounter (Signed)
-----   Message from Darleen Crocker, RN sent at 02/08/2018 11:29 AM EDT -----   ----- Message ----- From: Venancio Poisson, NP Sent: 02/08/2018   8:00 AM To: Marval Regal, RN  Please notify patient that his MRA of head and neck looked good without vessel occlusion or narrowing.Thank you

## 2018-02-09 ENCOUNTER — Encounter: Payer: BLUE CROSS/BLUE SHIELD | Admitting: Occupational Therapy

## 2018-02-09 ENCOUNTER — Ambulatory Visit: Payer: BLUE CROSS/BLUE SHIELD | Admitting: Physical Therapy

## 2018-02-09 ENCOUNTER — Encounter: Payer: Self-pay | Admitting: Physical Therapy

## 2018-02-09 DIAGNOSIS — M6281 Muscle weakness (generalized): Secondary | ICD-10-CM | POA: Diagnosis not present

## 2018-02-09 DIAGNOSIS — I69351 Hemiplegia and hemiparesis following cerebral infarction affecting right dominant side: Secondary | ICD-10-CM

## 2018-02-09 NOTE — Therapy (Signed)
[facility] [address] [place], [place], [address] Phone: [phone]   Fax:  [phone]  Physical Therapy Treatment  Patient Details  Name: Jonathan Miles MRN: [id number] Date of Birth: [birth year] Referring Provider: Antony Contras   Encounter Date: 02/09/2018  PT End of Session - 02/09/18 0819    Visit Number  12    Number of Visits  15    Date for PT Re-Evaluation  02/19/18    Authorization Type  [employer]; PT and OT combined limit 30 visits    Authorization - Visit Number  56 includes 4 OT visits    Authorization - Number of Visits  30 PT/OT combined 30 VL    PT Start Time  0800    PT Stop Time  0840    PT Time Calculation (min)  40 min    Activity Tolerance  Patient tolerated treatment well    Behavior During Therapy  [facility] for tasks assessed/performed       Past Medical History:  Diagnosis Date  . Alcohol abuse   . Aortic root dilatation (HCC)    a. echo 4/12: EF 55-60%, mod to marked dilated ascending aortic root;   b. MRA 4/12: Aortic root 5.5 cm, dilation of left and right coronary cusps, trileaflet aortic valve // Echo 2/19: Moderate LVH, EF 55-60, normal wall motion, grade 1 diastolic dysfunction, mild AI, mildly dilated aortic root, moderate to severe LAE (aortic root 40 mm; ascending aorta 39 mm)   . Arthritis   . Asthma    ast attack in early 20's  . CAD (coronary artery disease)   . Chest pain    Myoview 4/12: EF 62%, no ischemia or scar  . CHF (congestive heart failure) ([place])   . Chronic pain    back and hands  . Constipation   . COPD (chronic obstructive pulmonary disease) ([place])   . Depression   . Diabetes mellitus    type 2  . Difficulty speaking   . Drug use   . Enlarged prostate   . Fatigue   . GERD (gastroesophageal reflux disease)    modifies with diet  . Hx of echocardiogram    Echo (2/16):  Mod LVH, EF 55-60%, mild to mod AI, mod LAE, mild RAE, Aortic root 38 mm, Asc aorta 43 mm  .  Hyperlipidemia   . Hypertension   . Joint pain   . Knee pain, bilateral   . Leg pain    ABIs 3/19: Normal (R 1.3; L 1.28)  . OSA (obstructive sleep apnea) 2006   .  Wears at times  . Osteoarthritis   . Palpitations   . Stroke [facility])     Past Surgical History:  Procedure Laterality Date  . APPENDECTOMY  12/2016  . Colonscopy    . CORONARY ARTERY BYPASS GRAFT  05/07/2012   Procedure: CORONARY ARTERY BYPASS GRAFTING (CABG);  Surgeon: Grace Isaac, MD;  Location: [place];  Service: Open Heart Surgery;  Laterality: N/A;  . KNEE ARTHROSCOPY Left 2010   Dr. Rosamaria Lints  . LAPAROSCOPIC APPENDECTOMY N/A 01/06/2017   Procedure: APPENDECTOMY LAPAROSCOPIC;  Surgeon: Ralene Ok, MD;  Location: [place];  Service: General;  Laterality: N/A;  . Retactment of fingers     as a child , sewed with sewing machine- Index and middle finger  . UPPER GASTROINTESTINAL ENDOSCOPY      There were no vitals filed for this visit.  Subjective Assessment - 02/09/18 0801    Subjective  Frustrated because short-term disability extension hasn't gone thru and isn't getting checks currently. Unable to join gym due to this. Utilities, etc are due.     Patient is accompained by:  Family member    Pertinent History  CAD, CHF, HTN, COPD, DM, bil plant fasc, bil knee OA,     Patient Stated Goals  I've got to get my balance, strength, coordination back together; be able to return to work    Currently in Pain?  Yes    Pain Score  4     Pain Location  Shoulder    Pain Orientation  Right    Pain Descriptors / Indicators  Sore    Pain Type  Acute pain thinks he slept on his shoulder wrong    Pain Onset  Today slept on it wrong    Pain Frequency  Intermittent                       [facility] Adult PT Treatment/Exercise - 02/09/18 0806      Lumbar Exercises: Aerobic   Tread Mill  warm-up 2.0-2.2 mph x 2 min; 2.4 x 8 min      Lumbar Exercises: Seated   Other Seated Lumbar Exercises  lat pull down 50# x 10       Knee/Hip Exercises: Aerobic   Elliptical  2 min fwd 40+ RPM; rest 45 seconds; 1 min backwards (done at end of session)      Knee/Hip Exercises: Machines for Strengthening   Total Gym Leg Press  110# x 15; 130# x 10    Other Machine  working PF bil on leg press; 100# x 10 knees bent and knees straight      Knee/Hip Exercises: Standing   Other Standing Knee Exercises  weight stack knee to chest height lift 40# x 10; 45# x 10; 50# x 10             PT Education - 02/09/18 1947    Education Details  addition to HEP    Person(s) Educated  Patient    Methods  Explanation;Demonstration;Verbal cues;Handout    Comprehension  Verbalized understanding;Returned demonstration       PT Short Term Goals - 01/28/18 0925      PT SHORT TERM GOAL #1   Title  Patient will be independent with HEP addressing balance and LE strength (Target date 01/29/18)    Time  4    Period  Weeks    Status  Achieved      PT SHORT TERM GOAL #2   Title  Patient will improve gait velocity to >=2.62 ft/sec to demonstrate increased safety with community level ambulation and lesser fall risk.    Baseline  2.26 ft/sec 4/4 4.00 ft/sec    Time  4    Period  Weeks    Status  Achieved      PT SHORT TERM GOAL #3   Title  Patient will improve Berg Balance score to >=52/56 to show decreased fall risk.    Baseline  4/4 55/56    Time  4    Period  Weeks    Status  Achieved      PT SHORT TERM GOAL #4   Title  Patient will ambulate 1000 ft over paved outdoor terrain with [facility] and modified independent.    Time  4    Period  Weeks    Status  Achieved      PT SHORT TERM GOAL #  5   Title  Patient can ascend/descend 12 steps alternating pattern with SPC and supervision (demonstrating improved balance and control on stairs)    Baseline  see 4/2    Time  4    Period  Weeks    Status  Achieved        PT Long Term Goals - 02/02/18 1945      PT LONG TERM GOAL #1   Title  Patient will be independent with updated  HEP and able to verbalize a plan for community-based activity upone discharge from PT.  (Target all LTGs 02/19/18)    Time  7    Period  Weeks    Status  On-going      PT LONG TERM GOAL #2   Title  Patient will improve gait velocity to >= 3.0 ft/sec to show improved safety in community ambulation (and approaching normal velocity for male 21-59 yo)    Baseline  01/28/18 4.0 ft/sec    Time  7    Period  Weeks    Status  Achieved      PT LONG TERM GOAL #3   Title  Patient will ambulate 1000 ft over outdoor unlevel terrain (ramps, curbs, grass, gravel) with LRAD and no LOB.     Time  7    Period  Weeks    Status  Achieved      PT LONG TERM GOAL #4   Title  Patient will ascend/descend 12 steps alternating pattern with no UE support.     Time  7    Period  Weeks    Status  On-going      PT LONG TERM GOAL #5   Title  Patient will tolerate lifting 50# repeated x 3 and 100# x 1 rep. (simulating lifting box )    Time  3    Status  New            Plan - 02/09/18 0832    Clinical Impression Statement  Continue to progress activity level with return to work (?beginning of May) as the goal. Patient has steadily increased the amount of weight he can lift and time/velocity while walking. Continue PT to achieve LTGs    Rehab Potential  Good    Clinical Impairments Affecting Rehab Potential  bil knee pain due to arthritis (left worst); peripheral neuropathy    PT Frequency  2x / week    PT Duration  Other (comment) 7 weeks    PT Treatment/Interventions  ADLs/Self Care Home Management;Aquatic Therapy;Electrical Stimulation;DME Instruction;Gait training;Neuromuscular re-education;Balance training;Therapeutic exercise;Therapeutic activities;Functional mobility training;Stair training;Patient/family education;Orthotic Fit/Training;Passive range of motion    PT Next Visit Plan  work on return to work (including lifting up to 50-100 pounds)--using weight stack with two handles; Focus on strengthening  LEs (including leg press); increase ambulation time for return to work (treadmill); elliptical    Consulted and Agree with Plan of Care  Patient       Patient will benefit from skilled therapeutic intervention in order to improve the following deficits and impairments:  Abnormal gait, Decreased balance, Decreased mobility, Decreased knowledge of use of DME, Decreased coordination, Decreased range of motion, Decreased strength, Impaired sensation, Postural dysfunction, Impaired UE functional use, Obesity, Pain  Visit Diagnosis: Muscle weakness (generalized)  Hemiplegia and hemiparesis following cerebral infarction affecting right dominant side [facility])     Problem List Patient Active Problem List   Diagnosis Date Noted  . Late effect of stroke 12/26/2017  . History of  stroke 12/16/2017  . BMI 36.0-36.9,adult 08/05/2017  . Cholelithiasis 08/05/2017  . Bilateral plantar fasciitis 08/05/2017  . Situational mixed anxiety and depressive disorder 08/05/2017  . Bilateral primary osteoarthritis of knee 07/01/2017  . Asthma 01/26/2017  . Overactive bladder 01/21/2017  . BPH with obstruction/lower urinary tract symptoms 01/21/2017  . S/P appendectomy 01/07/2017  . Diverticulosis 11/24/2016  . Type 2 diabetes mellitus without complication, without long-term current use of insulin ([place]) 10/31/2016  . Aortic insufficiency 11/23/2012  . Chronic diastolic CHF (congestive heart failure) ([place]) 11/23/2012  . CAD (coronary artery disease) 11/27/2011  . OSA (obstructive sleep apnea) 11/11/2011  . Aortic root dilatation ([place])   . Hyperlipidemia 01/23/2011  . Hypertension 01/23/2011    Rexanne Mano, PT 02/09/2018, 7:50 PM  [facility] [address] [place], [place], [address] Phone: [phone]   Fax:  [phone]  Name: Izic Stfort MRN: [id number] Date of Birth: 30-Oct-1960

## 2018-02-09 NOTE — Patient Instructions (Signed)
Single Leg - Eyes Open    Only one finger support, lift right leg while maintaining balance over other leg. Hold for 10 seconds and then lift finger and hold as long as possible.  Repeat __3__ times per session on each leg. Do __1-2__ sessions per day.  Copyright  VHI. All rights reserved.

## 2018-02-11 ENCOUNTER — Encounter: Payer: BLUE CROSS/BLUE SHIELD | Admitting: Occupational Therapy

## 2018-02-11 ENCOUNTER — Encounter: Payer: Self-pay | Admitting: Physical Therapy

## 2018-02-11 ENCOUNTER — Ambulatory Visit: Payer: BLUE CROSS/BLUE SHIELD | Admitting: Physical Therapy

## 2018-02-11 ENCOUNTER — Encounter: Payer: Self-pay | Admitting: Adult Health

## 2018-02-11 DIAGNOSIS — M6281 Muscle weakness (generalized): Secondary | ICD-10-CM | POA: Diagnosis not present

## 2018-02-11 DIAGNOSIS — I69351 Hemiplegia and hemiparesis following cerebral infarction affecting right dominant side: Secondary | ICD-10-CM

## 2018-02-11 NOTE — Therapy (Signed)
[facility] [address] [place], [place], [address] Phone: [phone]   Fax:  [phone]  Physical Therapy Treatment  Patient Details  Name: Jonathan Miles MRN: [id number] Date of Birth: 1961/05/16 Referring Provider: Antony Contras   Encounter Date: 02/11/2018  PT End of Session - 02/11/18 0809    Visit Number  13    Number of Visits  15    Date for PT Re-Evaluation  02/19/18    Authorization Type  [employer]; PT and OT combined limit 30 visits    Authorization - Visit Number  85 includes 4 OT visits    Authorization - Number of Visits  30 PT/OT combined 30 VL    PT Start Time  0804    PT Stop Time  4196    PT Time Calculation (min)  40 min    Activity Tolerance  Patient tolerated treatment well    Behavior During Therapy  [facility] for tasks assessed/performed       Past Medical History:  Diagnosis Date  . Alcohol abuse   . Aortic root dilatation (HCC)    a. echo 4/12: EF 55-60%, mod to marked dilated ascending aortic root;   b. MRA 4/12: Aortic root 5.5 cm, dilation of left and right coronary cusps, trileaflet aortic valve // Echo 2/19: Moderate LVH, EF 55-60, normal wall motion, grade 1 diastolic dysfunction, mild AI, mildly dilated aortic root, moderate to severe LAE (aortic root 40 mm; ascending aorta 39 mm)   . Arthritis   . Asthma    ast attack in early 20's  . CAD (coronary artery disease)   . Chest pain    Myoview 4/12: EF 62%, no ischemia or scar  . CHF (congestive heart failure) ([place])   . Chronic pain    back and hands  . Constipation   . COPD (chronic obstructive pulmonary disease) ([place])   . Depression   . Diabetes mellitus    type 2  . Difficulty speaking   . Drug use   . Enlarged prostate   . Fatigue   . GERD (gastroesophageal reflux disease)    modifies with diet  . Hx of echocardiogram    Echo (2/16):  Mod LVH, EF 55-60%, mild to mod AI, mod LAE, mild RAE, Aortic root 38 mm, Asc aorta 43 mm  .  Hyperlipidemia   . Hypertension   . Joint pain   . Knee pain, bilateral   . Leg pain    ABIs 3/19: Normal (R 1.3; L 1.28)  . OSA (obstructive sleep apnea) 2006   .  Wears at times  . Osteoarthritis   . Palpitations   . Stroke [facility])     Past Surgical History:  Procedure Laterality Date  . APPENDECTOMY  12/2016  . Colonscopy    . CORONARY ARTERY BYPASS GRAFT  05/07/2012   Procedure: CORONARY ARTERY BYPASS GRAFTING (CABG);  Surgeon: Grace Isaac, MD;  Location: [facility];  Service: Open Heart Surgery;  Laterality: N/A;  . KNEE ARTHROSCOPY Left 2010   Dr. Rosamaria Lints  . LAPAROSCOPIC APPENDECTOMY N/A 01/06/2017   Procedure: APPENDECTOMY LAPAROSCOPIC;  Surgeon: Ralene Ok, MD;  Location: [facility];  Service: General;  Laterality: N/A;  . Retactment of fingers     as a child , sewed with sewing machine- Index and middle finger  . UPPER GASTROINTESTINAL ENDOSCOPY      There were no vitals filed for this visit.  Subjective Assessment - 02/11/18 0807    Subjective  Was able to work in yard x 2 hours yesterday (wlith 3 short rest periods of 5 minutes). Pretty tired afterwards but no increased pain.     Patient is accompained by:  Family member    Pertinent History  CAD, CHF, HTN, COPD, DM, bil plant fasc, bil knee OA,     Patient Stated Goals  I've got to get my balance, strength, coordination back together; be able to return to work    Currently in Pain?  No/denies    Pain Onset  --                       [facility] Adult PT Treatment/Exercise - 02/11/18 0812      Lumbar Exercises: Aerobic   Tread Mill  2.5 mph 1% grade x 8 minutes + 1 min cooldown; 0.51mi      Lumbar Exercises: Seated   Other Seated Lumbar Exercises  lat pull down 50# x 10 x 2 sets      Knee/Hip Exercises: Standing   Other Standing Knee Exercises  crate with 40# weights, waist height to 14" stool x 10 turn to right; 50# x 5 turn to right      Patient ascended/descended A-frame ladder (up to 3rd  rung--limited by ceiling height) x 5 using bil UEs for support; then carrying simulated 10# bag/bucket in right hand to place on top of ladder x 5 (and then bring back down the ladder to place on the floor).   Pushups with bil UEs on kitchen height counter x 5 reps x 2 sets. vc for lowering hips to achieve full hip extension and for core stabiity       PT Education - 02/11/18 1935    Education Details  addition to [employer]) Educated  Patient    Methods  Explanation;Demonstration;Handout    Comprehension  Returned demonstration;Verbalized understanding       PT Short Term Goals - 01/28/18 0925      PT SHORT TERM GOAL #1   Title  Patient will be independent with HEP addressing balance and LE strength (Target date 01/29/18)    Time  4    Period  Weeks    Status  Achieved      PT SHORT TERM GOAL #2   Title  Patient will improve gait velocity to >=2.62 ft/sec to demonstrate increased safety with community level ambulation and lesser fall risk.    Baseline  2.26 ft/sec 4/4 4.00 ft/sec    Time  4    Period  Weeks    Status  Achieved      PT SHORT TERM GOAL #3   Title  Patient will improve Berg Balance score to >=52/56 to show decreased fall risk.    Baseline  4/4 55/56    Time  4    Period  Weeks    Status  Achieved      PT SHORT TERM GOAL #4   Title  Patient will ambulate 1000 ft over paved outdoor terrain with [facility] and modified independent.    Time  4    Period  Weeks    Status  Achieved      PT SHORT TERM GOAL #5   Title  Patient can ascend/descend 12 steps alternating pattern with SPC and supervision (demonstrating improved balance and control on stairs)    Baseline  see 4/2    Time  4    Period  Weeks  Status  Achieved        PT Long Term Goals - 02/02/18 1945      PT LONG TERM GOAL #1   Title  Patient will be independent with updated HEP and able to verbalize a plan for community-based activity upone discharge from PT.  (Target all LTGs 02/19/18)    Time   7    Period  Weeks    Status  On-going      PT LONG TERM GOAL #2   Title  Patient will improve gait velocity to >= 3.0 ft/sec to show improved safety in community ambulation (and approaching normal velocity for male 51-59 yo)    Baseline  01/28/18 4.0 ft/sec    Time  7    Period  Weeks    Status  Achieved      PT LONG TERM GOAL #3   Title  Patient will ambulate 1000 ft over outdoor unlevel terrain (ramps, curbs, grass, gravel) with LRAD and no LOB.     Time  7    Period  Weeks    Status  Achieved      PT LONG TERM GOAL #4   Title  Patient will ascend/descend 12 steps alternating pattern with no UE support.     Time  7    Period  Weeks    Status  On-going      PT LONG TERM GOAL #5   Title  Patient will tolerate lifting 50# repeated x 3 and 100# x 1 rep. (simulating lifting box )    Time  3    Status  New            Plan - 02/11/18 0817    Clinical Impression Statement  Continue to progress activity intensity with return to work as the goal. Patient also increasing his work load at home with good tolerance. Fatigue continues to be primary limiting factor. Plan to begin to assess progress towards LTGs next visit.     Rehab Potential  Good    Clinical Impairments Affecting Rehab Potential  bil knee pain due to arthritis (left worst); peripheral neuropathy    PT Frequency  2x / week    PT Duration  Other (comment) 7 weeks    PT Treatment/Interventions  ADLs/Self Care Home Management;Aquatic Therapy;Electrical Stimulation;DME Instruction;Gait training;Neuromuscular re-education;Balance training;Therapeutic exercise;Therapeutic activities;Functional mobility training;Stair training;Patient/family education;Orthotic Fit/Training;Passive range of motion    PT Next Visit Plan  begin to check LTGs; work on return to work (including lifting up to 50-100 pounds)--using weight stack with two handles; Focus on strengthening LEs (including leg press); increase ambulation time for return to  work (treadmill); elliptical    Consulted and Agree with Plan of Care  Patient       Patient will benefit from skilled therapeutic intervention in order to improve the following deficits and impairments:  Abnormal gait, Decreased balance, Decreased mobility, Decreased knowledge of use of DME, Decreased coordination, Decreased range of motion, Decreased strength, Impaired sensation, Postural dysfunction, Impaired UE functional use, Obesity, Pain  Visit Diagnosis: Muscle weakness (generalized)  Hemiplegia and hemiparesis following cerebral infarction affecting right dominant side [facility])     Problem List Patient Active Problem List   Diagnosis Date Noted  . Late effect of stroke 12/26/2017  . History of stroke 12/16/2017  . BMI 36.0-36.9,adult 08/05/2017  . Cholelithiasis 08/05/2017  . Bilateral plantar fasciitis 08/05/2017  . Situational mixed anxiety and depressive disorder 08/05/2017  . Bilateral primary osteoarthritis of knee 07/01/2017  .  Asthma 01/26/2017  . Overactive bladder 01/21/2017  . BPH with obstruction/lower urinary tract symptoms 01/21/2017  . S/P appendectomy 01/07/2017  . Diverticulosis 11/24/2016  . Type 2 diabetes mellitus without complication, without long-term current use of insulin ([place]) 10/31/2016  . Aortic insufficiency 11/23/2012  . Chronic diastolic CHF (congestive heart failure) ([place]) 11/23/2012  . CAD (coronary artery disease) 11/27/2011  . OSA (obstructive sleep apnea) 11/11/2011  . Aortic root dilatation ([place])   . Hyperlipidemia 01/23/2011  . Hypertension 01/23/2011    Rexanne Mano, PT 02/11/2018, 7:42 PM  [facility] [address] [place], [place], [address] Phone: [phone]   Fax:  [phone]  Name: Jonathan Miles MRN: [id number] Date of Birth: August 18, 1961

## 2018-02-11 NOTE — Patient Instructions (Signed)
Push-Up: Standing (Table)    Stand at the counter with hands more than shoulder width apart. Bend arms, lowering body toward counter, keeping head and trunk stable. Straighten elbows, keeping spine straight.  Repeat __10_ times. Do __1_ sessions per day.  Copyright  VHI. All rights reserved.

## 2018-02-16 ENCOUNTER — Ambulatory Visit: Payer: BLUE CROSS/BLUE SHIELD | Admitting: Physical Therapy

## 2018-02-16 ENCOUNTER — Encounter: Payer: Self-pay | Admitting: Physical Therapy

## 2018-02-16 ENCOUNTER — Encounter: Payer: BLUE CROSS/BLUE SHIELD | Admitting: Occupational Therapy

## 2018-02-16 DIAGNOSIS — M6281 Muscle weakness (generalized): Secondary | ICD-10-CM

## 2018-02-16 DIAGNOSIS — R2681 Unsteadiness on feet: Secondary | ICD-10-CM

## 2018-02-16 NOTE — Therapy (Signed)
[facility] [address] [place], [place], [address] Phone: [phone]   Fax:  [phone]  Physical Therapy Treatment  Patient Details  Name: Jonathan Miles MRN: [id number] Date of Birth: 1961/01/05 Referring Provider: Antony Contras   Encounter Date: 02/16/2018  PT End of Session - 02/16/18 0958    Visit Number  14    Number of Visits  15    Date for PT Re-Evaluation  02/19/18    Authorization Type  BC[employer]; PT and OT combined limit 30 visits    Authorization - Visit Number  54 includes 4 OT visits    Authorization - Number of Visits  30 PT/OT combined 30 VL    PT Start Time  0803    PT Stop Time  0846    PT Time Calculation (min)  43 min    Activity Tolerance  Patient tolerated treatment well    Behavior During Therapy  [facility] for tasks assessed/performed       Past Medical History:  Diagnosis Date  . Alcohol abuse   . Aortic root dilatation (HCC)    a. echo 4/12: EF 55-60%, mod to marked dilated ascending aortic root;   b. MRA 4/12: Aortic root 5.5 cm, dilation of left and right coronary cusps, trileaflet aortic valve // Echo 2/19: Moderate LVH, EF 55-60, normal wall motion, grade 1 diastolic dysfunction, mild AI, mildly dilated aortic root, moderate to severe LAE (aortic root 40 mm; ascending aorta 39 mm)   . Arthritis   . Asthma    ast attack in early 20's  . CAD (coronary artery disease)   . Chest pain    Myoview 4/12: EF 62%, no ischemia or scar  . CHF (congestive heart failure) ([place])   . Chronic pain    back and hands  . Constipation   . COPD (chronic obstructive pulmonary disease) ([place])   . Depression   . Diabetes mellitus    type 2  . Difficulty speaking   . Drug use   . Enlarged prostate   . Fatigue   . GERD (gastroesophageal reflux disease)    modifies with diet  . Hx of echocardiogram    Echo (2/16):  Mod LVH, EF 55-60%, mild to mod AI, mod LAE, mild RAE, Aortic root 38 mm, Asc aorta 43 mm  .  Hyperlipidemia   . Hypertension   . Joint pain   . Knee pain, bilateral   . Leg pain    ABIs 3/19: Normal (R 1.3; L 1.28)  . OSA (obstructive sleep apnea) 2006   .  Wears at times  . Osteoarthritis   . Palpitations   . Stroke [facility])     Past Surgical History:  Procedure Laterality Date  . APPENDECTOMY  12/2016  . Colonscopy    . CORONARY ARTERY BYPASS GRAFT  05/07/2012   Procedure: CORONARY ARTERY BYPASS GRAFTING (CABG);  Surgeon: Grace Isaac, MD;  Location: [facility];  Service: Open Heart Surgery;  Laterality: N/A;  . KNEE ARTHROSCOPY Left 2010   Dr. Rosamaria Lints  . LAPAROSCOPIC APPENDECTOMY N/A 01/06/2017   Procedure: APPENDECTOMY LAPAROSCOPIC;  Surgeon: Ralene Ok, MD;  Location: [facility];  Service: General;  Laterality: N/A;  . Retactment of fingers     as a child , sewed with sewing machine- Index and middle finger  . UPPER GASTROINTESTINAL ENDOSCOPY      There were no vitals filed for this visit.  Subjective Assessment - 02/16/18 0806    Subjective  No changes. Has begun driving. did a long walk at [facility] yesterday    Patient is accompained by:  Family member    Pertinent History  CAD, CHF, HTN, COPD, DM, bil plant fasc, bil knee OA,     Patient Stated Goals  I've got to get my balance, strength, coordination back together; be able to return to work    Currently in Pain?  No/denies                       [facility] Adult PT Treatment/Exercise - 02/16/18 0350      Ambulation/Gait   Ambulation/Gait Assistance  7: Independent    Ambulation/Gait Assistance Details  carrying 10# "bucket" and switching hands; head turns; dual task cognitive challenge with no drifting or LOB    Ambulation Distance (Feet)  1000 Feet    Assistive device  None    Gait Pattern  Within Functional Limits    Ambulation Surface  Level;Indoor      Lumbar Exercises: Seated   Other Seated Lumbar Exercises  lat pull down 55# x 10 x 2 sets      Knee/Hip Exercises: Aerobic   Elliptical  2  min fwd 40+ RPM; rest 45 seconds; 1 min backwards      Knee/Hip Exercises: Machines for Strengthening   Total Gym Leg Press  130# x 10 reps; 150# x 10 x 2;     Other Machine  working PF bil on leg press; 100# x 10 knees bent and knees straight      Knee/Hip Exercises: Standing   Other Standing Knee Exercises  crate with 40# weights, waist height to 14" stool x 10 turn to leftt; 50# x 10 turn to left    Other Standing Knee Exercises  weight stack knee to chest height lift 50# x 10; 105# x 3;                PT Short Term Goals - 01/28/18 0938      PT SHORT TERM GOAL #1   Title  Patient will be independent with HEP addressing balance and LE strength (Target date 01/29/18)    Time  4    Period  Weeks    Status  Achieved      PT SHORT TERM GOAL #2   Title  Patient will improve gait velocity to >=2.62 ft/sec to demonstrate increased safety with community level ambulation and lesser fall risk.    Baseline  2.26 ft/sec 4/4 4.00 ft/sec    Time  4    Period  Weeks    Status  Achieved      PT SHORT TERM GOAL #3   Title  Patient will improve Berg Balance score to >=52/56 to show decreased fall risk.    Baseline  4/4 55/56    Time  4    Period  Weeks    Status  Achieved      PT SHORT TERM GOAL #4   Title  Patient will ambulate 1000 ft over paved outdoor terrain with [facility] and modified independent.    Time  4    Period  Weeks    Status  Achieved      PT SHORT TERM GOAL #5   Title  Patient can ascend/descend 12 steps alternating pattern with SPC and supervision (demonstrating improved balance and control on stairs)    Baseline  see 4/2    Time  4    Period  Weeks  Status  Achieved        PT Long Term Goals - 02/02/18 1945      PT LONG TERM GOAL #1   Title  Patient will be independent with updated HEP and able to verbalize a plan for community-based activity upone discharge from PT.  (Target all LTGs 02/19/18)    Time  7    Period  Weeks    Status  On-going      PT LONG  TERM GOAL #2   Title  Patient will improve gait velocity to >= 3.0 ft/sec to show improved safety in community ambulation (and approaching normal velocity for male 2-59 yo)    Baseline  01/28/18 4.0 ft/sec    Time  7    Period  Weeks    Status  Achieved      PT LONG TERM GOAL #3   Title  Patient will ambulate 1000 ft over outdoor unlevel terrain (ramps, curbs, grass, gravel) with LRAD and no LOB.     Time  7    Period  Weeks    Status  Achieved      PT LONG TERM GOAL #4   Title  Patient will ascend/descend 12 steps alternating pattern with no UE support.     Time  7    Period  Weeks    Status  On-going      PT LONG TERM GOAL #5   Title  Patient will tolerate lifting 50# repeated x 3 and 100# x 1 rep. (simulating lifting box )    Time  3    Status  New            Plan - 02/16/18 0959    Clinical Impression Statement  Session focused on strengthening and return to work activities with balance challenges. Patient progressing well. Continues to overall be most limited by extreme fatigue (still requires naps during the day). Anticipate discharge from PT at next visit.     Rehab Potential  Good    Clinical Impairments Affecting Rehab Potential  bil knee pain due to arthritis (left worst); peripheral neuropathy    PT Frequency  2x / week    PT Duration  Other (comment) 7 weeks    PT Treatment/Interventions  ADLs/Self Care Home Management;Aquatic Therapy;Electrical Stimulation;DME Instruction;Gait training;Neuromuscular re-education;Balance training;Therapeutic exercise;Therapeutic activities;Functional mobility training;Stair training;Patient/family education;Orthotic Fit/Training;Passive range of motion    PT Next Visit Plan  check LTGs; work on return to work (including lifting up to 50-100 pounds)--using weight stack with two handles; Focus on strengthening LEs (including leg press); increase ambulation time for return to work (treadmill); elliptical    Consulted and Agree with Plan  of Care  Patient       Patient will benefit from skilled therapeutic intervention in order to improve the following deficits and impairments:  Abnormal gait, Decreased balance, Decreased mobility, Decreased knowledge of use of DME, Decreased coordination, Decreased range of motion, Decreased strength, Impaired sensation, Postural dysfunction, Impaired UE functional use, Obesity, Pain  Visit Diagnosis: Muscle weakness (generalized)  Unsteadiness on feet     Problem List Patient Active Problem List   Diagnosis Date Noted  . Late effect of stroke 12/26/2017  . History of stroke 12/16/2017  . BMI 36.0-36.9,adult 08/05/2017  . Cholelithiasis 08/05/2017  . Bilateral plantar fasciitis 08/05/2017  . Situational mixed anxiety and depressive disorder 08/05/2017  . Bilateral primary osteoarthritis of knee 07/01/2017  . Asthma 01/26/2017  . Overactive bladder 01/21/2017  . BPH with obstruction/lower  urinary tract symptoms 01/21/2017  . S/P appendectomy 01/07/2017  . Diverticulosis 11/24/2016  . Type 2 diabetes mellitus without complication, without long-term current use of insulin ([place]) 10/31/2016  . Aortic insufficiency 11/23/2012  . Chronic diastolic CHF (congestive heart failure) ([place]) 11/23/2012  . CAD (coronary artery disease) 11/27/2011  . OSA (obstructive sleep apnea) 11/11/2011  . Aortic root dilatation ([place])   . Hyperlipidemia 01/23/2011  . Hypertension 01/23/2011    Jonathan Miles, PT 02/16/2018, 10:01 AM  [facility] [address] [place], [place], [address] Phone: [phone]   Fax:  [phone]  Name: Jonathan Miles MRN: [id number] Date of Birth: 09-18-1961

## 2018-02-17 ENCOUNTER — Other Ambulatory Visit: Payer: Self-pay

## 2018-02-18 ENCOUNTER — Encounter: Payer: Self-pay | Admitting: Physical Therapy

## 2018-02-18 ENCOUNTER — Ambulatory Visit: Payer: BLUE CROSS/BLUE SHIELD | Admitting: Physical Therapy

## 2018-02-18 ENCOUNTER — Encounter: Payer: BLUE CROSS/BLUE SHIELD | Admitting: *Deleted

## 2018-02-18 DIAGNOSIS — R2689 Other abnormalities of gait and mobility: Secondary | ICD-10-CM

## 2018-02-18 DIAGNOSIS — M6281 Muscle weakness (generalized): Secondary | ICD-10-CM

## 2018-02-18 NOTE — Telephone Encounter (Signed)
It looks like we had previously asked him to avoid NSAIDs b/c of his hx of CAD.  He was on ASA 81.  But, he was increased to 325 by Neuro during admission for CVA in 11/2017.  Usually they recommend ASA 325 for 90 days post stroke, but it was not clear from the notes in his chart how long the 325 should be continued.  So, I would keep him on ASA 325 QD for now until he sees Neuro back in follow up. Thanks! Richardson Dopp, PA-C    02/18/2018 8:04 AM

## 2018-02-18 NOTE — Therapy (Signed)
[facility] [address] [place], [place], [address] Phone: [phone]   Fax:  [phone]  Physical Therapy Treatment and Discharge Summary  Patient Details  Name: Jonathan Miles MRN: [id number] Date of Birth: [birth year] Referring Provider: Antony Contras   Encounter Date: 02/18/2018  PT End of Session - 02/18/18 1115    Visit Number  15    Number of Visits  15    Date for PT Re-Evaluation  02/19/18    Authorization Type  [employer]; PT and OT combined limit 30 visits    Authorization - Visit Number  40 includes 4 OT visits    Authorization - Number of Visits  30 PT/OT combined 30 VL    PT Start Time  1020    PT Stop Time  1102    PT Time Calculation (min)  42 min    Activity Tolerance  Patient tolerated treatment well    Behavior During Therapy  [facility] for tasks assessed/performed       Past Medical History:  Diagnosis Date  . Alcohol abuse   . Aortic root dilatation (HCC)    a. echo 4/12: EF 55-60%, mod to marked dilated ascending aortic root;   b. MRA 4/12: Aortic root 5.5 cm, dilation of left and right coronary cusps, trileaflet aortic valve // Echo 2/19: Moderate LVH, EF 55-60, normal wall motion, grade 1 diastolic dysfunction, mild AI, mildly dilated aortic root, moderate to severe LAE (aortic root 40 mm; ascending aorta 39 mm)   . Arthritis   . Asthma    ast attack in early 20's  . CAD (coronary artery disease)   . Chest pain    Myoview 4/12: EF 62%, no ischemia or scar  . CHF (congestive heart failure) ([place])   . Chronic pain    back and hands  . Constipation   . COPD (chronic obstructive pulmonary disease) ([place])   . Depression   . Diabetes mellitus    type 2  . Difficulty speaking   . Drug use   . Enlarged prostate   . Fatigue   . GERD (gastroesophageal reflux disease)    modifies with diet  . Hx of echocardiogram    Echo (2/16):  Mod LVH, EF 55-60%, mild to mod AI, mod LAE, mild RAE, Aortic root 38  mm, Asc aorta 43 mm  . Hyperlipidemia   . Hypertension   . Joint pain   . Knee pain, bilateral   . Leg pain    ABIs 3/19: Normal (R 1.3; L 1.28)  . OSA (obstructive sleep apnea) 2006   .  Wears at times  . Osteoarthritis   . Palpitations   . Stroke [facility])     Past Surgical History:  Procedure Laterality Date  . APPENDECTOMY  12/2016  . Colonscopy    . CORONARY ARTERY BYPASS GRAFT  05/07/2012   Procedure: CORONARY ARTERY BYPASS GRAFTING (CABG);  Surgeon: Grace Isaac, MD;  Location: [place];  Service: Open Heart Surgery;  Laterality: N/A;  . KNEE ARTHROSCOPY Left 2010   Dr. Rosamaria Lints  . LAPAROSCOPIC APPENDECTOMY N/A 01/06/2017   Procedure: APPENDECTOMY LAPAROSCOPIC;  Surgeon: Ralene Ok, MD;  Location: [place];  Service: General;  Laterality: N/A;  . Retactment of fingers     as a child , sewed with sewing machine- Index and middle finger  . UPPER GASTROINTESTINAL ENDOSCOPY      There were no vitals filed for this visit.  Subjective Assessment - 02/18/18 1022  Subjective  No changes. Sees [facility] next week to discuss ? return to work. Fatigue remains his most limiting factor. Does not think his employer will allow him to return part-time and build up his hours. Mentions his cholesterol medication was doubled after CVA and wonders if some of the fatigue and muscle cramps are due to this medication.     Patient is accompained by:  Family member    Pertinent History  CAD, CHF, HTN, COPD, DM, bil plant fasc, bil knee OA,     Patient Stated Goals  I've got to get my balance, strength, coordination back together; be able to return to work    Currently in Pain?  No/denies         [facility] PT Assessment - 02/18/18 1026      Transfers   Five time sit to stand comments   11.12      Ambulation/Gait   Gait velocity  32.8/8.18=4.01 ft/sec                   [facility] Adult PT Treatment/Exercise - 02/18/18 1026      Ambulation/Gait   Ambulation/Gait Assistance  7:  Independent    Gait Pattern  Within Functional Limits      Lumbar Exercises: Seated   Other Seated Lumbar Exercises  lat pull down 55# x 10 x 2 sets      Knee/Hip Exercises: Aerobic   Elliptical  2 min fwd 40+ RPM; rest 45 seconds; 1 min backwards      Knee/Hip Exercises: Machines for Strengthening   Total Gym Leg Press  100# x 2 sets; 150# x 10     Other Machine  working PF bil on leg press; 100# x 10 knees bent and knees straight      Knee/Hip Exercises: Standing   Other Standing Knee Exercises  weight stack knee to chest height lift 50# x 10; 105# x 3;              PT Education - 02/18/18 1114    Education Details  results of assessments compared to normal; discuss fatigue and ?correlation with incr cholesterol medicine with NP next week    Person(s) Educated  Patient    Methods  Explanation    Comprehension  Verbalized understanding       PT Short Term Goals - 01/28/18 0925      PT SHORT TERM GOAL #1   Title  Patient will be independent with HEP addressing balance and LE strength (Target date 01/29/18)    Time  4    Period  Weeks    Status  Achieved      PT SHORT TERM GOAL #2   Title  Patient will improve gait velocity to >=2.62 ft/sec to demonstrate increased safety with community level ambulation and lesser fall risk.    Baseline  2.26 ft/sec 4/4 4.00 ft/sec    Time  4    Period  Weeks    Status  Achieved      PT SHORT TERM GOAL #3   Title  Patient will improve Berg Balance score to >=52/56 to show decreased fall risk.    Baseline  4/4 55/56    Time  4    Period  Weeks    Status  Achieved      PT SHORT TERM GOAL #4   Title  Patient will ambulate 1000 ft over paved outdoor terrain with [facility] and modified independent.    Time  4    Period  Weeks    Status  Achieved      PT SHORT TERM GOAL #5   Title  Patient can ascend/descend 12 steps alternating pattern with SPC and supervision (demonstrating improved balance and control on stairs)    Baseline  see 4/2     Time  4    Period  Weeks    Status  Achieved        PT Long Term Goals - 02/18/18 1116      PT LONG TERM GOAL #1   Title  Patient will be independent with updated HEP and able to verbalize a plan for community-based activity upone discharge from PT.  (Target all LTGs 02/19/18)    Baseline  Plans to continue HEP and perhaps join gym (not sure he can afford)    Time  7    Period  Weeks    Status  Achieved      PT LONG TERM GOAL #2   Title  Patient will improve gait velocity to >= 3.0 ft/sec to show improved safety in community ambulation (and approaching normal velocity for male 3-59 yo)    Baseline  01/28/18 4.0 ft/sec  4/35/19 4.01 ft/sec    Time  7    Period  Weeks    Status  Achieved      PT LONG TERM GOAL #3   Title  Patient will ambulate 1000 ft over outdoor unlevel terrain (ramps, curbs, grass, gravel) with LRAD and no LOB.     Time  7    Period  Weeks    Status  Achieved      PT LONG TERM GOAL #4   Title  Patient will ascend/descend 12 steps alternating pattern with no UE support.     Time  7    Period  Weeks    Status  Achieved      PT LONG TERM GOAL #5   Title  Patient will tolerate lifting 50# repeated x 3 and 100# x 1 rep. (simulating lifting box )    Baseline  55# weight stack x 10; 50# free weights in crate x 10; 105# weight stack x 5    Time  3    Status  Achieved            Plan - 02/18/18 1121    Clinical Impression Statement  Patient's final visit with LTGs assessed with pt meeting 5 of 5 goals. He reports his biggest concern with return to work is his level of fatigue. (He has demonstrated ability to lift 50# repeatedly and 100# x5 reps). Encouraged him to discuss potential return to work part-time with neurologist. Overall, pt has been very motivated, attended every planned PT session and met all goals. Patient is discharged from PT.    Rehab Potential  Good    Clinical Impairments Affecting Rehab Potential  bil knee pain due to arthritis (left  worst); peripheral neuropathy    PT Frequency  2x / week    PT Duration  Other (comment) 7 weeks    PT Treatment/Interventions  ADLs/Self Care Home Management;Aquatic Therapy;Electrical Stimulation;DME Instruction;Gait training;Neuromuscular re-education;Balance training;Therapeutic exercise;Therapeutic activities;Functional mobility training;Stair training;Patient/family education;Orthotic Fit/Training;Passive range of motion    Consulted and Agree with Plan of Care  Patient       Patient will benefit from skilled therapeutic intervention in order to improve the following deficits and impairments:  Abnormal gait, Decreased balance, Decreased mobility, Decreased knowledge of use of DME, Decreased  coordination, Decreased range of motion, Decreased strength, Impaired sensation, Postural dysfunction, Impaired UE functional use, Obesity, Pain  Visit Diagnosis: Muscle weakness (generalized)  Other abnormalities of gait and mobility     Problem List Patient Active Problem List   Diagnosis Date Noted  . Late effect of stroke 12/26/2017  . History of stroke 12/16/2017  . BMI 36.0-36.9,adult 08/05/2017  . Cholelithiasis 08/05/2017  . Bilateral plantar fasciitis 08/05/2017  . Situational mixed anxiety and depressive disorder 08/05/2017  . Bilateral primary osteoarthritis of knee 07/01/2017  . Asthma 01/26/2017  . Overactive bladder 01/21/2017  . BPH with obstruction/lower urinary tract symptoms 01/21/2017  . S/P appendectomy 01/07/2017  . Diverticulosis 11/24/2016  . Type 2 diabetes mellitus without complication, without long-term current use of insulin ([place]) 10/31/2016  . Aortic insufficiency 11/23/2012  . Chronic diastolic CHF (congestive heart failure) ([place]) 11/23/2012  . CAD (coronary artery disease) 11/27/2011  . OSA (obstructive sleep apnea) 11/11/2011  . Aortic root dilatation ([place])   . Hyperlipidemia 01/23/2011  . Hypertension 01/23/2011   PHYSICAL THERAPY DISCHARGE  SUMMARY  Visits from Start of Care: 15  Current functional level related to goals / functional outcomes: Independent with all mobility and no device; fatigue remains most limiting factor   Remaining deficits: Decreased endurance and fatigue   Education / Equipment: HEP;   Plan: Patient agrees to discharge.  Patient goals were met. Patient is being discharged due to meeting the stated rehab goals.  ?????       Rexanne Mano, PT 02/18/2018, 11:25 AM  [facility] [address] [place], [place], [address] Phone: [phone]   Fax:  [phone]  Name: Jonathan Miles MRN: [id number] Date of Birth: 1961/03/17

## 2018-02-20 MED ORDER — ASPIRIN 325 MG PO TBEC: 325.0000 mg | DELAYED_RELEASE_TABLET | Freq: Every day | ORAL | 3 refills | Status: AC

## 2018-02-23 NOTE — Progress Notes (Signed)
[facility] [address] [place]. [place] [address] [phone]       OFFICE FOLLOW UP NOTE  Jonathan Miles Date of Birth:  April 01, 1961 Medical Record Number:  [id number]   Reason for Referral:  hospital stroke follow up  CHIEF COMPLAINT:  Chief Complaint  Patient presents with  . 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 History obtained from patient and chart review. Reviewed all radiology images and labs personally.  Jonathan Miles is a 57 year old male with PMH of DM, HTN, HLD, OSA on CPAP and CAD who presented with acute onset of dizziness, slurred speech and ataxia on 12/16/2017.  CT head reviewed and showed no intracranial abnormality.  MRI brain reviewed and did show an acute infarct in the left pons.  Bilateral carotid ultrasound performed and showed bilateral ICA stenosis of 1-39%.  2D echo performed and showed an EF of 55-60% with moderate LVH. LDL 82 and was recommended to increase Lipitor from 40 mg to 80 mg.  A1c 5.5.  Patient was previously on aspirin 81 mg and was recommended to increase his to aspirin 325 mg daily.  Patient was discharged home in stable condition. Since discharge, patient has been participating in PT/OT.  He is accompanied at today's appointment with his wife.  He completed OT but continues PT 2 times per week.  He is making great improvement but does continue to exhibit some right hemiplegia.  He does complain about frequent fatigue especially after PT sessions or with increased activity.  He does continue to take aspirin without increase in bruising or bleeding.  Continues to take Lipitor with mild aches but able to tolerate them and these are improving.  Today's blood pressure satisfactory at 135/87.  Patient does have OSA and is compliant with CPAP machine.  He does have complaints of a headache that occurs on a weekly  basis that has been occurring for the past 2 years.  He relates these to stress.  They start in the back of his neck and go up into his head and describes it as pressure.  Does have mild photophobia and phonophobia but denies nausea and vomiting.  Does take Tylenol which does seem to relieve these headaches.  He does continue to take Zoloft which has been helping with depression and anxiety.  As patient is employed doing housing maintenance, which consists of heavy lifting, frequent walking, frequent climbing, and a lot of driving, it would not be safe for him to return to work at this time due to continued right sided hemiplegia and balance difficulties, especially as he is unable to return to work if he has restrictions or unable to go back full-time.  It would be in the best interest of the patient if he continues PT 2 times per week and continues home exercises in order to work on balance and increase strength on right side. No new or worsening stroke/TIA symptoms.  Update 02/24/2018: Patient returns today for 64-month follow-up.  He is being reevaluated for possible return to work.  He recently completed physical therapy as he has made great improvement in his strength.  He does still have complaints of increased fatigue after moderate activity where his symptoms such as dizziness or weakness can become more pronounced.  He states he was able to mow the yard which took him approximately 20 minutes and  patient had to lay down afterwards due to extreme fatigue.  Blood pressure at today's visit 162/94 but states he has not taken his medications yet and typically 130 over 80s at home.  Continues to be compliant with CPAP for OSA.  Continues to take aspirin without side effects of bleeding or bruising.  Continues to take atorvastatin without side effects of myalgias.  Concern for patient to return to work full-time as he does have a high activity demanding job such as lifting heavy objects or climbing on high  ladders.  Concern for if  increase in fatigue does occur, he can become weaker or have episodes of dizziness.  Recommend patient states to return to work on a part-time basis only at this time.  MRA head and neck performed on 02/03/2018 which did show a normal MRA of neck but MRI of head did show possible arthrosclerosis and left vertebral artery but per report, this could also represent artifact.  All remainder medium to large sized intracranial vessels appeared unremarkable.   ROS:   14 system review of systems performed and negative with exception fatigue.  PMH:  Past Medical History:  Diagnosis Date  . Alcohol abuse   . Aortic root dilatation (HCC)    a. echo 4/12: EF 55-60%, mod to marked dilated ascending aortic root;   b. MRA 4/12: Aortic root 5.5 cm, dilation of left and right coronary cusps, trileaflet aortic valve // Echo 2/19: Moderate LVH, EF 55-60, normal wall motion, grade 1 diastolic dysfunction, mild AI, mildly dilated aortic root, moderate to severe LAE (aortic root 40 mm; ascending aorta 39 mm)   . Arthritis   . Asthma    ast attack in early 20's  . CAD (coronary artery disease)   . Chest pain    Myoview 4/12: EF 62%, no ischemia or scar  . CHF (congestive heart failure) ([facility])   . Chronic pain    back and hands  . Constipation   . COPD (chronic obstructive pulmonary disease) ([facility])   . Depression   . Diabetes mellitus    type 2  . Difficulty speaking   . Drug use   . Enlarged prostate   . Fatigue   . GERD (gastroesophageal reflux disease)    modifies with diet  . Hx of echocardiogram    Echo (2/16):  Mod LVH, EF 55-60%, mild to mod AI, mod LAE, mild RAE, Aortic root 38 mm, Asc aorta 43 mm  . Hyperlipidemia   . Hypertension   . Joint pain   . Knee pain, bilateral   . Leg pain    ABIs 3/19: Normal (R 1.3; L 1.28)  . OSA (obstructive sleep apnea) 2006   .  Wears at times  . Osteoarthritis   . Palpitations   . Stroke [facility])     PSH:  Past Surgical History:    Procedure Laterality Date  . APPENDECTOMY  12/2016  . Colonscopy    . CORONARY ARTERY BYPASS GRAFT  05/07/2012   Procedure: CORONARY ARTERY BYPASS GRAFTING (CABG);  Surgeon: Grace Isaac, MD;  Location: [place];  Service: Open Heart Surgery;  Laterality: N/A;  . KNEE ARTHROSCOPY Left 2010   Dr. Rosamaria Lints  . LAPAROSCOPIC APPENDECTOMY N/A 01/06/2017   Procedure: APPENDECTOMY LAPAROSCOPIC;  Surgeon: Ralene Ok, MD;  Location: [place];  Service: General;  Laterality: N/A;  . Retactment of fingers     as a child , sewed with sewing machine- Index and middle finger  . UPPER GASTROINTESTINAL  ENDOSCOPY      Social History:  Social History   Socioeconomic History  . Marital status: Married    Spouse name: Jonathan Miles  . Number of children: 0  . Years of education: 12th grade  . Highest education level: Not on file  Occupational History  . 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  Social Needs  . Financial resource strain: Not on file  . Food insecurity:    Worry: Not on file    Inability: Not on file  . Transportation needs:    Medical: Not on file    Non-medical: Not on file  Tobacco Use  . Smoking status: Former Smoker    Packs/day: 1.00    Years: 13.00    Pack years: 13.00    Types: Cigarettes    Last attempt to quit: 01/23/1988    Years since quitting: 30.1  . Smokeless tobacco: Never Used  Substance and Sexual Activity  . Alcohol use: Yes    Comment: quit 1989  . Drug use: No    Comment: previous cocaine and marijuana use, quit 1988  . Sexual activity: Not Currently    Partners: Female  Lifestyle  . Physical activity:    Days per week: Not on file    Minutes per session: Not on file  . Stress: Not on file  Relationships  . Social connections:    Talks on phone: Not on file    Gets together: Not on file    Attends religious service: Not on file    Active member of club or organization: Not on file     Attends meetings of clubs or organizations: Not on file    Relationship status: Not on file  . Intimate partner violence:    Fear of current or ex partner: Not on file    Emotionally abused: Not on file    Physically abused: Not on file    Forced sexual activity: Not on file  Other Topics Concern  . Not on file  Social History Narrative   Married, lives in [place]. Therapist, occupational for [employer].    Pt is adopted. Unsure of his family medical history.    Family History:  Family History  Adopted: Yes  Family history unknown: Yes    Medications:   Current Outpatient Medications on File Prior to Visit  Medication Sig Dispense Refill  . acetaminophen (TYLENOL) 500 MG tablet TAKE 1-2 TABS EVERY 6 HOURS AS NEEDED FOR PAIN    . aspirin 325 MG EC tablet Take 1 tablet (325 mg total) by mouth at bedtime. 90 tablet 3  . atorvastatin (LIPITOR) 80 MG tablet Take 1 tablet (80 mg total) by mouth daily at 6 PM. 90 tablet 3  . carvedilol (COREG) 12.5 MG tablet TAKE ONE TABLET BY MOUTH TWO TIMES A DAY 180 tablet 2  . felodipine (PLENDIL) 10 MG 24 hr tablet TAKE ONE TABLET BY MOUTH DAILY 90 tablet 2  . furosemide (LASIX) 40 MG tablet TAKE ONE TABLET BY MOUTH DAILY 90 tablet 2  . lidocaine (XYLOCAINE) 5 % ointment Apply 1 application topically as needed. Daily for leg pain 50 g 3  . lisinopril (PRINIVIL,ZESTRIL) 20 MG tablet TAKE ONE TABLET BY MOUTH DAILY 90 tablet 2  . loratadine (CLARITIN) 10 MG tablet Take 10 mg by mouth at bedtime.     . metFORMIN (GLUCOPHAGE) 1000 MG tablet Take 1 tablet (1,000 mg total) by mouth 2 (  two) times daily with a meal. 180 tablet 3  . mirabegron ER (MYRBETRIQ) 50 MG TB24 tablet Take 50 mg by mouth at bedtime.     . sertraline (ZOLOFT) 100 MG tablet Take 1 tablet (100 mg total) by mouth daily. 90 tablet 3  . tamsulosin (FLOMAX) 0.4 MG CAPS capsule Take 2 capsules (0.8 mg total) by mouth daily after supper. 180 capsule 3   No current facility-administered  medications on file prior to visit.     Allergies:   Allergies  Allergen Reactions  . Ibuprofen Other (See Comments)    TOLD NOT TO TAKE-PER CARDS  . Iodinated Diagnostic Agents Hives    Hives started 4 days after cta chest was performed, minimal relief w/ benadryl x 3 days, uncertain if reaction was actually iv contrast or other allergen,allergy tests to follow.wife will make [place] aware of results//a.calhoun     Physical Exam  Vitals:   02/24/18 1040  BP: (!) 162/94  Pulse: 71  Weight: (!) 309 lb 6.4 oz (140.3 kg)  Height: 6\' 3"  (1.905 m)   Body mass index is 38.67 kg/m. No exam data present  General: well developed, pleasant middle-aged male, well nourished, seated, in no evident distress Head: head normocephalic and atraumatic.   Neck: supple with no carotid or supraclavicular bruits Cardiovascular: regular rate and rhythm, no murmurs Musculoskeletal: no deformity Skin:  no rash/petichiae Vascular:  Normal pulses all extremities  Neurologic Exam Mental Status: Awake and fully alert.  No dysarthria present.  Oriented to place and time. Recent and remote memory intact. Attention span, concentration and fund of knowledge appropriate. Mood and affect appropriate.  Cranial Nerves: Fundoscopic exam reveals sharp disc margins. Pupils equal, briskly reactive to light. Extraocular movements full without nystagmus. Visual fields full to confrontation. Hearing intact. Facial sensation intact.  Minor right facial paralysis. Motor: 5/5 strength in all extremities tested Sensory.: intact to touch , pinprick , position and vibratory sensation.  Coordination: Rapid alternating movements normal in all extremities. Finger-to-nose and heel-to-shin performed accurately bilaterally.   Gait and Station: Arises from chair without difficulty. Stance is normal. Gait demonstrates normal stride but does have stiffness in right leg while ambulating.  Able to heel, toe and tandem walk with mild  difficulty.  Positive Romberg. Reflexes: 1+ and symmetric. Toes downgoing.    NIHSS 0 Modified Rankin  2   Diagnostic Data (Labs, Imaging, Testing)  MRA HEAD WO CONTRAST 02/03/18 IMPRESSION:  MRA head (without) demonstrating: - Hypoplastic left vertebral artery with focal area of decreased flow signal in the distal segment.  While this can be seen with focal atherosclerosis, this more likely represents area of technical artifact, especially when correlated with normal-appearing flow signal on MRA neck from same day. - Remainder of medium to large sized intracranial vessels appear unremarkable.  MRA NECK WO CONTRAST 02/03/18 IMPRESSION:  Normal MRA neck (with and without).     Ct Head Wo Contrast Result Date: 12/16/2017  IMPRESSION: 1. No evidence of acute intracranial abnormality. 2. Mild chronic small vessel ischemic disease.   [name] Result Date: 12/16/2017 IMPRESSION: 1 cm acute infarct left pons Mild to moderate chronic microvascular ischemic changes in the cerebral white matter bilaterally. Electronically Signed      Echocardiogram:      12/17/2017  Study Conclusions - Left ventricle: The cavity size was normal. Wall thickness was increased in a pattern of moderate LVH. Systolic function was normal. The estimated ejection fraction was in the range of 55% to 60%. Wall motion was normal; there were no regional wall motion abnormalities. Doppler parameters are consistent with abnormal left ventricular relaxation (grade 1 diastolic dysfunction). - Aortic valve: There was mild regurgitation. Valve area (VTI): 3.48 cm^2. Valve area (Vmax): 3.49 cm^2. Valve area (Vmean): 3.31 cm^2. - Aortic root: The aortic root was mildly dilated. - Left atrium: The atrium was moderately to severely dilated.  B/L Carotid U/S:   12/17/2017                                         Final Interpretation: Right Carotid:  Velocities in the right ICA are consistent with a 1-39% stenosis. Left Carotid: Velocities in the left ICA are consistent with a 1-39% stenosis. Vertebrals: Both vertebral arteries were patent with antegrade flow. Subclavians: Normal flow hemodynamics were seen in bilateral subclavian arteries.    ASSESSMENT: Jonathan Miles is a 57 y.o. 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 Vascular risk factors include HTN, HLD, DM, and CAD.  Patient returns today for one-month follow-up for reevaluation of return to work.    PLAN: -Continue aspirin 325 mg daily  and lipitor  for secondary stroke prevention -F/u with PCP regarding your HTN, HLD and DM management -continue to monitor BP at home -Patient cleared to return to work part-time status only as patient does experience fatigue with moderate activity for prolonged periods of time which can exacerbate his symptoms of weakness and dizziness.  If patient is unable to return to work part-time, he will be reevaluated in 2 weeks time for possible return to work a full-time basis.  Patient provided with work note -Maintain strict control of hypertension with blood pressure goal below 130/90, diabetes with hemoglobin A1c goal below 6.5% and cholesterol with LDL cholesterol (bad cholesterol) goal below 70 mg/dL. I also advised the patient to eat a healthy diet with plenty of whole grains, cereals, fruits and vegetables, exercise regularly and maintain ideal body weight.  Follow up in 2 weeks or call earlier if needed  Greater than 50% time during this 25 minute consultation visit was spent on counseling and coordination of care about HLD, HTN and DM, discussion about risk benefit of anticoagulation and answering questions.   Venancio Poisson, AGNP-BC  [facility] [address] [place], [place] [address]  Phone [phone] Fax [phone]

## 2018-02-24 ENCOUNTER — Ambulatory Visit (INDEPENDENT_AMBULATORY_CARE_PROVIDER_SITE_OTHER): Payer: BLUE CROSS/BLUE SHIELD | Admitting: Adult Health

## 2018-02-24 ENCOUNTER — Encounter

## 2018-02-24 ENCOUNTER — Encounter: Payer: Self-pay | Admitting: *Deleted

## 2018-02-24 ENCOUNTER — Encounter: Payer: Self-pay | Admitting: Adult Health

## 2018-02-24 VITALS — BP 162/94 | HR 71 | Ht 75.0 in | Wt 309.4 lb

## 2018-02-24 DIAGNOSIS — I6350 Cerebral infarction due to unspecified occlusion or stenosis of unspecified cerebral artery: Secondary | ICD-10-CM

## 2018-02-24 DIAGNOSIS — E119 Type 2 diabetes mellitus without complications: Secondary | ICD-10-CM

## 2018-02-24 DIAGNOSIS — I10 Essential (primary) hypertension: Secondary | ICD-10-CM

## 2018-02-24 DIAGNOSIS — E785 Hyperlipidemia, unspecified: Secondary | ICD-10-CM

## 2018-02-24 NOTE — Patient Instructions (Signed)
Continue aspirin 325 mg daily  and lipitor  for secondary stroke prevention  Cleared for you to work part time only due to increased fatigue with activity. If you job is unwilling to allow you to go back part time, you are not cleared to return full time at this time and we will re-evaluate in 2 weeks  Continue to follow up with PCP regarding cholesterol, blood pressure and diabetes  Maintain strict control of hypertension with blood pressure goal below 130/90, diabetes with hemoglobin A1c goal below 6.5% and cholesterol with LDL cholesterol (bad cholesterol) goal below 70 mg/dL. I also advised the patient to eat a healthy diet with plenty of whole grains, cereals, fruits and vegetables, exercise regularly and maintain ideal body weight.  Followup in the future with me in 2 weeks

## 2018-02-25 ENCOUNTER — Encounter: Payer: Self-pay | Admitting: Adult Health

## 2018-02-26 ENCOUNTER — Encounter: Payer: Self-pay | Admitting: Adult Health

## 2018-02-26 NOTE — Progress Notes (Signed)
I reviewed above note and agree with the assessment and plan.   Rosalin Hawking, MD PhD Stroke Neurology 02/26/2018 5:57 PM

## 2018-03-01 ENCOUNTER — Encounter: Payer: Self-pay | Admitting: Adult Health

## 2018-03-01 NOTE — Telephone Encounter (Signed)
Katrina, Could you please fax in a letter to [name] job stating that he can work 4 hour days 5 days a week which would be part time work until his re-evaluation on Mar 10, 2018. His limitations will include: no climbing on ladders or high heights and a max lifting of 50 lbs. We will re-evaluate on the 15th and send updated letter at that time. Thank you.

## 2018-03-02 ENCOUNTER — Encounter: Payer: Self-pay | Admitting: Adult Health

## 2018-03-03 ENCOUNTER — Encounter: Payer: Self-pay | Admitting: Adult Health

## 2018-03-03 NOTE — Progress Notes (Signed)
Letter fax to [facility] and human resources per pt request. Both faxes confirmed x2.

## 2018-03-05 ENCOUNTER — Telehealth: Payer: Self-pay

## 2018-03-05 NOTE — Telephone Encounter (Signed)
Office notes and letter fax to [employer] at [phone] x2 and confirmed. Fax with claim number of [id number].

## 2018-03-10 ENCOUNTER — Encounter: Payer: Self-pay | Admitting: Adult Health

## 2018-03-10 ENCOUNTER — Ambulatory Visit (INDEPENDENT_AMBULATORY_CARE_PROVIDER_SITE_OTHER): Payer: BLUE CROSS/BLUE SHIELD | Admitting: Adult Health

## 2018-03-10 VITALS — BP 157/94 | HR 72 | Ht 75.0 in | Wt 315.6 lb

## 2018-03-10 DIAGNOSIS — I6350 Cerebral infarction due to unspecified occlusion or stenosis of unspecified cerebral artery: Secondary | ICD-10-CM | POA: Diagnosis not present

## 2018-03-10 DIAGNOSIS — F4323 Adjustment disorder with mixed anxiety and depressed mood: Secondary | ICD-10-CM

## 2018-03-10 DIAGNOSIS — E785 Hyperlipidemia, unspecified: Secondary | ICD-10-CM

## 2018-03-10 DIAGNOSIS — I10 Essential (primary) hypertension: Secondary | ICD-10-CM | POA: Diagnosis not present

## 2018-03-10 DIAGNOSIS — E119 Type 2 diabetes mellitus without complications: Secondary | ICD-10-CM

## 2018-03-10 MED ORDER — SERTRALINE HCL 100 MG PO TABS: 150.0000 mg | ORAL_TABLET | Freq: Every day | ORAL | 3 refills | Status: DC

## 2018-03-10 NOTE — Progress Notes (Signed)
I reviewed above note and agree with the assessment and plan. Recommend re-evaluation in 3 months and see how much he can do. Thanks.   Rosalin Hawking, MD PhD Stroke Neurology 03/10/2018 6:27 PM

## 2018-03-10 NOTE — Progress Notes (Signed)
[facility] [address] [place]. [place] [address] [phone]       OFFICE FOLLOW UP NOTE  [name] Date of Birth:  1960-11-08 Medical Record Number:  [id number]   Reason for Referral:  hospital stroke follow up  CHIEF COMPLAINT:  Chief Complaint  Patient presents with  . 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 History obtained from patient and chart review. Reviewed all radiology images and labs personally.  Jonathan Miles is a 57 year old male with PMH of DM, HTN, HLD, OSA on CPAP and CAD who presented with acute onset of dizziness, slurred speech and ataxia on 12/16/2017.  CT head reviewed and showed no intracranial abnormality.  MRI brain reviewed and did show an acute infarct in the left pons.  Bilateral carotid ultrasound performed and showed bilateral ICA stenosis of 1-39%.  2D echo performed and showed an EF of 55-60% with moderate LVH. LDL 82 and was recommended to increase Lipitor from 40 mg to 80 mg.  A1c 5.5.  Patient was previously on aspirin 81 mg and was recommended to increase his to aspirin 325 mg daily.  Patient was discharged home in stable condition. Since discharge, patient has been participating in PT/OT.  He is accompanied at today's appointment with his wife.  He completed OT but continues PT 2 times per week.  He is making great improvement but does continue to exhibit some right hemiplegia.  He does complain about frequent fatigue especially after PT sessions or with increased activity.  He does continue to take aspirin without increase in bruising or bleeding.  Continues to take Lipitor with mild aches but able to tolerate them and these are improving.  Today's blood pressure satisfactory at 135/87.  Patient does have OSA and is compliant with CPAP machine.  He does have complaints of a headache that occurs on a  weekly basis that has been occurring for the past 2 years.  He relates these to stress.  They start in the back of his neck and go up into his head and describes it as pressure.  Does have mild photophobia and phonophobia but denies nausea and vomiting.  Does take Tylenol which does seem to relieve these headaches.  He does continue to take Zoloft which has been helping with depression and anxiety.  As patient is employed doing housing maintenance, which consists of heavy lifting, frequent walking, frequent climbing, and a lot of driving, it would not be safe for him to return to work at this time due to continued right sided hemiplegia and balance difficulties, especially as he is unable to return to work if he has restrictions or unable to go back full-time.  It would be in the best interest of the patient if he continues PT 2 times per week and continues home exercises in order to work on balance and increase strength on right side. No new or worsening stroke/TIA symptoms.  02/24/2018 visit: Patient returns today for 86-month follow-up.  He is being reevaluated for possible return to work.  He recently completed physical therapy as he has made great improvement in his strength.  He does still have complaints of increased fatigue after moderate activity where his symptoms such as dizziness or weakness can become more pronounced.  He states he was able to mow the yard which took him  approximately 20 minutes and patient had to lay down afterwards due to extreme fatigue.  Blood pressure at today's visit 162/94 but states he has not taken his medications yet and typically 130 over 80s at home.  Continues to be compliant with CPAP for OSA.  Continues to take aspirin without side effects of bleeding or bruising.  Continues to take atorvastatin without side effects of myalgias.  Concern for patient to return to work full-time as he does have a high activity demanding job such as lifting heavy objects or climbing on high  ladders.  Concern for if  increase in fatigue does occur, he can become weaker or have episodes of dizziness.  Recommend patient states to return to work on a part-time basis only at this time.  MRA head and neck performed on 02/03/2018 which did show a normal MRA of neck but MRI of head did show possible arthrosclerosis and left vertebral artery but per report, this could also represent artifact.  All remainder medium to large sized intracranial vessels appeared unremarkable.  03/10/18 UPDATE: Patient returns today for 2-week follow-up for possible return to work and is accompanied by his wife.  It was recommended that patient return to work part-time at previous visit but with limitations such as not climbing ladders or high elevations or lifting heavy objects.  He continues to feel increased fatigue after activities that prior to the stroke would not make him feel as fatigued.  For example, patient mowed grass this morning with a push mower and feels as though he needed to lay down.  He will start to have difficulty walking, balance issues and a headache with this increased fatigue.  He does only receive approximately 5 to 6 hours of sleep a night due to being restless and "mind going constantly".  Patient does have history of OSA and is compliant with CPAP machine.  Patient does state that he has had increased stress and depression recently due to job stress and being unable to go back to work.  Currently on Zoloft 100 mg.  Patient questioning resources regarding vocational rehab or possibly taking early retirement and would like more information or resources on these topics.  Patient continues to not be safe at this time to return to work full-time or even part-time without restrictions as patient's job consists of climbing on ladders or working at [employer] or lifting heavy objects.  As patient fatigues quickly over smaller jobs/activities, patient would not be safe on ladders or higher elevations due to  increase of difficulty walking and balance issues with increased activity and fatigue.  Denies new or worsening stroke/TIA symptoms.  ROS:   14 system review of systems performed and negative with exception of fatigue, excessive thirst, frequency of urination, joint swelling, neck pain and HA.  PMH:  Past Medical History:  Diagnosis Date  . Alcohol abuse   . Aortic root dilatation (HCC)    a. echo 4/12: EF 55-60%, mod to marked dilated ascending aortic root;   b. MRA 4/12: Aortic root 5.5 cm, dilation of left and right coronary cusps, trileaflet aortic valve // Echo 2/19: Moderate LVH, EF 55-60, normal wall motion, grade 1 diastolic dysfunction, mild AI, mildly dilated aortic root, moderate to severe LAE (aortic root 40 mm; ascending aorta 39 mm)   . Arthritis   . Asthma    ast attack in early 20's  . CAD (coronary artery disease)   . Chest pain    Myoview 4/12: EF 62%, no ischemia  or scar  . CHF (congestive heart failure) ([place])   . Chronic pain    back and hands  . Constipation   . COPD (chronic obstructive pulmonary disease) ([place])   . Depression   . Diabetes mellitus    type 2  . Difficulty speaking   . Drug use   . Enlarged prostate   . Fatigue   . GERD (gastroesophageal reflux disease)    modifies with diet  . Hx of echocardiogram    Echo (2/16):  Mod LVH, EF 55-60%, mild to mod AI, mod LAE, mild RAE, Aortic root 38 mm, Asc aorta 43 mm  . Hyperlipidemia   . Hypertension   . Joint pain   . Knee pain, bilateral   . Leg pain    ABIs 3/19: Normal (R 1.3; L 1.28)  . OSA (obstructive sleep apnea) 2006   .  Wears at times  . Osteoarthritis   . Palpitations   . Stroke [facility])     PSH:  Past Surgical History:  Procedure Laterality Date  . APPENDECTOMY  12/2016  . Colonscopy    . CORONARY ARTERY BYPASS GRAFT  05/07/2012   Procedure: CORONARY ARTERY BYPASS GRAFTING (CABG);  Surgeon: Grace Isaac, MD;  Location: [facility];  Service: Open Heart Surgery;  Laterality: N/A;  .  KNEE ARTHROSCOPY Left 2010   Dr. Rosamaria Lints  . LAPAROSCOPIC APPENDECTOMY N/A 01/06/2017   Procedure: APPENDECTOMY LAPAROSCOPIC;  Surgeon: Ralene Ok, MD;  Location: [facility];  Service: General;  Laterality: N/A;  . Retactment of fingers     as a child , sewed with sewing machine- Index and middle finger  . UPPER GASTROINTESTINAL ENDOSCOPY      Social History:  Social History   Socioeconomic History  . Marital status: Married    Spouse name: Bravery Ketcham  . Number of children: 0  . Years of education: 12th grade  . Highest education level: Not on file  Occupational History  . 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.  Social Needs  . Financial resource strain: Not on file  . Food insecurity:    Worry: Not on file    Inability: Not on file  . Transportation needs:    Medical: Not on file    Non-medical: Not on file  Tobacco Use  . Smoking status: Former Smoker    Packs/day: 1.00    Years: 13.00    Pack years: 13.00    Types: Cigarettes    Last attempt to quit: 01/23/1988    Years since quitting: 30.1  . Smokeless tobacco: Never Used  Substance and Sexual Activity  . Alcohol use: Yes    Comment: quit 1989  . Drug use: No    Comment: previous cocaine and marijuana use, quit 1988  . Sexual activity: Not Currently    Partners: Female  Lifestyle  . Physical activity:    Days per week: Not on file    Minutes per session: Not on file  . Stress: Not on file  Relationships  . Social connections:    Talks on phone: Not on file    Gets together: Not on file    Attends religious service: Not on file    Active member of club or organization: Not on file    Attends meetings of clubs or organizations: Not on file    Relationship status: Not on file  . Intimate partner violence:    Fear of current or  ex partner: Not on file    Emotionally abused: Not on file    Physically abused: Not on file    Forced sexual activity:  Not on file  Other Topics Concern  . Not on file  Social History Narrative   Married, lives in [place]. Therapist, occupational for [employer].    Pt is adopted. Unsure of his family medical history.    Family History:  Family History  Adopted: Yes  Family history unknown: Yes    Medications:   Current Outpatient Medications on File Prior to Visit  Medication Sig Dispense Refill  . acetaminophen (TYLENOL) 500 MG tablet TAKE 1-2 TABS EVERY 6 HOURS AS NEEDED FOR PAIN    . aspirin 325 MG EC tablet Take 1 tablet (325 mg total) by mouth at bedtime. 90 tablet 3  . atorvastatin (LIPITOR) 80 MG tablet Take 1 tablet (80 mg total) by mouth daily at 6 PM. 90 tablet 3  . carvedilol (COREG) 12.5 MG tablet TAKE ONE TABLET BY MOUTH TWO TIMES A DAY 180 tablet 2  . felodipine (PLENDIL) 10 MG 24 hr tablet TAKE ONE TABLET BY MOUTH DAILY 90 tablet 2  . furosemide (LASIX) 40 MG tablet TAKE ONE TABLET BY MOUTH DAILY 90 tablet 2  . lidocaine (XYLOCAINE) 5 % ointment Apply 1 application topically as needed. Daily for leg pain 50 g 3  . lisinopril (PRINIVIL,ZESTRIL) 20 MG tablet TAKE ONE TABLET BY MOUTH DAILY 90 tablet 2  . loratadine (CLARITIN) 10 MG tablet Take 10 mg by mouth at bedtime.     . metFORMIN (GLUCOPHAGE) 1000 MG tablet Take 1 tablet (1,000 mg total) by mouth 2 (two) times daily with a meal. 180 tablet 3  . mirabegron ER (MYRBETRIQ) 50 MG TB24 tablet Take 50 mg by mouth at bedtime.     . tamsulosin (FLOMAX) 0.4 MG CAPS capsule Take 2 capsules (0.8 mg total) by mouth daily after supper. 180 capsule 3   No current facility-administered medications on file prior to visit.     Allergies:   Allergies  Allergen Reactions  . Ibuprofen Other (See Comments)    TOLD NOT TO TAKE-PER CARDS  . Iodinated Diagnostic Agents Hives    Hives started 4 days after cta chest was performed, minimal relief w/ benadryl x 3 days, uncertain if reaction was actually iv contrast or other allergen,allergy tests to  follow.wife will make [place] aware of results//a.calhoun     Physical Exam  Vitals:   03/10/18 1449  BP: (!) 157/94  Pulse: 72  Weight: (!) 315 lb 9.6 oz (143.2 kg)  Height: 6\' 3"  (1.905 m)   Body mass index is 39.45 kg/m. No exam data present  General: well developed, pleasant middle-aged male, well nourished, seated, in no evident distress (patient falling asleep during appointment while having conversation with wife) Head: head normocephalic and atraumatic.   Neck: supple with no carotid or supraclavicular bruits Cardiovascular: regular rate and rhythm, no murmurs Musculoskeletal: no deformity Skin:  no rash/petichiae Vascular:  Normal pulses all extremities  Neurologic Exam Mental Status: Awake and fully alert.  No dysarthria present.  Oriented to place and time. Recent and remote memory intact. Attention span, concentration and fund of knowledge appropriate. Mood and affect appropriate.  Cranial Nerves: Fundoscopic exam reveals sharp disc margins. Pupils equal, briskly reactive to light. Extraocular movements full without nystagmus. Visual fields full to confrontation. Hearing intact. Facial sensation intact.  Minor right facial paralysis. Motor: 5/5 strength in all extremities tested  Sensory.: intact to touch , pinprick , position and vibratory sensation.  Coordination: Rapid alternating movements normal in all extremities. Finger-to-nose and heel-to-shin performed accurately bilaterally.   Gait and Station: Arises from chair without difficulty. Stance is normal. Gait demonstrates normal stride but does have stiffness in right leg while ambulating.  Able to heel, toe and tandem walk with mild difficulty.  Positive Romberg. Reflexes: 1+ and symmetric. Toes downgoing.    Diagnostic Data (Labs, Imaging, Testing)  MRA HEAD WO CONTRAST 02/03/18 IMPRESSION:  MRA head (without) demonstrating: - Hypoplastic left vertebral artery with focal area of decreased flow signal in the distal  segment.  While this can be seen with focal atherosclerosis, this more likely represents area of technical artifact, especially when correlated with normal-appearing flow signal on MRA neck from same day. - Remainder of medium to large sized intracranial vessels appear unremarkable.  MRA NECK WO CONTRAST 02/03/18 IMPRESSION:  Normal MRA neck (with and without).   Ct Head Wo Contrast Result Date: 12/16/2017  IMPRESSION: 1. No evidence of acute intracranial abnormality. 2. Mild chronic small vessel ischemic disease.   [name] Result Date: 12/16/2017 IMPRESSION: 1 cm acute infarct left pons Mild to moderate chronic microvascular ischemic changes in the cerebral white matter bilaterally. Electronically Signed      Echocardiogram:      12/17/2017                                     Study Conclusions - Left ventricle: The cavity size was normal. Wall thickness was increased in a pattern of moderate LVH. Systolic function was normal. The estimated ejection fraction was in the range of 55% to 60%. Wall motion was normal; there were no regional wall motion abnormalities. Doppler parameters are consistent with abnormal left ventricular relaxation (grade 1 diastolic dysfunction). - Aortic valve: There was mild regurgitation. Valve area (VTI): 3.48 cm^2. Valve area (Vmax): 3.49 cm^2. Valve area (Vmean): 3.31 cm^2. - Aortic root: The aortic root was mildly dilated. - Left atrium: The atrium was moderately to severely dilated.  B/L Carotid U/S:   12/17/2017                                         Final Interpretation: Right Carotid: Velocities in the right ICA are consistent with a 1-39% stenosis. Left Carotid: Velocities in the left ICA are consistent with a 1-39% stenosis. Vertebrals: Both vertebral arteries were patent with antegrade flow. Subclavians: Normal flow hemodynamics were seen in bilateral subclavian arteries.    ASSESSMENT: Jonathan Miles is a  57 y.o. 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 Vascular risk factors include HTN, HLD, DM, and CAD.  Patient returns today for 2-week follow-up for possible return to work but at this time it was determined that patient remains unsafe to return to work full-time or even part-time without limitations due to safety reasons.    PLAN: -Continue aspirin 325 mg daily  and lipitor  for secondary stroke prevention -F/u with PCP regarding your HTN, HLD and DM management -Increase Zoloft 100 mg to Zoloft 50 mg for increased depression and anxiety -continue to monitor BP at home -As patient was unable to return to work part-time with limitations, it is recommended that patient continues to stay  out of work at this time due to safety concerns with increased fatigue which worsens stroke  -provided patient and wife with vocational rehab resources and advised him to call if they have additional questions -patient is also questioning at this time whether he should be taking an early retirement or finding a job that he is able to do at this time -Maintain strict control of hypertension with blood pressure goal below 130/90, diabetes with hemoglobin A1c goal below 6.5% and cholesterol with LDL cholesterol (bad cholesterol) goal below 70 mg/dL. I also advised the patient to eat a healthy diet with plenty of whole grains, cereals, fruits and vegetables, exercise regularly and maintain ideal body weight.  Follow up in 3 months or call earlier if needed  Greater than 50% time during this 25 minute consultation visit was spent on counseling and coordination of care about HLD, HTN and DM, discussion about risk benefit of anticoagulation and answering questions.   Venancio Poisson, AGNP-BC  [facility] [address] [place], [place] [address]  Phone [phone] Fax [phone]

## 2018-03-10 NOTE — Patient Instructions (Addendum)
Continue aspirin 325 mg daily  and lipitor  for secondary stroke prevention  Increased Zoloft from 100mg  to 150mg  daily (take 1.5 tablets daily at night)  Continue to follow up with PCP regarding cholesterol and blood pressure management   Continue to be compliant with CPAP for sleep apnea  Provided you with resources for vocational rehab options - unable to release you to go back to work full time or even part time without restrictions due to increased fatigue with minimal exertion in which increases stroke deficits  [email or web address] - please visit site as there are many good resources for vocational rehab in the [place], [place] area  Maintain strict control of hypertension with blood pressure goal below 130/90, diabetes with hemoglobin A1c goal below 6.5% and cholesterol with LDL cholesterol (bad cholesterol) goal below 70 mg/dL. I also advised the patient to eat a healthy diet with plenty of whole grains, cereals, fruits and vegetables, exercise regularly and maintain ideal body weight.  Followup in the future with me in 3 months or call earlier if needed       Thank you for coming to see [place] at [facility]. I hope we have been able to provide you high quality care today.  You may receive a patient satisfaction survey over the next few weeks. We would appreciate your feedback and comments so that we may continue to improve ourselves and the health of our patients.

## 2018-03-11 NOTE — Progress Notes (Signed)
Letter concerning pt not returning to work fax to [phone]. Letter fax twice, and confirmed. PT will be out of work till 06/10/2018. Pt has appt on that day for work evaluation.Letter fax twice and confirmed.

## 2018-03-16 ENCOUNTER — Encounter: Payer: BLUE CROSS/BLUE SHIELD | Admitting: Physician Assistant

## 2018-03-17 ENCOUNTER — Encounter: Payer: Self-pay | Admitting: Adult Health

## 2018-03-17 ENCOUNTER — Encounter: Payer: Self-pay | Admitting: Physician Assistant

## 2018-03-17 ENCOUNTER — Ambulatory Visit (INDEPENDENT_AMBULATORY_CARE_PROVIDER_SITE_OTHER): Payer: BLUE CROSS/BLUE SHIELD | Admitting: Physician Assistant

## 2018-03-17 VITALS — BP 138/80 | HR 86 | Temp 99.2°F | Resp 16 | Ht 75.0 in | Wt 309.4 lb

## 2018-03-17 DIAGNOSIS — I10 Essential (primary) hypertension: Secondary | ICD-10-CM

## 2018-03-17 DIAGNOSIS — Z6836 Body mass index (BMI) 36.0-36.9, adult: Secondary | ICD-10-CM | POA: Diagnosis not present

## 2018-03-17 DIAGNOSIS — E119 Type 2 diabetes mellitus without complications: Secondary | ICD-10-CM | POA: Diagnosis not present

## 2018-03-17 DIAGNOSIS — E785 Hyperlipidemia, unspecified: Secondary | ICD-10-CM | POA: Diagnosis not present

## 2018-03-17 DIAGNOSIS — R5383 Other fatigue: Secondary | ICD-10-CM

## 2018-03-17 DIAGNOSIS — I6930 Unspecified sequelae of cerebral infarction: Secondary | ICD-10-CM | POA: Diagnosis not present

## 2018-03-17 DIAGNOSIS — F4323 Adjustment disorder with mixed anxiety and depressed mood: Secondary | ICD-10-CM

## 2018-03-17 NOTE — Patient Instructions (Addendum)
Vocational Rehabilitation Services [phone]  Go ahead and call [place] to schedule your next visit with me there. [phone].   IF you received an x-ray today, you will receive an invoice from [facility]. Please contact [facility] at [phone] with questions or concerns regarding your invoice.   IF you received labwork today, you will receive an invoice from [facility]. Please contact [facility] at [phone] with questions or concerns regarding your invoice.   Our billing staff will not be able to assist you with questions regarding bills from these companies.  You will be contacted with the lab results as soon as they are available. The fastest way to get your results is to activate your My Chart account. Instructions are located on the last page of this paperwork. If you have not heard from [place] regarding the results in 2 weeks, please contact this office.

## 2018-03-17 NOTE — Progress Notes (Signed)
This encounter was created in error - please disregard.

## 2018-03-17 NOTE — Progress Notes (Signed)
Patient ID: Jonathan Miles, male    DOB: 1961-10-01, 57 y.o.   MRN: [id number]  PCP: Harrison Mons, PA-C  Chief Complaint  Patient presents with  . Diabetes    follow up   . Hyperlipidemia    follow up  and labs     Subjective:   Presents for evaluation of diabetes, hyperlipidemia and hypertension.  He is accompanied by his wife.  Tolerating medications without adverse effects. Has fasted all day, feels mild nausea and mildly dizzy at present as a result.  Fatigue continues. Significant. CPAP seal improved with recent strap adjustments. Seems to have improved the fatigue a little. Lucky to get 5 hours of sleep now. Feels restless. Naps during the day, 2-4 hours. Falls asleep quickly, any time he is not engaged in something. Does best in the mornings, so tries to get his activities done then. Even minimal exertion results in extreme fatigue, requiring rest. His PT has encouraged him to work on building increased stamina/exercise tolerance.  Neurology visit 5/15, sertraline dose increased and changed to HS, in hopes of alleviating some of his somnolence/fatigue.  Last labs 11/2017. A1C 5.5% LDL 105  Needs to schedule eye exam.  Doesn't check sugars at home. Mild dry mouth at night, trying not to drink as much in the evenings. Uses humidified air with CPAP.  Dizziness. "A little," with rapid position changes. Usually resolves quickly, may take several minutes.  Review of Systems As above. No chest pain, shortness of breath, blurred vision, dizziness. Strength continues to improve.    Patient Active Problem List   Diagnosis Date Noted  . Late effect of stroke 12/26/2017  . History of stroke 12/16/2017  . BMI 36.0-36.9,adult 08/05/2017  . Cholelithiasis 08/05/2017  . Bilateral plantar fasciitis 08/05/2017  . Situational mixed anxiety and depressive disorder 08/05/2017  . Bilateral primary osteoarthritis of knee 07/01/2017  . Asthma 01/26/2017  .  Overactive bladder 01/21/2017  . BPH with obstruction/lower urinary tract symptoms 01/21/2017  . S/P appendectomy 01/07/2017  . Diverticulosis 11/24/2016  . Type 2 diabetes mellitus without complication, without long-term current use of insulin ([place]) 10/31/2016  . Aortic insufficiency 11/23/2012  . Chronic diastolic CHF (congestive heart failure) ([place]) 11/23/2012  . CAD (coronary artery disease) 11/27/2011  . OSA (obstructive sleep apnea) 11/11/2011  . Aortic root dilatation ([place])   . Hyperlipidemia 01/23/2011  . Hypertension 01/23/2011    Prior to Admission medications   Medication Sig Start Date End Date Taking? Authorizing Provider  acetaminophen (TYLENOL) 500 MG tablet TAKE 1-2 TABS EVERY 6 HOURS AS NEEDED FOR PAIN 05/26/12  Yes Richardson Dopp T, PA-C  aspirin 325 MG EC tablet Take 1 tablet (325 mg total) by mouth at bedtime. 02/20/18  Yes Damarious Holtsclaw, PA-C  atorvastatin (LIPITOR) 80 MG tablet Take 1 tablet (80 mg total) by mouth daily at 6 PM. 01/21/18  Yes Cian Costanzo, PA-C  carvedilol (COREG) 12.5 MG tablet TAKE ONE TABLET BY MOUTH TWO TIMES A DAY 01/04/18  Yes Candy Leverett, PA-C  felodipine (PLENDIL) 10 MG 24 hr tablet TAKE ONE TABLET BY MOUTH DAILY 11/03/17  Yes Anija Brickner, PA-C  furosemide (LASIX) 40 MG tablet TAKE ONE TABLET BY MOUTH DAILY 11/03/17  Yes Tank Difiore, PA-C  lidocaine (XYLOCAINE) 5 % ointment Apply 1 application topically as needed. Daily for leg pain 01/23/18  Yes Arnika Larzelere, PA-C  lisinopril (PRINIVIL,ZESTRIL) 20 MG tablet TAKE ONE TABLET BY MOUTH DAILY 11/03/17  Yes Richrd Kuzniar, PA-C  loratadine (CLARITIN)  10 MG tablet Take 10 mg by mouth at bedtime.    Yes [provider]  metFORMIN (GLUCOPHAGE) 1000 MG tablet Take 1 tablet (1,000 mg total) by mouth 2 (two) times daily with a meal. 01/21/18  Yes Anayely Constantine, PA-C  mirabegron ER (MYRBETRIQ) 50 MG TB24 tablet Take 50 mg by mouth at bedtime.    Yes [provider]  sertraline  (ZOLOFT) 100 MG tablet Take 1.5 tablets (150 mg total) by mouth daily. 03/10/18  Yes Venancio Poisson, NP  tamsulosin (FLOMAX) 0.4 MG CAPS capsule Take 2 capsules (0.8 mg total) by mouth daily after supper. 09/15/17  Yes Harrison Mons, PA-C    Allergies  Allergen Reactions  . Ibuprofen Other (See Comments)    TOLD NOT TO TAKE-PER CARDS  . Iodinated Diagnostic Agents Hives    Hives started 4 days after cta chest was performed, minimal relief w/ benadryl x 3 days, uncertain if reaction was actually iv contrast or other allergen,allergy tests to follow.wife will make [place] aware of results//a.[email or web address]       Objective:  Physical Exam  Constitutional: He is oriented to person, place, and time. He appears well-developed and well-nourished. He is active and cooperative. No distress.  BP 138/80   Pulse 86   Temp 99.2 F (37.3 C)   Resp 16   Ht 6\' 3"  (1.905 m)   Wt (!) 309 lb 6.4 oz (140.3 kg)   SpO2 94%   BMI 38.67 kg/m   HENT:  Head: Normocephalic and atraumatic.  Right Ear: Hearing normal.  Left Ear: Hearing normal.  Eyes: Conjunctivae are normal. No scleral icterus.  Neck: Normal range of motion. Neck supple. No thyromegaly present.  Cardiovascular: Normal rate, regular rhythm and normal heart sounds.  Pulses:      Radial pulses are 2+ on the right side, and 2+ on the left side.  Pulmonary/Chest: Effort normal and breath sounds normal.  Lymphadenopathy:       Head (right side): No tonsillar, no preauricular, no posterior auricular and no occipital adenopathy present.       Head (left side): No tonsillar, no preauricular, no posterior auricular and no occipital adenopathy present.    He has no cervical adenopathy.       Right: No supraclavicular adenopathy present.       Left: No supraclavicular adenopathy present.  Neurological: He is alert and oriented to person, place, and time. No sensory deficit.  Skin: Skin is warm, dry and intact. No rash noted. No cyanosis or erythema.  Nails show no clubbing.  Psychiatric: He has a normal mood and affect. His speech is normal and behavior is normal.    Wt Readings from Last 3 Encounters:  03/17/18 (!) 309 lb 6.4 oz (140.3 kg)  03/10/18 (!) 315 lb 9.6 oz (143.2 kg)  02/24/18 (!) 309 lb 6.4 oz (140.3 kg)       Assessment & Plan:   Problem List Items Addressed This Visit    Type 2 diabetes mellitus without complication, without long-term current use of insulin ([place]) - Primary    Has been well controlled.  Anticipate the same on today's check.      Relevant Orders   Comprehensive metabolic panel (Completed)   Hemoglobin A1c (Completed)   Microalbumin / creatinine urine ratio (Completed)   HM DIABETES EYE EXAM (Completed)   HM DIABETES FOOT EXAM (Completed)   Situational mixed anxiety and depressive disorder    Continued frustration over fatigue and poor exercise tolerance. Continue  sertraline 150 mg daily.      Late effect of stroke    RIGHT sided weakness continues to improve, but he is not yet to baseline and has not been released to RTW.       Hypertension    Well-controlled.  Continue current regimen of carvedilol, felodipine, furosemide, lisinopril.      Relevant Orders   CBC with Differential/Platelet (Completed)   Comprehensive metabolic panel (Completed)   TSH (Completed)   Hyperlipidemia    LDL was up a little.  Goal LDL is less than 70.  Await lipid profile today.      Relevant Orders   Comprehensive metabolic panel (Completed)   Lipid panel (Completed)   BMI 36.0-36.9,adult    Encouraged continued efforts for weight loss.  His fatigue makes exercise quite difficult.  Continue to explore causes of his extreme fatigue and improving exercise tolerance.       Other Visit Diagnoses    Fatigue, unspecified type       Relevant Orders   Testosterone (Completed)       Return in about 3 months (around 06/17/2018) for re-evaluation of diabetes, blood pressure, cholesterol, fatigue.   Fara Chute, PA-C [facility]

## 2018-03-18 LAB — CBC WITH DIFFERENTIAL/PLATELET
Basophils Absolute: 0.1 10*3/uL (ref 0.0–0.2)
Basos: 1 %
EOS (ABSOLUTE): 0.2 10*3/uL (ref 0.0–0.4)
Eos: 2 %
Hematocrit: 38.6 % (ref 37.5–51.0)
Hemoglobin: 13.3 g/dL (ref 13.0–17.7)
Immature Grans (Abs): 0 10*3/uL (ref 0.0–0.1)
Immature Granulocytes: 0 %
Lymphocytes Absolute: 1.3 10*3/uL (ref 0.7–3.1)
Lymphs: 14 %
MCH: 28.6 pg (ref 26.6–33.0)
MCHC: 34.5 g/dL (ref 31.5–35.7)
MCV: 83 fL (ref 79–97)
Monocytes Absolute: 0.6 10*3/uL (ref 0.1–0.9)
Monocytes: 7 %
Neutrophils Absolute: 7.3 10*3/uL — ABNORMAL HIGH (ref 1.4–7.0)
Neutrophils: 76 %
Platelets: 307 10*3/uL (ref 150–450)
RBC: 4.65 x10E6/uL (ref 4.14–5.80)
RDW: 14.7 % (ref 12.3–15.4)
WBC: 9.5 10*3/uL (ref 3.4–10.8)

## 2018-03-18 LAB — COMPREHENSIVE METABOLIC PANEL
ALT: 14 IU/L (ref 0–44)
AST: 7 IU/L (ref 0–40)
Albumin/Globulin Ratio: 1.8 (ref 1.2–2.2)
Albumin: 4.2 g/dL (ref 3.5–5.5)
Alkaline Phosphatase: 79 IU/L (ref 39–117)
BUN/Creatinine Ratio: 14 (ref 9–20)
BUN: 12 mg/dL (ref 6–24)
Bilirubin Total: 0.4 mg/dL (ref 0.0–1.2)
CO2: 25 mmol/L (ref 20–29)
Calcium: 9 mg/dL (ref 8.7–10.2)
Chloride: 106 mmol/L (ref 96–106)
Creatinine, Ser: 0.84 mg/dL (ref 0.76–1.27)
GFR calc Af Amer: 112 mL/min/{1.73_m2} (ref >59)
GFR calc non Af Amer: 97 mL/min/{1.73_m2} (ref >59)
Globulin, Total: 2.4 g/dL (ref 1.5–4.5)
Glucose: 89 mg/dL (ref 65–99)
Potassium: 3.8 mmol/L (ref 3.5–5.2)
Sodium: 145 mmol/L — ABNORMAL HIGH (ref 134–144)
Total Protein: 6.6 g/dL (ref 6.0–8.5)

## 2018-03-18 LAB — MICROALBUMIN / CREATININE URINE RATIO
Creatinine, Urine: 140.5 mg/dL
Microalb/Creat Ratio: 9.6 mg/g creat (ref 0.0–30.0)
Microalbumin, Urine: 13.5 ug/mL

## 2018-03-18 LAB — LIPID PANEL
Chol/HDL Ratio: 3.2 ratio (ref 0.0–5.0)
Cholesterol, Total: 142 mg/dL (ref 100–199)
HDL: 44 mg/dL (ref >39)
LDL Calculated: 83 mg/dL (ref 0–99)
Triglycerides: 75 mg/dL (ref 0–149)
VLDL Cholesterol Cal: 15 mg/dL (ref 5–40)

## 2018-03-18 LAB — TSH: TSH: 1.42 u[IU]/mL (ref 0.450–4.500)

## 2018-03-18 LAB — TESTOSTERONE: Testosterone: 269 ng/dL (ref 264–916)

## 2018-03-18 LAB — HEMOGLOBIN A1C
Est. average glucose Bld gHb Est-mCnc: 120 mg/dL
Hgb A1c MFr Bld: 5.8 % — ABNORMAL HIGH (ref 4.8–5.6)

## 2018-03-19 ENCOUNTER — Telehealth: Payer: Self-pay

## 2018-03-19 NOTE — Telephone Encounter (Signed)
Healthcare Provider Verification form done by Janett Billow Np. Fax to  [facility] at [phone]. Fax twice and confirmed.Pt paid fee of 50.00 dollars. SEnt to medical records.

## 2018-03-21 NOTE — Assessment & Plan Note (Signed)
Well-controlled.  Continue current regimen of carvedilol, felodipine, furosemide, lisinopril.

## 2018-03-21 NOTE — Assessment & Plan Note (Signed)
Continued frustration over fatigue and poor exercise tolerance. Continue sertraline 150 mg daily.

## 2018-03-21 NOTE — Assessment & Plan Note (Signed)
RIGHT sided weakness continues to improve, but he is not yet to baseline and has not been released to RTW.

## 2018-03-21 NOTE — Assessment & Plan Note (Signed)
Has been well controlled.  Anticipate the same on today's check.

## 2018-03-21 NOTE — Assessment & Plan Note (Signed)
Encouraged continued efforts for weight loss.  His fatigue makes exercise quite difficult.  Continue to explore causes of his extreme fatigue and improving exercise tolerance.

## 2018-03-21 NOTE — Assessment & Plan Note (Signed)
LDL was up a little.  Goal LDL is less than 70.  Await lipid profile today.

## 2018-03-23 DIAGNOSIS — Z0289 Encounter for other administrative examinations: Secondary | ICD-10-CM

## 2018-03-24 ENCOUNTER — Encounter: Payer: Self-pay | Admitting: Physician Assistant

## 2018-03-26 ENCOUNTER — Telehealth: Payer: Self-pay | Admitting: *Deleted

## 2018-03-26 ENCOUNTER — Encounter: Payer: Self-pay | Admitting: Adult Health

## 2018-03-26 NOTE — Telephone Encounter (Signed)
Pt [employer] form faxed on 5/24/ and 03/26/18.

## 2018-03-29 NOTE — Telephone Encounter (Signed)
Copy of pt form mailed out today.

## 2018-03-31 ENCOUNTER — Encounter: Payer: Self-pay | Admitting: Adult Health

## 2018-04-23 ENCOUNTER — Encounter: Payer: Self-pay | Admitting: Adult Health

## 2018-04-23 ENCOUNTER — Encounter: Payer: Self-pay | Admitting: Physician Assistant

## 2018-04-27 ENCOUNTER — Telehealth: Payer: Self-pay | Admitting: Adult Health

## 2018-04-27 NOTE — Telephone Encounter (Signed)
Patient stopped by the office and paid the $50 fee for the form to be filled out.

## 2018-04-27 NOTE — Telephone Encounter (Signed)
LVM for patient to return the call to make $50 payment prior to short term disability forms thru [employer] to be filled out.

## 2018-04-28 ENCOUNTER — Telehealth: Payer: Self-pay | Admitting: Adult Health

## 2018-04-28 DIAGNOSIS — Z0289 Encounter for other administrative examinations: Secondary | ICD-10-CM

## 2018-04-28 NOTE — Telephone Encounter (Signed)
Left VM for patient to call us back to schedule an appointment w/ Janett Billow NP for disability forms.

## 2018-04-28 NOTE — Telephone Encounter (Signed)
Noted! Thank you

## 2018-05-03 ENCOUNTER — Encounter: Payer: Self-pay | Admitting: Adult Health

## 2018-05-03 ENCOUNTER — Ambulatory Visit (INDEPENDENT_AMBULATORY_CARE_PROVIDER_SITE_OTHER): Payer: BLUE CROSS/BLUE SHIELD | Admitting: Adult Health

## 2018-05-03 VITALS — BP 171/92 | HR 72 | Wt 310.0 lb

## 2018-05-03 DIAGNOSIS — Z9989 Dependence on other enabling machines and devices: Secondary | ICD-10-CM | POA: Diagnosis not present

## 2018-05-03 DIAGNOSIS — I10 Essential (primary) hypertension: Secondary | ICD-10-CM | POA: Diagnosis not present

## 2018-05-03 DIAGNOSIS — R519 Headache, unspecified: Secondary | ICD-10-CM

## 2018-05-03 DIAGNOSIS — R51 Headache: Secondary | ICD-10-CM

## 2018-05-03 DIAGNOSIS — E785 Hyperlipidemia, unspecified: Secondary | ICD-10-CM

## 2018-05-03 DIAGNOSIS — G4733 Obstructive sleep apnea (adult) (pediatric): Secondary | ICD-10-CM

## 2018-05-03 DIAGNOSIS — Z8673 Personal history of transient ischemic attack (TIA), and cerebral infarction without residual deficits: Secondary | ICD-10-CM

## 2018-05-03 MED ORDER — BLOOD PRESSURE MONITOR/L CUFF MISC: 0 refills | Status: DC

## 2018-05-03 NOTE — Patient Instructions (Addendum)
Continue aspirin 325 mg daily  and lipitor  for secondary stroke prevention  Headache control - tylenol 500mg  as needed - okay to take an additional tablet if needed; cold compress; neck exercises; stress reduction  We will call you to schedule appointment with sleep provider   Continue to follow up with PCP regarding cholesterol, blood pressure and depression management   Continue to monitor blood pressure at home - script was provided for BP cuff  We will fill out the paperwork for disability forms until August 20th  Maintain strict control of hypertension with blood pressure goal below 130/90, diabetes with hemoglobin A1c goal below 6.5% and cholesterol with LDL cholesterol (bad cholesterol) goal below 70 mg/dL. I also advised the patient to eat a healthy diet with plenty of whole grains, cereals, fruits and vegetables, exercise regularly and maintain ideal body weight.  Followup in the future with me in 6 months or call earlier if needed        Thank you for coming to see [place] at [facility]. I hope we have been able to provide you high quality care today.  You may receive a patient satisfaction survey over the next few weeks. We would appreciate your feedback and comments so that we may continue to improve ourselves and the health of our patients.         Mindfulness-Based Stress Reduction Mindfulness-based stress reduction (MBSR) is a program that helps people learn to practice mindfulness. Mindfulness is the practice of intentionally paying attention to the present moment. It can be learned and practiced through techniques such as education, breathing exercises, meditation, and yoga. MBSR includes several mindfulness techniques in one program. MBSR works best when you understand the treatment, are willing to try new things, and can commit to spending time practicing what you learn. MBSR training may include learning about:  How your emotions, thoughts, and  reactions affect your body.  New ways to respond to things that cause negative thoughts to start (triggers).  How to notice your thoughts and let go of them.  Practicing awareness of everyday things that you normally do without thinking.  The techniques and goals of different types of meditation.  What are the benefits of MBSR? MBSR can have many benefits, which include helping you to:  Develop self-awareness. This refers to knowing and understanding yourself.  Learn skills and attitudes that help you to participate in your own health care.  Learn new ways to care for yourself.  Be more accepting about how things are, and let things go.  Be less judgmental and approach things with an open mind.  Be patient with yourself and trust yourself more.  MBSR has also been shown to:  Reduce negative emotions, such as depression and anxiety.  Improve memory and focus.  Change how you sense and approach pain.  Boost your body's ability to fight infections.  Help you connect better with other people.  Improve your sense of well-being.  Follow these instructions at home:  Find a local in-person or online MBSR program.  Set aside some time regularly for mindfulness practice.  Find a mindfulness practice that works best for you. This may include one or more of the following: ? Meditation. Meditation involves focusing your mind on a certain thought or activity. ? Breathing awareness exercises. These help you to stay present by focusing on your breath. ? Body scan. For this practice, you lie down and pay attention to each part of your body from head  to toe. You can identify tension and soreness and intentionally relax parts of your body. ? Yoga. Yoga involves stretching and breathing, and it can improve your ability to move and be flexible. It can also provide an experience of testing your body's limits, which can help you release stress. ? Mindful eating. This way of eating involves  focusing on the taste, texture, color, and smell of each bite of food. Because this slows down eating and helps you feel full sooner, it can be an important part of a weight-loss plan.  Find a podcast or recording that provides guidance for breathing awareness, body scan, or meditation exercises. You can listen to these any time when you have a free moment to rest without distractions.  Follow your treatment plan as told by your health care provider. This may include taking regular medicines and making changes to your diet or lifestyle as recommended. How to practice mindfulness To do a basic awareness exercise:  Find a comfortable place to sit.  Pay attention to the present moment. Observe your thoughts, feelings, and surroundings just as they are.  Avoid placing judgment on yourself, your feelings, or your surroundings. Make note of any judgment that comes up, and let it go.  Your mind may wander, and that is okay. Make note of when your thoughts drift, and return your attention to the present moment.  To do basic mindfulness meditation:  Find a comfortable place to sit. This may include a stable chair or a firm floor cushion. ? Sit upright with your back straight. Let your arms fall next to your side with your hands resting on your legs. ? If sitting in a chair, rest your feet flat on the floor. ? If sitting on a cushion, cross your legs in front of you.  Keep your head in a neutral position with your chin dropped slightly. Relax your jaw and rest the tip of your tongue on the roof of your mouth. Drop your gaze to the floor. You can close your eyes if you like.  Breathe normally and pay attention to your breath. Feel the air moving in and out of your nose. Feel your belly expanding and relaxing with each breath.  Your mind may wander, and that is okay. Make note of when your thoughts drift, and return your attention to your breath.  Avoid placing judgment on yourself, your feelings,  or your surroundings. Make note of any judgment or feelings that come up, let them go, and bring your attention back to your breath.  When you are ready, lift your gaze or open your eyes. Pay attention to how your body feels after the meditation.  Where to find more information: You can find more information about MBSR from:  Your health care provider.  Community-based meditation centers or programs.  Programs offered near you.  Summary  Mindfulness-based stress reduction (MBSR) is a program that teaches you how to intentionally pay attention to the present moment. It is used with other treatments to help you cope better with daily stress, emotions, and pain.  MBSR focuses on developing self-awareness, which allows you to respond to life stress without judgment or negative emotions.  MBSR programs may involve learning different mindfulness practices, such as breathing exercises, meditation, yoga, body scan, or mindful eating. Find a mindfulness practice that works best for you, and set aside time for it on a regular basis. This information is not intended to replace advice given to you by your health care provider.  Make sure you discuss any questions you have with your health care provider. Document Released: 02/19/2017 Document Revised: 02/19/2017 Document Reviewed: 02/19/2017 Elsevier Interactive Patient Education  2018 [place].      Neck Exercises Neck exercises can be important for many reasons:  They can help you to improve and maintain flexibility in your neck. This can be especially important as you age.  They can help to make your neck stronger. This can make movement easier.  They can reduce or prevent neck pain.  They may help your upper back.  Ask your health care provider which neck exercises would be best for you. Exercises Neck Press Repeat this exercise 10 times. Do it first thing in the morning and right before bed or as told by your health care  provider. 1. Lie on your back on a firm bed or on the floor with a pillow under your head. 2. Use your neck muscles to push your head down on the pillow and straighten your spine. 3. Hold the position as well as you can. Keep your head facing up and your chin tucked. 4. Slowly count to 5 while holding this position. 5. Relax for a few seconds. Then repeat.  Isometric Strengthening Do a full set of these exercises 2 times a day or as told by your health care provider. 1. Sit in a supportive chair and place your hand on your forehead. 2. Push forward with your head and neck while pushing back with your hand. Hold for 10 seconds. 3. Relax. Then repeat the exercise 3 times. 4. Next, do thesequence again, this time putting your hand against the back of your head. Use your head and neck to push backward against the hand pressure. 5. Finally, do the same exercise on either side of your head, pushing sideways against the pressure of your hand.  Prone Head Lifts Repeat this exercise 5 times. Do this 2 times a day or as told by your health care provider. 1. Lie face-down, resting on your elbows so that your chest and upper back are raised. 2. Start with your head facing downward, near your chest. Position your chin either on or near your chest. 3. Slowly lift your head upward. Lift until you are looking straight ahead. Then continue lifting your head as far back as you can stretch. 4. Hold your head up for 5 seconds. Then slowly lower it to your starting position.  Supine Head Lifts Repeat this exercise 8-10 times. Do this 2 times a day or as told by your health care provider. 1. Lie on your back, bending your knees to point to the ceiling and keeping your feet flat on the floor. 2. Lift your head slowly off the floor, raising your chin toward your chest. 3. Hold for 5 seconds. 4. Relax and repeat.  Scapular Retraction Repeat this exercise 5 times. Do this 2 times a day or as told by your health  care provider. 1. Stand with your arms at your sides. Look straight ahead. 2. Slowly pull both shoulders backward and downward until you feel a stretch between your shoulder blades in your upper back. 3. Hold for 10-30 seconds. 4. Relax and repeat.  Contact a health care provider if:  Your neck pain or discomfort gets much worse when you do an exercise.  Your neck pain or discomfort does not improve within 2 hours after you exercise. If you have any of these problems, stop exercising right away. Do not do the exercises again  unless your health care provider says that you can. Get help right away if:  You develop sudden, severe neck pain. If this happens, stop exercising right away. Do not do the exercises again unless your health care provider says that you can. Exercises Neck Stretch  Repeat this exercise 3-5 times. 1. Do this exercise while standing or while sitting in a chair. 2. Place your feet flat on the floor, shoulder-width apart. 3. Slowly turn your head to the right. Turn it all the way to the right so you can look over your right shoulder. Do not tilt or tip your head. 4. Hold this position for 10-30 seconds. 5. Slowly turn your head to the left, to look over your left shoulder. 6. Hold this position for 10-30 seconds.  Neck Retraction Repeat this exercise 8-10 times. Do this 3-4 times a day or as told by your health care provider. 1. Do this exercise while standing or while sitting in a sturdy chair. 2. Look straight ahead. Do not bend your neck. 3. Use your fingers to push your chin backward. Do not bend your neck for this movement. Continue to face straight ahead. If you are doing the exercise properly, you will feel a slight sensation in your throat and a stretch at the back of your neck. 4. Hold the stretch for 1-2 seconds. Relax and repeat.  This information is not intended to replace advice given to you by your health care provider. Make sure you discuss any  questions you have with your health care provider. Document Released: 09/24/2015 Document Revised: 03/20/2016 Document Reviewed: 04/23/2015 Elsevier Interactive Patient Education  [employer].

## 2018-05-03 NOTE — Progress Notes (Signed)
I agree with the above plan 

## 2018-05-03 NOTE — Progress Notes (Addendum)
[facility] [address] [place]. [place] [address] [phone]       OFFICE FOLLOW UP NOTE  [name] Date of Birth:  Aug 16, 1961 Medical Record Number:  [id number]   Reason for Referral:  hospital stroke follow up  CHIEF COMPLAINT:  Chief Complaint  Patient presents with  . 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 History obtained from patient and chart review. Reviewed all radiology images and labs personally.  Jonathan Miles is a 57 year old male with PMH of DM, HTN, HLD, OSA on CPAP and CAD who presented with acute onset of dizziness, slurred speech and ataxia on 12/16/2017.  CT head reviewed and showed no intracranial abnormality.  MRI brain reviewed and did show an acute infarct in the left pons.  Bilateral carotid ultrasound performed and showed bilateral ICA stenosis of 1-39%.  2D echo performed and showed an EF of 55-60% with moderate LVH. LDL 82 and was recommended to increase Lipitor from 40 mg to 80 mg.  A1c 5.5.  Patient was previously on aspirin 81 mg and was recommended to increase his to aspirin 325 mg daily.  Patient was discharged home in stable condition. Since discharge, patient has been participating in PT/OT.  He is accompanied at today's appointment with his wife.  He completed OT but continues PT 2 times per week.  He is making great improvement but does continue to exhibit some right hemiplegia.  He does complain about frequent fatigue especially after PT sessions or with increased activity.  He does continue to take aspirin without increase in bruising or bleeding.  Continues to take Lipitor with mild aches but able to tolerate them and these are improving.  Today's blood pressure satisfactory at 135/87.  Patient does have OSA and is compliant with CPAP machine.   He does have complaints of a headache that occurs on a weekly basis that has been occurring for the past 2 years.  He relates these to stress.  They start in the back of his neck and go up into his head and describes it as pressure.  Does have mild photophobia and phonophobia but denies nausea and vomiting.  Does take Tylenol which does seem to relieve these headaches.  He does continue to take Zoloft which has been helping with depression and anxiety.  As patient is employed doing housing maintenance, which consists of heavy lifting, frequent walking, frequent climbing, and a lot of driving, it would not be safe for him to return to work at this time due to continued right sided hemiplegia and balance difficulties, especially as he is unable to return to work if he has restrictions or unable to go back full-time.  It would be in the best interest of the patient if he continues PT 2 times per week and continues home exercises in order to work on balance and increase strength on right side. No new or worsening stroke/TIA symptoms.  02/24/2018 visit: Patient returns today for 45-month follow-up.  He is being reevaluated for possible return to work.  He recently completed physical therapy as he has made great improvement in his strength.  He does still have complaints of increased fatigue after moderate activity where his symptoms such as dizziness or weakness can become more pronounced.  He states  he was able to mow the yard which took him approximately 20 minutes and patient had to lay down afterwards due to extreme fatigue.  Blood pressure at today's visit 162/94 but states he has not taken his medications yet and typically 130 over 80s at home.  Continues to be compliant with CPAP for OSA.  Continues to take aspirin without side effects of bleeding or bruising.  Continues to take atorvastatin without side effects of myalgias.  Concern for patient to return to work full-time as he does have a high activity demanding  job such as lifting heavy objects or climbing on high ladders.  Concern for if  increase in fatigue does occur, he can become weaker or have episodes of dizziness.  Recommend patient states to return to work on a part-time basis only at this time.  MRA head and neck performed on 02/03/2018 which did show a normal MRA of neck but MRI of head did show possible arthrosclerosis and left vertebral artery but per report, this could also represent artifact.  All remainder medium to large sized intracranial vessels appeared unremarkable.  03/10/18 visit: Patient returns today for 2-week follow-up for possible return to work and is accompanied by his wife.  It was recommended that patient return to work part-time at previous visit but with limitations such as not climbing ladders or high elevations or lifting heavy objects.  He continues to feel increased fatigue after activities that prior to the stroke would not make him feel as fatigued.  For example, patient mowed grass this morning with a push mower and feels as though he needed to lay down.  He will start to have difficulty walking, balance issues and a headache with this increased fatigue.  He does only receive approximately 5 to 6 hours of sleep a night due to being restless and "mind going constantly".  Patient does have history of OSA and is compliant with CPAP machine.  Patient does state that he has had increased stress and depression recently due to job stress and being unable to go back to work.  Currently on Zoloft 100 mg.  Patient questioning resources regarding vocational rehab or possibly taking early retirement and would like more information or resources on these topics.  Patient continues to not be safe at this time to return to work full-time or even part-time without restrictions as patient's job consists of climbing on ladders or working at [employer] or lifting heavy objects.  As patient fatigues quickly over smaller jobs/activities, patient would  not be safe on ladders or higher elevations due to increase of difficulty walking and balance issues with increased activity and fatigue.  Denies new or worsening stroke/TIA symptoms.  05/03/18 UPDATE: Patient is being seen today for follow-up visit for patient request for short-term disability extension due to continued fatigue.  Patient states that he he has no energy to complete certain tasks especially household work or yard work and this is not like him as he was previously very active person.  He has had increased depression with anxiety and his PCP recently started him on Wellbutrin.  His BP has been increased and at today's appointment 171/92 (the patient did not take antihypertensives today) but was also elevated at last week's appointment with PCP at 160/84.  Antihypertensives were not adjusted at PCP visit due to thought of possible increased BP due to anxiety/depression.  Patient does not check BP at home as they do not have a working BP cuff.  Also complains of  new onset headaches for approximately the past 3 weeks that occur 2 times per week and are localized in the frontal region and back of head/neck area with stiffness in his neck and are accompanied by phonophobia, at times blurring of vision, and are debilitating.  Patient has taken Tylenol 500 mg which "takes the edge off" but also makes him fatigued and was told in the past to limit amount of Tylenol.  Patient does have a history of headaches and has been on Topamax in the past but was unable to tolerate Topamax.  Patient does have a history of OSA and is compliant with CPAP but has not had follow-up appointment for OSA since diagnosed in 2012.  At this time, patient feels as though he is unable to return to work due to continued excessive fatigue, headaches, and uncontrolled blood pressure denies new or worsening stroke/TIA symptoms.   ROS:   14 system review of systems performed and negative with exception of fatigue, cough, heat  intolerance, excessive thirst, excessive eating, constipation, restless leg, apnea, daytime sleepiness, snoring, incontinence of bladder, frequency of urination, urgency, joint pain, back pain, neck stiffness, memory loss, headache, weakness, agitation and depression  PMH:  Past Medical History:  Diagnosis Date  . Alcohol abuse   . Aortic root dilatation (HCC)    a. echo 4/12: EF 55-60%, mod to marked dilated ascending aortic root;   b. MRA 4/12: Aortic root 5.5 cm, dilation of left and right coronary cusps, trileaflet aortic valve // Echo 2/19: Moderate LVH, EF 55-60, normal wall motion, grade 1 diastolic dysfunction, mild AI, mildly dilated aortic root, moderate to severe LAE (aortic root 40 mm; ascending aorta 39 mm)   . Arthritis   . Asthma    ast attack in early 20's  . CAD (coronary artery disease)   . Chest pain    Myoview 4/12: EF 62%, no ischemia or scar  . CHF (congestive heart failure) ([place])   . Chronic pain    back and hands  . Constipation   . COPD (chronic obstructive pulmonary disease) ([place])   . Depression   . Diabetes mellitus    type 2  . Difficulty speaking   . Drug use   . Enlarged prostate   . Fatigue   . GERD (gastroesophageal reflux disease)    modifies with diet  . Hx of echocardiogram    Echo (2/16):  Mod LVH, EF 55-60%, mild to mod AI, mod LAE, mild RAE, Aortic root 38 mm, Asc aorta 43 mm  . Hyperlipidemia   . Hypertension   . Joint pain   . Knee pain, bilateral   . Leg pain    ABIs 3/19: Normal (R 1.3; L 1.28)  . OSA (obstructive sleep apnea) 2006   .  Wears at times  . Osteoarthritis   . Palpitations   . Stroke [facility])     PSH:  Past Surgical History:  Procedure Laterality Date  . APPENDECTOMY  12/2016  . Colonscopy    . CORONARY ARTERY BYPASS GRAFT  05/07/2012   Procedure: CORONARY ARTERY BYPASS GRAFTING (CABG);  Surgeon: Grace Isaac, MD;  Location: [place];  Service: Open Heart Surgery;  Laterality: N/A;  . KNEE ARTHROSCOPY Left 2010    Dr. Rosamaria Lints  . LAPAROSCOPIC APPENDECTOMY N/A 01/06/2017   Procedure: APPENDECTOMY LAPAROSCOPIC;  Surgeon: Ralene Ok, MD;  Location: [facility];  Service: General;  Laterality: N/A;  . Retactment of fingers     as a child , sewed  with sewing machine- Index and middle finger  . UPPER GASTROINTESTINAL ENDOSCOPY      Social History:  Social History   Socioeconomic History  . Marital status: Married    Spouse name: Jonathan Miles  . Number of children: 0  . Years of education: 12th grade  . Highest education level: Not on file  Occupational History  . 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  Social Needs  . Financial resource strain: Not on file  . Food insecurity:    Worry: Not on file    Inability: Not on file  . Transportation needs:    Medical: Not on file    Non-medical: Not on file  Tobacco Use  . Smoking status: Former Smoker    Packs/day: 1.00    Years: 13.00    Pack years: 13.00    Types: Cigarettes    Last attempt to quit: 01/23/1988    Years since quitting: 30.2  . Smokeless tobacco: Never Used  Substance and Sexual Activity  . Alcohol use: Yes    Comment: quit 1989  . Drug use: No    Comment: previous cocaine and marijuana use, quit 1988  . Sexual activity: Not Currently    Partners: Female  Lifestyle  . Physical activity:    Days per week: Not on file    Minutes per session: Not on file  . Stress: Not on file  Relationships  . Social connections:    Talks on phone: Not on file    Gets together: Not on file    Attends religious service: Not on file    Active member of club or organization: Not on file    Attends meetings of clubs or organizations: Not on file    Relationship status: Not on file  . Intimate partner violence:    Fear of current or ex partner: Not on file    Emotionally abused: Not on file    Physically abused: Not on file    Forced sexual activity: Not on file  Other Topics  Concern  . Not on file  Social History Narrative   Married, lives in [place]. Therapist, occupational for [employer].    Pt is adopted. Unsure of his family medical history.    Family History:  Family History  Adopted: Yes  Family history unknown: Yes    Medications:   Current Outpatient Medications on File Prior to Visit  Medication Sig Dispense Refill  . acetaminophen (TYLENOL) 500 MG tablet TAKE 1-2 TABS EVERY 6 HOURS AS NEEDED FOR PAIN    . aspirin 325 MG EC tablet Take 1 tablet (325 mg total) by mouth at bedtime. 90 tablet 3  . atorvastatin (LIPITOR) 80 MG tablet Take 1 tablet (80 mg total) by mouth daily at 6 PM. 90 tablet 3  . buPROPion (WELLBUTRIN XL) 150 MG 24 hr tablet Take by mouth.    . carvedilol (COREG) 12.5 MG tablet TAKE ONE TABLET BY MOUTH TWO TIMES A DAY 180 tablet 2  . felodipine (PLENDIL) 10 MG 24 hr tablet TAKE ONE TABLET BY MOUTH DAILY 90 tablet 2  . furosemide (LASIX) 40 MG tablet TAKE ONE TABLET BY MOUTH DAILY 90 tablet 2  . lidocaine (XYLOCAINE) 5 % ointment Apply 1 application topically as needed. Daily for leg pain 50 g 3  . lisinopril (PRINIVIL,ZESTRIL) 20 MG tablet TAKE ONE TABLET BY MOUTH DAILY 90 tablet 2  . loratadine (CLARITIN) 10 MG tablet  Take 10 mg by mouth at bedtime.     . metFORMIN (GLUCOPHAGE) 1000 MG tablet Take 1 tablet (1,000 mg total) by mouth 2 (two) times daily with a meal. 180 tablet 3  . mirabegron ER (MYRBETRIQ) 50 MG TB24 tablet Take 50 mg by mouth at bedtime.     . sertraline (ZOLOFT) 100 MG tablet Take 1.5 tablets (150 mg total) by mouth daily. 135 tablet 3  . tamsulosin (FLOMAX) 0.4 MG CAPS capsule Take 2 capsules (0.8 mg total) by mouth daily after supper. 180 capsule 3   No current facility-administered medications on file prior to visit.     Allergies:   Allergies  Allergen Reactions  . Ibuprofen Other (See Comments)    TOLD NOT TO TAKE-PER CARDS  . Iodinated Diagnostic Agents Hives    Hives started 4 days after cta  chest was performed, minimal relief w/ benadryl x 3 days, uncertain if reaction was actually iv contrast or other allergen,allergy tests to follow.wife will make [place] aware of results//a.calhoun     Physical Exam  Vitals:   05/03/18 0729  BP: (!) 171/92  Pulse: 72  Weight: (!) 310 lb (140.6 kg)   Body mass index is 38.75 kg/m. No exam data present  General: well developed, pleasant middle-aged male, well nourished, seated, in no evident distress but flat affect Head: head normocephalic and atraumatic.   Neck: supple with no carotid or supraclavicular bruits Cardiovascular: regular rate and rhythm, no murmurs Musculoskeletal: no deformity Skin:  no rash/petichiae Vascular:  Normal pulses all extremities  Neurologic Exam Mental Status: Awake and fully alert.  No dysarthria present.  Oriented to place and time. Recent and remote memory intact. Attention span, concentration and fund of knowledge appropriate. Mood and affect appropriate.  Cranial Nerves: Fundoscopic exam reveals sharp disc margins. Pupils equal, briskly reactive to light. Extraocular movements full without nystagmus. Visual fields full to confrontation. Hearing intact. Facial sensation intact.  Minor right facial paralysis. Motor: 5/5 strength in all extremities tested Sensory.: intact to touch , pinprick , position and vibratory sensation.  Coordination: Rapid alternating movements normal in all extremities. Finger-to-nose and heel-to-shin performed accurately bilaterally.   Gait and Station: Arises from chair without difficulty. Stance is normal. Gait demonstrates normal stride but does have stiffness in right leg while ambulating.  Able to heel, toe and tandem walk with mild difficulty.  Positive Romberg. Reflexes: 1+ and symmetric. Toes downgoing.    Diagnostic Data (Labs, Imaging, Testing)  MRA HEAD WO CONTRAST 02/03/18 IMPRESSION:  MRA head (without) demonstrating: - Hypoplastic left vertebral artery with focal  area of decreased flow signal in the distal segment.  While this can be seen with focal atherosclerosis, this more likely represents area of technical artifact, especially when correlated with normal-appearing flow signal on MRA neck from same day. - Remainder of medium to large sized intracranial vessels appear unremarkable.  MRA NECK WO CONTRAST 02/03/18 IMPRESSION:  Normal MRA neck (with and without).   Ct Head Wo Contrast Result Date: 12/16/2017  IMPRESSION: 1. No evidence of acute intracranial abnormality. 2. Mild chronic small vessel ischemic disease.   [name] Result Date: 12/16/2017 IMPRESSION: 1 cm acute infarct left pons Mild to moderate chronic microvascular ischemic changes in the cerebral white matter bilaterally. Electronically Signed      Echocardiogram:      12/17/2017  Study Conclusions - Left ventricle: The cavity size was normal. Wall thickness was increased in a pattern of moderate LVH. Systolic function was normal. The estimated ejection fraction was in the range of 55% to 60%. Wall motion was normal; there were no regional wall motion abnormalities. Doppler parameters are consistent with abnormal left ventricular relaxation (grade 1 diastolic dysfunction). - Aortic valve: There was mild regurgitation. Valve area (VTI): 3.48 cm^2. Valve area (Vmax): 3.49 cm^2. Valve area (Vmean): 3.31 cm^2. - Aortic root: The aortic root was mildly dilated. - Left atrium: The atrium was moderately to severely dilated.  B/L Carotid U/S:   12/17/2017                                         Final Interpretation: Right Carotid: Velocities in the right ICA are consistent with a 1-39% stenosis. Left Carotid: Velocities in the left ICA are consistent with a 1-39% stenosis. Vertebrals: Both vertebral arteries were patent with antegrade flow. Subclavians: Normal flow hemodynamics were seen in bilateral subclavian  arteries.    ASSESSMENT: Jonathan Miles is a 57 y.o. 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 Vascular risk factors include HTN, HLD, DM, and CAD.  Patient returns today for follow-up evaluation of short-term disability post stroke with continued fatigue, depression and headaches.    PLAN: -Continue aspirin 325 mg daily  and lipitor  for secondary stroke prevention -Headache control: Tylenol 500 mg as needed (okay to take additional tablet if needed), cold/ice compresses, neck exercises and stress reduction -F/u with PCP regarding your HTN, HLD, DM and depression management  - monitor BP at home -advised to monitor at least 2 times per day -Referral placed for sleep visit as patient does not have current OSA provider -as  patient continues to have excessive fatigue, headaches, and uncontrolled blood pressure it was recommended to not return to work at this time as patient has to return to work full-time without restrictions and this is not feasible at this time -patient does have follow-up appointment with PCP on 05/31/2018 and patient will discuss long-term disability with PCP at that time  -Maintain strict control of hypertension with blood pressure goal below 130/90, diabetes with hemoglobin A1c goal below 6.5% and cholesterol with LDL cholesterol (bad cholesterol) goal below 70 mg/dL. I also advised the patient to eat a healthy diet with plenty of whole grains, cereals, fruits and vegetables, exercise regularly and maintain ideal body weight.  Follow up in 6 months or call earlier if needed  Greater than 50% time during this 25 minute consultation visit was spent on counseling and coordination of care about HLD, HTN and DM, discussion about risk benefit of anticoagulation and answering questions.   Venancio Poisson, AGNP-BC  [facility] [address] [place], [place] [address]  Phone [phone] Fax  [phone]

## 2018-05-05 ENCOUNTER — Encounter: Payer: Self-pay | Admitting: Adult Health

## 2018-05-06 ENCOUNTER — Telehealth: Payer: Self-pay

## 2018-05-06 NOTE — Telephone Encounter (Signed)
[place] disability forms done by Janett Billow NP. PT out of work till 06/15/2018 per Janett Billow NP. Given to Hilda Blades to fax in medical records.

## 2018-05-06 NOTE — Progress Notes (Signed)
[employer] blood pressure monitor supply form filled out, and sign by Janett Billow NP. Fax to [phone]. Fax confirmed, and receive.

## 2018-05-10 LAB — HM DIABETES EYE EXAM

## 2018-06-02 ENCOUNTER — Encounter: Payer: Self-pay | Admitting: Adult Health

## 2018-06-10 ENCOUNTER — Ambulatory Visit: Payer: BLUE CROSS/BLUE SHIELD | Admitting: Adult Health

## 2018-06-17 DIAGNOSIS — Z736 Limitation of activities due to disability: Secondary | ICD-10-CM

## 2018-07-01 DIAGNOSIS — Z0271 Encounter for disability determination: Secondary | ICD-10-CM

## 2018-07-30 ENCOUNTER — Telehealth: Payer: Self-pay | Admitting: Physician Assistant

## 2018-07-30 NOTE — Telephone Encounter (Signed)
Started PA for pt's Lidocaine 5% Ointment.  Needs Julie's signature.  Almyra Free, We need your signature on this one please!   Thank you!   KEY: [id number]

## 2018-07-31 NOTE — Telephone Encounter (Signed)
Signed/sent.

## 2018-08-04 ENCOUNTER — Telehealth: Payer: Self-pay | Admitting: Family Medicine

## 2018-08-04 NOTE — Telephone Encounter (Signed)
Patient was denied their medication Lidocaine 5% ointment by their insurance company. Was previously a [employer] representative patient. Denial letter was placed in the providers box at the nurses station.

## 2018-08-06 NOTE — Telephone Encounter (Signed)
Seeing Jonathan Miles at [facility]

## 2018-09-03 ENCOUNTER — Other Ambulatory Visit: Payer: Self-pay | Admitting: Cardiothoracic Surgery

## 2018-09-03 DIAGNOSIS — I712 Thoracic aortic aneurysm, without rupture, unspecified: Secondary | ICD-10-CM

## 2018-09-08 ENCOUNTER — Telehealth: Payer: Self-pay | Admitting: Nurse Practitioner

## 2018-09-08 NOTE — Telephone Encounter (Signed)
13hr prep called in to [facility] on [address] for CT scan on 10/14/18 @ 0820.  Instructions: 1920- 50mg  Prednisone 0120- 50mg  Prednisone 0720- 50mg  Prednisone and 50mg  Benadryl

## 2018-09-28 DIAGNOSIS — Z0271 Encounter for disability determination: Secondary | ICD-10-CM

## 2018-10-04 ENCOUNTER — Other Ambulatory Visit: Payer: Self-pay | Admitting: Cardiothoracic Surgery

## 2018-10-04 DIAGNOSIS — Z952 Presence of prosthetic heart valve: Secondary | ICD-10-CM

## 2018-10-08 ENCOUNTER — Encounter: Payer: Self-pay | Admitting: Family Medicine

## 2018-10-08 NOTE — Progress Notes (Unsigned)
EYE EXAM: annual diabetic eye exam on July 15/2019 Was completed with dilation did not reveal any diabetic retinopathy

## 2018-10-14 ENCOUNTER — Ambulatory Visit (INDEPENDENT_AMBULATORY_CARE_PROVIDER_SITE_OTHER): Payer: BLUE CROSS/BLUE SHIELD | Admitting: Cardiothoracic Surgery

## 2018-10-14 ENCOUNTER — Other Ambulatory Visit: Payer: BLUE CROSS/BLUE SHIELD

## 2018-10-14 ENCOUNTER — Ambulatory Visit
Admission: RE | Admit: 2018-10-14 | Discharge: 2018-10-14 | Disposition: A | Discharge status: Home / Self Care | Payer: BLUE CROSS/BLUE SHIELD | Source: Ambulatory Visit | Attending: Cardiothoracic Surgery | Admitting: Cardiothoracic Surgery

## 2018-10-14 VITALS — BP 143/85 | HR 88 | Resp 20 | Ht 75.0 in | Wt 318.0 lb

## 2018-10-14 DIAGNOSIS — I77810 Thoracic aortic ectasia: Secondary | ICD-10-CM | POA: Diagnosis not present

## 2018-10-14 DIAGNOSIS — Z952 Presence of prosthetic heart valve: Secondary | ICD-10-CM

## 2018-10-14 MED ORDER — IOPAMIDOL (ISOVUE-370) INJECTION 76%
75.0000 mL | Freq: Once | INTRAVENOUS | Status: AC | PRN
  Administered 2018-10-14: 75 mL via INTRAVENOUS

## 2018-10-14 NOTE — Progress Notes (Signed)
[address]       [place] [address]             [phone]                                        Ysidro Schenk Garber Medical Record #[id number] Date of Birth: [birth year]  Dr  [facility], Provider Not In Primary Cardiology:Christopher End, MD Nelva Bush, MD   Chief Complaint:   PostOp Follow Up Visit 05/07/2012  OPERATIVE REPORT  PREOPERATIVE DIAGNOSIS: Dilated aortic root.  POSTOPERATIVE DIAGNOSIS: Dilated aortic root.  SURGICAL PROCEDURE: Valve-sparing aortic root replacement and coronary  artery bypass grafting x1 with second reverse saphenous vein graft to  the distal right coronary artery with right thigh endo vein harvesting.    History of Present Illness:     Patient notes that since he was last seen he was admitted to [facility] for 3 days in February with diagnosis of stroke.  He notes that the symptoms primarily involved his right leg and arm but resolved fairly quickly.  Though he has not been able to return to work since that time.  He did have several months of rehab.      Social History   Tobacco Use  Smoking Status Former Smoker  . Packs/day: 1.00  . Years: 13.00  . Pack years: 13.00  . Types: Cigarettes  . Last attempt to quit: 01/23/1988  . Years since quitting: 30.7  Smokeless Tobacco Never Used       Allergies  Allergen Reactions  . Ibuprofen Other (See Comments)    TOLD NOT TO TAKE-PER CARDS  . Iodinated Diagnostic Agents Hives    Hives started 4 days after cta chest was performed, minimal relief w/ benadryl x 3 days, uncertain if reaction was actually iv contrast or other allergen,allergy tests to follow.wife will make [place] aware of results//[email or web address]    Current Outpatient Medications  Medication Sig Dispense Refill  . acetaminophen (TYLENOL) 500 MG tablet TAKE 1-2 TABS EVERY 6 HOURS AS NEEDED FOR PAIN    . aspirin 325 MG EC tablet Take 1 tablet (325 mg total) by mouth at bedtime. 90 tablet  3  . atorvastatin (LIPITOR) 80 MG tablet Take 1 tablet (80 mg total) by mouth daily at 6 PM. 90 tablet 3  . Blood Pressure Monitoring (BLOOD PRESSURE MONITOR/L CUFF) MISC Please check blood pressure 2x per day 1 each 0  . buPROPion (WELLBUTRIN XL) 150 MG 24 hr tablet Take by mouth.    . carvedilol (COREG) 12.5 MG tablet TAKE ONE TABLET BY MOUTH TWO TIMES A DAY 180 tablet 2  . felodipine (PLENDIL) 10 MG 24 hr tablet TAKE ONE TABLET BY MOUTH DAILY 90 tablet 2  . furosemide (LASIX) 40 MG tablet TAKE ONE TABLET BY MOUTH DAILY 90 tablet 2  . lidocaine (XYLOCAINE) 5 % ointment Apply 1 application topically as needed. Daily for leg pain 50 g 3  . lisinopril (PRINIVIL,ZESTRIL) 20 MG tablet TAKE ONE TABLET BY MOUTH DAILY 90 tablet 2  . loratadine (CLARITIN) 10 MG tablet Take 10 mg by mouth at bedtime.     . metFORMIN (GLUCOPHAGE) 1000 MG tablet Take 1 tablet (1,000 mg total) by mouth 2 (two) times daily with a meal. 180 tablet 3  . mirabegron ER (MYRBETRIQ) 50 MG TB24 tablet Take 50 mg by  mouth at bedtime.     . sertraline (ZOLOFT) 100 MG tablet Take 1.5 tablets (150 mg total) by mouth daily. 135 tablet 3  . tamsulosin (FLOMAX) 0.4 MG CAPS capsule Take 2 capsules (0.8 mg total) by mouth daily after supper. 180 capsule 3   No current facility-administered medications for this visit.        Physical Exam: BP (!) 143/85   Pulse 88   Resp 20   Ht 6\' 3"  (1.905 m)   Wt (!) 318 lb (144.2 kg)   SpO2 95% Comment: RA  BMI 39.75 kg/m  General appearance: alert and cooperative Head: Normocephalic, without obvious abnormality, atraumatic Neck: no adenopathy, no carotid bruit, no JVD, supple, symmetrical, trachea midline and thyroid not enlarged, symmetric, no tenderness/mass/nodules Resp: clear to auscultation bilaterally Back: symmetric, no curvature. ROM normal. No CVA tenderness. Cardio: regular rate and rhythm, S1, S2 normal, no murmur, click, rub or gallop GI: soft, non-tender; bowel sounds  normal; no masses,  no organomegaly Extremities: extremities normal, atraumatic, no cyanosis or edema and Homans sign is negative, no sign of DVT Neurologic: Grossly normal Patient sternum is stable and healed well he does have significant keloiding of his incision and chest tube sites-we have discussed this in the past but this point he was not interested in any further wound revision  Diagnostic Studies & Laboratory data:          Recent Radiology Findings: Ct Angio Chest Aorta W/cm &/or Wo/cm  Result Date: 10/14/2018 CLINICAL DATA:  Status post proximal aortic repair with previous aortic root replacement EXAM: CT ANGIOGRAPHY CHEST WITH CONTRAST TECHNIQUE: Multidetector CT imaging of the chest was performed using the standard protocol during bolus administration of intravenous contrast. Multiplanar CT image reconstructions and MIPs were obtained to evaluate the vascular anatomy. CONTRAST:  22mL ISOVUE-370 IOPAMIDOL (ISOVUE-370) INJECTION 76% Creatinine 0.7; GFR 121 COMPARISON:  June 17, 2013 and October 15, 2015 FINDINGS: Cardiovascular: Patient has had prior coronary bypass and aortic root repair. The appearance in the aortic root region is stable compared to the previous study with a maximum transverse diameter of 3.7 x 3.7 cm. There is ectasia of coronary artery origins, stable. There are foci of aortic atherosclerosis. The ascending thoracic aorta more distally has a measured transverse diameter of 4.2 x 4.0 cm. There is no thoracic aortic dissection. The visualized great vessels appear normal. There is no evident pulmonary embolus. There is no pericardial effusion or pericardial thickening evident. Mediastinum/Nodes: Thyroid appears unremarkable. There is no appreciable thoracic adenopathy. Note that there is a calcified hilar lymph node, a stable finding. No esophageal lesions are appreciable. Lungs/Pleura: There is a calcified granuloma in the posterior left base region. There is no evident  edema or consolidation. No pleural effusion or pleural thickening evident. Upper Abdomen: There are splenic calcifications consistent with prior granulomatous disease. Visualized upper abdominal structures otherwise appear unremarkable. Musculoskeletal: Patient is status post median sternotomy. There are no evident blastic or lytic bone lesions. No chest wall lesions are evident. Review of the MIP images confirms the above findings. IMPRESSION: 1. Stable appearance of the aortic root region with postoperative change. This area does not appear dilated. Note that there is prominence of the ascending aorta more superiorly with a maximum transverse diameter of 4.2 x 4.0 cm. No evident thoracic aortic dissection. No evident pulmonary embolus. Recommend annual imaging followup by CTA or MRA. This recommendation follows 2010 ACCF/AHA/AATS/ACR/ASA/SCA/SCAI/SIR/STS/SVM Guidelines for the Diagnosis and Management of Patients with Thoracic Aortic Disease. Circulation.  2010; 121: B762-G315. 2. Evidence of prior granulomatous disease. No lung edema or consolidation. 3.  No evident adenopathy. 4.  There are foci of coronary artery calcification. Electronically Signed   By: Lowella Grip III M.D.   On: 10/14/2018 08:49   I have independently reviewed the above radiology studies  and reviewed the findings with the patient.   Ct Angio Chest Aorta W/cm &/or Wo/cm  10/15/2015  CLINICAL DATA:  Status post aortic root replacement and coronary bypass EXAM: CT ANGIOGRAPHY CHEST WITH CONTRAST TECHNIQUE: Multidetector CT imaging of the chest was performed using the standard protocol during bolus administration of intravenous contrast. Multiplanar CT image reconstructions and MIPs were obtained to evaluate the vascular anatomy. CONTRAST:  75 cc Isovue 370 COMPARISON:  06/17/2013 FINDINGS: Mediastinum/Lymph Nodes: Patient is status post median sternotomy. Prior coronary bypass and aortic root repair noted. No aneurysm, dissection,  mediastinal hemorrhage, or hematoma. Stable ectasia of the coronary origins, as before. Visualized central pulmonary arteries appear patent. No significant proximal filling defect or pulmonary embolus demonstrated. Native coronary atherosclerosis noted. Normal heart size. No pericardial or pleural effusion. No adenopathy. Lungs/Pleura: Lungs remain clear. No focal pneumonia, collapse or consolidation. Stable calcified left lower lobe granuloma and left hilar lymph node. No interstitial process or edema. No pneumothorax. Trachea and central airways are patent. Upper abdomen: Splenic punctate calcified granulomas evident. No acute upper abdominal finding. Colonic diverticulosis noted. Musculoskeletal: Minor degenerative endplate changes of the thoracic spine. No compression fracture. Prior median sternotomy. No acute osseous finding. Review of the MIP images confirms the above findings. IMPRESSION: Stable postoperative changes of the thoracic aorta. No acute intra thoracic process or interval change. Remote granulomatous disease. Electronically Signed   By: Jerilynn Mages.  Shick M.D.   On: 10/15/2015 10:03   Ct Angio Chest Aorta W/cm &/or Wo/cm  06/17/2013   *RADIOLOGY REPORT*  Clinical Data: Follow-up status post aortic root replacement  CT ANGIOGRAPHY CHEST  Technique:  Multidetector CT imaging of the chest using the standard protocol during bolus administration of intravenous contrast. Multiplanar reconstructed images including MIPs were obtained and reviewed to evaluate the vascular anatomy.  Contrast: 7mL OMNIPAQUE IOHEXOL 350 MG/ML SOLN  Comparison: Most recent prior CTA of the chest 09/25/2011; most recent prior chest x-ray 10/07/2012  Findings:  Mediastinum: Unremarkable CT appearance of the thyroid gland.  No suspicious mediastinal or hilar adenopathy.  No soft tissue mediastinal mass.  The thoracic esophagus is unremarkable.  Heart/Vascular:  Status post median sternotomy with evidence of valve sparing tube graft  replacement of the ascending aorta and the aortic to RCA saphenous vein graft.  The vein graft opacifies with contrast material.  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  This is unchanged compared to prior. The aortic anastomosis is intact.  No evidence of periaortic fluid, or hematoma.  The heart is within normal limits for size.  No pericardial effusion.  Atherosclerotic vascular calcifications noted in the left anterior descending coronary artery.  Lungs/Pleura: Stable calcified granuloma in the inferior left lower lobe.  The lungs are otherwise clear.  Upper Abdomen: Scattered punctate calcifications throughout the spleen consistent with the sequela of remote granulomatous disease. Otherwise, visualization of the upper abdomen is unremarkable.  Bones: No acute fracture or aggressive appearing lytic or blastic osseous lesion.  Healed median sternotomy.  Sternotomy wires are intact.  IMPRESSION:  1. Compared to the prior chest CTA from November 2012 there are interval surgical changes of repair of  the ascending aorta with a straight tube graft as well as single vessel CABG to the right coronary artery.  No evidence of complication including recurrent aneurysmal dilatation, dissection, leak or mediastinal hematoma. The visualized portion of the aortic to RCA saphenous vein graft opacifies with contrast material.  2.  Sequela of remote granulomatous disease.   Original Report Authenticated By: Jacqulynn Cadet, M.D.    Result status: Final result                              *[facility]*                         [address], [place] [address]                            [phone]  ------------------------------------------------------------------- Transthoracic Echocardiography  Patient:    Torie, Priebe MR #:       [id number] Study Date: 12/17/2017 Gender:     M Age:         86 Height:     182.9 cm Weight:     133.3 kg BSA:        2.66 m^2 Pt. Status: Room:       [place], MD Rozel, Rachal A  Joan Mayans, Rachal A  REFERRING    Derrill Kay A  SONOGRAPHER  [facility]  ATTENDING    Irene Pap N  cc:  ------------------------------------------------------------------- LV EF: 55% -   60%  ------------------------------------------------------------------- Indications:      TIA 435.9.  ------------------------------------------------------------------- History:   PMH:  Aortic root diameter  Congestive heart failure. Risk factors:  Hypertension. Diabetes mellitus. Dyslipidemia.  ------------------------------------------------------------------- Study Conclusions  - Left ventricle: The cavity size was normal. Wall thickness was   increased in a pattern of moderate LVH. Systolic function was   normal. The estimated ejection fraction was in the range of 55%   to 60%. Wall motion was normal; there were no regional wall   motion abnormalities. Doppler parameters are consistent with   abnormal left ventricular relaxation (grade 1 diastolic   dysfunction). - Aortic valve: There was mild regurgitation. Valve area (VTI):   3.48 cm^2. Valve area (Vmax): 3.49 cm^2. Valve area (Vmean): 3.31   cm^2. - Aortic root: The aortic root was mildly dilated. - Left atrium: The atrium was moderately to severely dilated.  ------------------------------------------------------------------- Study data:  Comparison was made to the study of 12/24/2016.  Study status:  Routine.  Procedure:  The patient reported no pain pre or post test. Transthoracic echocardiography. Image quality was adequate.  Study completion:  There were no complications. Transthoracic echocardiography.  M-mode, complete 2D, spectral Doppler, and color Doppler.  Birthdate:  Patient birthdate: May 26, 1961.  Age:  Patient is 57 yr  old.  Sex:  Gender: male. BMI: 39.8 kg/m^2.  Blood pressure:     134/78  Patient status: Inpatient.  Study date:  Study date: 12/17/2017. Study time: 08:50 AM.  Location:  [facility] laboratory.  -------------------------------------------------------------------  -------------------------------------------------------------------  Left ventricle:  The cavity size was normal. Wall thickness was increased in a pattern of moderate LVH. Systolic function was normal. The estimated ejection fraction was in the range of 55% to 60%. Wall motion was normal; there were no regional wall motion abnormalities. Doppler parameters are consistent with abnormal left ventricular relaxation (grade 1 diastolic dysfunction).  ------------------------------------------------------------------- Aortic valve:   Trileaflet; normal thickness leaflets. Mobility was not restricted.  Doppler:  Transvalvular velocity was within the normal range. There was no stenosis. There was mild regurgitation.   VTI ratio of LVOT to aortic valve: 0.71. Valve area (VTI): 3.48 cm^2. Indexed valve area (VTI): 1.31 cm^2/m^2. Peak velocity ratio of LVOT to aortic valve: 0.71. Valve area (Vmax): 3.49 cm^2. Indexed valve area (Vmax): 1.31 cm^2/m^2. Mean velocity ratio of LVOT to aortic valve: 0.67. Valve area (Vmean): 3.31 cm^2. Indexed valve area (Vmean): 1.24 cm^2/m^2.    Mean gradient (S): 6 mm Hg. Peak gradient (S): 12 mm Hg.  ------------------------------------------------------------------- Aorta:  Aortic root: The aortic root was mildly dilated.  ------------------------------------------------------------------- Mitral valve:   Structurally normal valve.   Mobility was not restricted.  Doppler:  Transvalvular velocity was within the normal range. There was no evidence for stenosis. There was no regurgitation.    Peak gradient (D): 2 mm Hg.  ------------------------------------------------------------------- Left atrium:   The atrium was moderately to severely dilated.   ------------------------------------------------------------------- Right ventricle:  The cavity size was normal. Wall thickness was normal. Systolic function was normal.  ------------------------------------------------------------------- Pulmonic valve:    The valve appears to be grossly normal. Doppler:  Transvalvular velocity was within the normal range. There was no evidence for stenosis. There was mild regurgitation.  ------------------------------------------------------------------- Tricuspid valve:   Structurally normal valve.    Doppler: Transvalvular velocity was within the normal range. There was no regurgitation.  ------------------------------------------------------------------- Right atrium:  The atrium was normal in size.  ------------------------------------------------------------------- Pericardium:  There was no pericardial effusion.  ------------------------------------------------------------------- Systemic veins: Inferior vena cava: The vessel was normal in size.  ------------------------------------------------------------------- Measurements   Left ventricle                           Value          Reference  LV ID, ED, PLAX chordal          (H)     52.2  mm       43 - 52  LV ID, ES, PLAX chordal                  37.1  mm       23 - 38  LV fx shortening, PLAX chordal           29    %        >=29  LV PW thickness, ED                      13.8  mm       ----------  IVS/LV PW ratio, ED                      0.97           <=1.3  Stroke volume, 2D                        132   ml       ----------  Stroke volume/bsa, 2D  50    ml/m^2   ----------  LV e&', lateral                           6.25  cm/s     ----------  LV E/e&', lateral                         11.73          ----------  LV e&', medial                            5.46  cm/s     ----------  LV E/e&', medial                           13.42          ----------  LV e&', average                           5.86  cm/s     ----------  LV E/e&', average                         12.52          ----------    Ventricular septum                       Value          Reference  IVS thickness, ED                        13.4  mm       ----------    LVOT                                     Value          Reference  LVOT ID, S                               25    mm       ----------  LVOT area                                4.91  cm^2     ----------  LVOT peak velocity, S                    121   cm/s     ----------  LVOT mean velocity, S                    78.1  cm/s     ----------  LVOT VTI, S                              26.9  cm       ----------  LVOT peak gradient, S                    6     mm Hg    ----------  Aortic valve                             Value          Reference  Aortic valve peak velocity, S            170   cm/s     ----------  Aortic valve mean velocity, S            116   cm/s     ----------  Aortic valve VTI, S                      37.9  cm       ----------  Aortic mean gradient, S                  6     mm Hg    ----------  Aortic peak gradient, S                  12    mm Hg    ----------  VTI ratio, LVOT/AV                       0.71           ----------  Aortic valve area, VTI                   3.48  cm^2     ----------  Aortic valve area/bsa, VTI               1.31  cm^2/m^2 ----------  Velocity ratio, peak, LVOT/AV            0.71           ----------  Aortic valve area, peak velocity         3.49  cm^2     ----------  Aortic valve area/bsa, peak              1.31  cm^2/m^2 ----------  velocity  Velocity ratio, mean, LVOT/AV            0.67           ----------  Aortic valve area, mean velocity         3.31  cm^2     ----------  Aortic valve area/bsa, mean              1.24  cm^2/m^2 ----------  velocity  Aortic regurg pressure half-time         391   ms       ----------    Aorta                                     Value          Reference  Aortic root ID, ED                       40    mm       ----------  Ascending aorta ID, A-P, S               39    mm       ----------    Left atrium  Value          Reference  LA ID, A-P, ES                           53    mm       ----------  LA ID/bsa, A-P                           1.99  cm/m^2   <=2.2  LA volume, S                             95.6  ml       ----------  LA volume/bsa, S                         36    ml/m^2   ----------  LA volume, ES, 1-p A4C                   86.2  ml       ----------  LA volume/bsa, ES, 1-p A4C               32.4  ml/m^2   ----------  LA volume, ES, 1-p A2C                   101   ml       ----------  LA volume/bsa, ES, 1-p A2C               38    ml/m^2   ----------    Mitral valve                             Value          Reference  Mitral E-wave peak velocity              73.3  cm/s     ----------  Mitral A-wave peak velocity              86.7  cm/s     ----------  Mitral deceleration time         (H)     261   ms       150 - 230  Mitral peak gradient, D                  2     mm Hg    ----------  Mitral E/A ratio, peak                   0.8            ----------    Right atrium                             Value          Reference  RA ID, S-I, ES, A4C              (H)     74.3  mm       34 - 49  RA area, ES, A4C                 (H)     24.7  cm^2     8.3 -  19.5  RA volume, ES, A/L                       66.6  ml       ----------  RA volume/bsa, ES, A/L                   25.1  ml/m^2   ----------    Systemic veins                           Value          Reference  Estimated CVP                            3     mm Hg    ----------    Right ventricle                          Value          Reference  TAPSE                                    17.5  mm       ----------  RV s&', lateral, S                        12.8  cm/s     ----------  Legend: (L)  and  (H)  mark  values outside specified reference range.  ------------------------------------------------------------------- Prepared and Electronically Authenticated by  Ezzard Standing, MD [place] 2019-02-21T17:08:52       Recent Labs: Lab Results  Component Value Date   WBC 9.5 03/17/2018   HGB 13.3 03/17/2018   HCT 38.6 03/17/2018   PLT 307 03/17/2018   GLUCOSE 89 03/17/2018   CHOL 142 03/17/2018   TRIG 75 03/17/2018   HDL 44 03/17/2018   LDLCALC 83 03/17/2018   ALT 14 03/17/2018   AST 7 03/17/2018   NA 145 (H) 03/17/2018   K 3.8 03/17/2018   CL 106 03/17/2018   CREATININE 0.84 03/17/2018   BUN 12 03/17/2018   CO2 25 03/17/2018   TSH 1.420 03/17/2018   INR 1.01 12/16/2017   HGBA1C 5.8 (H) 03/17/2018      Assessment / Plan: Patient stable from a cardiac surgery standpoint, most recent echocardiogram February 2019 mild aortic insufficiency unchanged, ejection fraction 60% Recovering from stroke Plan follow-up CTA of the chest in 1 year   Grace Isaac MD 10/14/2018 9:56 AM

## 2018-10-14 NOTE — Patient Instructions (Signed)

## 2018-10-21 ENCOUNTER — Telehealth: Payer: Self-pay | Admitting: General Practice

## 2018-10-21 NOTE — Telephone Encounter (Signed)
Almyra Free,   I did not start a PA on Lidocaine 5% ointment for this pt. It is a [facility] pt that has not been seen here since he saw [facility] in March of this year.  Let me know what you would like for me to do and I can either send it in for a PA or I can call and let him know that he needs to come in to establish care with another provider.  Thank you!   Lavella Lemons

## 2018-10-21 NOTE — Telephone Encounter (Signed)
He will need an appointment

## 2018-10-22 ENCOUNTER — Telehealth: Payer: Self-pay | Admitting: Physician Assistant

## 2018-10-22 NOTE — Telephone Encounter (Signed)
Spoke with pt regarding the PA of the Lidocaine. Pt states that he did followed [facility] to [facility] so they will be handling the PA./ I advised pt that he may want to inform pharmacy that they should take [place] off of his list in order to avoid confusion in the future. Pt acknowledged.

## 2018-11-03 ENCOUNTER — Ambulatory Visit: Payer: Self-pay | Admitting: Adult Health

## 2018-11-03 ENCOUNTER — Encounter: Payer: Self-pay | Admitting: Adult Health

## 2018-11-03 VITALS — BP 134/88 | HR 71 | Ht 75.0 in | Wt 322.0 lb

## 2018-11-03 DIAGNOSIS — R51 Headache: Secondary | ICD-10-CM

## 2018-11-03 DIAGNOSIS — Z8673 Personal history of transient ischemic attack (TIA), and cerebral infarction without residual deficits: Secondary | ICD-10-CM

## 2018-11-03 DIAGNOSIS — E119 Type 2 diabetes mellitus without complications: Secondary | ICD-10-CM

## 2018-11-03 DIAGNOSIS — F3289 Other specified depressive episodes: Secondary | ICD-10-CM

## 2018-11-03 DIAGNOSIS — I10 Essential (primary) hypertension: Secondary | ICD-10-CM

## 2018-11-03 DIAGNOSIS — E785 Hyperlipidemia, unspecified: Secondary | ICD-10-CM

## 2018-11-03 DIAGNOSIS — R519 Headache, unspecified: Secondary | ICD-10-CM

## 2018-11-03 DIAGNOSIS — G4733 Obstructive sleep apnea (adult) (pediatric): Secondary | ICD-10-CM

## 2018-11-03 DIAGNOSIS — Z9989 Dependence on other enabling machines and devices: Secondary | ICD-10-CM

## 2018-11-03 MED ORDER — AMITRIPTYLINE HCL 25 MG PO TABS: 25.0000 mg | ORAL_TABLET | Freq: Every day | ORAL | 2 refills | Status: DC

## 2018-11-03 NOTE — Patient Instructions (Addendum)
Continue aspirin 325 mg daily  and lipitor  for secondary stroke prevention  Continue to follow up with PCP regarding cholesterol, blood pressure and diabetes management   We will start Elavil 25mg  nightly due to migraines/tension headaches, fatigue and continued depression symptoms - information provided for your review and information   Continue to monitor blood pressure at home  Maintain strict control of hypertension with blood pressure goal below 130/90, diabetes with hemoglobin A1c goal below 6.5% and cholesterol with LDL cholesterol (bad cholesterol) goal below 70 mg/dL. I also advised the patient to eat a healthy diet with plenty of whole grains, cereals, fruits and vegetables, exercise regularly and maintain ideal body weight.  Followup in the future with me in 3 months or call earlier if needed       Thank you for coming to see [place] at [facility]. I hope we have been able to provide you high quality care today.  You may receive a patient satisfaction survey over the next few weeks. We would appreciate your feedback and comments so that we may continue to improve ourselves and the health of our patients.

## 2018-11-03 NOTE — Progress Notes (Signed)
[facility] [address] [place]. [place] [address] [phone]       OFFICE FOLLOW UP NOTE  Mr. Sotero Brinkmeyer Date of Birth:  [birth year] Medical Record Number:  [id number]   Reason for Referral:  hospital stroke follow up  CHIEF COMPLAINT:  Chief Complaint  Patient presents with  . 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 History obtained from patient and chart review. Reviewed all radiology images and labs personally.  Mr. Creger is a 58 year old male with PMH of DM, HTN, HLD, OSA on CPAP and CAD who presented with acute onset of dizziness, slurred speech and ataxia on 12/16/2017.  CT head reviewed and showed no intracranial abnormality.  MRI brain reviewed and did show an acute infarct in the left pons.  Bilateral carotid ultrasound performed and showed bilateral ICA stenosis of 1-39%.  2D echo performed and showed an EF of 55-60% with moderate LVH. LDL 82 and was recommended to increase Lipitor from 40 mg to 80 mg.  A1c 5.5.  Patient was previously on aspirin 81 mg and was recommended to increase his to aspirin 325 mg daily.  Patient was discharged home in stable condition. Since discharge, patient has been participating in PT/OT.  He is accompanied at today's appointment with his wife.  He completed OT but continues PT 2 times per week.  He is making great improvement but does continue to exhibit some right hemiplegia.  He does complain about frequent fatigue especially after PT sessions or with increased activity.  He does continue to take aspirin without increase in bruising or bleeding.  Continues to take Lipitor with mild aches but able to tolerate them and these are improving.  Today's blood pressure satisfactory at 135/87.  Patient does have OSA and is compliant with CPAP machine.  He does have  complaints of a headache that occurs on a weekly basis that has been occurring for the past 2 years.  He relates these to stress.  They start in the back of his neck and go up into his head and describes it as pressure.  Does have mild photophobia and phonophobia but denies nausea and vomiting.  Does take Tylenol which does seem to relieve these headaches.  He does continue to take Zoloft which has been helping with depression and anxiety.  As patient is employed doing housing maintenance, which consists of heavy lifting, frequent walking, frequent climbing, and a lot of driving, it would not be safe for him to return to work at this time due to continued right sided hemiplegia and balance difficulties, especially as he is unable to return to work if he has restrictions or unable to go back full-time.  It would be in the best interest of the patient if he continues PT 2 times per week and continues home exercises in order to work on balance and increase strength on right side. No new or worsening stroke/TIA symptoms.  02/24/2018 visit: Patient returns today for 55-month follow-up.  He is being reevaluated for possible return to work.  He recently completed physical therapy as he has made great improvement in his strength.  He does still have complaints of increased fatigue after moderate activity where his symptoms such as dizziness or weakness can become more pronounced.  He states he was able  to mow the yard which took him approximately 20 minutes and patient had to lay down afterwards due to extreme fatigue.  Blood pressure at today's visit 162/94 but states he has not taken his medications yet and typically 130 over 80s at home.  Continues to be compliant with CPAP for OSA.  Continues to take aspirin without side effects of bleeding or bruising.  Continues to take atorvastatin without side effects of myalgias.  Concern for patient to return to work full-time as he does have a high activity demanding job such as  lifting heavy objects or climbing on high ladders.  Concern for if  increase in fatigue does occur, he can become weaker or have episodes of dizziness.  Recommend patient states to return to work on a part-time basis only at this time.  MRA head and neck performed on 02/03/2018 which did show a normal MRA of neck but MRI of head did show possible arthrosclerosis and left vertebral artery but per report, this could also represent artifact.  All remainder medium to large sized intracranial vessels appeared unremarkable.  03/10/18 visit: Patient returns today for 2-week follow-up for possible return to work and is accompanied by his wife.  It was recommended that patient return to work part-time at previous visit but with limitations such as not climbing ladders or high elevations or lifting heavy objects.  He continues to feel increased fatigue after activities that prior to the stroke would not make him feel as fatigued.  For example, patient mowed grass this morning with a push mower and feels as though he needed to lay down.  He will start to have difficulty walking, balance issues and a headache with this increased fatigue.  He does only receive approximately 5 to 6 hours of sleep a night due to being restless and "mind going constantly".  Patient does have history of OSA and is compliant with CPAP machine.  Patient does state that he has had increased stress and depression recently due to job stress and being unable to go back to work.  Currently on Zoloft 100 mg.  Patient questioning resources regarding vocational rehab or possibly taking early retirement and would like more information or resources on these topics.  Patient continues to not be safe at this time to return to work full-time or even part-time without restrictions as patient's job consists of climbing on ladders or working at [employer] or lifting heavy objects.  As patient fatigues quickly over smaller jobs/activities, patient would not be safe  on ladders or higher elevations due to increase of difficulty walking and balance issues with increased activity and fatigue.  Denies new or worsening stroke/TIA symptoms.  05/03/18 UPDATE: Patient is being seen today for follow-up visit for patient request for short-term disability extension due to continued fatigue.  Patient states that he he has no energy to complete certain tasks especially household work or yard work and this is not like him as he was previously very active person.  He has had increased depression with anxiety and his PCP recently started him on Wellbutrin.  His BP has been increased and at today's appointment 171/92 (the patient did not take antihypertensives today) but was also elevated at last week's appointment with PCP at 160/84.  Antihypertensives were not adjusted at PCP visit due to thought of possible increased BP due to anxiety/depression.  Patient does not check BP at home as they do not have a working BP cuff.  Also complains of new onset headaches  for approximately the past 3 weeks that occur 2 times per week and are localized in the frontal region and back of head/neck area with stiffness in his neck and are accompanied by phonophobia, at times blurring of vision, and are debilitating.  Patient has taken Tylenol 500 mg which "takes the edge off" but also makes him fatigued and was told in the past to limit amount of Tylenol.  Patient does have a history of headaches and has been on Topamax in the past but was unable to tolerate Topamax.  Patient does have a history of OSA and is compliant with CPAP but has not had follow-up appointment for OSA since diagnosed in 2012.  At this time, patient feels as though he is unable to return to work due to continued excessive fatigue, headaches, and uncontrolled blood pressure denies new or worsening stroke/TIA symptoms.  Interval history 11/03/2018: Patient is being seen today for 81-month follow-up.  He does still continue to have daytime  fatigue and denies any improvement over time but denies worsening.  He does continue to use CPAP for OSA management.  He states that he will have some good days where he is able to do more activities and then some bad days but typically depending on how he is sleeping at night.  He does endorse difficulty sleeping at night over the past few months which has been making his daytime fatigue worse.  He continues to experience occasional headaches that occur approximately 1 time per week and may last for a few hours but typically subside after taking Tylenol.  Continues to be located frontal and occipital portion of his head and at times can experience blurry vision and photophobia.  Denies nausea/vomiting.  He feels as though his depression has "leveled off" where he feels a slight improvement but does not feel as though he is returned to his "normal self before my stroke".  He does continue to take Zoloft and Wellbutrin which is prescribed by his PCP.  He also continues to experience cognitive deficits since his stroke.  Continues to take aspirin without side effects of bleeding or bruising.  Continues to take atorvastatin without side effects myalgias.  Blood pressure today 134/88.  He is currently in the process of applying for long-term disability.  No further concerns at this time.  Denies new or worsening stroke/TIA symptoms.    ROS:   14 system review of systems performed and negative with exception of fatigue, shortness of breath, restless leg, apnea, daytime sleepiness, joint pain, joint swelling, back pain, walking difficulty, dizziness, headache, weakness, agitation and depression  PMH:  Past Medical History:  Diagnosis Date  . Alcohol abuse   . Aortic root dilatation (HCC)    a. echo 4/12: EF 55-60%, mod to marked dilated ascending aortic root;   b. MRA 4/12: Aortic root 5.5 cm, dilation of left and right coronary cusps, trileaflet aortic valve // Echo 2/19: Moderate LVH, EF 55-60, normal wall  motion, grade 1 diastolic dysfunction, mild AI, mildly dilated aortic root, moderate to severe LAE (aortic root 40 mm; ascending aorta 39 mm)   . Arthritis   . Asthma    ast attack in early 20's  . CAD (coronary artery disease)   . Chest pain    Myoview 4/12: EF 62%, no ischemia or scar  . CHF (congestive heart failure) ([place])   . Chronic pain    back and hands  . Constipation   . COPD (chronic obstructive pulmonary disease) ([place])   .  Depression   . Diabetes mellitus    type 2  . Difficulty speaking   . Drug use   . Enlarged prostate   . Fatigue   . GERD (gastroesophageal reflux disease)    modifies with diet  . Hx of echocardiogram    Echo (2/16):  Mod LVH, EF 55-60%, mild to mod AI, mod LAE, mild RAE, Aortic root 38 mm, Asc aorta 43 mm  . Hyperlipidemia   . Hypertension   . Joint pain   . Knee pain, bilateral   . Leg pain    ABIs 3/19: Normal (R 1.3; L 1.28)  . OSA (obstructive sleep apnea) 2006   .  Wears at times  . Osteoarthritis   . Palpitations   . Stroke [facility])     PSH:  Past Surgical History:  Procedure Laterality Date  . APPENDECTOMY  12/2016  . Colonscopy    . CORONARY ARTERY BYPASS GRAFT  05/07/2012   Procedure: CORONARY ARTERY BYPASS GRAFTING (CABG);  Surgeon: Grace Isaac, MD;  Location: [facility];  Service: Open Heart Surgery;  Laterality: N/A;  . KNEE ARTHROSCOPY Left 2010   Dr. Rosamaria Lints  . LAPAROSCOPIC APPENDECTOMY N/A 01/06/2017   Procedure: APPENDECTOMY LAPAROSCOPIC;  Surgeon: Ralene Ok, MD;  Location: [facility];  Service: General;  Laterality: N/A;  . Retactment of fingers     as a child , sewed with sewing machine- Index and middle finger  . UPPER GASTROINTESTINAL ENDOSCOPY      Social History:  Social History   Socioeconomic History  . Marital status: Married    Spouse name: Zurich Carreno  . Number of children: 0  . Years of education: 12th grade  . Highest education level: Not on file  Occupational History  . 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  Social Needs  . Financial resource strain: Not on file  . Food insecurity:    Worry: Not on file    Inability: Not on file  . Transportation needs:    Medical: Not on file    Non-medical: Not on file  Tobacco Use  . Smoking status: Former Smoker    Packs/day: 1.00    Years: 13.00    Pack years: 13.00    Types: Cigarettes    Last attempt to quit: 01/23/1988    Years since quitting: 30.8  . Smokeless tobacco: Never Used  Substance and Sexual Activity  . Alcohol use: Yes    Comment: quit 1989  . Drug use: No    Comment: previous cocaine and marijuana use, quit 1988  . Sexual activity: Not Currently    Partners: Female  Lifestyle  . Physical activity:    Days per week: Not on file    Minutes per session: Not on file  . Stress: Not on file  Relationships  . Social connections:    Talks on phone: Not on file    Gets together: Not on file    Attends religious service: Not on file    Active member of club or organization: Not on file    Attends meetings of clubs or organizations: Not on file    Relationship status: Not on file  . Intimate partner violence:    Fear of current or ex partner: Not on file    Emotionally abused: Not on file    Physically abused: Not on file    Forced sexual activity: Not on file  Other Topics Concern  .  Not on file  Social History Narrative   Married, lives in [place]. Therapist, occupational for [employer].    Pt is adopted. Unsure of his family medical history.    Family History:  Family History  Adopted: Yes  Family history unknown: Yes    Medications:   Current Outpatient Medications on File Prior to Visit  Medication Sig Dispense Refill  . acetaminophen (TYLENOL) 500 MG tablet TAKE 1-2 TABS EVERY 6 HOURS AS NEEDED FOR PAIN    . aspirin 325 MG EC tablet Take 1 tablet (325 mg total) by mouth at bedtime. 90 tablet 3  . atorvastatin (LIPITOR) 80 MG  tablet Take 1 tablet (80 mg total) by mouth daily at 6 PM. 90 tablet 3  . Blood Pressure Monitoring (BLOOD PRESSURE MONITOR/L CUFF) MISC Please check blood pressure 2x per day 1 each 0  . buPROPion (WELLBUTRIN XL) 150 MG 24 hr tablet Take by mouth.    . carvedilol (COREG) 12.5 MG tablet TAKE ONE TABLET BY MOUTH TWO TIMES A DAY 180 tablet 2  . felodipine (PLENDIL) 10 MG 24 hr tablet TAKE ONE TABLET BY MOUTH DAILY 90 tablet 2  . furosemide (LASIX) 40 MG tablet TAKE ONE TABLET BY MOUTH DAILY 90 tablet 2  . lidocaine (XYLOCAINE) 5 % ointment Apply 1 application topically as needed. Daily for leg pain 50 g 3  . lisinopril (PRINIVIL,ZESTRIL) 20 MG tablet TAKE ONE TABLET BY MOUTH DAILY 90 tablet 2  . loratadine (CLARITIN) 10 MG tablet Take 10 mg by mouth at bedtime.     . metFORMIN (GLUCOPHAGE) 1000 MG tablet Take 1 tablet (1,000 mg total) by mouth 2 (two) times daily with a meal. 180 tablet 3  . mirabegron ER (MYRBETRIQ) 50 MG TB24 tablet Take 50 mg by mouth at bedtime.     . sertraline (ZOLOFT) 100 MG tablet Take 1.5 tablets (150 mg total) by mouth daily. 135 tablet 3  . tamsulosin (FLOMAX) 0.4 MG CAPS capsule Take 2 capsules (0.8 mg total) by mouth daily after supper. 180 capsule 3   No current facility-administered medications on file prior to visit.     Allergies:   Allergies  Allergen Reactions  . Ibuprofen Other (See Comments)    TOLD NOT TO TAKE-PER CARDS  . Iodinated Diagnostic Agents Hives    Hives started 4 days after cta chest was performed, minimal relief w/ benadryl x 3 days, uncertain if reaction was actually iv contrast or other allergen,allergy tests to follow.wife will make [place] aware of results//a.calhoun  . Quinolones      Physical Exam  Vitals:   11/03/18 1006  BP: 134/88  Pulse: 71  Weight: (!) 322 lb (146.1 kg)  Height: 6\' 3"  (1.905 m)   Body mass index is 40.25 kg/m. No exam data present  General: Obese pleasant middle-age male, seated, in no evident distress  but flat affect Head: head normocephalic and atraumatic.   Neck: supple with no carotid or supraclavicular bruits Cardiovascular: regular rate and rhythm, no murmurs Musculoskeletal: no deformity Skin:  no rash/petichiae Vascular:  Normal pulses all extremities  Neurologic Exam Mental Status: Awake and fully alert.  No dysarthria present.  Oriented to place and time. Recent and remote memory intact. Attention span, concentration and fund of knowledge appropriate. Mood and affect appropriate.  Cranial Nerves: Fundoscopic exam reveals sharp disc margins. Pupils equal, briskly reactive to light. Extraocular movements full without nystagmus. Visual fields full to confrontation. Hearing intact. Facial sensation intact.   Motor:  5/5 strength in all extremities tested Sensory.: intact to touch , pinprick , position and vibratory sensation.  Coordination: Rapid alternating movements normal in all extremities. Finger-to-nose and heel-to-shin performed accurately bilaterally.   Gait and Station: Arises from chair without difficulty. Stance is normal. Gait demonstrates normal stride but does have stiffness in right leg while ambulating.  Able to heel, toe and tandem walk with mild difficulty.  Reflexes: 1+ and symmetric. Toes downgoing.    Diagnostic Data (Labs, Imaging, Testing)  MRA HEAD WO CONTRAST 02/03/18 IMPRESSION:  MRA head (without) demonstrating: - Hypoplastic left vertebral artery with focal area of decreased flow signal in the distal segment.  While this can be seen with focal atherosclerosis, this more likely represents area of technical artifact, especially when correlated with normal-appearing flow signal on MRA neck from same day. - Remainder of medium to large sized intracranial vessels appear unremarkable.  MRA NECK WO CONTRAST 02/03/18 IMPRESSION:  Normal MRA neck (with and without).   Ct Head Wo Contrast Result Date: 12/16/2017  IMPRESSION: 1. No evidence of acute  intracranial abnormality. 2. Mild chronic small vessel ischemic disease.   [name] Result Date: 12/16/2017 IMPRESSION: 1 cm acute infarct left pons Mild to moderate chronic microvascular ischemic changes in the cerebral white matter bilaterally. Electronically Signed      Echocardiogram:      12/17/2017                                     Study Conclusions - Left ventricle: The cavity size was normal. Wall thickness was increased in a pattern of moderate LVH. Systolic function was normal. The estimated ejection fraction was in the range of 55% to 60%. Wall motion was normal; there were no regional wall motion abnormalities. Doppler parameters are consistent with abnormal left ventricular relaxation (grade 1 diastolic dysfunction). - Aortic valve: There was mild regurgitation. Valve area (VTI): 3.48 cm^2. Valve area (Vmax): 3.49 cm^2. Valve area (Vmean): 3.31 cm^2. - Aortic root: The aortic root was mildly dilated. - Left atrium: The atrium was moderately to severely dilated.  B/L Carotid U/S:   12/17/2017                                         Final Interpretation: Right Carotid: Velocities in the right ICA are consistent with a 1-39% stenosis. Left Carotid: Velocities in the left ICA are consistent with a 1-39% stenosis. Vertebrals: Both vertebral arteries were patent with antegrade flow. Subclavians: Normal flow hemodynamics were seen in bilateral subclavian arteries.    ASSESSMENT: Lamonte Hartt is a 58 y.o. 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 Vascular risk factors include HTN, HLD, DM, and CAD.  Patient is being seen today for follow-up visit with continued daytime fatigue, headaches and depression but overall has been stable from a stroke standpoint without any new or worsening stroke/TIA symptoms.    PLAN: -Continue aspirin 325 mg daily  and lipitor  for secondary stroke  prevention -Amitriptyline 25 mg nightly to assist with headaches but also advised patient that this may benefit him regarding his chronic fatigue and depression.  He has trialed Topamax in the past and unable to tolerate. -F/u with PCP regarding your HTN, HLD, DM and depression management  -Advised patient  that referral can be placed to neuropsychology for further evaluation of post stroke depression and cognitive deficits -patient will call office if he is interested -Recommended increasing activity during the day along with maintaining a healthy diet -Maintain strict control of hypertension with blood pressure goal below 130/90, diabetes with hemoglobin A1c goal below 6.5% and cholesterol with LDL cholesterol (bad cholesterol) goal below 70 mg/dL. I also advised the patient to eat a healthy diet with plenty of whole grains, cereals, fruits and vegetables, exercise regularly and maintain ideal body weight.  Follow up in 3 months or call earlier if needed  Greater than 50% time during this 25 minute consultation visit was spent on counseling and coordination of care about HLD, HTN and DM, discussion about risk benefit of anticoagulation and answering questions.   Venancio Poisson, AGNP-BC  [facility] [address] [place], [place] [address]  Phone [phone] Fax [phone]

## 2018-11-12 NOTE — Progress Notes (Signed)
I agree with the above plan 

## 2018-11-19 ENCOUNTER — Emergency Department (HOSPITAL_COMMUNITY): Payer: BLUE CROSS/BLUE SHIELD

## 2018-11-19 ENCOUNTER — Emergency Department (HOSPITAL_COMMUNITY)
Admission: EM | Admit: 2018-11-19 | Discharge: 2018-11-19 | Disposition: A | Discharge status: Home / Self Care | Payer: BLUE CROSS/BLUE SHIELD | Attending: Emergency Medicine | Admitting: Emergency Medicine

## 2018-11-19 ENCOUNTER — Encounter (HOSPITAL_COMMUNITY): Payer: Self-pay | Admitting: Emergency Medicine

## 2018-11-19 DIAGNOSIS — K8020 Calculus of gallbladder without cholecystitis without obstruction: Secondary | ICD-10-CM | POA: Diagnosis not present

## 2018-11-19 DIAGNOSIS — I110 Hypertensive heart disease with heart failure: Secondary | ICD-10-CM | POA: Diagnosis not present

## 2018-11-19 DIAGNOSIS — K8050 Calculus of bile duct without cholangitis or cholecystitis without obstruction: Secondary | ICD-10-CM

## 2018-11-19 DIAGNOSIS — E119 Type 2 diabetes mellitus without complications: Secondary | ICD-10-CM | POA: Insufficient documentation

## 2018-11-19 DIAGNOSIS — I5032 Chronic diastolic (congestive) heart failure: Secondary | ICD-10-CM | POA: Diagnosis not present

## 2018-11-19 DIAGNOSIS — Z79899 Other long term (current) drug therapy: Secondary | ICD-10-CM | POA: Insufficient documentation

## 2018-11-19 DIAGNOSIS — I2510 Atherosclerotic heart disease of native coronary artery without angina pectoris: Secondary | ICD-10-CM | POA: Insufficient documentation

## 2018-11-19 DIAGNOSIS — J449 Chronic obstructive pulmonary disease, unspecified: Secondary | ICD-10-CM | POA: Diagnosis not present

## 2018-11-19 DIAGNOSIS — Z8673 Personal history of transient ischemic attack (TIA), and cerebral infarction without residual deficits: Secondary | ICD-10-CM | POA: Diagnosis not present

## 2018-11-19 DIAGNOSIS — J45909 Unspecified asthma, uncomplicated: Secondary | ICD-10-CM | POA: Insufficient documentation

## 2018-11-19 DIAGNOSIS — Z7984 Long term (current) use of oral hypoglycemic drugs: Secondary | ICD-10-CM | POA: Diagnosis not present

## 2018-11-19 DIAGNOSIS — Z87891 Personal history of nicotine dependence: Secondary | ICD-10-CM | POA: Insufficient documentation

## 2018-11-19 DIAGNOSIS — E785 Hyperlipidemia, unspecified: Secondary | ICD-10-CM | POA: Insufficient documentation

## 2018-11-19 DIAGNOSIS — Z7982 Long term (current) use of aspirin: Secondary | ICD-10-CM | POA: Diagnosis not present

## 2018-11-19 DIAGNOSIS — R1011 Right upper quadrant pain: Secondary | ICD-10-CM | POA: Diagnosis present

## 2018-11-19 LAB — CBC WITH DIFFERENTIAL/PLATELET
Abs Immature Granulocytes: 0.06 10*3/uL (ref 0.00–0.07)
Basophils Absolute: 0.1 10*3/uL (ref 0.0–0.1)
Basophils Relative: 1 %
Eosinophils Absolute: 0.2 10*3/uL (ref 0.0–0.5)
Eosinophils Relative: 1 %
HCT: 40.4 % (ref 39.0–52.0)
Hemoglobin: 13.4 g/dL (ref 13.0–17.0)
Immature Granulocytes: 1 %
Lymphocytes Relative: 10 %
Lymphs Abs: 1.2 10*3/uL (ref 0.7–4.0)
MCH: 28 pg (ref 26.0–34.0)
MCHC: 33.2 g/dL (ref 30.0–36.0)
MCV: 84.5 fL (ref 80.0–100.0)
Monocytes Absolute: 0.7 10*3/uL (ref 0.1–1.0)
Monocytes Relative: 6 %
Neutro Abs: 9.6 10*3/uL — ABNORMAL HIGH (ref 1.7–7.7)
Neutrophils Relative %: 81 %
Platelets: 301 10*3/uL (ref 150–400)
RBC: 4.78 MIL/uL (ref 4.22–5.81)
RDW: 14.3 % (ref 11.5–15.5)
WBC: 11.8 10*3/uL — ABNORMAL HIGH (ref 4.0–10.5)
nRBC: 0 % (ref 0.0–0.2)

## 2018-11-19 LAB — COMPREHENSIVE METABOLIC PANEL
ALT: 22 U/L (ref 0–44)
AST: 12 U/L — ABNORMAL LOW (ref 15–41)
Albumin: 4 g/dL (ref 3.5–5.0)
Alkaline Phosphatase: 67 U/L (ref 38–126)
Anion gap: 12 (ref 5–15)
BUN: 16 mg/dL (ref 6–20)
CO2: 25 mmol/L (ref 22–32)
Calcium: 9.2 mg/dL (ref 8.9–10.3)
Chloride: 103 mmol/L (ref 98–111)
Creatinine, Ser: 0.88 mg/dL (ref 0.61–1.24)
GFR calc Af Amer: >60 mL/min (ref >60)
GFR calc non Af Amer: >60 mL/min (ref >60)
Glucose, Bld: 137 mg/dL — ABNORMAL HIGH (ref 70–99)
Potassium: 3.6 mmol/L (ref 3.5–5.1)
Sodium: 140 mmol/L (ref 135–145)
Total Bilirubin: 0.7 mg/dL (ref 0.3–1.2)
Total Protein: 6.7 g/dL (ref 6.5–8.1)

## 2018-11-19 LAB — LIPASE, BLOOD: Lipase: 34 U/L (ref 11–51)

## 2018-11-19 MED ORDER — HYDROMORPHONE HCL 1 MG/ML IJ SOLN
1.0000 mg | Freq: Once | INTRAMUSCULAR | Status: AC
  Administered 2018-11-19: 1 mg via INTRAVENOUS
  Filled 2018-11-19: qty 1

## 2018-11-19 MED ORDER — OXYCODONE-ACETAMINOPHEN 5-325 MG PO TABS: 2.0000 | ORAL_TABLET | ORAL | 0 refills | Status: DC | PRN

## 2018-11-19 MED ORDER — LORAZEPAM 2 MG/ML IJ SOLN
1.0000 mg | Freq: Once | INTRAMUSCULAR | Status: AC
  Administered 2018-11-19: 1 mg via INTRAVENOUS
  Filled 2018-11-19: qty 1

## 2018-11-19 MED ORDER — SODIUM CHLORIDE 0.9 % IV SOLN
INTRAVENOUS | Status: DC
  Administered 2018-11-19: 22:00:00 via INTRAVENOUS

## 2018-11-19 NOTE — ED Triage Notes (Signed)
Brought by ems from home for c/o Right upper quad abdominal pain that radiates into the left side and n/v.  Reports same kind of pain with gallstones in the past.  Given fentanyl 153mcg en route.  CBG-121.  Reports nausea is better at this time.  Hx of htn.  BP elevated.

## 2018-11-19 NOTE — ED Provider Notes (Signed)
[facility] EMERGENCY DEPARTMENT Provider Note   CSN: [id number] Arrival date & time: 11/19/18  2120     History   Chief Complaint Chief Complaint  Patient presents with  . Abdominal Pain    HPI Jonathan Miles is a 58 y.o. male.  This is a 58 year old male with a history of cholelithiasis who presents with acute onset of right upper quadrant pain that began after he ate steak and shrimp.  Ms. Legrand Como patient has had nonbilious emesis without fever or chills.  Pain does radiate to his back.  No recent symptoms for about a year.  Called EMS was given IV fluids and fentanyl and transported here.     Past Medical History:  Diagnosis Date  . Alcohol abuse   . Aortic root dilatation (HCC)    a. echo 4/12: EF 55-60%, mod to marked dilated ascending aortic root;   b. MRA 4/12: Aortic root 5.5 cm, dilation of left and right coronary cusps, trileaflet aortic valve // Echo 2/19: Moderate LVH, EF 55-60, normal wall motion, grade 1 diastolic dysfunction, mild AI, mildly dilated aortic root, moderate to severe LAE (aortic root 40 mm; ascending aorta 39 mm)   . Arthritis   . Asthma    ast attack in early 20's  . CAD (coronary artery disease)   . Chest pain    Myoview 4/12: EF 62%, no ischemia or scar  . CHF (congestive heart failure) ([place])   . Chronic pain    back and hands  . Constipation   . COPD (chronic obstructive pulmonary disease) ([place])   . Depression   . Diabetes mellitus    type 2  . Difficulty speaking   . Drug use   . Enlarged prostate   . Fatigue   . GERD (gastroesophageal reflux disease)    modifies with diet  . Hx of echocardiogram    Echo (2/16):  Mod LVH, EF 55-60%, mild to mod AI, mod LAE, mild RAE, Aortic root 38 mm, Asc aorta 43 mm  . Hyperlipidemia   . Hypertension   . Joint pain   . Knee pain, bilateral   . Leg pain    ABIs 3/19: Normal (R 1.3; L 1.28)  . OSA (obstructive sleep apnea) 2006   .  Wears at times  . Osteoarthritis   .  Palpitations   . Stroke [facility])     Patient Active Problem List   Diagnosis Date Noted  . Late effect of stroke 12/26/2017  . History of stroke 12/16/2017  . BMI 36.0-36.9,adult 08/05/2017  . Cholelithiasis 08/05/2017  . Bilateral plantar fasciitis 08/05/2017  . Situational mixed anxiety and depressive disorder 08/05/2017  . Bilateral primary osteoarthritis of knee 07/01/2017  . Asthma 01/26/2017  . Overactive bladder 01/21/2017  . BPH with obstruction/lower urinary tract symptoms 01/21/2017  . S/P appendectomy 01/07/2017  . Diverticulosis 11/24/2016  . Type 2 diabetes mellitus without complication, without long-term current use of insulin ([place]) 10/31/2016  . Aortic insufficiency 11/23/2012  . Chronic diastolic CHF (congestive heart failure) ([place]) 11/23/2012  . CAD (coronary artery disease) 11/27/2011  . OSA (obstructive sleep apnea) 11/11/2011  . Aortic root dilatation ([place])   . Hyperlipidemia 01/23/2011  . Hypertension 01/23/2011    Past Surgical History:  Procedure Laterality Date  . APPENDECTOMY  12/2016  . Colonscopy    . CORONARY ARTERY BYPASS GRAFT  05/07/2012   Procedure: CORONARY ARTERY BYPASS GRAFTING (CABG);  Surgeon: Grace Isaac, MD;  Location: [place];  Service: Open Heart Surgery;  Laterality: N/A;  . KNEE ARTHROSCOPY Left 2010   Dr. Rosamaria Lints  . LAPAROSCOPIC APPENDECTOMY N/A 01/06/2017   Procedure: APPENDECTOMY LAPAROSCOPIC;  Surgeon: Ralene Ok, MD;  Location: [place];  Service: General;  Laterality: N/A;  . Retactment of fingers     as a child , sewed with sewing machine- Index and middle finger  . UPPER GASTROINTESTINAL ENDOSCOPY          Home Medications    Prior to Admission medications   Medication Sig Start Date End Date Taking? Authorizing Provider  acetaminophen (TYLENOL) 500 MG tablet TAKE 1-2 TABS EVERY 6 HOURS AS NEEDED FOR PAIN 05/26/12   Richardson Dopp T, PA-C  amitriptyline (ELAVIL) 25 MG tablet Take 1 tablet (25 mg total) by mouth at  bedtime. 11/03/18   Venancio Poisson, NP  aspirin 325 MG EC tablet Take 1 tablet (325 mg total) by mouth at bedtime. 02/20/18   Harrison Mons, PA  atorvastatin (LIPITOR) 80 MG tablet Take 1 tablet (80 mg total) by mouth daily at 6 PM. 01/21/18   Harrison Mons, PA  Blood Pressure Monitoring (BLOOD PRESSURE MONITOR/L CUFF) MISC Please check blood pressure 2x per day 05/03/18   Venancio Poisson, NP  buPROPion (WELLBUTRIN XL) 150 MG 24 hr tablet Take by mouth. 04/30/18   [provider]  carvedilol (COREG) 12.5 MG tablet TAKE ONE TABLET BY MOUTH TWO TIMES A DAY 01/04/18   Harrison Mons, PA  felodipine (PLENDIL) 10 MG 24 hr tablet TAKE ONE TABLET BY MOUTH DAILY 11/03/17   Harrison Mons, PA  furosemide (LASIX) 40 MG tablet TAKE ONE TABLET BY MOUTH DAILY 11/03/17   Harrison Mons, PA  lidocaine (XYLOCAINE) 5 % ointment Apply 1 application topically as needed. Daily for leg pain 01/23/18   Harrison Mons, PA  lisinopril (PRINIVIL,ZESTRIL) 20 MG tablet TAKE ONE TABLET BY MOUTH DAILY 11/03/17   Harrison Mons, PA  loratadine (CLARITIN) 10 MG tablet Take 10 mg by mouth at bedtime.     [provider]  metFORMIN (GLUCOPHAGE) 1000 MG tablet Take 1 tablet (1,000 mg total) by mouth 2 (two) times daily with a meal. 01/21/18   Jeffery, Domingo Mend, PA  mirabegron ER (MYRBETRIQ) 50 MG TB24 tablet Take 50 mg by mouth at bedtime.     [provider]  sertraline (ZOLOFT) 100 MG tablet Take 1.5 tablets (150 mg total) by mouth daily. 03/10/18   Venancio Poisson, NP  tamsulosin (FLOMAX) 0.4 MG CAPS capsule Take 2 capsules (0.8 mg total) by mouth daily after supper. 09/15/17   Harrison Mons, PA    Family History Family History  Adopted: Yes  Family history unknown: Yes    Social History Social History   Tobacco Use  . Smoking status: Former Smoker    Packs/day: 1.00    Years: 13.00    Pack years: 13.00    Types: Cigarettes    Last attempt to quit: 01/23/1988    Years since quitting:  30.8  . Smokeless tobacco: Never Used  Substance Use Topics  . Alcohol use: Yes    Comment: quit 1989  . Drug use: No    Comment: previous cocaine and marijuana use, quit 1988     Allergies   Ibuprofen; Iodinated diagnostic agents; and Quinolones   Review of Systems Review of Systems  All other systems reviewed and are negative.    Physical Exam Updated Vital Signs BP (!) 185/86 (BP Location: Right Arm)   Pulse 69   Temp  98.7 F (37.1 C) (Oral)   Resp 14   Ht 1.93 m (6\' 4" )   Wt (!) 140.6 kg   SpO2 93%   BMI 37.73 kg/m   Physical Exam Vitals signs and nursing note reviewed.  Constitutional:      General: He is not in acute distress.    Appearance: Normal appearance. He is well-developed. He is not toxic-appearing.  HENT:     Head: Normocephalic and atraumatic.  Eyes:     General: Lids are normal.     Conjunctiva/sclera: Conjunctivae normal.     Pupils: Pupils are equal, round, and reactive to light.  Neck:     Musculoskeletal: Normal range of motion and neck supple.     Thyroid: No thyroid mass.     Trachea: No tracheal deviation.  Cardiovascular:     Rate and Rhythm: Normal rate and regular rhythm.     Heart sounds: Normal heart sounds. No murmur. No gallop.   Pulmonary:     Effort: Pulmonary effort is normal. No respiratory distress.     Breath sounds: Normal breath sounds. No stridor. No decreased breath sounds, wheezing, rhonchi or rales.  Abdominal:     General: Bowel sounds are normal. There is no distension.     Palpations: Abdomen is soft.     Tenderness: There is abdominal tenderness in the right upper quadrant and epigastric area. There is guarding. There is no rebound.    Musculoskeletal: Normal range of motion.        General: No tenderness.  Skin:    General: Skin is warm and dry.     Findings: No abrasion or rash.  Neurological:     Mental Status: He is alert and oriented to person, place, and time.     GCS: GCS eye subscore is 4. GCS  verbal subscore is 5. GCS motor subscore is 6.     Cranial Nerves: No cranial nerve deficit.     Sensory: No sensory deficit.  Psychiatric:        Speech: Speech normal.        Behavior: Behavior normal.      ED Treatments / Results  Labs (all labs ordered are listed, but only abnormal results are displayed) Labs Reviewed  CBC WITH DIFFERENTIAL/PLATELET  COMPREHENSIVE METABOLIC PANEL  LIPASE, BLOOD    EKG None  Radiology No results found.  Procedures Procedures (including critical care time)  Medications Ordered in ED Medications  0.9 %  sodium chloride infusion (has no administration in time range)  HYDROmorphone (DILAUDID) injection 1 mg (has no administration in time range)  LORazepam (ATIVAN) injection 1 mg (has no administration in time range)     Initial Impression / Assessment and Plan / ED Course  I have reviewed the triage vital signs and the nursing notes.  Pertinent labs & imaging results that were available during my care of the patient were reviewed by me and considered in my medical decision making (see chart for details).     Patient medicated for pain here and does feel better.  And ultrasound shows no evidence of Coley cystitis but does have cholelithiasis.  Patient's LFTs are normal.  Only mild leukocytosis 11.8 thousand.  Will be discharged home.  Patient already has surgical referral.  Final Clinical Impressions(s) / ED Diagnoses   Final diagnoses:  None    ED Discharge Orders    None       Lacretia Leigh, MD 11/19/18 2307

## 2018-11-23 ENCOUNTER — Other Ambulatory Visit: Payer: Self-pay | Admitting: Surgery

## 2018-11-23 ENCOUNTER — Inpatient Hospital Stay (HOSPITAL_COMMUNITY)
Admission: AD | Admit: 2018-11-23 | Discharge: 2018-11-27 | DRG: 419 | Disposition: A | Discharge status: Home Health | Payer: BLUE CROSS/BLUE SHIELD | Source: Ambulatory Visit | Attending: Surgery | Admitting: Surgery

## 2018-11-23 ENCOUNTER — Other Ambulatory Visit: Payer: Self-pay

## 2018-11-23 ENCOUNTER — Encounter (HOSPITAL_COMMUNITY): Payer: Self-pay | Admitting: *Deleted

## 2018-11-23 DIAGNOSIS — G4733 Obstructive sleep apnea (adult) (pediatric): Secondary | ICD-10-CM | POA: Diagnosis present

## 2018-11-23 DIAGNOSIS — E119 Type 2 diabetes mellitus without complications: Secondary | ICD-10-CM | POA: Diagnosis present

## 2018-11-23 DIAGNOSIS — Z91041 Radiographic dye allergy status: Secondary | ICD-10-CM

## 2018-11-23 DIAGNOSIS — K810 Acute cholecystitis: Secondary | ICD-10-CM | POA: Diagnosis not present

## 2018-11-23 DIAGNOSIS — I509 Heart failure, unspecified: Secondary | ICD-10-CM | POA: Diagnosis present

## 2018-11-23 DIAGNOSIS — Z6837 Body mass index (BMI) 37.0-37.9, adult: Secondary | ICD-10-CM | POA: Diagnosis not present

## 2018-11-23 DIAGNOSIS — Z8673 Personal history of transient ischemic attack (TIA), and cerebral infarction without residual deficits: Secondary | ICD-10-CM | POA: Diagnosis not present

## 2018-11-23 DIAGNOSIS — Z79899 Other long term (current) drug therapy: Secondary | ICD-10-CM

## 2018-11-23 DIAGNOSIS — K8000 Calculus of gallbladder with acute cholecystitis without obstruction: Principal | ICD-10-CM | POA: Diagnosis present

## 2018-11-23 DIAGNOSIS — G8929 Other chronic pain: Secondary | ICD-10-CM | POA: Diagnosis present

## 2018-11-23 DIAGNOSIS — E876 Hypokalemia: Secondary | ICD-10-CM | POA: Diagnosis present

## 2018-11-23 DIAGNOSIS — N400 Enlarged prostate without lower urinary tract symptoms: Secondary | ICD-10-CM | POA: Diagnosis present

## 2018-11-23 DIAGNOSIS — I110 Hypertensive heart disease with heart failure: Secondary | ICD-10-CM | POA: Diagnosis present

## 2018-11-23 DIAGNOSIS — K82A1 Gangrene of gallbladder in cholecystitis: Secondary | ICD-10-CM | POA: Diagnosis present

## 2018-11-23 DIAGNOSIS — E669 Obesity, unspecified: Secondary | ICD-10-CM | POA: Diagnosis present

## 2018-11-23 DIAGNOSIS — M170 Bilateral primary osteoarthritis of knee: Secondary | ICD-10-CM | POA: Diagnosis present

## 2018-11-23 DIAGNOSIS — E7800 Pure hypercholesterolemia, unspecified: Secondary | ICD-10-CM | POA: Diagnosis not present

## 2018-11-23 DIAGNOSIS — Z7984 Long term (current) use of oral hypoglycemic drugs: Secondary | ICD-10-CM | POA: Diagnosis not present

## 2018-11-23 DIAGNOSIS — R079 Chest pain, unspecified: Secondary | ICD-10-CM | POA: Diagnosis present

## 2018-11-23 DIAGNOSIS — I10 Essential (primary) hypertension: Secondary | ICD-10-CM | POA: Diagnosis not present

## 2018-11-23 DIAGNOSIS — R109 Unspecified abdominal pain: Secondary | ICD-10-CM | POA: Diagnosis present

## 2018-11-23 DIAGNOSIS — Z951 Presence of aortocoronary bypass graft: Secondary | ICD-10-CM

## 2018-11-23 DIAGNOSIS — K219 Gastro-esophageal reflux disease without esophagitis: Secondary | ICD-10-CM | POA: Diagnosis present

## 2018-11-23 DIAGNOSIS — I2510 Atherosclerotic heart disease of native coronary artery without angina pectoris: Secondary | ICD-10-CM | POA: Diagnosis present

## 2018-11-23 DIAGNOSIS — Z87891 Personal history of nicotine dependence: Secondary | ICD-10-CM

## 2018-11-23 DIAGNOSIS — J449 Chronic obstructive pulmonary disease, unspecified: Secondary | ICD-10-CM | POA: Diagnosis present

## 2018-11-23 DIAGNOSIS — Z7982 Long term (current) use of aspirin: Secondary | ICD-10-CM

## 2018-11-23 DIAGNOSIS — Z888 Allergy status to other drugs, medicaments and biological substances status: Secondary | ICD-10-CM | POA: Diagnosis not present

## 2018-11-23 DIAGNOSIS — E785 Hyperlipidemia, unspecified: Secondary | ICD-10-CM | POA: Diagnosis present

## 2018-11-23 DIAGNOSIS — Z886 Allergy status to analgesic agent status: Secondary | ICD-10-CM | POA: Diagnosis not present

## 2018-11-23 LAB — COMPREHENSIVE METABOLIC PANEL
ALT: 46 U/L — ABNORMAL HIGH (ref 0–44)
AST: 31 U/L (ref 15–41)
Albumin: 3.5 g/dL (ref 3.5–5.0)
Alkaline Phosphatase: 107 U/L (ref 38–126)
Anion gap: 12 (ref 5–15)
BUN: 22 mg/dL — ABNORMAL HIGH (ref 6–20)
CO2: 26 mmol/L (ref 22–32)
Calcium: 8.8 mg/dL — ABNORMAL LOW (ref 8.9–10.3)
Chloride: 100 mmol/L (ref 98–111)
Creatinine, Ser: 1.02 mg/dL (ref 0.61–1.24)
GFR calc Af Amer: >60 mL/min (ref >60)
GFR calc non Af Amer: >60 mL/min (ref >60)
Glucose, Bld: 153 mg/dL — ABNORMAL HIGH (ref 70–99)
Potassium: 3 mmol/L — ABNORMAL LOW (ref 3.5–5.1)
Sodium: 138 mmol/L (ref 135–145)
Total Bilirubin: 1.7 mg/dL — ABNORMAL HIGH (ref 0.3–1.2)
Total Protein: 7.3 g/dL (ref 6.5–8.1)

## 2018-11-23 LAB — CBC
HCT: 38.9 % — ABNORMAL LOW (ref 39.0–52.0)
Hemoglobin: 12.8 g/dL — ABNORMAL LOW (ref 13.0–17.0)
MCH: 28.6 pg (ref 26.0–34.0)
MCHC: 32.9 g/dL (ref 30.0–36.0)
MCV: 87 fL (ref 80.0–100.0)
Platelets: 296 10*3/uL (ref 150–400)
RBC: 4.47 MIL/uL (ref 4.22–5.81)
RDW: 14.2 % (ref 11.5–15.5)
WBC: 22.5 10*3/uL — ABNORMAL HIGH (ref 4.0–10.5)
nRBC: 0 % (ref 0.0–0.2)

## 2018-11-23 LAB — GLUCOSE, CAPILLARY
Glucose-Capillary: 136 mg/dL — ABNORMAL HIGH (ref 70–99)
Glucose-Capillary: 137 mg/dL — ABNORMAL HIGH (ref 70–99)

## 2018-11-23 LAB — LIPASE, BLOOD: Lipase: 38 U/L (ref 11–51)

## 2018-11-23 MED ORDER — MUPIROCIN 2 % EX OINT
1.0000 "application " | TOPICAL_OINTMENT | Freq: Two times a day (BID) | CUTANEOUS | Status: DC
  Administered 2018-11-23 – 2018-11-27 (×4): 1 via NASAL
  Filled 2018-11-23: qty 22

## 2018-11-23 MED ORDER — ONDANSETRON HCL 4 MG/2ML IJ SOLN: 4.0000 mg | Freq: Four times a day (QID) | INTRAMUSCULAR | Status: DC | PRN

## 2018-11-23 MED ORDER — BUPROPION HCL ER (XL) 150 MG PO TB24
150.0000 mg | ORAL_TABLET | Freq: Every day | ORAL | Status: DC
  Administered 2018-11-23 – 2018-11-27 (×4): 150 mg via ORAL
  Filled 2018-11-23 (×4): qty 1

## 2018-11-23 MED ORDER — SERTRALINE HCL 50 MG PO TABS
150.0000 mg | ORAL_TABLET | Freq: Every day | ORAL | Status: DC
  Administered 2018-11-23 – 2018-11-27 (×4): 150 mg via ORAL
  Filled 2018-11-23 (×4): qty 1

## 2018-11-23 MED ORDER — ONDANSETRON 4 MG PO TBDP: 4.0000 mg | ORAL_TABLET | Freq: Four times a day (QID) | ORAL | Status: DC | PRN

## 2018-11-23 MED ORDER — PIPERACILLIN-TAZOBACTAM 3.375 G IVPB
3.3750 g | Freq: Three times a day (TID) | INTRAVENOUS | Status: DC
  Administered 2018-11-23 – 2018-11-26 (×8): 3.375 g via INTRAVENOUS
  Filled 2018-11-23 (×10): qty 50

## 2018-11-23 MED ORDER — ATORVASTATIN CALCIUM 40 MG PO TABS
80.0000 mg | ORAL_TABLET | Freq: Every day | ORAL | Status: DC
  Administered 2018-11-23 – 2018-11-26 (×4): 80 mg via ORAL
  Filled 2018-11-23 (×4): qty 2

## 2018-11-23 MED ORDER — HYDRALAZINE HCL 20 MG/ML IJ SOLN: 5.0000 mg | Freq: Four times a day (QID) | INTRAMUSCULAR | Status: DC | PRN

## 2018-11-23 MED ORDER — LORATADINE 10 MG PO TABS
10.0000 mg | ORAL_TABLET | Freq: Every day | ORAL | Status: DC
  Administered 2018-11-23 – 2018-11-26 (×4): 10 mg via ORAL
  Filled 2018-11-23 (×4): qty 1

## 2018-11-23 MED ORDER — AMITRIPTYLINE HCL 25 MG PO TABS
25.0000 mg | ORAL_TABLET | Freq: Every day | ORAL | Status: DC
  Administered 2018-11-23 – 2018-11-26 (×4): 25 mg via ORAL
  Filled 2018-11-23 (×4): qty 1

## 2018-11-23 MED ORDER — MIRABEGRON ER 25 MG PO TB24
50.0000 mg | ORAL_TABLET | Freq: Every day | ORAL | Status: DC
  Administered 2018-11-23 – 2018-11-26 (×4): 50 mg via ORAL
  Filled 2018-11-23 (×4): qty 2

## 2018-11-23 MED ORDER — CARVEDILOL 12.5 MG PO TABS
12.5000 mg | ORAL_TABLET | Freq: Two times a day (BID) | ORAL | Status: DC
  Administered 2018-11-23 – 2018-11-27 (×8): 12.5 mg via ORAL
  Filled 2018-11-23 (×8): qty 1

## 2018-11-23 MED ORDER — ACETAMINOPHEN 500 MG PO TABS
1000.0000 mg | ORAL_TABLET | Freq: Three times a day (TID) | ORAL | Status: DC
  Administered 2018-11-23 – 2018-11-27 (×11): 1000 mg via ORAL
  Filled 2018-11-23 (×11): qty 2

## 2018-11-23 MED ORDER — FUROSEMIDE 40 MG PO TABS
40.0000 mg | ORAL_TABLET | Freq: Every day | ORAL | Status: DC
  Administered 2018-11-25 – 2018-11-27 (×3): 40 mg via ORAL
  Filled 2018-11-23 (×4): qty 1

## 2018-11-23 MED ORDER — LIDOCAINE 5 % EX OINT
1.0000 "application " | TOPICAL_OINTMENT | Freq: Two times a day (BID) | CUTANEOUS | Status: DC | PRN
  Filled 2018-11-23: qty 35.44

## 2018-11-23 MED ORDER — POTASSIUM CHLORIDE IN NACL 20-0.9 MEQ/L-% IV SOLN
INTRAVENOUS | Status: DC
  Administered 2018-11-23 – 2018-11-25 (×2): via INTRAVENOUS
  Filled 2018-11-23 (×4): qty 1000

## 2018-11-23 MED ORDER — DIPHENHYDRAMINE HCL 50 MG/ML IJ SOLN: 12.5000 mg | Freq: Four times a day (QID) | INTRAMUSCULAR | Status: DC | PRN

## 2018-11-23 MED ORDER — ENOXAPARIN SODIUM 40 MG/0.4ML ~~LOC~~ SOLN
40.0000 mg | SUBCUTANEOUS | Status: DC
  Administered 2018-11-23: 40 mg via SUBCUTANEOUS
  Filled 2018-11-23: qty 0.4

## 2018-11-23 MED ORDER — TAMSULOSIN HCL 0.4 MG PO CAPS
0.8000 mg | ORAL_CAPSULE | Freq: Every day | ORAL | Status: DC
  Administered 2018-11-23 – 2018-11-26 (×4): 0.8 mg via ORAL
  Filled 2018-11-23 (×4): qty 2

## 2018-11-23 MED ORDER — LISINOPRIL 20 MG PO TABS
20.0000 mg | ORAL_TABLET | Freq: Every day | ORAL | Status: DC
  Administered 2018-11-23 – 2018-11-27 (×4): 20 mg via ORAL
  Filled 2018-11-23 (×4): qty 1

## 2018-11-23 MED ORDER — ACETAMINOPHEN 325 MG PO TABS: 650.0000 mg | ORAL_TABLET | Freq: Four times a day (QID) | ORAL | Status: DC | PRN

## 2018-11-23 MED ORDER — FELODIPINE ER 10 MG PO TB24
10.0000 mg | ORAL_TABLET | Freq: Every day | ORAL | Status: DC
  Administered 2018-11-23 – 2018-11-27 (×4): 10 mg via ORAL
  Filled 2018-11-23 (×5): qty 1

## 2018-11-23 MED ORDER — INSULIN ASPART 100 UNIT/ML ~~LOC~~ SOLN
0.0000 [IU] | SUBCUTANEOUS | Status: DC
  Administered 2018-11-23 – 2018-11-24 (×5): 2 [IU] via SUBCUTANEOUS

## 2018-11-23 MED ORDER — HYDROMORPHONE HCL 1 MG/ML IJ SOLN
1.0000 mg | INTRAMUSCULAR | Status: DC | PRN
  Administered 2018-11-24: 1 mg via INTRAVENOUS
  Filled 2018-11-23: qty 1

## 2018-11-23 NOTE — Progress Notes (Signed)
Patient refuses cpap per day RT

## 2018-11-23 NOTE — H&P (Signed)
Jonathan Miles is an 58 y.o. male.   Chief Complaint: abdominal pain HPI: pt is a 58 y/o who developed acute pain on 11/19/18.  He was seen in the ED, symptoms were similar to findings 06/15/17.  A diagnosis of cholelithiasis was made and it was recommended he undergo cholecystectomy but he had just had his appendix out and was not ready.  Abd [place] on 1/24:Cholelithiasis. No other sonographic features to suggest acute cholecystitis. No biliary dilatation. Mildly increased echogenicity within the hepatic parenchyma, suggesting steatosis. Otherwise unremarkable and normal abdominal ultrasound.  Lipase 34, CMP was normal, WBC 11.8 H/H 13.4/40.4.  He was sent home with Percocet which helped.  He has not been able to eat or drink much all weekend because of the pain and nausea.  No vomiting since Friday 1/24.  He was seen in our office today with ongoing pain and nausea with PO intake.  Dr. Ninfa Linden recommended admission for acute cholecystitis, cholelithiasis.    Past Medical History:  Diagnosis Date  . Alcohol abuse   . Aortic root dilatation (HCC)    a. echo 4/12: EF 55-60%, mod to marked dilated ascending aortic root;   b. MRA 4/12: Aortic root 5.5 cm, dilation of left and right coronary cusps, trileaflet aortic valve // Echo 2/19: Moderate LVH, EF 55-60, normal wall motion, grade 1 diastolic dysfunction, mild AI, mildly dilated aortic root, moderate to severe LAE (aortic root 40 mm; ascending aorta 39 mm)   AORTIC VALVE SPARING ASCENDING AORTIC ROOT REPLACEMENT             -44mm Hemashield graft  CORONARY ARTERY BYPASS GRAFTING X1             SVG TO RCA  04/2012  Dr. Lanelle Bal  . Arthritis   . Asthma    ast attack in early 20's  . CAD (coronary artery disease)   . Chest pain    Myoview 4/12: EF 62%, no ischemia or scar  . CHF (congestive heart failure) ([place])   . Chronic pain    back and hands  . Constipation   . COPD (chronic obstructive pulmonary disease) ([place])   . Depression   .  Diabetes mellitus    type 2  . Difficulty speaking   . Drug use   . Enlarged prostate   . Fatigue   . GERD (gastroesophageal reflux disease)    modifies with diet  . Hx of echocardiogram    Echo (2/16):  Mod LVH, EF 55-60%, mild to mod AI, mod LAE, mild RAE, Aortic root 38 mm, Asc aorta 43 mm  . Hyperlipidemia   . Hypertension   . Joint pain   . Knee pain, bilateral   . Leg pain    ABIs 3/19: Normal (R 1.3; L 1.28)  . OSA (obstructive sleep apnea) 2006   .  Wears at times  . Osteoarthritis   . Palpitations   . Stroke ([place]) BMI 37.73     Past Surgical History:  Procedure Laterality Date  . APPENDECTOMY  12/2016  . Colonscopy    . CORONARY ARTERY BYPASS GRAFT  05/07/2012   Procedure: CORONARY ARTERY BYPASS GRAFTING (CABG);  Surgeon: Grace Isaac, MD;  Location: [place];  Service: Open Heart Surgery;  Laterality: N/A;  . KNEE ARTHROSCOPY Left 2010   Dr. Rosamaria Lints  . LAPAROSCOPIC APPENDECTOMY N/A 01/06/2017   Procedure: APPENDECTOMY LAPAROSCOPIC;  Surgeon: Ralene Ok, MD;  Location: [place];  Service: General;  Laterality: N/A;  . Retactment  of fingers     as a child , sewed with sewing machine- Index and middle finger  . UPPER GASTROINTESTINAL ENDOSCOPY      Family History  Adopted: Yes  Family history unknown: Yes   Social History:  reports that he quit smoking about 30 years ago. His smoking use included cigarettes. He has a 13.00 pack-year smoking history. He has never used smokeless tobacco. He reports current alcohol use. He reports that he does not use drugs. Tobacco  12 years, quit at 23 - 1PPD ETOH: social Drugs:  Marijuana, Cocaine - none for 30 years Married Works Insurance underwriter at [employer]:  Allergies  Allergen Reactions  . Ibuprofen Other (See Comments)    TOLD NOT TO TAKE-PER CARDS  . Iodinated Diagnostic Agents Hives    Hives started 4 days after cta chest was performed, minimal relief w/ benadryl x 3 days, uncertain  if reaction was actually iv contrast or other allergen,allergy tests to follow.wife will make [place] aware of results//a.[place]  . Quinolones     Medications Prior to Admission  Medication Sig Dispense Refill  . acetaminophen (TYLENOL) 500 MG tablet TAKE 1-2 TABS EVERY 6 HOURS AS NEEDED FOR PAIN (Patient taking differently: Take 500-1,000 mg by mouth 2 (two) times daily as needed for mild pain. )    . amitriptyline (ELAVIL) 25 MG tablet Take 1 tablet (25 mg total) by mouth at bedtime. 30 tablet 2  . aspirin 325 MG EC tablet Take 1 tablet (325 mg total) by mouth at bedtime. 90 tablet 3  . atorvastatin (LIPITOR) 80 MG tablet Take 1 tablet (80 mg total) by mouth daily at 6 PM. 90 tablet 3  . buPROPion (WELLBUTRIN XL) 150 MG 24 hr tablet Take 150 mg by mouth daily.     . carvedilol (COREG) 12.5 MG tablet TAKE ONE TABLET BY MOUTH TWO TIMES A DAY (Patient taking differently: Take 12.5 mg by mouth 2 (two) times daily. ) 180 tablet 2  . felodipine (PLENDIL) 10 MG 24 hr tablet TAKE ONE TABLET BY MOUTH DAILY (Patient taking differently: Take 10 mg by mouth daily. ) 90 tablet 2  . furosemide (LASIX) 40 MG tablet TAKE ONE TABLET BY MOUTH DAILY (Patient taking differently: Take 40 mg by mouth daily. ) 90 tablet 2  . lidocaine (XYLOCAINE) 5 % ointment Apply 1 application topically as needed. Daily for leg pain 50 g 3  . lisinopril (PRINIVIL,ZESTRIL) 20 MG tablet TAKE ONE TABLET BY MOUTH DAILY (Patient taking differently: Take 20 mg by mouth daily. ) 90 tablet 2  . loratadine (CLARITIN) 10 MG tablet Take 10 mg by mouth at bedtime.     . Menthol-Methyl Salicylate (THERA-GESIC) 0.5-15 % CREA Apply 1 application topically daily as needed (pain).    . metFORMIN (GLUCOPHAGE) 1000 MG tablet Take 1 tablet (1,000 mg total) by mouth 2 (two) times daily with a meal. 180 tablet 3  . mirabegron ER (MYRBETRIQ) 50 MG TB24 tablet Take 50 mg by mouth at bedtime.     Marland Kitchen oxyCODONE-acetaminophen (PERCOCET/ROXICET) 5-325 MG tablet  Take 2 tablets by mouth every 4 (four) hours as needed for severe pain. 15 tablet 0  . sertraline (ZOLOFT) 100 MG tablet Take 1.5 tablets (150 mg total) by mouth daily. 135 tablet 3  . tamsulosin (FLOMAX) 0.4 MG CAPS capsule Take 2 capsules (0.8 mg total) by mouth daily after supper. 180 capsule 3  . Trolamine Salicylate (ASPERCREME EX) Apply 1 application topically  daily as needed (pain).    . Blood Pressure Monitoring (BLOOD PRESSURE MONITOR/L CUFF) MISC Please check blood pressure 2x per day 1 each 0    Results for orders placed or performed during the hospital encounter of 11/23/18 (from the past 48 hour(s))  CBC     Status: Abnormal   Collection Time: 11/23/18  2:37 PM  Result Value Ref Range   WBC 22.5 (H) 4.0 - 10.5 K/uL   RBC 4.47 4.22 - 5.81 MIL/uL   Hemoglobin 12.8 (L) 13.0 - 17.0 g/dL   HCT 38.9 (L) 39.0 - 52.0 %   MCV 87.0 80.0 - 100.0 fL   MCH 28.6 26.0 - 34.0 pg   MCHC 32.9 30.0 - 36.0 g/dL   RDW 14.2 11.5 - 15.5 %   Platelets 296 150 - 400 K/uL   nRBC 0.0 0.0 - 0.2 %    Comment: Performed at [facility], [place] [address], [place], [place] [address]  Comprehensive metabolic panel     Status: Abnormal   Collection Time: 11/23/18  2:37 PM  Result Value Ref Range   Sodium 138 135 - 145 mmol/L   Potassium 3.0 (L) 3.5 - 5.1 mmol/L   Chloride 100 98 - 111 mmol/L   CO2 26 22 - 32 mmol/L   Glucose, Bld 153 (H) 70 - 99 mg/dL   BUN 22 (H) 6 - 20 mg/dL   Creatinine, Ser 1.02 0.61 - 1.24 mg/dL   Calcium 8.8 (L) 8.9 - 10.3 mg/dL   Total Protein 7.3 6.5 - 8.1 g/dL   Albumin 3.5 3.5 - 5.0 g/dL   AST 31 15 - 41 U/L   ALT 46 (H) 0 - 44 U/L   Alkaline Phosphatase 107 38 - 126 U/L   Total Bilirubin 1.7 (H) 0.3 - 1.2 mg/dL   GFR calc non Af Amer >60 >60 mL/min   GFR calc Af Amer >60 >60 mL/min   Anion gap 12 5 - 15    Comment: Performed at [facility], [place] [address], [place], [place] [address]   No results found.  Review of Systems   Constitutional: Negative for chills, diaphoresis, fever, malaise/fatigue and weight loss.  HENT: Negative.   Eyes: Negative.   Respiratory: Positive for shortness of breath (DOE 1/2 block waling). Negative for wheezing.        DOE on CPAP  Cardiovascular: Positive for orthopnea.  Gastrointestinal: Positive for abdominal pain (pain better than Friday, had similar episode about 2 years ago, cholecystectomy recommended), heartburn (OCCASIONAL), nausea (Nausea and vomiting on Friday, ongoing pain since Friday) and vomiting. Negative for blood in stool, constipation, diarrhea and melena.       Colonoscopy about 8 years ago  Genitourinary: Negative.   Musculoskeletal: Positive for back pain and joint pain (both knees are bad needs left replaced some time in the future).  Skin: Negative.   Neurological:       Completed rehab and function reported back to normal, but pt does not feel right side is back to normal, on disability  Endo/Heme/Allergies: Bruises/bleeds easily.  Psychiatric/Behavioral: Positive for depression. The patient is nervous/anxious.     Blood pressure (!) 151/76, pulse 87, temperature 99.6 F (37.6 C), temperature source Oral, resp. rate 18, SpO2 94 %. Physical Exam  Constitutional: He is oriented to person, place, and time. He appears well-developed and well-nourished. No distress.  HENT:  Head: Normocephalic.  Mouth/Throat: Oropharynx is clear and moist. No oropharyngeal exudate.  Eyes: Pupils  are equal, round, and reactive to light. Conjunctivae and EOM are normal. Right eye exhibits no discharge. Left eye exhibits no discharge. No scleral icterus.  Neck: Normal range of motion. Neck supple. No JVD present. No tracheal deviation present. No thyromegaly present.  Cardiovascular: Normal rate, regular rhythm and intact distal pulses.  No murmur heard. Crisp valve sounds Mid sternotomy site is well healed with keloids  Respiratory: Effort normal and breath sounds normal. No  respiratory distress. He has no wheezes. He has no rales. He exhibits no tenderness.  GI: Soft. Bowel sounds are normal. He exhibits no distension and no mass. There is abdominal tenderness (RUQ). There is no rebound and no guarding.  Musculoskeletal:        General: No tenderness or edema.  Lymphadenopathy:    He has no cervical adenopathy.  Neurological: He is alert and oriented to person, place, and time. No cranial nerve deficit.  Skin: Skin is warm and dry. No rash noted. He is not diaphoretic. No erythema. No pallor.  Psychiatric: He has a normal mood and affect. His behavior is normal. Judgment and thought content normal.     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 CAB x 1 to RCA, REPAIR OF PATENT FORAMEN OVALE, 05/07/12 - Dr Lanelle Bal Hypertension Hyperlipidemia Diabetes Type II OSA - CPAP Severe arthritis - knees Hx asthma/tobacco use  Plan:  Clears for now, NPO after MN, labs ordered, resume preadmit meds as before, except metformin - SSI for now. Cardiology consult for surgical clearance.  He is tentatively set up for surgery tomorrow around noon.   Branden Shallenberger, PA-C 11/23/2018, 3:24 PM

## 2018-11-24 ENCOUNTER — Other Ambulatory Visit: Payer: Self-pay

## 2018-11-24 ENCOUNTER — Inpatient Hospital Stay (HOSPITAL_COMMUNITY): Payer: BLUE CROSS/BLUE SHIELD | Admitting: Anesthesiology

## 2018-11-24 ENCOUNTER — Encounter (HOSPITAL_COMMUNITY): Admission: AD | Disposition: A | Discharge status: Home Health | Payer: Self-pay | Source: Ambulatory Visit

## 2018-11-24 ENCOUNTER — Inpatient Hospital Stay (HOSPITAL_COMMUNITY): Payer: BLUE CROSS/BLUE SHIELD

## 2018-11-24 ENCOUNTER — Inpatient Hospital Stay (HOSPITAL_COMMUNITY)
Admission: RE | Admit: 2018-11-24 | Payer: BLUE CROSS/BLUE SHIELD | Source: Home / Self Care | Admitting: General Surgery

## 2018-11-24 ENCOUNTER — Encounter (HOSPITAL_COMMUNITY): Payer: Self-pay | Admitting: Emergency Medicine

## 2018-11-24 DIAGNOSIS — I10 Essential (primary) hypertension: Secondary | ICD-10-CM

## 2018-11-24 DIAGNOSIS — E7800 Pure hypercholesterolemia, unspecified: Secondary | ICD-10-CM

## 2018-11-24 DIAGNOSIS — K810 Acute cholecystitis: Secondary | ICD-10-CM

## 2018-11-24 DIAGNOSIS — I2510 Atherosclerotic heart disease of native coronary artery without angina pectoris: Secondary | ICD-10-CM

## 2018-11-24 HISTORY — PX: CHOLECYSTECTOMY: SHX55

## 2018-11-24 LAB — CBC
HCT: 35.7 % — ABNORMAL LOW (ref 39.0–52.0)
Hemoglobin: 11.6 g/dL — ABNORMAL LOW (ref 13.0–17.0)
MCH: 28.5 pg (ref 26.0–34.0)
MCHC: 32.5 g/dL (ref 30.0–36.0)
MCV: 87.7 fL (ref 80.0–100.0)
Platelets: 253 10*3/uL (ref 150–400)
RBC: 4.07 MIL/uL — ABNORMAL LOW (ref 4.22–5.81)
RDW: 14.3 % (ref 11.5–15.5)
WBC: 21 10*3/uL — ABNORMAL HIGH (ref 4.0–10.5)
nRBC: 0 % (ref 0.0–0.2)

## 2018-11-24 LAB — GLUCOSE, CAPILLARY
Glucose-Capillary: 121 mg/dL — ABNORMAL HIGH (ref 70–99)
Glucose-Capillary: 123 mg/dL — ABNORMAL HIGH (ref 70–99)
Glucose-Capillary: 124 mg/dL — ABNORMAL HIGH (ref 70–99)
Glucose-Capillary: 140 mg/dL — ABNORMAL HIGH (ref 70–99)
Glucose-Capillary: 142 mg/dL — ABNORMAL HIGH (ref 70–99)
Glucose-Capillary: 151 mg/dL — ABNORMAL HIGH (ref 70–99)
Glucose-Capillary: 153 mg/dL — ABNORMAL HIGH (ref 70–99)
Glucose-Capillary: 194 mg/dL — ABNORMAL HIGH (ref 70–99)
Glucose-Capillary: 208 mg/dL — ABNORMAL HIGH (ref 70–99)

## 2018-11-24 LAB — COMPREHENSIVE METABOLIC PANEL
ALT: 57 U/L — ABNORMAL HIGH (ref 0–44)
AST: 43 U/L — ABNORMAL HIGH (ref 15–41)
Albumin: 2.8 g/dL — ABNORMAL LOW (ref 3.5–5.0)
Alkaline Phosphatase: 112 U/L (ref 38–126)
Anion gap: 9 (ref 5–15)
BUN: 18 mg/dL (ref 6–20)
CO2: 25 mmol/L (ref 22–32)
Calcium: 8.3 mg/dL — ABNORMAL LOW (ref 8.9–10.3)
Chloride: 104 mmol/L (ref 98–111)
Creatinine, Ser: 0.89 mg/dL (ref 0.61–1.24)
GFR calc Af Amer: >60 mL/min (ref >60)
GFR calc non Af Amer: >60 mL/min (ref >60)
Glucose, Bld: 150 mg/dL — ABNORMAL HIGH (ref 70–99)
Potassium: 3 mmol/L — ABNORMAL LOW (ref 3.5–5.1)
Sodium: 138 mmol/L (ref 135–145)
Total Bilirubin: 1.3 mg/dL — ABNORMAL HIGH (ref 0.3–1.2)
Total Protein: 6.4 g/dL — ABNORMAL LOW (ref 6.5–8.1)

## 2018-11-24 LAB — SURGICAL PCR SCREEN
MRSA, PCR: NEGATIVE
Staphylococcus aureus: POSITIVE — AB

## 2018-11-24 SURGERY — LAPAROSCOPIC CHOLECYSTECTOMY WITH INTRAOPERATIVE CHOLANGIOGRAM
Anesthesia: General | Site: Abdomen

## 2018-11-24 MED ORDER — OXYCODONE HCL 5 MG/5ML PO SOLN
5.0000 mg | Freq: Once | ORAL | Status: DC | PRN
  Filled 2018-11-24: qty 5

## 2018-11-24 MED ORDER — 0.9 % SODIUM CHLORIDE (POUR BTL) OPTIME
TOPICAL | Status: DC | PRN
  Administered 2018-11-24: 1000 mL

## 2018-11-24 MED ORDER — LIDOCAINE HCL (CARDIAC) PF 100 MG/5ML IV SOSY
PREFILLED_SYRINGE | INTRAVENOUS | Status: DC | PRN
  Administered 2018-11-24: 100 mg via INTRAVENOUS

## 2018-11-24 MED ORDER — LIDOCAINE HCL (PF) 1 % IJ SOLN
INTRAMUSCULAR | Status: DC | PRN
  Administered 2018-11-24: 10 mL

## 2018-11-24 MED ORDER — ROCURONIUM BROMIDE 100 MG/10ML IV SOLN
INTRAVENOUS | Status: DC | PRN
  Administered 2018-11-24: 70 mg via INTRAVENOUS

## 2018-11-24 MED ORDER — FENTANYL CITRATE (PF) 250 MCG/5ML IJ SOLN
INTRAMUSCULAR | Status: DC | PRN
  Administered 2018-11-24 (×3): 50 ug via INTRAVENOUS
  Administered 2018-11-24: 100 ug via INTRAVENOUS

## 2018-11-24 MED ORDER — POTASSIUM CHLORIDE 10 MEQ/100ML IV SOLN: 10.0000 meq | INTRAVENOUS | Status: DC

## 2018-11-24 MED ORDER — FENTANYL CITRATE (PF) 100 MCG/2ML IJ SOLN: 25.0000 ug | INTRAMUSCULAR | Status: DC | PRN

## 2018-11-24 MED ORDER — PROPOFOL 10 MG/ML IV BOLUS
INTRAVENOUS | Status: AC
  Filled 2018-11-24: qty 20

## 2018-11-24 MED ORDER — ROCURONIUM BROMIDE 100 MG/10ML IV SOLN
INTRAVENOUS | Status: AC
  Filled 2018-11-24: qty 1

## 2018-11-24 MED ORDER — DEXAMETHASONE SODIUM PHOSPHATE 10 MG/ML IJ SOLN
INTRAMUSCULAR | Status: AC
  Filled 2018-11-24: qty 1

## 2018-11-24 MED ORDER — PROPOFOL 10 MG/ML IV BOLUS
INTRAVENOUS | Status: DC | PRN
  Administered 2018-11-24: 200 mg via INTRAVENOUS

## 2018-11-24 MED ORDER — SUGAMMADEX SODIUM 500 MG/5ML IV SOLN
INTRAVENOUS | Status: DC | PRN
  Administered 2018-11-24: 300 mg via INTRAVENOUS

## 2018-11-24 MED ORDER — FENTANYL CITRATE (PF) 250 MCG/5ML IJ SOLN
INTRAMUSCULAR | Status: AC
  Filled 2018-11-24: qty 5

## 2018-11-24 MED ORDER — BUPIVACAINE-EPINEPHRINE (PF) 0.25% -1:200000 IJ SOLN
INTRAMUSCULAR | Status: DC | PRN
  Administered 2018-11-24: 10 mL via PERINEURAL

## 2018-11-24 MED ORDER — BUPIVACAINE-EPINEPHRINE (PF) 0.25% -1:200000 IJ SOLN
INTRAMUSCULAR | Status: AC
  Filled 2018-11-24: qty 30

## 2018-11-24 MED ORDER — IOPAMIDOL (ISOVUE-300) INJECTION 61%
INTRAVENOUS | Status: DC | PRN
  Administered 2018-11-24: 50 mL via INTRAVENOUS

## 2018-11-24 MED ORDER — ONDANSETRON HCL 4 MG/2ML IJ SOLN
INTRAMUSCULAR | Status: DC | PRN
  Administered 2018-11-24: 4 mg via INTRAVENOUS

## 2018-11-24 MED ORDER — DIPHENHYDRAMINE HCL 50 MG/ML IJ SOLN
INTRAMUSCULAR | Status: AC
  Filled 2018-11-24: qty 1

## 2018-11-24 MED ORDER — MIDAZOLAM HCL 2 MG/2ML IJ SOLN
INTRAMUSCULAR | Status: AC
  Filled 2018-11-24: qty 2

## 2018-11-24 MED ORDER — EPHEDRINE SULFATE-NACL 50-0.9 MG/10ML-% IV SOSY
PREFILLED_SYRINGE | INTRAVENOUS | Status: DC | PRN
  Administered 2018-11-24: 10 mg via INTRAVENOUS

## 2018-11-24 MED ORDER — MIDAZOLAM HCL 2 MG/2ML IJ SOLN
INTRAMUSCULAR | Status: DC | PRN
  Administered 2018-11-24: 2 mg via INTRAVENOUS

## 2018-11-24 MED ORDER — INSULIN ASPART 100 UNIT/ML ~~LOC~~ SOLN
0.0000 [IU] | Freq: Three times a day (TID) | SUBCUTANEOUS | Status: DC
  Administered 2018-11-24: 3 [IU] via SUBCUTANEOUS
  Administered 2018-11-25: 5 [IU] via SUBCUTANEOUS
  Administered 2018-11-25: 3 [IU] via SUBCUTANEOUS
  Administered 2018-11-25 – 2018-11-26 (×2): 2 [IU] via SUBCUTANEOUS
  Administered 2018-11-26: 3 [IU] via SUBCUTANEOUS
  Administered 2018-11-27: 2 [IU] via SUBCUTANEOUS

## 2018-11-24 MED ORDER — SODIUM CHLORIDE 0.9 % IV SOLN
INTRAVENOUS | Status: DC | PRN
  Administered 2018-11-24: 40 ug/min via INTRAVENOUS

## 2018-11-24 MED ORDER — PHENYLEPHRINE 40 MCG/ML (10ML) SYRINGE FOR IV PUSH (FOR BLOOD PRESSURE SUPPORT)
PREFILLED_SYRINGE | INTRAVENOUS | Status: DC | PRN
  Administered 2018-11-24 (×3): 80 ug via INTRAVENOUS

## 2018-11-24 MED ORDER — EPHEDRINE 5 MG/ML INJ
INTRAVENOUS | Status: AC
  Filled 2018-11-24: qty 10

## 2018-11-24 MED ORDER — LIDOCAINE 2% (20 MG/ML) 5 ML SYRINGE
INTRAMUSCULAR | Status: AC
  Filled 2018-11-24: qty 5

## 2018-11-24 MED ORDER — PROMETHAZINE HCL 25 MG/ML IJ SOLN: 6.2500 mg | INTRAMUSCULAR | Status: DC | PRN

## 2018-11-24 MED ORDER — DEXAMETHASONE SODIUM PHOSPHATE 10 MG/ML IJ SOLN
INTRAMUSCULAR | Status: DC | PRN
  Administered 2018-11-24: 8 mg via INTRAVENOUS

## 2018-11-24 MED ORDER — POTASSIUM CHLORIDE 10 MEQ/100ML IV SOLN
10.0000 meq | Freq: Once | INTRAVENOUS | Status: AC
  Administered 2018-11-24: 10 meq via INTRAVENOUS
  Filled 2018-11-24: qty 100

## 2018-11-24 MED ORDER — OXYCODONE HCL 5 MG PO TABS
5.0000 mg | ORAL_TABLET | ORAL | Status: DC | PRN
  Administered 2018-11-24 – 2018-11-26 (×3): 5 mg via ORAL
  Filled 2018-11-24 (×3): qty 1

## 2018-11-24 MED ORDER — HYDROMORPHONE HCL 1 MG/ML IJ SOLN
1.0000 mg | INTRAMUSCULAR | Status: DC | PRN
  Administered 2018-11-24: 1 mg via INTRAVENOUS
  Filled 2018-11-24: qty 1

## 2018-11-24 MED ORDER — PHENYLEPHRINE 40 MCG/ML (10ML) SYRINGE FOR IV PUSH (FOR BLOOD PRESSURE SUPPORT)
PREFILLED_SYRINGE | INTRAVENOUS | Status: AC
  Filled 2018-11-24: qty 10

## 2018-11-24 MED ORDER — LACTATED RINGERS IV SOLN
INTRAVENOUS | Status: DC | PRN
  Administered 2018-11-24: 12:00:00 via INTRAVENOUS

## 2018-11-24 MED ORDER — IOPAMIDOL (ISOVUE-300) INJECTION 61%
INTRAVENOUS | Status: AC
  Filled 2018-11-24: qty 50

## 2018-11-24 MED ORDER — LACTATED RINGERS IV SOLN
INTRAVENOUS | Status: DC
  Administered 2018-11-24: 10:00:00 via INTRAVENOUS

## 2018-11-24 MED ORDER — DIPHENHYDRAMINE HCL 50 MG/ML IJ SOLN
INTRAMUSCULAR | Status: DC | PRN
  Administered 2018-11-24: 25 mg via INTRAVENOUS

## 2018-11-24 MED ORDER — SUGAMMADEX SODIUM 500 MG/5ML IV SOLN
INTRAVENOUS | Status: AC
  Filled 2018-11-24: qty 5

## 2018-11-24 MED ORDER — LACTATED RINGERS IR SOLN
Status: DC | PRN
  Administered 2018-11-24: 2000 mL

## 2018-11-24 MED ORDER — LIDOCAINE HCL (PF) 1 % IJ SOLN
INTRAMUSCULAR | Status: AC
  Filled 2018-11-24: qty 30

## 2018-11-24 MED ORDER — OXYCODONE HCL 5 MG PO TABS: 5.0000 mg | ORAL_TABLET | Freq: Once | ORAL | Status: DC | PRN

## 2018-11-24 MED ORDER — METHOCARBAMOL 500 MG PO TABS
500.0000 mg | ORAL_TABLET | Freq: Four times a day (QID) | ORAL | Status: DC | PRN
  Administered 2018-11-26: 500 mg via ORAL
  Filled 2018-11-24: qty 1

## 2018-11-24 MED ORDER — ONDANSETRON HCL 4 MG/2ML IJ SOLN
INTRAMUSCULAR | Status: AC
  Filled 2018-11-24: qty 2

## 2018-11-24 MED ORDER — ENOXAPARIN SODIUM 40 MG/0.4ML ~~LOC~~ SOLN
40.0000 mg | SUBCUTANEOUS | Status: DC
  Administered 2018-11-25 – 2018-11-27 (×3): 40 mg via SUBCUTANEOUS
  Filled 2018-11-24 (×3): qty 0.4

## 2018-11-24 MED ORDER — PHENYLEPHRINE HCL 10 MG/ML IJ SOLN
INTRAMUSCULAR | Status: AC
  Filled 2018-11-24: qty 1

## 2018-11-24 SURGICAL SUPPLY — 49 items
ADH SKN CLS APL DERMABOND .7 (GAUZE/BANDAGES/DRESSINGS) ×1
APPLIER CLIP ROT 10 11.4 M/L (STAPLE) ×2
APR CLP MED LRG 11.4X10 (STAPLE) ×1
BAG SPEC RTRVL LRG 6X4 10 (ENDOMECHANICALS)
CABLE HIGH FREQUENCY MONO STRZ (ELECTRODE) ×1 IMPLANT
CHLORAPREP W/TINT 26ML (MISCELLANEOUS) ×3 IMPLANT
CLIP APPLIE ROT 10 11.4 M/L (STAPLE) ×1 IMPLANT
CLIP VESOLOCK MED LG 6/CT (CLIP) IMPLANT
COVER MAYO STAND STRL (DRAPES) ×1 IMPLANT
COVER SURGICAL LIGHT HANDLE (MISCELLANEOUS) ×2 IMPLANT
COVER WAND RF STERILE (DRAPES) ×1 IMPLANT
DECANTER SPIKE VIAL GLASS SM (MISCELLANEOUS) ×1 IMPLANT
DERMABOND ADVANCED (GAUZE/BANDAGES/DRESSINGS) ×1
DERMABOND ADVANCED .7 DNX12 (GAUZE/BANDAGES/DRESSINGS) IMPLANT
DRAIN CHANNEL 19F RND (DRAIN) ×1 IMPLANT
DRAPE C-ARM 42X120 X-RAY (DRAPES) ×1 IMPLANT
ELECT L-HOOK LAP 45CM DISP (ELECTROSURGICAL)
ELECT PENCIL ROCKER SW 15FT (MISCELLANEOUS) ×2 IMPLANT
ELECT REM PT RETURN 15FT ADLT (MISCELLANEOUS) ×2 IMPLANT
ELECTRODE L-HOOK LAP 45CM DISP (ELECTROSURGICAL) IMPLANT
ENDOLOOP SUT PDS II  0 18 (SUTURE) ×3
ENDOLOOP SUT PDS II 0 18 (SUTURE) IMPLANT
EVACUATOR SILICONE 100CC (DRAIN) ×1 IMPLANT
GLOVE BIO SURGEON STRL SZ 6 (GLOVE) ×2 IMPLANT
GLOVE INDICATOR 6.5 STRL GRN (GLOVE) ×2 IMPLANT
GOWN STRL REUS W/TWL 2XL LVL3 (GOWN DISPOSABLE) ×2 IMPLANT
GOWN STRL REUS W/TWL XL LVL3 (GOWN DISPOSABLE) ×4 IMPLANT
HEMOSTAT SNOW SURGICEL 2X4 (HEMOSTASIS) IMPLANT
KIT BASIN OR (CUSTOM PROCEDURE TRAY) ×2 IMPLANT
L-HOOK LAP DISP 36CM (ELECTROSURGICAL) ×2
LHOOK LAP DISP 36CM (ELECTROSURGICAL) IMPLANT
POUCH SPECIMEN RETRIEVAL 10MM (ENDOMECHANICALS) ×1 IMPLANT
PROTECTOR NERVE ULNAR (MISCELLANEOUS) IMPLANT
SCISSORS LAP 5X35 DISP (ENDOMECHANICALS) ×2 IMPLANT
SET CHOLANGIOGRAPH MIX (MISCELLANEOUS) ×1 IMPLANT
SET IRRIG TUBING LAPAROSCOPIC (IRRIGATION / IRRIGATOR) ×2 IMPLANT
SET TUBE SMOKE EVAC HIGH FLOW (TUBING) ×1 IMPLANT
SLEEVE XCEL OPT CAN 5 100 (ENDOMECHANICALS) ×2 IMPLANT
SURGICEL SNOW 2X4 (HEMOSTASIS) ×1 IMPLANT
SUT ETHILON 2 0 PS N (SUTURE) ×1 IMPLANT
SUT MNCRL AB 4-0 PS2 18 (SUTURE) ×2 IMPLANT
SUT VICRYL 0 UR6 27IN ABS (SUTURE) ×1 IMPLANT
TAPE CLOTH 4X10 WHT NS (GAUZE/BANDAGES/DRESSINGS) IMPLANT
TOWEL OR 17X26 10 PK STRL BLUE (TOWEL DISPOSABLE) ×2 IMPLANT
TOWEL OR NON WOVEN STRL DISP B (DISPOSABLE) ×2 IMPLANT
TRAY LAPAROSCOPIC (CUSTOM PROCEDURE TRAY) ×2 IMPLANT
TROCAR BLADELESS OPT 5 100 (ENDOMECHANICALS) ×2 IMPLANT
TROCAR XCEL BLUNT TIP 100MML (ENDOMECHANICALS) ×2 IMPLANT
TROCAR XCEL NON-BLD 11X100MML (ENDOMECHANICALS) ×2 IMPLANT

## 2018-11-24 NOTE — Anesthesia Preprocedure Evaluation (Addendum)
Anesthesia Evaluation  Patient identified by MRN, date of birth, ID band Patient awake    Reviewed: Allergy & Precautions, NPO status , Patient's Chart, lab work & pertinent test results  History of Anesthesia Complications Negative for: history of anesthetic complications  Airway Mallampati: II  TM Distance: >3 FB Neck ROM: Full    Dental  (+) Dental Advisory Given, Teeth Intact   Pulmonary asthma , sleep apnea and Continuous Positive Airway Pressure Ventilation , COPD, former smoker,    breath sounds clear to auscultation       Cardiovascular hypertension, Pt. on home beta blockers and Pt. on medications (-) angina+ CAD and + CABG   Rhythm:Regular Rate:Normal  S/p aortic valve sparing aortic root replacement   '19 Carotid US - 1-39% b/l ICAS  '19 TTE - Moderate LVH. EF 55% to 60%. Grade 1 diastolic dysfunction. Mild AI. The aortic root was mildly dilated. LA was moderately to severely dilated.    Neuro/Psych PSYCHIATRIC DISORDERS Anxiety Depression CVA (balance issues and mild right sided weakness), Residual Symptoms    GI/Hepatic GERD  Controlled,(+)     substance abuse (denies drug use in past 30 years)  alcohol use, cocaine use and marijuana use,   Endo/Other  diabetes, Type 2, Oral Hypoglycemic Agents Obesity Hypokalemia Hypocalcemia   Renal/GU negative Renal ROS     Musculoskeletal  (+) Arthritis , Osteoarthritis,   Chronic back and hand pain    Abdominal (+) + obese,   Peds  Hematology  (+) anemia ,   Anesthesia Other Findings   Reproductive/Obstetrics                           Anesthesia Physical Anesthesia Plan  ASA: III  Anesthesia Plan: General   Post-op Pain Management:    Induction: Intravenous  PONV Risk Score and Plan: 4 or greater and Treatment may vary due to age or medical condition, Ondansetron, Dexamethasone and Midazolam  Airway Management Planned:  Oral ETT  Additional Equipment: None  Intra-op Plan:   Post-operative Plan: Extubation in OR  Informed Consent: I have reviewed the patients History and Physical, chart, labs and discussed the procedure including the risks, benefits and alternatives for the proposed anesthesia with the patient or authorized representative who has indicated his/her understanding and acceptance.     Dental advisory given  Plan Discussed with: CRNA and Anesthesiologist  Anesthesia Plan Comments:        Anesthesia Quick Evaluation

## 2018-11-24 NOTE — Progress Notes (Signed)
Pt has declined use of CPAP QHS, machine remains ready at bedside incase pt changes his mind.  RT to monitor and assess as needed.

## 2018-11-24 NOTE — Progress Notes (Signed)
Dr. Fransisco Beau notified that pt temp was 100.4, states they will give tylenol to pt after surgery in PACU.

## 2018-11-24 NOTE — Discharge Instructions (Signed)
[facility] ______[facility] °LAPAROSCOPIC SURGERY: POST OP INSTRUCTIONS °Always review your discharge instruction sheet given to you by the facility where your surgery was performed. °IF YOU HAVE DISABILITY OR FAMILY LEAVE FORMS, YOU MUST BRING THEM TO THE OFFICE FOR PROCESSING.   °DO NOT GIVE THEM TO YOUR DOCTOR. ° °1. A prescription for pain medication may be given to you upon discharge.  Take your pain medication as prescribed, if needed.  If narcotic pain medicine is not needed, then you may take acetaminophen (Tylenol) or ibuprofen (Advil) as needed. °2. Take your usually prescribed medications unless otherwise directed. °3. If you need a refill on your pain medication, please contact your pharmacy.  They will contact our office to request authorization. Prescriptions will not be filled after 5pm or on week-ends. °4. You should follow a light diet the first few days after arrival home, such as soup and crackers, etc.  Be sure to include lots of fluids daily. °5. Most patients will experience some swelling and bruising in the area of the incisions.  Ice packs will help.  Swelling and bruising can take several days to resolve.  °6. It is common to experience some constipation if taking pain medication after surgery.  Increasing fluid intake and taking a stool softener (such as Colace) will usually help or prevent this problem from occurring.  A mild laxative (Milk of Magnesia or Miralax) should be taken according to package instructions if there are no bowel movements after 48 hours. °7. Unless discharge instructions indicate otherwise, you may remove your bandages 24-48 hours after surgery, and you may shower at that time.  You may have steri-strips (small skin tapes) in place directly over the incision.  These strips should be left on the skin for 7-10 days.  If your surgeon used skin glue on the incision, you may shower in 24 hours.  The glue will flake off over the next 2-3 weeks.  Any sutures or  staples will be removed at the office during your follow-up visit. °8. ACTIVITIES:  You may resume regular (light) daily activities beginning the next day--such as daily self-care, walking, climbing stairs--gradually increasing activities as tolerated.  You may have sexual intercourse when it is comfortable.  Refrain from any heavy lifting or straining until approved by your doctor. °a. You may drive when you are no longer taking prescription pain medication, you can comfortably wear a seatbelt, and you can safely maneuver your car and apply brakes. °b. RETURN TO WORK:  __________________________________________________________ °9. You should see your doctor in the office for a follow-up appointment approximately 2-3 weeks after your surgery.  Make sure that you call for this appointment within a day or two after you arrive home to insure a convenient appointment time. °10. OTHER INSTRUCTIONS: __________________________________________________________________________________________________________________________ __________________________________________________________________________________________________________________________ °WHEN TO CALL YOUR DOCTOR: °1. Fever over 101.0 °2. Inability to urinate °3. Continued bleeding from incision. °4. Increased pain, redness, or drainage from the incision. °5. Increasing abdominal pain ° °The clinic staff is available to answer your questions during regular business hours.  Please don’t hesitate to call and ask to speak to one of the nurses for clinical concerns.  If you have a medical emergency, go to the nearest emergency room or call 911.  A surgeon from [facility] is always on call at the hospital. °[address], [address], [place], [place]  [address] ? [address] [address], [place],    [address] °[phone] ? [phone] ? FAX [phone] °Web site:   [email or web address]  d  [facility] Surgical drains are used to  remove extra fluid that normally builds up in a surgical wound after surgery. A surgical drain helps to heal a surgical wound. Different kinds of surgical drains include:  Active drains. These drains use suction to pull drainage away from the surgical wound. Drainage flows through a tube to a container outside of the body. It is important to keep the bulb or the drainage container flat (compressed) at all times, except while you empty it. Flattening the bulb or container creates suction. The two most common types of active drains are bulb drains and Hemovac drains.  Passive drains. These drains allow fluid to drain naturally, by gravity. Drainage flows through a tube to a bandage (dressing) or a container outside of the body. Passive drains do not need to be emptied. The most common type of passive drain is the Penrose drain. A drain is placed during surgery. Immediately after surgery, drainage is usually bright red and a little thicker than water. The drainage may gradually turn yellow or pink and become thinner. It is likely that your health care provider will remove the drain when the drainage stops or when the amount decreases to 1-2 Tbsp (15-30 mL) during a 24-hour period. How to care for your surgical drain It is important to care for your drain to prevent infection. If your drain is placed at your back, or any other hard-to-reach area, ask another person to assist you in performing the following steps:  Keep the skin around the drain dry and covered with a dressing at all times.  Check your drain area every day for signs of infection. Check for: ? More redness, swelling, or pain. ? Pus or a bad smell. ? Cloudy drainage. Follow instructions from your health care provider about how to take care of your drain and how to change your dressing. Change your dressing at least one time every day. Change it more often if needed to keep the dressing dry. Make sure you: 1. Gather your supplies,  including: ? Tape. ? Germ-free cleaning solution (sterile saline). ? Split gauze drain sponge: 4 x 4 inches (10 x 10 cm). ? Gauze square: 4 x 4 inches (10 x 10 cm). 2. Wash your hands with soap and water before you change your dressing. If soap and water are not available, use hand sanitizer. 3. Remove the old dressing. Avoid using scissors to do that. 4. Use sterile saline to clean your skin around the drain. 5. Place the tube through the slit in a drain sponge. Place the drain sponge so that it covers your wound. 6. Place the gauze square or another drain sponge on top of the drain sponge that is on the wound. Make sure the tube is between those layers. 7. Tape the dressing to your skin. 8. If you have an active bulb or Hemovac drain, tape the drainage tube to your skin 1-2 inches (2.5-5 cm) below the place where the tube enters your body. Taping keeps the tube from pulling on any stitches (sutures) that you have. 9. Wash your hands with soap and water. 10. Write down the color of your drainage and how often you change your dressing. How to empty your active bulb or Hemovac drain  1. Make sure that you have a measuring cup that you can empty your drainage into. 2. Wash your hands with soap and water. If soap and water are not available, use hand sanitizer. 3. Gently  move your fingers down the tube while squeezing very lightly. This is called stripping the tube. This clears any drainage, clots, or tissue from the tube. ? Do not pull on the tube. ? You may need to strip the tube several times every day to keep the tube clear. 4. Open the bulb cap or the drain plug. Do not touch the inside of the cap or the bottom of the plug. 5. Empty all of the drainage into the measuring cup. 6. Compress the bulb or the container and replace the cap or the plug. To compress the bulb or the container, squeeze it firmly in the middle while you close the cap or plug the container. 7. Write down the amount of  drainage that you have in each 24-hour period. If you have less than 2 Tbsp (30 mL) of drainage during 24 hours, contact your health care provider. 8. Flush the drainage down the toilet. 9. Wash your hands with soap and water. Contact a health care provider if:  You have more redness, swelling, or pain around your drain area.  The amount of drainage that you have is increasing instead of decreasing.  You have pus or a bad smell coming from your drain area.  You have a fever.  You have drainage that is cloudy.  There is a sudden stop or a sudden decrease in the amount of drainage that you have.  Your tube falls out.  Your active draindoes not stay compressedafter you empty it. Summary  Surgical drains are used to remove extra fluid that normally builds up in a surgical wound after surgery.  Different kinds of surgical drains include active drains and passive drains. Active drains use suction to pull drainage away from the surgical wound, and passive drains allow fluid to drain naturally.  It is important to care for your drain to prevent infection. If your drain is placed at your back, or any other hard-to-reach area, ask another person to assist you.  Contact your health care provider if you have more redness, swelling, or pain around your drain area. This information is not intended to replace advice given to you by your health care provider. Make sure you discuss any questions you have with your health care provider. Document Released: 10/10/2000 Document Revised: 11/05/2017 Document Reviewed: 05/02/2015 Elsevier Interactive Patient Education  2019 [employer].

## 2018-11-24 NOTE — Progress Notes (Signed)
[facility] Progress Note  Day of Surgery  Subjective: CC-  Patient states that he feels about the same. Continues to have RUQ pain. Also states that he's having hunger pains. Denies n/v. Bilirubin trending down 1.3 from 1.7.  Objective: Vital signs in last 24 hours: Temp:  [98 F (36.7 C)-100.9 F (38.3 C)] 100.9 F (38.3 C) (01/29 0926) Pulse Rate:  [70-87] 82 (01/29 0926) Resp:  [17-22] 17 (01/29 0926) BP: (101-153)/(46-77) 149/74 (01/29 0926) SpO2:  [89 %-97 %] 93 % (01/29 0926) Weight:  [141.5 kg] 141.5 kg (01/29 0932) Last BM Date: 11/18/18  Intake/Output from previous day: 01/28 0701 - 01/29 0700 In: 2092 [P.O.:480; I.V.:1500; IV Piggyback:112] Out: -  Intake/Output this shift: No intake/output data recorded.  PE: Gen:  Alert, NAD, pleasant HEENT: EOM's intact, pupils equal and round Card:  RRR Pulm:  CTAB, no W/R/R, effort normal Abd: Soft, protuberant, +BS, no HSM, TTP RUQ Psych: A&Ox3  Skin: warm and dry  Lab Results:  Recent Labs    11/23/18 1437 11/24/18 0650  WBC 22.5* 21.0*  HGB 12.8* 11.6*  HCT 38.9* 35.7*  PLT 296 253   BMET Recent Labs    11/23/18 1437 11/24/18 0650  NA 138 138  K 3.0* 3.0*  CL 100 104  CO2 26 25  GLUCOSE 153* 150*  BUN 22* 18  CREATININE 1.02 0.89  CALCIUM 8.8* 8.3*   PT/INR No results for input(s): LABPROT, INR in the last 72 hours. CMP     Component Value Date/Time   NA 138 11/24/2018 0650   NA 145 (H) 03/17/2018 1830   K 3.0 (L) 11/24/2018 0650   CL 104 11/24/2018 0650   CO2 25 11/24/2018 0650   GLUCOSE 150 (H) 11/24/2018 0650   BUN 18 11/24/2018 0650   BUN 12 03/17/2018 1830   CREATININE 0.89 11/24/2018 0650   CREATININE 0.89 02/16/2016 1004   CALCIUM 8.3 (L) 11/24/2018 0650   PROT 6.4 (L) 11/24/2018 0650   PROT 6.6 03/17/2018 1830   ALBUMIN 2.8 (L) 11/24/2018 0650   ALBUMIN 4.2 03/17/2018 1830   AST 43 (H) 11/24/2018 0650   ALT 57 (H) 11/24/2018 0650   ALKPHOS 112 11/24/2018 0650    BILITOT 1.3 (H) 11/24/2018 0650   BILITOT 0.4 03/17/2018 1830   GFRNONAA >60 11/24/2018 0650   GFRNONAA >89 06/09/2013 1416   GFRAA >60 11/24/2018 0650   GFRAA >89 06/09/2013 1416   Lipase     Component Value Date/Time   LIPASE 38 11/23/2018 1437       Studies/Results: No results found.  Anti-infectives: Anti-infectives (From admission, onward)   Start     Dose/Rate Route Frequency Ordered Stop   11/23/18 1500  [MAR Hold]  piperacillin-tazobactam (ZOSYN) IVPB 3.375 g     (MAR Hold since Wed 11/24/2018 at 0921. 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 Keep NPO and continue IV abx for now. If cleared will plan for laparoscopic cholecystectomy with possible IOC today.  ID - zosyn 1/28>> FEN -  IVF, NPO VTE - SCDs, lovenox Foley - none Follow up - TBD   LOS: 1 day    Wellington Hampshire , [facility] 11/24/2018, 10:28 AM Pager: [phone] Mon-Thurs 7:00 am-4:30 pm Fri 7:00 am -11:30 AM Sat-Sun 7:00 am-11:30 am

## 2018-11-24 NOTE — Op Note (Signed)
Laparoscopic Cholecystectomy   Indications: This patient presents with acute calculous cholecystitis and will undergo laparoscopic cholecystectomy.  Pre-operative Diagnosis: acute calculous cholecystitis  Post-operative Diagnosis: Same  Surgeon: Stark Klein   Assistants: n/a  Anesthesia: General endotracheal anesthesia and local  ASA Class: 3  Procedure Details  The patient was seen again in the Holding Room. The risks, benefits, complications, treatment options, and expected outcomes were discussed with the patient. The possibilities of  bleeding, recurrent infection, damage to nearby structures, the need for additional procedures, failure to diagnose a condition, the possible need to convert to an open procedure, and creating a complication requiring transfusion or operation were discussed with the patient. The likelihood of improving the patient's symptoms with return to their baseline status is good.    The patient and/or family concurred with the proposed plan, giving informed consent. The site of surgery properly noted. The patient was taken to Operating Room, and the procedure verified as Laparoscopic Cholecystectomy with possible Intraoperative Cholangiogram. A Time Out was held and the above information confirmed.  Prior to the induction of general anesthesia, antibiotic prophylaxis was administered. General endotracheal anesthesia was then administered and tolerated well. After the induction, the abdomen was prepped with Chloraprep and draped in the sterile fashion. The patient was positioned in the supine position.  Local anesthetic agent was injected into the skin near the umbilicus and an incision made. We dissected down to the abdominal fascia with blunt dissection.  The fascia was incised vertically and we entered the peritoneal cavity bluntly.  A pursestring suture of 0-Vicryl was placed around the fascial opening.  The Hasson cannula was inserted and secured with the stay  suture.  Pneumoperitoneum was then created with CO2 and tolerated well without any adverse changes in the patient's vital signs. An 11-mm port was placed in the subxiphoid position.  Two 5-mm ports were placed in the right upper quadrant. All skin incisions were infiltrated with a local anesthetic agent before making the incision and placing the trocars.   We positioned the patient in reverse Trendelenburg, tilted slightly to the patient's left. The gallbladder was not immediately visible.  The omentum was bluntly dissected away.  The gallbladder was gangrenous and portions of the wall were dying.  It was also extremely distended.  The Nezhat was used to aspirate the gallbladder.  The fundus was then elevated a bit.  The omentum was bluntly dissected away from the infundibulum.  This was gangrenous as well.  The gallbladder was dissected dome down.  The cystic artery was identified and clipped.  The infundibulum and cystic duct were too friable to clip or to do cholangiogram.  The gallbladder was truncated with the cautery.  Two endoloops were placed around the infundibulum.    The gallbladder was removed and placed in an Ecosac.  The gallbladder and bag were then removed through the umbilical port site.  The liver bed was irrigated and inspected. Hemostasis was achieved with the electrocautery. Copious irrigation was utilized and was repeatedly aspirated until clear.  A 19 Fr blake was placed in the gallbladder fossa and pulled out the lateral most trocar site.  This was secured with a 2-0 nylon.  A piece of snow was placed over the fossa as well.    We again inspected the right upper quadrant for hemostasis.  Pneumoperitoneum was released as we removed the trocars.   The pursestring suture was used to close the umbilical fascia.  4-0 Monocryl was used to close the skin.  The skin was cleaned and dry, and Dermabond was applied. The patient was then extubated and brought to the recovery room in stable  condition. Instrument, sponge, and needle counts were correct at closure and at the conclusion of the case.   Findings: Gangrenous gallbladder.  Pigmented stones.  Friable infundibulum and cystic duct.      Estimated Blood Loss: 50 mL         Drains: 19 Fr blake          Specimens: Gallbladder to pathology       Complications: None; patient tolerated the procedure well.         Disposition: PACU - hemodynamically stable.         Condition: stable

## 2018-11-24 NOTE — Anesthesia Procedure Notes (Signed)
Procedure Name: Intubation Date/Time: 11/24/2018 12:19 PM Performed by: Raenette Rover, CRNA Pre-anesthesia Checklist: Patient identified, Emergency Drugs available, Suction available and Patient being monitored Patient Re-evaluated:Patient Re-evaluated prior to induction Oxygen Delivery Method: Circle system utilized Preoxygenation: Pre-oxygenation with 100% oxygen Induction Type: IV induction Ventilation: Mask ventilation without difficulty Laryngoscope Size: Mac and 4 Grade View: Grade III Tube type: Oral Tube size: 7.5 mm Number of attempts: 1 Airway Equipment and Method: Stylet Placement Confirmation: positive ETCO2,  CO2 detector and breath sounds checked- equal and bilateral Secured at: 23 cm Tube secured with: Tape Dental Injury: Teeth and Oropharynx as per pre-operative assessment

## 2018-11-24 NOTE — Consult Note (Signed)
Cardiology Consultation:   Patient ID: Jonathan Miles MRN: [id number]; DOB: 07-20-1961  Admit date: 11/23/2018 Date of Consult: 11/24/2018  Primary Care Provider: Harrison Mons, PA Primary Cardiologist: Jonathan Bush, MD Needs new Nellie MD Primary Electrophysiologist:  None    Patient Profile:   Jonathan Miles is a 58 y.o. 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 Rush Miles.  History of Present Illness:   Jonathan Miles has a hx of aortic root aneurysm -AV sparing ascending aortic root replacement with 30 mm Hemasheild graft, CABG, VG-RCA, PRF repair 04/2012.  Other hx of HTN, HLD, DM-2, OSA with CPAP, hx asthma/tobacco use.   His last  Echo 12/17/17 with EF 55-60%, moderate LVH, no RWMA, G1DD, mild AR, aortic root was mildly dilated and LA was moderately to severely dilated.   He does have mild carotid disease as well bil, 1-39%.    Now admitted 11/23/18 with abd pain, RUQ, and N&V.  He has known gallstones diagnosed in 05/2018 but had just had appendix out and further surgery held due to no biliary dilation.  Was seen back 11/19/18 in ER with acute abd pain.  With WBC of 11 he was sent home with percocet which did help.  Though he has had nausea since.  He was seen by Dr. Rush Miles in the office and admitted.    EKG:  The EKG was personally reviewed and demonstrates:  11/19/18 SR with upward swing of ST in Inf. Leads but this is similar to EKG 12/17/17 and today's EKG stable.   Telemetry:  Telemetry was personally reviewed and demonstrates:  Not on tele.      K+ 3.0, Na 138, Cr 0.89, AST 43, ALT 57 WBC 22.5 on admit now 21, hgb 11.6  Currently still with mild abd pain.  He has not had any chest pain and is able to  walk up 2 flights of steps without pain.  He does have dyspnea with exertion, walking up 2 flights of steps he would be SOB.  No palpitations.  No syncope.   Past Medical History:  Diagnosis Date  . Alcohol abuse   . Aortic root dilatation (HCC)    a. echo 4/12: EF 55-60%, mod to marked dilated ascending aortic root;   b. MRA 4/12: Aortic root 5.5 cm, dilation of left and right coronary cusps, trileaflet aortic valve // Echo 2/19: Moderate LVH, EF 55-60, normal wall motion, grade 1 diastolic dysfunction, mild AI, mildly dilated aortic root, moderate to severe LAE (aortic root 40 mm; ascending aorta 39 mm)   . Arthritis   . Asthma    ast attack in early 20's  . CAD (coronary artery disease)   . Chest pain    Myoview 4/12: EF 62%, no ischemia or scar  . CHF (congestive heart failure) ([place])   . Chronic pain    back and hands  . Constipation   . COPD (chronic obstructive pulmonary disease) ([place])   . Depression   . Diabetes mellitus    type 2  . Difficulty speaking   . Drug use   . Enlarged prostate   . Fatigue   . GERD (gastroesophageal reflux disease)    modifies with diet  . Hx of echocardiogram  Echo (2/16):  Mod LVH, EF 55-60%, mild to mod AI, mod LAE, mild RAE, Aortic root 38 mm, Asc aorta 43 mm  . Hyperlipidemia   . Hypertension   . Joint pain   . Knee pain, bilateral   . Leg pain    ABIs 3/19: Normal (R 1.3; L 1.28)  . OSA (obstructive sleep apnea) 2006   .  Wears at times  . Osteoarthritis   . Palpitations   . Stroke [facility])     Past Surgical History:  Procedure Laterality Date  . APPENDECTOMY  12/2016  . Colonscopy    . CORONARY ARTERY BYPASS GRAFT  05/07/2012   Procedure: CORONARY ARTERY BYPASS GRAFTING (CABG);  Surgeon: Grace Isaac, MD;  Location: [facility];  Service: Open Heart Surgery;  Laterality: N/A;  . KNEE ARTHROSCOPY Left 2010   Dr. Rosamaria Lints  . LAPAROSCOPIC APPENDECTOMY N/A 01/06/2017   Procedure: APPENDECTOMY LAPAROSCOPIC;  Surgeon: Ralene Ok,  MD;  Location: [facility];  Service: General;  Laterality: N/A;  . Retactment of fingers     as a child , sewed with sewing machine- Index and middle finger  . UPPER GASTROINTESTINAL ENDOSCOPY       Home Medications:  Prior to Admission medications   Medication Sig Start Date End Date Taking? Authorizing Provider  acetaminophen (TYLENOL) 500 MG tablet TAKE 1-2 TABS EVERY 6 HOURS AS NEEDED FOR PAIN Patient taking differently: Take 500-1,000 mg by mouth 2 (two) times daily as needed for mild pain.  05/26/12  Yes Weaver, Scott T, PA-C  amitriptyline (ELAVIL) 25 MG tablet Take 1 tablet (25 mg total) by mouth at bedtime. 11/03/18  Yes Venancio Poisson, NP  aspirin 325 MG EC tablet Take 1 tablet (325 mg total) by mouth at bedtime. 02/20/18  Yes Jeffery, Chelle, PA  atorvastatin (LIPITOR) 80 MG tablet Take 1 tablet (80 mg total) by mouth daily at 6 PM. 01/21/18  Yes Jeffery, Chelle, PA  buPROPion (WELLBUTRIN XL) 150 MG 24 hr tablet Take 150 mg by mouth daily.  04/30/18  Yes [provider]  carvedilol (COREG) 12.5 MG tablet TAKE ONE TABLET BY MOUTH TWO TIMES A DAY Patient taking differently: Take 12.5 mg by mouth 2 (two) times daily.  01/04/18  Yes Jeffery, Chelle, PA  felodipine (PLENDIL) 10 MG 24 hr tablet TAKE ONE TABLET BY MOUTH DAILY Patient taking differently: Take 10 mg by mouth daily.  11/03/17  Yes Jeffery, Chelle, PA  furosemide (LASIX) 40 MG tablet TAKE ONE TABLET BY MOUTH DAILY Patient taking differently: Take 40 mg by mouth daily.  11/03/17  Yes Jeffery, Chelle, PA  lidocaine (XYLOCAINE) 5 % ointment Apply 1 application topically as needed. Daily for leg pain 01/23/18  Yes Jeffery, Chelle, PA  lisinopril (PRINIVIL,ZESTRIL) 20 MG tablet TAKE ONE TABLET BY MOUTH DAILY Patient taking differently: Take 20 mg by mouth daily.  11/03/17  Yes Jeffery, Chelle, PA  loratadine (CLARITIN) 10 MG tablet Take 10 mg by mouth at bedtime.    Yes [provider]  Menthol-Methyl Salicylate (THERA-GESIC)  0.5-15 % CREA Apply 1 application topically daily as needed (pain).   Yes [provider]  metFORMIN (GLUCOPHAGE) 1000 MG tablet Take 1 tablet (1,000 mg total) by mouth 2 (two) times daily with a meal. 01/21/18  Yes Jeffery, Chelle, PA  mirabegron ER (MYRBETRIQ) 50 MG TB24 tablet Take 50 mg by mouth at bedtime.    Yes [provider]  oxyCODONE-acetaminophen (PERCOCET/ROXICET) 5-325 MG tablet Take 2 tablets by mouth every  4 (four) hours as needed for severe pain. 11/19/18  Yes Lacretia Leigh, MD  sertraline (ZOLOFT) 100 MG tablet Take 1.5 tablets (150 mg total) by mouth daily. 03/10/18  Yes Venancio Poisson, NP  tamsulosin (FLOMAX) 0.4 MG CAPS capsule Take 2 capsules (0.8 mg total) by mouth daily after supper. 09/15/17  Yes Jeffery, Domingo Mend, PA  Trolamine Salicylate (ASPERCREME EX) Apply 1 application topically daily as needed (pain).   Yes [provider]  Blood Pressure Monitoring (BLOOD PRESSURE MONITOR/L CUFF) MISC Please check blood pressure 2x per day 05/03/18   Venancio Poisson, NP    Inpatient Medications: Scheduled Meds: . acetaminophen  1,000 mg Oral Q8H  . amitriptyline  25 mg Oral QHS  . atorvastatin  80 mg Oral q1800  . buPROPion  150 mg Oral Daily  . carvedilol  12.5 mg Oral BID  . enoxaparin (LOVENOX) injection  40 mg Subcutaneous Q24H  . felodipine  10 mg Oral Daily  . furosemide  40 mg Oral Daily  . insulin aspart  0-15 Units Subcutaneous Q4H  . lisinopril  20 mg Oral Daily  . loratadine  10 mg Oral QHS  . mirabegron ER  50 mg Oral QHS  . mupirocin ointment  1 application Nasal BID  . sertraline  150 mg Oral Daily  . tamsulosin  0.8 mg Oral QPC supper   Continuous Infusions: . 0.9 % NaCl with KCl 20 mEq / L 125 mL/hr at 11/23/18 1836  . piperacillin-tazobactam (ZOSYN)  IV 3.375 g (11/24/18 0615)   PRN Meds: diphenhydrAMINE, hydrALAZINE, HYDROmorphone (DILAUDID) injection, lidocaine, ondansetron **OR** ondansetron (ZOFRAN) IV  Allergies:     Allergies  Allergen Reactions  . Ibuprofen Other (See Comments)    TOLD NOT TO TAKE-PER CARDS  . Iodinated Diagnostic Agents Hives    Hives started 4 days after cta chest was performed, minimal relief w/ benadryl x 3 days, uncertain if reaction was actually iv contrast or other allergen,allergy tests to follow.wife will make [place] aware of results//a.[place]  . Quinolones     Social History:   Social History   Socioeconomic History  . Marital status: Married    Spouse name: Quavon Keisling  . Number of children: 0  . Years of education: 12th grade  . Highest education level: Not on file  Occupational History  . 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  Social Needs  . Financial resource strain: Not on file  . Food insecurity:    Worry: Not on file    Inability: Not on file  . Transportation needs:    Medical: Not on file    Non-medical: Not on file  Tobacco Use  . Smoking status: Former Smoker    Packs/day: 1.00    Years: 13.00    Pack years: 13.00    Types: Cigarettes    Last attempt to quit: 01/23/1988    Years since quitting: 30.8  . Smokeless tobacco: Never Used  Substance and Sexual Activity  . Alcohol use: Yes    Comment: quit 1989  . Drug use: No    Comment: previous cocaine and marijuana use, quit 1988  . Sexual activity: Not Currently    Partners: Female  Lifestyle  . Physical activity:    Days per week: Not on file    Minutes per session: Not on file  . Stress: Not on file  Relationships  . Social connections:    Talks on phone: Not on file  Gets together: Not on file    Attends religious service: Not on file    Active member of club or organization: Not on file    Attends meetings of clubs or organizations: Not on file    Relationship status: Not on file  . Intimate partner violence:    Fear of current or ex partner: Not on file    Emotionally abused: Not on file    Physically abused: Not on  file    Forced sexual activity: Not on file  Other Topics Concern  . Not on file  Social History Narrative   Married, lives in [place]. Therapist, occupational for [employer].    Pt is adopted. Unsure of his family medical history.    Family History:    Family History  Adopted: Yes  Family history unknown: Yes     ROS:  Please see the history of present illness.  General:no colds or fevers, no weight changes Skin:no rashes or ulcers HEENT:no blurred vision, no congestion CV:see HPI PUL:see HPI GI:no diarrhea constipation or melena, no indigestion, + nausea GU:no hematuria, no dysuria MS:no joint pain, no claudication Neuro:no syncope, no lightheadedness Endo:+ diabetes, no thyroid disease  All other ROS reviewed and negative.     Physical Exam/Data:   Vitals:   11/23/18 2056 11/24/18 0008 11/24/18 0432 11/24/18 0754  BP: 115/62 (!) 101/46 (!) 150/76 (!) 153/77  Pulse: 73 70 79 77  Resp: 19 (!) 22 (!) 21 20  Temp: 98.9 F (37.2 C) 98.5 F (36.9 C) 98 F (36.7 C) (!) 100.5 F (38.1 C)  TempSrc: Oral Oral Oral Oral  SpO2: 92% (!) 89% 97% 92%  Weight:      Height:        Intake/Output Summary (Last 24 hours) at 11/24/2018 0756 Last data filed at 11/24/2018 0600 Gross per 24 hour  Intake 2091.97 ml  Output -  Net 2091.97 ml   Last 3 Weights 11/23/2018 11/19/2018 11/03/2018  Weight (lbs) 311 lb 15.2 oz 310 lb 322 lb  Weight (kg) 141.5 kg 140.615 kg 146.058 kg     Body mass index is 37.97 kg/m.  General:  Well nourished, well developed, in no acute distress HEENT: normal Lymph: no adenopathy Neck: no JVD Endocrine:  No thryomegaly Vascular: No carotid bruits; pedal pulses 2+ bilaterally  Cardiac:  normal S1, S2; RRR; no murmur, gallup rub or click  Lungs:  clear to auscultation bilaterally, no wheezing, rhonchi or rales  Abd: soft, +tenderness RUQ to mid, no hepatomegaly  Ext: no edema Musculoskeletal:  No deformities, BUE and BLE strength normal and  equal Skin: warm and dry  Neuro:  Alert and oriented X 3 MAE follows commands, no focal abnormalities noted Psych:  Normal affect    Relevant CV Studies: 12/17/17 Echo Study Conclusions  - Left ventricle: The cavity size was normal. Wall thickness was   increased in a pattern of moderate LVH. Systolic function was   normal. The estimated ejection fraction was in the range of 55%   to 60%. Wall motion was normal; there were no regional wall   motion abnormalities. Doppler parameters are consistent with   abnormal left ventricular relaxation (grade 1 diastolic   dysfunction). - Aortic valve: There was mild regurgitation. Valve area (VTI):   3.48 cm^2. Valve area (Vmax): 3.49 cm^2. Valve area (Vmean): 3.31   cm^2. - Aortic root: The aortic root was mildly dilated. - Left atrium: The atrium was moderately to severely dilated.  Laboratory Data:  Chemistry Recent Labs  Lab 11/19/18 2153 11/23/18 1437 11/24/18 0650  NA 140 138 138  K 3.6 3.0* 3.0*  CL 103 100 104  CO2 25 26 25   GLUCOSE 137* 153* 150*  BUN 16 22* 18  CREATININE 0.88 1.02 0.89  CALCIUM 9.2 8.8* 8.3*  GFRNONAA >60 >60 >60  GFRAA >60 >60 >60  ANIONGAP 12 12 9     Recent Labs  Lab 11/19/18 2153 11/23/18 1437 11/24/18 0650  PROT 6.7 7.3 6.4*  ALBUMIN 4.0 3.5 2.8*  AST 12* 31 43*  ALT 22 46* 57*  ALKPHOS 67 107 112  BILITOT 0.7 1.7* 1.3*   Hematology Recent Labs  Lab 11/19/18 2153 11/23/18 1437 11/24/18 0650  WBC 11.8* 22.5* 21.0*  RBC 4.78 4.47 4.07*  HGB 13.4 12.8* 11.6*  HCT 40.4 38.9* 35.7*  MCV 84.5 87.0 87.7  MCH 28.0 28.6 28.5  MCHC 33.2 32.9 32.5  RDW 14.3 14.2 14.3  PLT 301 296 253   Cardiac EnzymesNo results for input(s): TROPONINI in the last 168 hours. No results for input(s): TROPIPOC in the last 168 hours.  BNPNo results for input(s): BNP, PROBNP in the last 168 hours.  DDimer No results for input(s): DDIMER in the last 168 hours.  Radiology/Studies:  No results  found.  Assessment and Plan:   1. Pre-op eval in pt with hx of CAD s/p CABG X 1 and Aortic root replacement no chest pain with activity some dyspnea but not severe.  Dr. Radford Jonathan to see.   2. Cholecystitis/cholelithiasis possible surgery. 3. HTN  BP  From 101/46 to 153/77 on coreg 12.5 BID, plendil 10, lasix 40, lisinopril 20 and prn hydralazine IV. 4. HLD on lipitor 80 mg  5. DM-2 on SSI 6. OSA on CPAP at home continue  7. Hypokalemia on IV fluids with KCL but will give on 10 meq bolus in 100cc.      For questions or updates, please contact [place] Please consult [email or web address] for contact info under     Signed, Cecilie Kicks, NP  11/24/2018 7:56 AM

## 2018-11-24 NOTE — Transfer of Care (Signed)
Immediate Anesthesia Transfer of Care Note  Patient: Jonathan Miles  Procedure(s) Performed: LAPAROSCOPIC CHOLECYSTECTOMY (N/A Abdomen)  Patient Location: PACU  Anesthesia Type:General  Level of Consciousness: awake, drowsy and patient cooperative  Airway & Oxygen Therapy: Patient Spontanous Breathing and Patient connected to face mask oxygen  Post-op Assessment: Report given to RN and Post -op Vital signs reviewed and stable  Post vital signs: Reviewed and stable  Last Vitals:  Vitals Value Taken Time  BP 122/62 11/24/2018  2:26 PM  Temp    Pulse 76 11/24/2018  2:31 PM  Resp    SpO2 92 % 11/24/2018  2:31 PM  Vitals shown include unvalidated device data.  Last Pain:  Vitals:   11/24/18 1204  TempSrc: Oral  PainSc:       Patients Stated Pain Goal: 4 (81/44/81 8563)  Complications: No apparent anesthesia complications

## 2018-11-24 NOTE — Progress Notes (Signed)
Pt refuses cpap

## 2018-11-24 NOTE — Progress Notes (Signed)
   11/24/18 0754  Vitals  Temp (!) 100.5 F (38.1 C)  Temp Source Oral  BP (!) 153/77  MAP (mmHg) 98  BP Location Right Arm  BP Method Automatic  Patient Position (if appropriate) Lying  Pulse Rate 77  Pulse Rate Source Monitor  Resp 20  Oxygen Therapy  SpO2 92 %  O2 Device Room Air  MEWS Score  MEWS RR 0  MEWS Pulse 0  MEWS Systolic 0  MEWS LOC 0  MEWS Temp 1  MEWS Score 1  MEWS Score Color Green  Pt Temp elevated. On scheduled tylenol 1,000mg  and IV abx.  CCS PA, [place], notified and no new orders given. Coreg given per short stay request and verification from CCS PA, [place].   1120 Short stay asked about temp elevation, PA notified again. Continue w/ scheduled tylenol and IV abx.

## 2018-11-24 NOTE — Anesthesia Postprocedure Evaluation (Signed)
Anesthesia Post Note  Patient: Jonathan Miles  Procedure(s) Performed: LAPAROSCOPIC CHOLECYSTECTOMY (N/A Abdomen)     Patient location during evaluation: PACU Anesthesia Type: General Level of consciousness: awake and alert Pain management: pain level controlled Vital Signs Assessment: post-procedure vital signs reviewed and stable Respiratory status: spontaneous breathing, nonlabored ventilation, respiratory function stable and patient connected to nasal cannula oxygen Cardiovascular status: blood pressure returned to baseline and stable Postop Assessment: no apparent nausea or vomiting Anesthetic complications: no    Last Vitals:  Vitals:   11/24/18 1515 11/24/18 1526  BP: 130/79 139/83  Pulse: 77 78  Resp: (!) 22 (!) 22  Temp: 37.3 C 37.4 C  SpO2: 93% 92%    Last Pain:  Vitals:   11/24/18 1515  TempSrc:   PainSc: 0-No pain                 Audry Pili

## 2018-11-25 ENCOUNTER — Encounter (HOSPITAL_COMMUNITY): Payer: Self-pay | Admitting: General Surgery

## 2018-11-25 LAB — GLUCOSE, CAPILLARY
Glucose-Capillary: 191 mg/dL — ABNORMAL HIGH (ref 70–99)
Glucose-Capillary: 165 mg/dL — ABNORMAL HIGH (ref 70–99)
Glucose-Capillary: 249 mg/dL — ABNORMAL HIGH (ref 70–99)
Glucose-Capillary: 148 mg/dL — ABNORMAL HIGH (ref 70–99)
Glucose-Capillary: 214 mg/dL — ABNORMAL HIGH (ref 70–99)

## 2018-11-25 LAB — CBC
HCT: 34.4 % — ABNORMAL LOW (ref 39.0–52.0)
Hemoglobin: 10.9 g/dL — ABNORMAL LOW (ref 13.0–17.0)
MCH: 27.9 pg (ref 26.0–34.0)
MCHC: 31.7 g/dL (ref 30.0–36.0)
MCV: 88.2 fL (ref 80.0–100.0)
Platelets: 294 10*3/uL (ref 150–400)
RBC: 3.9 MIL/uL — ABNORMAL LOW (ref 4.22–5.81)
RDW: 14.6 % (ref 11.5–15.5)
WBC: 19.6 10*3/uL — ABNORMAL HIGH (ref 4.0–10.5)
nRBC: 0 % (ref 0.0–0.2)

## 2018-11-25 LAB — COMPREHENSIVE METABOLIC PANEL
ALT: 66 U/L — ABNORMAL HIGH (ref 0–44)
AST: 41 U/L (ref 15–41)
Albumin: 3 g/dL — ABNORMAL LOW (ref 3.5–5.0)
Alkaline Phosphatase: 125 U/L (ref 38–126)
Anion gap: 9 (ref 5–15)
BUN: 25 mg/dL — ABNORMAL HIGH (ref 6–20)
CO2: 26 mmol/L (ref 22–32)
Calcium: 8.1 mg/dL — ABNORMAL LOW (ref 8.9–10.3)
Chloride: 101 mmol/L (ref 98–111)
Creatinine, Ser: 0.88 mg/dL (ref 0.61–1.24)
GFR calc Af Amer: >60 mL/min (ref >60)
GFR calc non Af Amer: >60 mL/min (ref >60)
Glucose, Bld: 201 mg/dL — ABNORMAL HIGH (ref 70–99)
Potassium: 3.8 mmol/L (ref 3.5–5.1)
Sodium: 136 mmol/L (ref 135–145)
Total Bilirubin: 0.9 mg/dL (ref 0.3–1.2)
Total Protein: 6.7 g/dL (ref 6.5–8.1)

## 2018-11-25 MED ORDER — HYDROMORPHONE HCL 1 MG/ML IJ SOLN: 1.0000 mg | INTRAMUSCULAR | Status: DC | PRN

## 2018-11-25 NOTE — Evaluation (Addendum)
Occupational Therapy Evaluation Patient Details Name: Jonathan Miles MRN: [id number] DOB: [birth year] Today's Date: 11/25/2018    History of Present Illness Patient is a 58 y/o male s/p laparoscopic cholecystectomy on 11/24/18. PMH significant for CABG x1, Aortic root replacement, CAD, CHF, CVA, COPD, HTN, OSA.   Clinical Impression   This 58 y/o male presents with the above. At baseline pt reports he is mod independent with ADL and functional mobility using SPC, though reports occasional difficulty with ADL completion. Pt performing functional mobility this session using SPC with overall minguard assist, intermittent use of light minA provided but pt with no overt LOB noted. Pt currently requires setup assist for seated UB ADL, minA for LB ADL. Pt pleasant and motivated to work with therapy to return to PLOF. He will benefit from continued OT services to maximize his safety and independence with ADL and mobility prior to return home with spouse assist; will continue to follow acutely.     Follow Up Recommendations  Home health OT;Supervision/Assistance - 24 hour(HH vs home with 24hr)    Equipment Recommendations  None recommended by OT           Precautions / Restrictions Precautions Precautions: Fall Precaution Comments: R jp drain Restrictions Weight Bearing Restrictions: No      Mobility Bed Mobility               General bed mobility comments: OOB in recliner upon arrival  Transfers Overall transfer level: Needs assistance Equipment used: Straight cane Transfers: Sit to/from Stand Sit to Stand: Min guard         General transfer comment: for safety and immediate standing balance; stood x2 from recliner    Balance Overall balance assessment: Mild deficits observed, not formally tested                                         ADL either performed or assessed with clinical judgement   ADL Overall ADL's : Needs  assistance/impaired Eating/Feeding: Modified independent;Sitting   Grooming: Min guard;Supervision/safety;Standing;Wash/dry hands   Upper Body Bathing: Set up;Sitting   Lower Body Bathing: Min guard;Sit to/from stand   Upper Body Dressing : Set up;Sitting   Lower Body Dressing: Minimal assistance;Sit to/from stand Lower Body Dressing Details (indicate cue type and reason): pt donning underwear during session, some assist for LEs into pantlegs; close minguard assist for standing balance Toilet Transfer: Min guard;Ambulation(cane) Toilet Transfer Details (indicate cue type and reason): simulated in transfer to/from recliner, room level mobility Toileting- Clothing Manipulation and Hygiene: Min guard;Minimal assistance;Sit to/from stand       Functional mobility during ADLs: Min guard;Minimal assistance;Cane General ADL Comments: mostly minguard assist with intermittent minA provided as pt reports slightly "loopy" due to pain meds; no overt LOB noted     Vision         Perception     Praxis      Pertinent Vitals/Pain Pain Assessment: Faces Faces Pain Scale: Hurts a little bit Pain Location: abdomen Pain Descriptors / Indicators: Aching;Guarding Pain Intervention(s): Monitored during session;Premedicated before session;Limited activity within patient's tolerance;Repositioned     Hand Dominance Right   Extremity/Trunk Assessment Upper Extremity Assessment Upper Extremity Assessment: Overall WFL for tasks assessed;Generalized weakness   Lower Extremity Assessment Lower Extremity Assessment: Defer to PT evaluation   Cervical / Trunk Assessment Cervical / Trunk Assessment: Normal   Communication Communication Communication: No difficulties  Cognition Arousal/Alertness: Awake/alert Behavior During Therapy: WFL for tasks assessed/performed Overall Cognitive Status: Within Functional Limits for tasks assessed                                     General  Comments  spouse present during session    Exercises     Shoulder Instructions      Home Living Family/patient expects to be discharged to:: Private residence Living Arrangements: Spouse/significant other Available Help at Discharge: Family;Available 24 hours/day Type of Home: House Home Access: Stairs to enter [employer] of Steps: 5 Entrance Stairs-Rails: Right;Left;Can reach both Home Layout: Two level;1/2 bath on main level;Bed/bath upstairs Alternate Level Stairs-Number of Steps: flight Alternate Level Stairs-Rails: Right Bathroom Shower/Tub: Teacher, early years/pre: Standard     Home Equipment: Environmental consultant - 2 wheels;Cane - single point;Bedside commode;Grab bars - tub/shower;Tub bench          Prior Functioning/Environment Level of Independence: Independent        Comments: use of SPC for mobility; currently retired; reports increased difficulty with some ADLs        OT Problem List: Decreased strength;Decreased range of motion;Decreased activity tolerance;Impaired balance (sitting and/or standing);Pain      OT Treatment/Interventions: Self-care/ADL training;Therapeutic exercise;Neuromuscular education;DME and/or AE instruction;Therapeutic activities;Balance training;Patient/family education    OT Goals(Current goals can be found in the care plan section) Acute Rehab OT Goals Patient Stated Goal: regain strength OT Goal Formulation: With patient Time For Goal Achievement: 12/09/18 Potential to Achieve Goals: Good  OT Frequency: Min 2X/week   Barriers to D/C:            Co-evaluation              AM-PAC OT "6 Clicks" Daily Activity     Outcome Measure Help from another person eating meals?: None Help from another person taking care of personal grooming?: None Help from another person toileting, which includes using toliet, bedpan, or urinal?: A Little Help from another person bathing (including washing, rinsing, drying)?: A  Little Help from another person to put on and taking off regular upper body clothing?: None Help from another person to put on and taking off regular lower body clothing?: A Little 6 Click Score: 21   End of Session Equipment Utilized During Treatment: Gait belt;Other (comment)(cane) Nurse Communication: Mobility status  Activity Tolerance: Patient tolerated treatment well Patient left: in chair;with call bell/phone within reach;with family/visitor present  OT Visit Diagnosis: Muscle weakness (generalized) (M62.81);Unsteadiness on feet (R26.81)                Time: 3662-9476 OT Time Calculation (min): 18 min Charges:  OT General Charges $OT Visit: 1 Visit OT Evaluation $OT Eval Moderate Complexity: [employer], OT [employer] Pager [phone] Office [place] 11/25/2018, 1:51 PM

## 2018-11-25 NOTE — Progress Notes (Signed)
Progress Note  Patient Name: Jonathan Miles Date of Encounter: 11/25/2018  Primary Cardiologist: Fransico Him, MD   Subjective   No chest pain and no SOB. Sitting up in chair, taking liquids without problems  Inpatient Medications    Scheduled Meds: . acetaminophen  1,000 mg Oral Q8H  . amitriptyline  25 mg Oral QHS  . atorvastatin  80 mg Oral q1800  . buPROPion  150 mg Oral Daily  . carvedilol  12.5 mg Oral BID  . enoxaparin (LOVENOX) injection  40 mg Subcutaneous Q24H  . felodipine  10 mg Oral Daily  . furosemide  40 mg Oral Daily  . insulin aspart  0-15 Units Subcutaneous TID WC  . lisinopril  20 mg Oral Daily  . loratadine  10 mg Oral QHS  . mirabegron ER  50 mg Oral QHS  . mupirocin ointment  1 application Nasal BID  . sertraline  150 mg Oral Daily  . tamsulosin  0.8 mg Oral QPC supper   Continuous Infusions: . 0.9 % NaCl with KCl 20 mEq / L 50 mL/hr at 11/25/18 0920  . piperacillin-tazobactam (ZOSYN)  IV 3.375 g (11/25/18 0607)   PRN Meds: diphenhydrAMINE, hydrALAZINE, HYDROmorphone (DILAUDID) injection, lidocaine, methocarbamol, ondansetron **OR** ondansetron (ZOFRAN) IV, oxyCODONE   Vital Signs    Vitals:   11/24/18 1526 11/24/18 1728 11/24/18 1939 11/25/18 0435  BP: 139/83 140/81 117/65 134/81  Pulse: 78 72 69 60  Resp: (!) 22 16 18 18   Temp: 99.4 F (37.4 C) 98.4 F (36.9 C) 98 F (36.7 C) (!) 97.5 F (36.4 C)  TempSrc:  Oral Oral Oral  SpO2: 92% 96% 91% 97%  Weight:      Height:        Intake/Output Summary (Last 24 hours) at 11/25/2018 0944 Last data filed at 11/24/2018 1403 Gross per 24 hour  Intake 1828.32 ml  Output 50 ml  Net 1778.32 ml   Last 3 Weights 11/24/2018 11/23/2018 11/19/2018  Weight (lbs) 311 lb 15.2 oz 311 lb 15.2 oz 310 lb  Weight (kg) 141.5 kg 141.5 kg 140.615 kg      Telemetry    none - Personally Reviewed  ECG    No new - Personally Reviewed  Physical Exam   GEN: No acute distress.   Neck: No JVD Cardiac:  RRR, no murmurs, rubs, or gallops.  Respiratory: Clear to auscultation bilaterally. GI: Soft, + tenderness,+ BS and JP drain  MS: No edema; No deformity. Neuro:  Nonfocal  Psych: Normal affect   Labs    Chemistry Recent Labs  Lab 11/19/18 2153 11/23/18 1437 11/24/18 0650  NA 140 138 138  K 3.6 3.0* 3.0*  CL 103 100 104  CO2 25 26 25   GLUCOSE 137* 153* 150*  BUN 16 22* 18  CREATININE 0.88 1.02 0.89  CALCIUM 9.2 8.8* 8.3*  PROT 6.7 7.3 6.4*  ALBUMIN 4.0 3.5 2.8*  AST 12* 31 43*  ALT 22 46* 57*  ALKPHOS 67 107 112  BILITOT 0.7 1.7* 1.3*  GFRNONAA >60 >60 >60  GFRAA >60 >60 >60  ANIONGAP 12 12 9      Hematology Recent Labs  Lab 11/23/18 1437 11/24/18 0650 11/25/18 0612  WBC 22.5* 21.0* 19.6*  RBC 4.47 4.07* 3.90*  HGB 12.8* 11.6* 10.9*  HCT 38.9* 35.7* 34.4*  MCV 87.0 87.7 88.2  MCH 28.6 28.5 27.9  MCHC 32.9 32.5 31.7  RDW 14.2 14.3 14.6  PLT 296 253 294    Cardiac EnzymesNo results  for input(s): TROPONINI in the last 168 hours. No results for input(s): TROPIPOC in the last 168 hours.   BNPNo results for input(s): BNP, PROBNP in the last 168 hours.   DDimer No results for input(s): DDIMER in the last 168 hours.   Radiology    No results found.  Cardiac Studies   None  Patient Profile     58 y.o. 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  Now pod #1   Assessment & Plan    Acute calculus cholecystitis s/p lap chole POD #1 - intraop: gallbladder was gangrenous and portions of the wall were dying  has JP drain,  Per surgery team  CAD with CABG X 1 and Aortic root replacement AV sparing ascending aortic root replacement with 30 mm Hemasheild graft, stable no chest pain.   HTN BP controlled continue meds  OSA continue cpap  Hypokalemia awaiting lab  results  HLD on statin continue           For questions or updates, please contact [place] Please consult [email or web address] for contact info under        Signed, Cecilie Kicks, NP  11/25/2018, 9:44 AM

## 2018-11-25 NOTE — Progress Notes (Signed)
[facility] Progress Note  1 Day Post-Op  Subjective: CC-  About to get up with PT. States that he feels much better than yesterday. Minimal abdominal pain. Denies n/v. Tolerating liquids, ordered full liquids for breakfast but has not gotten this yet. Passing flatus, no BM.   Objective: Vital signs in last 24 hours: Temp:  [97.5 F (36.4 C)-100.9 F (38.3 C)] 97.5 F (36.4 C) (01/30 0435) Pulse Rate:  [60-82] 60 (01/30 0435) Resp:  [16-26] 18 (01/30 0435) BP: (117-154)/(62-83) 134/81 (01/30 0435) SpO2:  [91 %-97 %] 97 % (01/30 0435) Weight:  [141.5 kg] 141.5 kg (01/29 0932) Last BM Date: 11/18/18  Intake/Output from previous day: 01/29 0701 - 01/30 0700 In: 1828.3 [I.V.:1677.5; IV Piggyback:150.8] Out: 50 [Blood:50] Intake/Output this shift: No intake/output data recorded.  PE: Gen:  Alert, NAD, pleasant HEENT: EOM's intact, pupils equal and round Card:  RRR Pulm:  CTAB, no W/R/R, effort normal Abd: Soft, protuberant, +BS, lap incisions cdi, JP drain with dark/bloody/possibly bile-tinged fluid in bulb Psych: A&Ox3  Skin: warm and dry  Lab Results:  Recent Labs    11/24/18 0650 11/25/18 0612  WBC 21.0* 19.6*  HGB 11.6* 10.9*  HCT 35.7* 34.4*  PLT 253 294   BMET Recent Labs    11/23/18 1437 11/24/18 0650  NA 138 138  K 3.0* 3.0*  CL 100 104  CO2 26 25  GLUCOSE 153* 150*  BUN 22* 18  CREATININE 1.02 0.89  CALCIUM 8.8* 8.3*   PT/INR No results for input(s): LABPROT, INR in the last 72 hours. CMP     Component Value Date/Time   NA 138 11/24/2018 0650   NA 145 (H) 03/17/2018 1830   K 3.0 (L) 11/24/2018 0650   CL 104 11/24/2018 0650   CO2 25 11/24/2018 0650   GLUCOSE 150 (H) 11/24/2018 0650   BUN 18 11/24/2018 0650   BUN 12 03/17/2018 1830   CREATININE 0.89 11/24/2018 0650   CREATININE 0.89 02/16/2016 1004   CALCIUM 8.3 (L) 11/24/2018 0650   PROT 6.4 (L) 11/24/2018 0650   PROT 6.6 03/17/2018 1830   ALBUMIN 2.8 (L) 11/24/2018  0650   ALBUMIN 4.2 03/17/2018 1830   AST 43 (H) 11/24/2018 0650   ALT 57 (H) 11/24/2018 0650   ALKPHOS 112 11/24/2018 0650   BILITOT 1.3 (H) 11/24/2018 0650   BILITOT 0.4 03/17/2018 1830   GFRNONAA >60 11/24/2018 0650   GFRNONAA >89 06/09/2013 1416   GFRAA >60 11/24/2018 0650   GFRAA >89 06/09/2013 1416   Lipase     Component Value Date/Time   LIPASE 38 11/23/2018 1437       Studies/Results: No results found.  Anti-infectives: Anti-infectives (From admission, onward)   Start     Dose/Rate Route Frequency Ordered Stop   11/23/18 1500  piperacillin-tazobactam (ZOSYN) IVPB 3.375 g     3.375 g 12.5 mL/hr over 240 Minutes Intravenous Every 8 hours 11/23/18 1426         Assessment/Plan Dilated aortic root/CAD - AVsparing ascending aortic root replacement with 30 mm Hemasheild graft. CAB x 1 to [place], 05/07/12 - Dr Lanelle Bal Hypertension Hyperlipidemia Diabetes Type II OSA - CPAP Severe arthritis - knees Hx asthma/tobacco use  Acute calculus cholecystitis S/p laparoscopic cholecystectomy 1/29 Dr. Barry Dienes - POD 1 - intraop: gallbladder was gangrenous and portions of the wall were dying  - monitor JP drain output, high risk for bile leak - CMP pending. Advance to heart healthy diet for  lunch. PT/OT consults pending. Continue drain and IV antibiotics.   ID - zosyn 1/28>> (needs 5-7 days postop antibiotics) FEN - IVF, HH Diet VTE - SCDs, lovenox Foley - none   LOS: 2 days    [place] , [facility] 11/25/2018, 8:41 AM Pager: [phone] Mon-Thurs 7:00 am-4:30 pm Fri 7:00 am -11:30 AM Sat-Sun 7:00 am-11:30 am

## 2018-11-25 NOTE — Evaluation (Signed)
Physical Therapy Evaluation Patient Details Name: Jonathan Miles MRN: [id number] DOB: [birth year] Today's Date: 11/25/2018   History of Present Illness  Patient is a 58 y/o male s/p laparoscopic cholecystectomy on 11/24/18. PMH significant for CABG x1, Aortic root replacement, CAD, CHF, CVA, COPD, HTN, OSA.    Clinical Impression  Patient admitted with the above listed diagnosis. Patient reports Mod I with mobility with SPC prior to admission. Patient today with primary complaints of general abdominal soreness - expected post-op. Patient requiring min guard throughout mobility for safety with use of SPC. Will continue to follow with next session to focus on stair navigation/training for safe in home mobility. Will recommend HHPT at discharge to progress strength and safe functional mobility.     Follow Up Recommendations Home health PT;Supervision - Intermittent    Equipment Recommendations  None recommended by PT    Recommendations for Other Services       Precautions / Restrictions Precautions Precautions: Fall Restrictions Weight Bearing Restrictions: No      Mobility  Bed Mobility Overal bed mobility: Modified Independent                Transfers Overall transfer level: Needs assistance Equipment used: Straight cane Transfers: Sit to/from Stand Sit to Stand: Min guard         General transfer comment: for safety and immediate standing balance  Ambulation/Gait Ambulation/Gait assistance: Min guard Gait Distance (Feet): 160 Feet Assistive device: Straight cane Gait Pattern/deviations: Step-through pattern;Decreased stride length;Trunk flexed Gait velocity: decreased   General Gait Details: some trunk flexion throughout gait for guarded posturing. Mild unsteadiness with min guard for safety  Stairs            Wheelchair Mobility    Modified Rankin (Stroke Patients Only)       Balance Overall balance assessment: Mild deficits observed, not  formally tested                                           Pertinent Vitals/Pain Pain Assessment: 0-10 Pain Score: 2  Pain Location: abdomen Pain Descriptors / Indicators: Aching;Guarding Pain Intervention(s): Limited activity within patient's tolerance;Monitored during session;Premedicated before session;Repositioned    Home Living Family/patient expects to be discharged to:: Private residence Living Arrangements: Spouse/significant other Available Help at Discharge: Family;Available 24 hours/day Type of Home: House Home Access: Stairs to enter Entrance Stairs-Rails: Right;Left;Can reach both Entrance Stairs-Number of Steps: 5 Home Layout: Two level;1/2 bath on main level;Bed/bath upstairs Home Equipment: Walker - 2 wheels;Cane - single point;Bedside commode;Grab bars - tub/shower      Prior Function Level of Independence: Independent         Comments: use of SPC for mobility; currently retired     Engineer, [employer]   Dominant Hand: Right    Extremity/Trunk Assessment   Upper Extremity Assessment Upper Extremity Assessment: Defer to OT evaluation    Lower Extremity Assessment Lower Extremity Assessment: Generalized weakness    Cervical / Trunk Assessment Cervical / Trunk Assessment: Normal  Communication   Communication: No difficulties  Cognition Arousal/Alertness: Awake/alert Behavior During Therapy: WFL for tasks assessed/performed Overall Cognitive Status: Within Functional Limits for tasks assessed                                        General  Comments      Exercises     Assessment/Plan    PT Assessment Patient needs continued PT services  PT Problem List Decreased strength;Decreased activity tolerance;Decreased balance;Decreased mobility;Decreased knowledge of use of DME;Decreased safety awareness       PT Treatment Interventions DME instruction;Gait training;Stair training;Functional mobility  training;Therapeutic activities;Therapeutic exercise;Balance training;Patient/family education    PT Goals (Current goals can be found in the Care Plan section)  Acute Rehab PT Goals Patient Stated Goal: regain strength PT Goal Formulation: With patient Time For Goal Achievement: 12/09/18 Potential to Achieve Goals: Good    Frequency Min 3X/week   Barriers to discharge        Co-evaluation               AM-PAC PT "6 Clicks" Mobility  Outcome Measure Help needed turning from your back to your side while in a flat bed without using bedrails?: A Little Help needed moving from lying on your back to sitting on the side of a flat bed without using bedrails?: A Little Help needed moving to and from a bed to a chair (including a wheelchair)?: A Little Help needed standing up from a chair using your arms (e.g., wheelchair or bedside chair)?: A Little Help needed to walk in hospital room?: A Little Help needed climbing 3-5 steps with a railing? : A Little 6 Click Score: 18    End of Session Equipment Utilized During Treatment: Gait belt Activity Tolerance: Patient tolerated treatment well Patient left: in chair;with call bell/phone within reach Nurse Communication: Mobility status PT Visit Diagnosis: Unsteadiness on feet (R26.81);Other abnormalities of gait and mobility (R26.89);Muscle weakness (generalized) (M62.81)    Time: 4503-8882 PT Time Calculation (min) (ACUTE ONLY): 27 min   Charges:   PT Evaluation $PT Eval Moderate Complexity: 1 Mod PT Treatments $Gait Training: 8-22 mins         Lanney Gins, PT, DPT Supplemental Physical Therapist 11/25/18 9:15 AM Pager: [phone] Office: [phone]

## 2018-11-25 NOTE — Progress Notes (Signed)
Pt is resting comfortable on room air and is up in the chair.  Pt refuses the used of CPAP at this time.

## 2018-11-26 LAB — CBC
HCT: 35.7 % — ABNORMAL LOW (ref 39.0–52.0)
Hemoglobin: 11.3 g/dL — ABNORMAL LOW (ref 13.0–17.0)
MCH: 27.8 pg (ref 26.0–34.0)
MCHC: 31.7 g/dL (ref 30.0–36.0)
MCV: 87.9 fL (ref 80.0–100.0)
Platelets: 325 10*3/uL (ref 150–400)
RBC: 4.06 MIL/uL — ABNORMAL LOW (ref 4.22–5.81)
RDW: 14.4 % (ref 11.5–15.5)
WBC: 13.7 10*3/uL — ABNORMAL HIGH (ref 4.0–10.5)
nRBC: 0 % (ref 0.0–0.2)

## 2018-11-26 LAB — GLUCOSE, CAPILLARY
Glucose-Capillary: 102 mg/dL — ABNORMAL HIGH (ref 70–99)
Glucose-Capillary: 129 mg/dL — ABNORMAL HIGH (ref 70–99)
Glucose-Capillary: 188 mg/dL — ABNORMAL HIGH (ref 70–99)
Glucose-Capillary: 191 mg/dL — ABNORMAL HIGH (ref 70–99)
Glucose-Capillary: 214 mg/dL — ABNORMAL HIGH (ref 70–99)
Glucose-Capillary: 217 mg/dL — ABNORMAL HIGH (ref 70–99)
Glucose-Capillary: 239 mg/dL — ABNORMAL HIGH (ref 70–99)

## 2018-11-26 MED ORDER — ACETAMINOPHEN 500 MG PO TABS: ORAL_TABLET | ORAL | 0 refills | Status: AC

## 2018-11-26 MED ORDER — AMOXICILLIN-POT CLAVULANATE 875-125 MG PO TABS: 1.0000 | ORAL_TABLET | Freq: Two times a day (BID) | ORAL | 0 refills | Status: AC

## 2018-11-26 MED ORDER — POLYETHYLENE GLYCOL 3350 17 G PO PACK
17.0000 g | PACK | Freq: Every day | ORAL | Status: DC
  Administered 2018-11-26: 17 g via ORAL
  Filled 2018-11-26 (×2): qty 1

## 2018-11-26 MED ORDER — MENTHOL 3 MG MT LOZG
1.0000 | LOZENGE | OROMUCOSAL | Status: DC | PRN
  Filled 2018-11-26: qty 9

## 2018-11-26 MED ORDER — AMOXICILLIN-POT CLAVULANATE 875-125 MG PO TABS
1.0000 | ORAL_TABLET | Freq: Two times a day (BID) | ORAL | Status: DC
  Administered 2018-11-26 – 2018-11-27 (×3): 1 via ORAL
  Filled 2018-11-26 (×3): qty 1

## 2018-11-26 MED ORDER — OXYCODONE HCL 5 MG PO TABS: 5.0000 mg | ORAL_TABLET | ORAL | 0 refills | Status: DC | PRN

## 2018-11-26 NOTE — Progress Notes (Signed)
Occupational Therapy Treatment Patient Details Name: Jonathan Miles MRN: [id number] DOB: May 06, 1961 Today's Date: 11/26/2018    History of present illness Patient is a 58 y/o male s/p laparoscopic cholecystectomy on 11/24/18. PMH significant for CABG x1, Aortic root replacement, CAD, CHF, CVA, COPD, HTN, OSA.   OT comments  Pt steady walking to and from bathroom with SPC.  Performed ADL from standing  Follow Up Recommendations  Supervision/Assistance - 24 hour    Equipment Recommendations  None recommended by OT    Recommendations for Other Services      Precautions / Restrictions Precautions Precautions: Fall Precaution Comments: R jp drain Restrictions Weight Bearing Restrictions: No       Mobility Bed Mobility Overal bed mobility: Modified Independent(HOB raised)                Transfers   Equipment used: Straight cane   Sit to Stand: Supervision         General transfer comment: pt steady today    Balance                                           ADL either performed or assessed with clinical judgement   ADL       Grooming: Oral care;Wash/dry hands;Wash/dry face;Supervision/safety;Standing   Upper Body Bathing: Supervision/ safety;Standing   Lower Body Bathing: Minimal assistance;Sit to/from stand           Toilet Transfer: Supervision/safety;Ambulation(simulated, bed)             General ADL Comments: performed ADL standing at sink.  No unsteadiness today. Cues to wash around bandages; surgical sites  Min A to don gown due to lines     Vision       Perception     Praxis      Cognition Arousal/Alertness: Awake/alert Behavior During Therapy: WFL for tasks assessed/performed Overall Cognitive Status: Within Functional Limits for tasks assessed                                          Exercises     Shoulder Instructions       General Comments      Pertinent Vitals/ Pain       Pain  Assessment: Faces Faces Pain Scale: Hurts a little bit Pain Location: abdomen Pain Descriptors / Indicators: Sore Pain Intervention(s): Limited activity within patient's tolerance;Monitored during session;Premedicated before session;Repositioned  Home Living                                          Prior Functioning/Environment              Frequency           Progress Toward Goals  OT Goals(current goals can now be found in the care plan section)     Acute Rehab OT Goals Time For Goal Achievement: 12/09/18  Plan      Co-evaluation                 AM-PAC OT "6 Clicks" Daily Activity     Outcome Measure   Help from another person eating meals?: None Help from another person taking care of  personal grooming?: A Little Help from another person toileting, which includes using toliet, bedpan, or urinal?: A Little Help from another person bathing (including washing, rinsing, drying)?: A Little Help from another person to put on and taking off regular upper body clothing?: A Little Help from another person to put on and taking off regular lower body clothing?: A Little 6 Click Score: 19    End of Session        Activity Tolerance Patient tolerated treatment well   Patient Left in bed;with call bell/phone within reach(EOB for breakfast)   Nurse Communication          Time: 3299-2426 OT Time Calculation (min): 25 min  Charges: OT General Charges $OT Visit: 1 Visit OT Treatments $Self Care/Home Management : 23-37 mins  Lesle Chris, OTR/L Acute Rehabilitation Services [phone] [facility] pager [phone] office 11/26/2018   [place] 11/26/2018, 8:47 AM

## 2018-11-26 NOTE — Progress Notes (Signed)
2 Days Post-Op    CC: Abdominal pain  Subjective: He sitting up eating.  The drain looks mostly like bloody drainage.  But it is that dark reddish-brown color she cannot tell for sure.  He is tolerating soft diet not had a bowel movement and has some issues intermittently with constipation.  His other port sites look fine.  He had a bloody dressing over the umbilical site I took it down and cleaned it up is not bleeding now I cleaned it and redressed it with a dry dressing.  He is rather anxious about going home.  He feels he can handle the drain on his own without home health nurse.  He is allergic to ibuprofen so we will send him home with Tylenol and plain oxycodone for pain relief.  Objective: Vital signs in last 24 hours: Temp:  [97.5 F (36.4 C)-98.3 F (36.8 C)] 97.5 F (36.4 C) (01/31 0453) Pulse Rate:  [63-70] 70 (01/31 0453) Resp:  [18-19] 19 (01/31 0453) BP: (119-153)/(75-91) 153/91 (01/31 0453) SpO2:  [94 %-96 %] 94 % (01/31 0453) Last BM Date: 11/18/18 480 PO 600 IV 550 urine Drains - 40 NO  BM recorded Afebrile vital signs are stable BP slightly elevated. WBC down to 13.7. 19.6>> 13.7  Intake/Output from previous day: 01/30 0701 - 01/31 0700 In: 1099.2 [P.O.:480; I.V.:400; IV Piggyback:219.2] Out: 590 [Urine:550; Drains:40] Intake/Output this shift: No intake/output data recorded.  General appearance: alert, cooperative and no distress Resp: clear to auscultation bilaterally GI: Soft, positive bowel sounds still sore, umbilical port site was bleeding this is been cleaned appearing controlled is not bleeding anymore.  Other port sites look fine.  Drainage in the JP is kind of old bloody appearing drainage.  Brown-red color cannot really tell for sure there is no bile in it.  Lab Results:  Recent Labs    11/25/18 0612 11/26/18 0558  WBC 19.6* 13.7*  HGB 10.9* 11.3*  HCT 34.4* 35.7*  PLT 294 325    BMET Recent Labs    11/24/18 0650 11/25/18 0612  NA  138 136  K 3.0* 3.8  CL 104 101  CO2 25 26  GLUCOSE 150* 201*  BUN 18 25*  CREATININE 0.89 0.88  CALCIUM 8.3* 8.1*   PT/INR No results for input(s): LABPROT, INR in the last 72 hours.  Recent Labs  Lab 11/19/18 2153 11/23/18 1437 11/24/18 0650 11/25/18 0612  AST 12* 31 43* 41  ALT 22 46* 57* 66*  ALKPHOS 67 107 112 125  BILITOT 0.7 1.7* 1.3* 0.9  PROT 6.7 7.3 6.4* 6.7  ALBUMIN 4.0 3.5 2.8* 3.0*     Lipase     Component Value Date/Time   LIPASE 38 11/23/2018 1437     Medications: . acetaminophen  1,000 mg Oral Q8H  . amitriptyline  25 mg Oral QHS  . atorvastatin  80 mg Oral q1800  . buPROPion  150 mg Oral Daily  . carvedilol  12.5 mg Oral BID  . enoxaparin (LOVENOX) injection  40 mg Subcutaneous Q24H  . felodipine  10 mg Oral Daily  . furosemide  40 mg Oral Daily  . insulin aspart  0-15 Units Subcutaneous TID WC  . lisinopril  20 mg Oral Daily  . loratadine  10 mg Oral QHS  . mirabegron ER  50 mg Oral QHS  . mupirocin ointment  1 application Nasal BID  . sertraline  150 mg Oral Daily  . tamsulosin  0.8 mg Oral QPC supper  Assessment/Plan Dilated aortic root/CAD - AVsparing ascending aortic root replacement with 30 mm Hemasheild graft. CAB x 1 to [place], 05/07/12 - Dr Lanelle Bal Hypertension Hyperlipidemia Diabetes Type II OSA - CPAP Severe arthritis - knees Hx asthma/tobacco use  Acute calculus cholecystitis S/p laparoscopic cholecystectomy 1/29 Dr. Barry Dienes - POD 2 - intraop: gallbladder was gangrenous and portions of the wall were dying  - monitor JP drain output, high risk for bile leak - CMP pending. Advance to heart healthy diet for lunch. PT/OT consults pending. Continue drain and IV antibiotics.   ID -zosyn 1/28>> (needs 5-7 days postop antibiotics) FEN -IVF, HH Diet VTE -SCDs, lovenox Foley -none   Plan: We will treat with some MiraLAX this morning.  Continue to mobilize.  Switch him to oral  antibiotics for an additional 5 days.  Saline lock IV.  If he is doing well home later this afternoon after lunch.  He has follow-up for date for drain evaluation on 11/30/18 and office appointment on 211/20.  LOS: 3 days    Demetrica Zipp 11/26/2018 [phone]

## 2018-11-26 NOTE — Progress Notes (Signed)
Physical Therapy Treatment Patient Details Name: Jonathan Miles MRN: [id number] DOB: 24-Sep-1961 Today's Date: 11/26/2018    History of Present Illness Patient is a 58 y/o male s/p laparoscopic cholecystectomy on 11/24/18. PMH significant for CABG x1, Aortic root replacement, CAD, CHF, CVA, COPD, HTN, OSA.    PT Comments    Patient seen for mobility progression. Patient with improved tolerance to hallway ambulation and stair navigation. Does continue to ambulate with a guarded posture with SPC. Patient and PT still in agreement with HHPT at discharge. Will continue to follow.     Follow Up Recommendations  Home health PT;Supervision - Intermittent     Equipment Recommendations  None recommended by PT    Recommendations for Other Services       Precautions / Restrictions Precautions Precautions: Fall Precaution Comments: R jp drain Restrictions Weight Bearing Restrictions: No    Mobility  Bed Mobility Overal bed mobility: Modified Independent(HOB raised)                Transfers Overall transfer level: Needs assistance Equipment used: Straight cane Transfers: Sit to/from Stand;Stand Pivot Transfers Sit to Stand: Supervision Stand pivot transfers: Supervision       General transfer comment: increased steadiness today; reports mild pain upon initial standing  Ambulation/Gait Ambulation/Gait assistance: Min guard Gait Distance (Feet): 150 Feet Assistive device: Straight cane Gait Pattern/deviations: Step-through pattern;Decreased stride length;Trunk flexed Gait velocity: decreased   General Gait Details: guarded posture due to abdominal pain; improved steadiness   Stairs Stairs: Yes Stairs assistance: Min guard Stair Management: One rail Right;Alternating pattern;With cane Number of Stairs: 10 General stair comments: reciprocal pattern without LOB   Wheelchair Mobility    Modified Rankin (Stroke Patients Only)       Balance Overall balance  assessment: Mild deficits observed, not formally tested                                          Cognition Arousal/Alertness: Awake/alert Behavior During Therapy: WFL for tasks assessed/performed Overall Cognitive Status: Within Functional Limits for tasks assessed                                        Exercises      General Comments General comments (skin integrity, edema, etc.): spouse present and supportive      Pertinent Vitals/Pain Pain Assessment: Faces Faces Pain Scale: Hurts a little bit Pain Location: abdomen Pain Descriptors / Indicators: Sore Pain Intervention(s): Limited activity within patient's tolerance;Monitored during session;Repositioned    Home Living                      Prior Function            PT Goals (current goals can now be found in the care plan section) Acute Rehab PT Goals Patient Stated Goal: regain strength PT Goal Formulation: With patient Time For Goal Achievement: 12/09/18 Potential to Achieve Goals: Good Progress towards PT goals: Progressing toward goals    Frequency    Min 3X/week      PT Plan Current plan remains appropriate    Co-evaluation              AM-PAC PT "6 Clicks" Mobility   Outcome Measure  Help needed turning from your back to your  side while in a flat bed without using bedrails?: A Little Help needed moving from lying on your back to sitting on the side of a flat bed without using bedrails?: A Little Help needed moving to and from a bed to a chair (including a wheelchair)?: A Little Help needed standing up from a chair using your arms (e.g., wheelchair or bedside chair)?: A Little Help needed to walk in hospital room?: A Little Help needed climbing 3-5 steps with a railing? : A Little 6 Click Score: 18    End of Session Equipment Utilized During Treatment: Gait belt Activity Tolerance: Patient tolerated treatment well Patient left: in bed;with call  bell/phone within reach;with family/visitor present Nurse Communication: Mobility status PT Visit Diagnosis: Unsteadiness on feet (R26.81);Other abnormalities of gait and mobility (R26.89);Muscle weakness (generalized) (M62.81)     Time: 5697-9480 PT Time Calculation (min) (ACUTE ONLY): 18 min  Charges:  $Gait Training: 8-22 mins                      Lanney Gins, PT, DPT Supplemental Physical Therapist 11/26/18 10:48 AM Pager: [phone] Office: [phone]

## 2018-11-27 LAB — GLUCOSE, CAPILLARY
Glucose-Capillary: 126 mg/dL — ABNORMAL HIGH (ref 70–99)
Glucose-Capillary: 142 mg/dL — ABNORMAL HIGH (ref 70–99)

## 2018-11-27 NOTE — Progress Notes (Signed)
Pt leaving this afternoon with his spouse. Alert, oriented, and without c/o. Discharge instructions/prescriptions given/explained with pt verbalizing understanding.  JP drained removed and side dressed. Abdominal/umbilical site dressed. Dressing supplies sent home with pt.  Pt leaving with clothes, glasses, cell phone, cane.

## 2018-11-27 NOTE — Progress Notes (Signed)
3 Days Post-Op   Subjective/Chief Complaint: No complaints. Wants to go home without drain   Objective: Vital signs in last 24 hours: Temp:  [98.3 F (36.8 C)-98.7 F (37.1 C)] 98.7 F (37.1 C) (02/01 0513) Pulse Rate:  [65-71] 71 (02/01 0513) Resp:  [16] 16 (02/01 0513) BP: (134-155)/(79-86) 155/86 (02/01 0513) SpO2:  [92 %-96 %] 92 % (02/01 0513) Last BM Date: 11/26/18  Intake/Output from previous day: 01/31 0701 - 02/01 0700 In: 720 [P.O.:720] Out: -  Intake/Output this shift: Total I/O In: -  Out: 30 [Drains:30]  General appearance: alert and cooperative Resp: clear to auscultation bilaterally Cardio: regular rate and rhythm GI: soft, mild tenderness. drain output serosanguinous. small amount of leaking around drain  Lab Results:  Recent Labs    11/25/18 0612 11/26/18 0558  WBC 19.6* 13.7*  HGB 10.9* 11.3*  HCT 34.4* 35.7*  PLT 294 325   BMET Recent Labs    11/25/18 0612  NA 136  K 3.8  CL 101  CO2 26  GLUCOSE 201*  BUN 25*  CREATININE 0.88  CALCIUM 8.1*   PT/INR No results for input(s): LABPROT, INR in the last 72 hours. ABG No results for input(s): PHART, HCO3 in the last 72 hours.  Invalid input(s): PCO2, PO2  Studies/Results: No results found.  Anti-infectives: Anti-infectives (From admission, onward)   Start     Dose/Rate Route Frequency Ordered Stop   11/26/18 1400  amoxicillin-clavulanate (AUGMENTIN) 875-125 MG per tablet 1 tablet     1 tablet Oral Every 12 hours 11/26/18 0907     11/26/18 0000  amoxicillin-clavulanate (AUGMENTIN) 875-125 MG tablet     1 tablet Oral Every 12 hours 11/26/18 0914 12/01/18 2359   11/23/18 1500  piperacillin-tazobactam (ZOSYN) IVPB 3.375 g  Status:  Discontinued     3.375 g 12.5 mL/hr over 240 Minutes Intravenous Every 8 hours 11/23/18 1426 11/26/18 0907      Assessment/Plan: s/p Procedure(s): LAPAROSCOPIC CHOLECYSTECTOMY (N/A) Advance diet  D/c drain Plan for d/c today  LOS: 4 days    Autumn Messing III 11/27/2018

## 2018-11-30 NOTE — Discharge Summary (Signed)
Physician Discharge Summary  Patient ID: Jonathan Miles MRN: [id number] DOB/AGE: [birth year] 58 y.o.  Admit date: 11/23/2018 Discharge date: 11/30/2018  Admit dx; Acute calculus cholecystitisssion Diagnoses Dilated aortic root/CAD - AVsparing ascending aortic root replacement with 30 mm Hemasheild graft. CAB x 1 to [place], 05/07/12 - Dr Lanelle Bal Hypertension Hyperlipidemia Diabetes Type II OSA - CPAP Severe arthritis - knees Hx asthma/tobacco use    Discharge Diagnoses:  Same  Active Problems:   Acute cholecystitis   PROCEDURES: laparoscopic cholecystectomy 1/29 Dr. Haydee Monica Course:  pt is a 58 y/o who developed acute pain on 11/19/18.  He was seen in the ED, symptoms were similar to findings 06/15/17.  A diagnosis of cholelithiasis was made and it was recommended he undergo cholecystectomy but he had just had his appendix out and was not ready.  Abd [place] on 1/24:Cholelithiasis. No other sonographic features to suggest acute cholecystitis. No biliary dilatation. Mildly increased echogenicity within the hepatic parenchyma, suggesting steatosis. Otherwise unremarkable and normal abdominal ultrasound.  Lipase 34, CMP was normal, WBC 11.8 H/H 13.4/40.4.  He was sent home with Percocet which helped.  He has not been able to eat or drink much all weekend because of the pain and nausea.  No vomiting since Friday 1/24.  He was seen in our office today with ongoing pain and nausea with PO intake.  Dr. Ninfa Linden recommended admission for acute cholecystitis, cholelithiasis.  He was admitted and seen the following Am by Dr. Marlou Starks.  Cardiology clearance was obtained from Dr. Fransico Him.  He was taken to the OR In the after noon 11/24/18.  Gangrenous gallbladder.  Pigmented stones.  Friable infundibulum and cystic duct. He did well.  He had a drain placed and initial plan was to go home with the drain.  He was mobilized and discharged home on 11/27/18, by Dr. Marlou Starks.   Drain was removed before discharge.    CBC Latest Ref Rng & Units 11/26/2018 11/25/2018 11/24/2018  WBC 4.0 - 10.5 K/uL 13.7(H) 19.6(H) 21.0(H)  Hemoglobin 13.0 - 17.0 g/dL 11.3(L) 10.9(L) 11.6(L)  Hematocrit 39.0 - 52.0 % 35.7(L) 34.4(L) 35.7(L)  Platelets 150 - 400 K/uL 325 294 253   CMP Latest Ref Rng & Units 11/25/2018 11/24/2018 11/23/2018  Glucose 70 - 99 mg/dL 201(H) 150(H) 153(H)  BUN 6 - 20 mg/dL 25(H) 18 22(H)  Creatinine 0.61 - 1.24 mg/dL 0.88 0.89 1.02  Sodium 135 - 145 mmol/L 136 138 138  Potassium 3.5 - 5.1 mmol/L 3.8 3.0(L) 3.0(L)  Chloride 98 - 111 mmol/L 101 104 100  CO2 22 - 32 mmol/L 26 25 26   Calcium 8.9 - 10.3 mg/dL 8.1(L) 8.3(L) 8.8(L)  Total Protein 6.5 - 8.1 g/dL 6.7 6.4(L) 7.3  Total Bilirubin 0.3 - 1.2 mg/dL 0.9 1.3(H) 1.7(H)  Alkaline Phos 38 - 126 U/L 125 112 107  AST 15 - 41 U/L 41 43(H) 31  ALT 0 - 44 U/L 66(H) 57(H) 46(H)   Abdominal US 11/19/18: Cholelithiasis. No other sonographic features to suggest acute cholecystitis. No biliary dilatation. 2. Mildly increased echogenicity within the hepatic parenchyma, suggesting steatosis. 3. Otherwise unremarkable and normal abdominal ultrasound.   Condition on DC:  Improved   Disposition: Home  Discharge Instructions    Call MD for:  difficulty breathing, headache or visual disturbances   Complete by:  As directed    Call MD for:  extreme fatigue   Complete by:  As directed    Call MD for:  hives   Complete by:  As directed    Call MD for:  persistant dizziness or light-headedness   Complete by:  As directed    Call MD for:  persistant nausea and vomiting   Complete by:  As directed    Call MD for:  redness, tenderness, or signs of infection (pain, swelling, redness, odor or green/yellow discharge around incision site)   Complete by:  As directed    Call MD for:  severe uncontrolled pain   Complete by:  As directed    Call MD for:  temperature >100.4   Complete by:  As directed    Diet - low sodium  heart healthy   Complete by:  As directed    Discharge instructions   Complete by:  As directed    May shower. Low fat diet. No heavy lifting. Cover draining sites as needed   Increase activity slowly   Complete by:  As directed    No wound care   Complete by:  As directed      Allergies as of 11/27/2018      Reactions   Ibuprofen Other (See Comments)   TOLD NOT TO TAKE-PER CARDS   Iodinated Diagnostic Agents Hives   Hives started 4 days after cta chest was performed, minimal relief w/ benadryl x 3 days, uncertain if reaction was actually iv contrast or other allergen,allergy tests to follow.wife will make [place] aware of results//a.calhoun   Quinolones       Medication List    STOP taking these medications   oxyCODONE-acetaminophen 5-325 MG tablet Commonly known as:  PERCOCET/ROXICET     TAKE these medications   acetaminophen 500 MG tablet Commonly known as:  TYLENOL TAKE 1-2 TABS EVERY 6 HOURS AS NEEDED FOR PAIN.  Do not take more than 4000 mg of Tylenol per day it can harm your liver. What changed:  additional instructions   amitriptyline 25 MG tablet Commonly known as:  ELAVIL Take 1 tablet (25 mg total) by mouth at bedtime.   amoxicillin-clavulanate 875-125 MG tablet Commonly known as:  AUGMENTIN Take 1 tablet by mouth every 12 (twelve) hours for 5 days.   ASPERCREME EX Apply 1 application topically daily as needed (pain).   aspirin 325 MG EC tablet Take 1 tablet (325 mg total) by mouth at bedtime.   atorvastatin 80 MG tablet Commonly known as:  LIPITOR Take 1 tablet (80 mg total) by mouth daily at 6 PM.   Blood Pressure Monitor/L Cuff Misc Please check blood pressure 2x per day   buPROPion 150 MG 24 hr tablet Commonly known as:  WELLBUTRIN XL Take 150 mg by mouth daily.   carvedilol 12.5 MG tablet Commonly known as:  COREG TAKE ONE TABLET BY MOUTH TWO TIMES A DAY   felodipine 10 MG 24 hr tablet Commonly known as:  PLENDIL TAKE ONE TABLET BY MOUTH  DAILY   furosemide 40 MG tablet Commonly known as:  LASIX TAKE ONE TABLET BY MOUTH DAILY   lidocaine 5 % ointment Commonly known as:  XYLOCAINE Apply 1 application topically as needed. Daily for leg pain   lisinopril 20 MG tablet Commonly known as:  PRINIVIL,ZESTRIL TAKE ONE TABLET BY MOUTH DAILY   loratadine 10 MG tablet Commonly known as:  CLARITIN Take 10 mg by mouth at bedtime.   metFORMIN 1000 MG tablet Commonly known as:  GLUCOPHAGE Take 1 tablet (1,000 mg total) by mouth 2 (two) times daily with a meal.   MYRBETRIQ 50  MG Tb24 tablet Generic drug:  mirabegron ER Take 50 mg by mouth at bedtime.   oxyCODONE 5 MG immediate release tablet Commonly known as:  Oxy IR/ROXICODONE Take 1 tablet (5 mg total) by mouth every 4 (four) hours as needed for moderate pain or severe pain.   sertraline 100 MG tablet Commonly known as:  ZOLOFT Take 1.5 tablets (150 mg total) by mouth daily.   tamsulosin 0.4 MG Caps capsule Commonly known as:  FLOMAX Take 2 capsules (0.8 mg total) by mouth daily after supper.   THERA-GESIC 0.5-15 % Crea Apply 1 application topically daily as needed (pain).      Follow-up Information    [facility] [place] Follow up on 11/30/2018.   Specialty:  General Surgery Why:  Drain check in our office with the PA; 11/30/18 at 1:45 PM.  Follow up appointment on 12/07/18 at 11 am.  Be at the office 30 minutes early for check in, bring photo ID and insurance information.   Contact information: [place] [address] [phone]           Signed: Earnstine Regal 11/30/2018, 3:43 PM

## 2019-01-31 ENCOUNTER — Other Ambulatory Visit: Payer: Self-pay | Admitting: Adult Health

## 2019-02-01 ENCOUNTER — Telehealth: Payer: Self-pay

## 2019-02-01 NOTE — Telephone Encounter (Signed)
LEft vm for patient that he downloaded the webex icon his phone or lap top to do visit. ALso need to make sure he receive the email invite to join meeting on Thursday at 0945am.

## 2019-02-01 NOTE — Telephone Encounter (Signed)
He has it downloaded and has received the email

## 2019-02-03 ENCOUNTER — Encounter: Payer: Self-pay | Admitting: Adult Health

## 2019-02-03 ENCOUNTER — Other Ambulatory Visit: Payer: Self-pay

## 2019-02-03 ENCOUNTER — Ambulatory Visit (INDEPENDENT_AMBULATORY_CARE_PROVIDER_SITE_OTHER): Payer: BLUE CROSS/BLUE SHIELD | Admitting: Adult Health

## 2019-02-03 DIAGNOSIS — G4733 Obstructive sleep apnea (adult) (pediatric): Secondary | ICD-10-CM | POA: Diagnosis not present

## 2019-02-03 DIAGNOSIS — Z8673 Personal history of transient ischemic attack (TIA), and cerebral infarction without residual deficits: Secondary | ICD-10-CM

## 2019-02-03 DIAGNOSIS — S0990XS Unspecified injury of head, sequela: Secondary | ICD-10-CM

## 2019-02-03 DIAGNOSIS — I10 Essential (primary) hypertension: Secondary | ICD-10-CM

## 2019-02-03 DIAGNOSIS — E119 Type 2 diabetes mellitus without complications: Secondary | ICD-10-CM

## 2019-02-03 DIAGNOSIS — Z9989 Dependence on other enabling machines and devices: Secondary | ICD-10-CM

## 2019-02-03 DIAGNOSIS — E785 Hyperlipidemia, unspecified: Secondary | ICD-10-CM

## 2019-02-03 DIAGNOSIS — R5383 Other fatigue: Secondary | ICD-10-CM

## 2019-02-03 MED ORDER — MODAFINIL 200 MG PO TABS: 200.0000 mg | ORAL_TABLET | Freq: Every day | ORAL | 0 refills | Status: DC

## 2019-02-03 NOTE — Progress Notes (Signed)
[facility] [address] [place]. [place] [address] [phone]       OFFICE FOLLOW UP NOTE  Jonathan Miles Date of Birth:  May 20, 1961 Medical Record Number:  [id number]     Virtual Visit via Telephone Note  I connected with Jonathan Miles on 02/03/19 at  9:45 AM EDT by telephone located remotely within my own home and verified that I am speaking with the correct person using two identifiers who reports being located within his own home.    I discussed the limitations, risks, security and privacy concerns of performing an evaluation and management service by telephone and the availability of in person appointments. I also discussed with the patient that there may be a patient responsible charge related to this service. The patient expressed understanding and agreed to proceed.   CHIEF COMPLAINT:  Chief Complaint  Patient presents with  . Follow-up    excessive day time fatigue post stroke    HPI:   Jonathan Miles is a 58 year old male with underlying medical history of DM, HTN, HLD, OSA on CPAP, CAD and left pons infarct in 11/2017. He has been followed in this office since his left pons infarct in 11/2017.  He was initially scheduled today for an in office face-to-face visit but due to COVID-19 safety precautions, visit transitioned to telemedicine via WebEx.  He unfortunately had difficulties using WebEx due to his Internet speed where he was constantly freezing and disconnecting and therefore transitioned to telephone visit.  He continues to have residual stroke deficits of excessive fatigue, headache and depression.  His fatigue has been so severe that he attempted to return to work but unable to fulfill his duties satisfactorily and ultimately was let go from his position.  He is currently on Social Security disability at this time.  He states his depression has been managed with use of Zoloft and Wellbutrin which has been prescribed by PCP.  After prior visit  on 11/03/2018, amitriptyline 25 mg initiated due to ongoing headaches.  He endorses occasional frontal/occipital headaches occurring 2-3 times monthly and is typically associated with increased activity leading to increased fatigue.  He has noticed mild benefit with use of amitriptyline.  He is able to do daytime activities for approximately 1 to 2 hours and then will have to rest.  He does have underlying OSA and has been compliant with CPAP.  He denies having this excessive daytime fatigue prior to his stroke.  He has continued on aspirin greater 5 mg without side effects of bleeding or bruising.  Continues on atorvastatin without side effects of myalgias.  Blood pressure stable typically ranging 130s/80s.  No further concerns at this time.  Denies new or worsening stroke/TIA symptoms.     OBJECTIVE:  Physical Exam  *Limited due to visit type*  Epworth sleepiness scale: 15 Fatigue severity scale: 59    Office Visit from 02/03/2019 in [facility]  PHQ-2 Total Score  0       General: Pleasant middle-aged male answering and asking questions appropriately throughout conversation with noted flat affect  Neurologic Exam Mental Status: Awake and fully alert.  No dysarthria present.  Oriented to place and time. Recent and remote memory intact. Attention span, concentration and fund of knowledge appropriate. Mood and affect appropriate.   No recent imaging or lab work to review      ASSESSMENT: Jonathan Miles is a 58 y.o. 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 Vascular risk factors include HTN, HLD, DM, OSA on CPAP and CAD.  He has been stable from a stroke standpoint without new or worsening stroke/TIA symptoms but continues to have residual deficit of excessive daytime fatigue likely contributing to afternoon headaches.    PLAN:  -Due to excessive daytime fatigue as evidenced by subjective report along with high score  on Epworth sleepiness scale and fatigue severity scale, will initiate modafinil 200 mg daily.  His depression is currently stable with PHQ 2: 0.  He was advised to monitor for worsening depression, agitation or anxiety and to notify office if this occurs.  Blood pressure has been stable and advised to continue to monitor BP and heart rate and to notify office if increase level.  Additional information regarding modafinil provided to patient via my chart. -Continue amitriptyline 25 mg nightly and will consider discontinuing in the future once stable on modafinil -He will continue Zoloft and Wellbutrin for depression with ongoing monitoring by PCP -Continue aspirin 325 mg daily  and lipitor  for secondary stroke prevention -F/u with PCP regarding your HTN, HLD, DM and depression management  -Ongoing compliance with CPAP for OSA management -Continue to stay active during the day and maintain a healthy diet -Maintain strict control of hypertension with blood pressure goal below 130/90, diabetes with hemoglobin A1c goal below 6.5% and cholesterol with LDL cholesterol (bad cholesterol) goal below 70 mg/dL. I also advised the patient to eat a healthy diet with plenty of whole grains, cereals, fruits and vegetables, exercise regularly and maintain ideal body weight.  Follow up in 3 months or call earlier if needed    I discussed the assessment and treatment plan with the patient.  The patient was provided an opportunity to ask questions and all were answered to their satisfaction. The patient agreed with the plan and verbalized an understanding of the instructions.   I provided 26 minutes of non-face-to-face time during this encounter.    Venancio Poisson, AGNP-BC  [facility] [address] [place], [place] [address]  Phone [phone] Fax [phone] Note: This document was prepared with digital dictation and possible smart phrase technology. Any  transcriptional errors that result from this process are unintentional.

## 2019-02-04 ENCOUNTER — Encounter: Payer: Self-pay | Admitting: Adult Health

## 2019-02-06 NOTE — Progress Notes (Signed)
I agree with the above plan 

## 2019-02-09 ENCOUNTER — Telehealth: Payer: Self-pay

## 2019-02-09 NOTE — Telephone Encounter (Signed)
PA done for Modafinil on cover my meds.

## 2019-02-10 ENCOUNTER — Encounter: Payer: Self-pay | Admitting: Adult Health

## 2019-02-10 MED ORDER — ARMODAFINIL 150 MG PO TABS: 150.0000 mg | ORAL_TABLET | Freq: Every day | ORAL | 0 refills | Status: DC

## 2019-02-10 NOTE — Telephone Encounter (Signed)
PT sent mychart message of medication being change to armodafinil.

## 2019-02-10 NOTE — Telephone Encounter (Signed)
Fax receive from [employer] generic modafinil was denied. The medication is covered when generic armodafinil has been tried and failed and it does not work or pt could not tolerated it. Utilization management guidelines used. [employer] agents for narcolepsy.Contact number is [phone].

## 2019-02-10 NOTE — Telephone Encounter (Signed)
Armodafinil 150 mg daily placed as insurance denied modafinil and recommend a trial of armodafinil first

## 2019-02-10 NOTE — Addendum Note (Signed)
Addended by: Venancio Poisson on: 02/10/2019 08:14 AM   Modules accepted: Orders

## 2019-03-01 ENCOUNTER — Encounter: Payer: Self-pay | Admitting: Adult Health

## 2019-03-03 ENCOUNTER — Other Ambulatory Visit: Payer: Self-pay | Admitting: Adult Health

## 2019-03-03 DIAGNOSIS — R5383 Other fatigue: Secondary | ICD-10-CM

## 2019-03-03 DIAGNOSIS — G4733 Obstructive sleep apnea (adult) (pediatric): Secondary | ICD-10-CM

## 2019-03-03 MED ORDER — MODAFINIL 200 MG PO TABS: 200.0000 mg | ORAL_TABLET | Freq: Every day | ORAL | 0 refills | Status: DC

## 2019-04-01 ENCOUNTER — Other Ambulatory Visit: Payer: Self-pay | Admitting: Adult Health

## 2019-04-01 DIAGNOSIS — G4733 Obstructive sleep apnea (adult) (pediatric): Secondary | ICD-10-CM

## 2019-04-01 DIAGNOSIS — R5383 Other fatigue: Secondary | ICD-10-CM

## 2019-04-04 ENCOUNTER — Other Ambulatory Visit: Payer: Self-pay

## 2019-04-04 ENCOUNTER — Telehealth: Payer: Self-pay

## 2019-04-04 DIAGNOSIS — R5383 Other fatigue: Secondary | ICD-10-CM

## 2019-04-04 DIAGNOSIS — G4733 Obstructive sleep apnea (adult) (pediatric): Secondary | ICD-10-CM

## 2019-04-04 MED ORDER — MODAFINIL 200 MG PO TABS: 200.0000 mg | ORAL_TABLET | Freq: Every day | ORAL | 0 refills | Status: DC

## 2019-04-04 MED ORDER — MODAFINIL 200 MG PO TABS: 200.0000 mg | ORAL_TABLET | Freq: Every day | ORAL | 3 refills | Status: DC

## 2019-04-04 MED ORDER — AMITRIPTYLINE HCL 25 MG PO TABS: 25.0000 mg | ORAL_TABLET | Freq: Every day | ORAL | 3 refills | Status: DC

## 2019-04-04 NOTE — Addendum Note (Signed)
Addended by: Venancio Poisson on: 04/04/2019 12:43 PM   Modules accepted: Orders

## 2019-05-03 ENCOUNTER — Ambulatory Visit: Payer: Self-pay | Admitting: Adult Health

## 2019-07-05 ENCOUNTER — Encounter: Payer: Self-pay | Admitting: Adult Health

## 2019-07-05 ENCOUNTER — Other Ambulatory Visit: Payer: Self-pay

## 2019-07-05 ENCOUNTER — Ambulatory Visit (INDEPENDENT_AMBULATORY_CARE_PROVIDER_SITE_OTHER): Payer: BC Managed Care – PPO | Admitting: Adult Health

## 2019-07-05 VITALS — BP 116/70 | HR 75 | Temp 97.8°F | Ht 75.0 in | Wt 295.8 lb

## 2019-07-05 DIAGNOSIS — E785 Hyperlipidemia, unspecified: Secondary | ICD-10-CM

## 2019-07-05 DIAGNOSIS — R5383 Other fatigue: Secondary | ICD-10-CM

## 2019-07-05 DIAGNOSIS — G4733 Obstructive sleep apnea (adult) (pediatric): Secondary | ICD-10-CM

## 2019-07-05 DIAGNOSIS — R519 Headache, unspecified: Secondary | ICD-10-CM

## 2019-07-05 DIAGNOSIS — E119 Type 2 diabetes mellitus without complications: Secondary | ICD-10-CM

## 2019-07-05 DIAGNOSIS — Z8673 Personal history of transient ischemic attack (TIA), and cerebral infarction without residual deficits: Secondary | ICD-10-CM

## 2019-07-05 DIAGNOSIS — F329 Major depressive disorder, single episode, unspecified: Secondary | ICD-10-CM

## 2019-07-05 DIAGNOSIS — Z9989 Dependence on other enabling machines and devices: Secondary | ICD-10-CM

## 2019-07-05 DIAGNOSIS — R51 Headache: Secondary | ICD-10-CM

## 2019-07-05 DIAGNOSIS — I10 Essential (primary) hypertension: Secondary | ICD-10-CM | POA: Diagnosis not present

## 2019-07-05 NOTE — Patient Instructions (Signed)
Referral to Dr. Sima Matas for further evaluation of post stroke depression in regards to your continued fatigue   Schedule f/u visit with your CPAP provider to ensure adequate management of your sleep apnea  Continue modafinil, amitriptyline and Zoloft and wellbutrin   Continue aspirin 325 mg daily  and lipitor  for secondary stroke prevention  Continue to follow up with PCP regarding cholesterol, blood pressure and diabetes management   Continue to monitor blood pressure at home  Maintain strict control of hypertension with blood pressure goal below 130/90, diabetes with hemoglobin A1c goal below 6.5% and cholesterol with LDL cholesterol (bad cholesterol) goal below 70 mg/dL. I also advised the patient to eat a healthy diet with plenty of whole grains, cereals, fruits and vegetables, exercise regularly and maintain ideal body weight.  Followup in the future with me in 3 months or call earlier if needed       Thank you for coming to see [place] at [facility]. I hope we have been able to provide you high quality care today.  You may receive a patient satisfaction survey over the next few weeks. We would appreciate your feedback and comments so that we may continue to improve ourselves and the health of our patients.

## 2019-07-05 NOTE — Progress Notes (Signed)
[facility] [address] [place]. [place] [address] [phone]       OFFICE FOLLOW UP NOTE  [name] Date of Birth:  [birth year] Medical Record Number:  [id number]   Reason for visit: Stroke follow up [place] provider: Dr. Leonie Man  CHIEF COMPLAINT:  Chief Complaint  Patient presents with   Follow-up    treatment room, alone. "Still having fatigue.Had a bout of depression"     HPI:   Stroke admission 12/16/2017: [name] is a 58 year old male with PMH of DM, HTN, HLD, OSA on CPAP and CAD who presented with acute onset of dizziness, slurred speech and ataxia on 12/16/2017.  CT head reviewed and showed no intracranial abnormality.  MRI brain reviewed and did show an acute infarct in the left pons.  Bilateral carotid ultrasound performed and showed bilateral ICA stenosis of 1-39%.  2D echo performed and showed an EF of 55-60% with moderate LVH. LDL 82 and was recommended to increase Lipitor from 40 mg to 80 mg.  A1c 5.5.  Patient was previously on aspirin 81 mg and was recommended to increase his to aspirin 325 mg daily.  Patient was discharged home in stable condition.  Initial visit  01/27/2018: since discharge, patient has been participating in PT/OT.  He is accompanied at today's appointment with his wife.  He completed OT but continues PT 2 times per week.  He is making great improvement but does continue to exhibit some right hemiplegia.  He does complain about frequent fatigue especially after PT sessions or with increased activity.  He does continue to take aspirin without increase in bruising or bleeding.  Continues to take Lipitor with mild aches but able to tolerate them and these are improving.  Today's blood pressure satisfactory at 135/87.  Patient does have OSA and is compliant with CPAP machine.  He does have complaints of a headache that occurs on a weekly basis that has been occurring for the past 2 years.  He relates these to stress.  They start in  the back of his neck and go up into his head and describes it as pressure.  Does have mild photophobia and phonophobia but denies nausea and vomiting.  Does take Tylenol which does seem to relieve these headaches.  He does continue to take Zoloft which has been helping with depression and anxiety. No new or worsening stroke/TIA symptoms.  02/24/2018 visit: Patient returns today for 59-month follow-up.  He is being reevaluated for possible return to work.  He recently completed physical therapy as he has made great improvement in his strength.  He does still have complaints of increased fatigue after moderate activity where his symptoms such as dizziness or weakness can become more pronounced.  He states he was able to mow the yard which took him approximately 20 minutes and patient had to lay down afterwards due to extreme fatigue.  Blood pressure at today's visit 162/94 but states he has not taken his medications yet and typically 130 over 80s at home.  Continues to be compliant with CPAP for OSA.  Continues to take aspirin without side effects of bleeding or bruising.  Continues to take atorvastatin without side effects of myalgias.  Concern for patient to return to work full-time as he does have a high activity demanding job such as lifting heavy objects or climbing on high ladders.  Concern for if  increase in fatigue does occur, he can become weaker or have episodes of dizziness.  Recommend patient  states to return to work on a part-time basis only at this time.  MRA head and neck performed on 02/03/2018 which did show a normal MRA of neck but MRI of head did show possible arthrosclerosis and left vertebral artery but per report, this could also represent artifact.  All remainder medium to large sized intracranial vessels appeared unremarkable.  03/10/18 visit: Patient returns today for 59-week follow-up for possible return to work and is accompanied by his wife.  It was recommended that patient return to work  part-time at previous visit but with limitations such as not climbing ladders or high elevations or lifting heavy objects.  He continues to feel increased fatigue after activities that prior to the stroke would not make him feel as fatigued.  For example, patient mowed grass this morning with a push mower and feels as though he needed to lay down.  He will start to have difficulty walking, balance issues and a headache with this increased fatigue.  He does only receive approximately 5 to 6 hours of sleep a night due to being restless and "mind going constantly".  Patient does have history of OSA and is compliant with CPAP machine.  Patient does state that he has had increased stress and depression recently due to job stress and being unable to go back to work.  Currently on Zoloft 100 mg.  Patient questioning resources regarding vocational rehab or possibly taking early retirement and would like more information or resources on these topics.  Patient continues to not be safe at this time to return to work full-time or even part-time without restrictions as patient's job consists of climbing on ladders or working at [employer] or lifting heavy objects.  As patient fatigues quickly over smaller jobs/activities, patient would not be safe on ladders or higher elevations due to increase of difficulty walking and balance issues with increased activity and fatigue.  Denies new or worsening stroke/TIA symptoms.  05/03/18 UPDATE: Patient is being seen today for follow-up visit for patient request for short-term disability extension due to continued fatigue.  Patient states that he he has no energy to complete certain tasks especially household work or yard work and this is not like him as he was previously very active person.  He has had increased depression with anxiety and his PCP recently started him on Wellbutrin.  His BP has been increased and at today's appointment 171/92 (the patient did not take antihypertensives  today) but was also elevated at last week's appointment with PCP at 160/84.  Antihypertensives were not adjusted at PCP visit due to thought of possible increased BP due to anxiety/depression.  Patient does not check BP at home as they do not have a working BP cuff.  Also complains of new onset headaches for approximately the past 3 weeks that occur 2 times per week and are localized in the frontal region and back of head/neck area with stiffness in his neck and are accompanied by phonophobia, at times blurring of vision, and are debilitating.  Patient has taken Tylenol 500 mg which "takes the edge off" but also makes him fatigued and was told in the past to limit amount of Tylenol.  Patient does have a history of headaches and has been on Topamax in the past but was unable to tolerate Topamax.  Patient does have a history of OSA and is compliant with CPAP but has not had follow-up appointment for OSA since diagnosed in 2012.  At this time, patient feels as though  he is unable to return to work due to continued excessive fatigue, headaches, and uncontrolled blood pressure denies new or worsening stroke/TIA symptoms.  Update 11/03/2018: Patient is being seen today for 14-month follow-up.  He does still continue to have daytime fatigue and denies any improvement over time but denies worsening.  He does continue to use CPAP for OSA management.  He states that he will have some good days where he is able to do more activities and then some bad days but typically depending on how he is sleeping at night.  He does endorse difficulty sleeping at night over the past few months which has been making his daytime fatigue worse.  He continues to experience occasional headaches that occur approximately 1 time per week and may last for a few hours but typically subside after taking Tylenol.  Continues to be located frontal and occipital portion of his head and at times can experience blurry vision and photophobia.  Denies  nausea/vomiting.  He feels as though his depression has "leveled off" where he feels a slight improvement but does not feel as though he is returned to his "normal self before my stroke".  He does continue to take Zoloft and Wellbutrin which is prescribed by his PCP.  He also continues to experience cognitive deficits since his stroke.  Continues to take aspirin without side effects of bleeding or bruising.  Continues to take atorvastatin without side effects myalgias.  Blood pressure today 134/88.  He is currently in the process of applying for long-term disability.  No further concerns at this time.  Denies new or worsening stroke/TIA symptoms.  Virtual visit update 02/03/2019: Mr. Losch is a 58 year old male with underlying medical history of DM, HTN, HLD, OSA on CPAP, CAD and left pons infarct in 11/2017. He has been followed in this office since his left pons infarct in 11/2017.  He continues to have residual stroke deficits of excessive fatigue, headache and depression.  His fatigue has been so severe that he attempted to return to work but unable to fulfill his duties satisfactorily and ultimately was let go from his position.  He is currently on Social Security disability at this time.  He states his depression has been managed with use of Zoloft and Wellbutrin which has been prescribed by PCP.  After prior visit on 11/03/2018, amitriptyline 25 mg initiated due to ongoing headaches.  He endorses occasional frontal/occipital headaches occurring 2-3 times monthly and is typically associated with increased activity leading to increased fatigue.  He has noticed mild benefit with use of amitriptyline.  He is able to do daytime activities for approximately 1 to 2 hours and then will have to rest.  He does have underlying OSA and has been compliant with CPAP.  He denies having this excessive daytime fatigue prior to his stroke.  He has continued on aspirin 325mg  daily without side effects of bleeding or bruising.   Continues on atorvastatin without side effects of myalgias.  Blood pressure stable typically ranging 130s/80s.  No further concerns at this time.  Denies new or worsening stroke/TIA symptoms.  Update 07/05/2019: Mr. Berrocal is being seen today for stroke follow-up.  Residual stroke deficits of excessive daytime fatigue, headache and depression.  Initiated modafinil at prior visit but unfortunately insurance declined therefore initiated armodafinil to assist with daytime fatigue.  After 2-week duration, he did not notice any improvement or benefit with use of armodafinil therefore transition back to modafinil 200 mg daily.  Continues on  modafinil 200 mg daily with slight improvement of fatigue especially in the morning. Fatigue worsens mid afternoon and evening typically or with increased activity.  He does continue to take daily naps.  He continues on amitriptyline for headache management.  Headaches have been stable with 1-2 monthly.  Depression has been mostly stable with use of Zoloft and Wellbutrin which is managed by PCP. Can have intermittent difficulty with depression accompanied by decreased energy, endurance, groggy feeling and decreased appetite which may last for couple days and then resolve.  He endorses ongoing compliance with CPAP for OSA management.  He has not had any recent follow-ups with sleep apnea provider since prior to his stroke.  He continues on aspirin and atorvastatin for secondary stroke prevention without side effects.  Blood pressure stable at 116/70.  No further concerns at this time.     ROS:   Review of Systems  Constitutional: Positive for malaise/fatigue.  HENT: Negative.   Eyes: Positive for photophobia.  Respiratory: Positive for shortness of breath.   Cardiovascular: Negative.   Gastrointestinal: Positive for constipation, diarrhea and nausea.  Genitourinary: Positive for frequency and urgency.  Musculoskeletal: Negative.   Skin: Negative.   Neurological:  Positive for dizziness, speech change (post stroke) and weakness.  Endo/Heme/Allergies: Negative.   Psychiatric/Behavioral: Positive for depression and memory loss.     PMH:  Past Medical History:  Diagnosis Date   Alcohol abuse    Aortic root dilatation ([place])    a. echo 4/12: EF 55-60%, mod to marked dilated ascending aortic root;   b. MRA 4/12: Aortic root 5.5 cm, dilation of left and right coronary cusps, trileaflet aortic valve // Echo 2/19: Moderate LVH, EF 55-60, normal wall motion, grade 1 diastolic dysfunction, mild AI, mildly dilated aortic root, moderate to severe LAE (aortic root 40 mm; ascending aorta 39 mm)    Arthritis    Asthma    ast attack in early 20's   CAD (coronary artery disease)    Chest pain    Myoview 4/12: EF 62%, no ischemia or scar   CHF (congestive heart failure) (HCC)    Chronic pain    back and hands   Constipation    COPD (chronic obstructive pulmonary disease) (HCC)    Depression    Diabetes mellitus    type 2   Difficulty speaking    Drug use    Enlarged prostate    Fatigue    GERD (gastroesophageal reflux disease)    modifies with diet   Hx of echocardiogram    Echo (2/16):  Mod LVH, EF 55-60%, mild to mod AI, mod LAE, mild RAE, Aortic root 38 mm, Asc aorta 43 mm   Hyperlipidemia    Hypertension    Joint pain    Knee pain, bilateral    Leg pain    ABIs 3/19: Normal (R 1.3; L 1.28)   OSA (obstructive sleep apnea) 2006   .  Wears at times   Osteoarthritis    Palpitations    Stroke [facility])     PSH:  Past Surgical History:  Procedure Laterality Date   APPENDECTOMY  12/2016   CHOLECYSTECTOMY N/A 11/24/2018   Procedure: LAPAROSCOPIC CHOLECYSTECTOMY;  Surgeon: Stark Klein, MD;  Location: [facility];  Service: General;  Laterality: N/A;   Colonscopy     CORONARY ARTERY BYPASS GRAFT  05/07/2012   Procedure: CORONARY ARTERY BYPASS GRAFTING (CABG);  Surgeon: Grace Isaac, MD;  Location: [facility];  Service: Open  Heart Surgery;  Laterality: N/A;   KNEE ARTHROSCOPY Left 2010   Dr. Rosamaria Lints   LAPAROSCOPIC APPENDECTOMY N/A 01/06/2017   Procedure: APPENDECTOMY LAPAROSCOPIC;  Surgeon: Ralene Ok, MD;  Location: [facility];  Service: General;  Laterality: N/A;   Retactment of fingers     as a child , sewed with sewing machine- Index and middle finger   UPPER GASTROINTESTINAL ENDOSCOPY      Social History:  Social History   Socioeconomic History   Marital status: Married    Spouse name: General Filosa   Number of children: 0   Years of education: 12th grade   Highest education level: Not on file  Occupational History   Occupation: Fish farm manager for [employer]    Employer: [employer]  Social Designer, fashion/clothing strain: Not on file   Food insecurity    Worry: Not on file    Inability: Not on file   Transportation needs    Medical: Not on file    Non-medical: Not on file  Tobacco Use   Smoking status: Former Smoker    Packs/day: 1.00    Years: 13.00    Pack years: 13.00    Types: Cigarettes    Quit date: 01/23/1988    Years since quitting: 31.4   Smokeless tobacco: Never Used  Substance and Sexual Activity   Alcohol use: Yes    Comment: quit 1989   Drug use: No    Comment: previous cocaine and marijuana use, quit 1988   Sexual activity: Not Currently    Partners: Female  Lifestyle   Physical activity    Days per week: Not on file    Minutes per session: Not on file   Stress: Not on file  Relationships   Social connections    Talks on phone: Not on file    Gets together: Not on file    Attends religious service: Not on file    Active member of club or organization: Not on file    Attends meetings of clubs or organizations: Not on file    Relationship status: Not on file   Intimate partner violence    Fear of current or ex partner: Not on file    Emotionally abused: Not on file    Physically abused: Not on file     Forced sexual activity: Not on file  Other Topics Concern   Not on file  Social History Narrative   Married, lives in [place]. Therapist, occupational for [employer].    Pt is adopted. Unsure of his family medical history.    Family History:  Family History  Adopted: Yes  Family history unknown: Yes    Medications:   Current Outpatient Medications on File Prior to Visit  Medication Sig Dispense Refill   acetaminophen (TYLENOL) 500 MG tablet TAKE 1-2 TABS EVERY 6 HOURS AS NEEDED FOR PAIN.  Do not take more than 4000 mg of Tylenol per day it can harm your liver. 30 tablet 0   amitriptyline (ELAVIL) 25 MG tablet Take 1 tablet (25 mg total) by mouth at bedtime. 30 tablet 3   aspirin 325 MG EC tablet Take 1 tablet (325 mg total) by mouth at bedtime. 90 tablet 3   atorvastatin (LIPITOR) 80 MG tablet Take 1 tablet (80 mg total) by mouth daily at 6 PM. 90 tablet 3   buPROPion (WELLBUTRIN XL) 150 MG 24 hr tablet Take 150 mg by mouth daily.      carvedilol (COREG)  12.5 MG tablet TAKE ONE TABLET BY MOUTH TWO TIMES A DAY (Patient taking differently: Take 12.5 mg by mouth 2 (two) times daily. ) 180 tablet 2   ezetimibe (ZETIA) 10 MG tablet      felodipine (PLENDIL) 10 MG 24 hr tablet TAKE ONE TABLET BY MOUTH DAILY (Patient taking differently: Take 10 mg by mouth daily. ) 90 tablet 2   furosemide (LASIX) 40 MG tablet TAKE ONE TABLET BY MOUTH DAILY (Patient taking differently: Take 40 mg by mouth daily. ) 90 tablet 2   lidocaine (XYLOCAINE) 5 % ointment Apply 1 application topically as needed. Daily for leg pain 50 g 3   lisinopril (PRINIVIL,ZESTRIL) 20 MG tablet TAKE ONE TABLET BY MOUTH DAILY (Patient taking differently: Take 20 mg by mouth daily. ) 90 tablet 2   loratadine (CLARITIN) 10 MG tablet Take 10 mg by mouth at bedtime.      Menthol-Methyl Salicylate (THERA-GESIC) 0.5-15 % CREA Apply 1 application topically daily as needed (pain).     metFORMIN (GLUCOPHAGE) 1000 MG tablet  Take 1 tablet (1,000 mg total) by mouth 2 (two) times daily with a meal. 180 tablet 3   mirabegron ER (MYRBETRIQ) 50 MG TB24 tablet Take 50 mg by mouth at bedtime.      modafinil (PROVIGIL) 200 MG tablet Take 1 tablet (200 mg total) by mouth daily. 30 tablet 0   oxyCODONE (OXY IR/ROXICODONE) 5 MG immediate release tablet Take 1 tablet (5 mg total) by mouth every 4 (four) hours as needed for moderate pain or severe pain. 15 tablet 0   sertraline (ZOLOFT) 100 MG tablet Take 1.5 tablets (150 mg total) by mouth daily. 135 tablet 3   tamsulosin (FLOMAX) 0.4 MG CAPS capsule Take 2 capsules (0.8 mg total) by mouth daily after supper. 180 capsule 3   Trolamine Salicylate (ASPERCREME EX) Apply 1 application topically daily as needed (pain).     No current facility-administered medications on file prior to visit.     Allergies:   Allergies  Allergen Reactions   Ibuprofen Other (See Comments)    TOLD NOT TO TAKE-PER CARDS   Iodinated Diagnostic Agents Hives    Hives started 4 days after cta chest was performed, minimal relief w/ benadryl x 3 days, uncertain if reaction was actually iv contrast or other allergen,allergy tests to follow.wife will make [place] aware of results//a.[email or web address]   Quinolones Other (See Comments)     Physical Exam  Vitals:   07/05/19 0854  BP: 116/70  Pulse: 75  Temp: 97.8 F (36.6 C)  Weight: 295 lb 12.8 oz (134.2 kg)  Height: 6\' 3"  (1.905 m)   Body mass index is 36.97 kg/m. No exam data present   General: Obese pleasant middle-age male, seated, in no evident distress but flat affect Head: head normocephalic and atraumatic.   Neck: supple with no carotid or supraclavicular bruits Cardiovascular: regular rate and rhythm, no murmurs Musculoskeletal: no deformity Skin:  no rash/petichiae Vascular:  Normal pulses all extremities  Neurologic Exam Mental Status: Awake and fully alert.  Occasional dysarthria during conversation.  Oriented to place and time.  Recent and remote memory intact. Attention span, concentration and fund of knowledge appropriate. Mood and affect appropriate.  Cranial Nerves: Pupils equal, briskly reactive to light. Extraocular movements full without nystagmus. Visual fields full to confrontation. Hearing intact. Facial sensation intact.   Motor: Full strength in all tested extremities Sensory.: intact to touch , pinprick , position and vibratory sensation.  Coordination: Rapid alternating movements  normal in all extremities. Finger-to-nose and heel-to-shin performed accurately bilaterally.   Gait and Station: Arises from chair without difficulty. Stance is normal. Gait demonstrates normal stride and balance.  Able to heel, toe and tandem walk with mild difficulty.  Reflexes: 1+ and symmetric. Toes downgoing.    Diagnostic Data (Labs, Imaging, Testing)  MRA HEAD WO CONTRAST 02/03/18 IMPRESSION:  MRA head (without) demonstrating: - Hypoplastic left vertebral artery with focal area of decreased flow signal in the distal segment.  While this can be seen with focal atherosclerosis, this more likely represents area of technical artifact, especially when correlated with normal-appearing flow signal on MRA neck from same day. - Remainder of medium to large sized intracranial vessels appear unremarkable.  MRA NECK WO CONTRAST 02/03/18 IMPRESSION:  Normal MRA neck (with and without).   Ct Head Wo Contrast Result Date: 12/16/2017  IMPRESSION: 1. No evidence of acute intracranial abnormality. 2. Mild chronic small vessel ischemic disease.   [name] Result Date: 12/16/2017 IMPRESSION: 1 cm acute infarct left pons Mild to moderate chronic microvascular ischemic changes in the cerebral white matter bilaterally. Electronically Signed      Echocardiogram:      12/17/2017                                     Study Conclusions - Left ventricle: The cavity size was normal. Wall thickness was increased in a pattern of  moderate LVH. Systolic function was normal. The estimated ejection fraction was in the range of 55% to 60%. Wall motion was normal; there were no regional wall motion abnormalities. Doppler parameters are consistent with abnormal left ventricular relaxation (grade 1 diastolic dysfunction). - Aortic valve: There was mild regurgitation. Valve area (VTI): 3.48 cm^2. Valve area (Vmax): 3.49 cm^2. Valve area (Vmean): 3.31 cm^2. - Aortic root: The aortic root was mildly dilated. - Left atrium: The atrium was moderately to severely dilated.  B/L Carotid U/S:   12/17/2017                                         Final Interpretation: Right Carotid: Velocities in the right ICA are consistent with a 1-39% stenosis. Left Carotid: Velocities in the left ICA are consistent with a 1-39% stenosis. Vertebrals: Both vertebral arteries were patent with antegrade flow. Subclavians: Normal flow hemodynamics were seen in bilateral subclavian arteries.    ASSESSMENT: Jonathan Miles is a 58 y.o. 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 Vascular risk factors include HTN, HLD, DM, and CAD.  Residual deficits excessive daytime fatigue, headaches, depression and occasional mild dysarthria.     PLAN: -Long discussion regarding ongoing post stroke fatigue and question possible association with ongoing depression not fully managed.  Highly recommended evaluation by neuropsychology for further evaluation of ongoing depression despite attempts at medication management post stroke.  He was agreeable to this evaluation and referral placed to Dr. Sima Matas -Advised to continue sertraline 150 mg daily and bupropion 300mg  daily currently managed by PCP -Continue amitriptyline 25 mg nightly for headache management -Continue modafinil at this time but may consider discontinuing in the future as only small benefit provided -Advised patient to schedule  follow-up visit with sleep apnea provider to ensure adequate OSA management with CPAP use -Continue aspirin 325  mg daily  and lipitor  for secondary stroke prevention -F/u with PCP regarding your HTN, HLD, DM and depression management  -Recommended increasing activity during the day along with maintaining a healthy diet -Maintain strict control of hypertension with blood pressure goal below 130/90, diabetes with hemoglobin A1c goal below 6.5% and cholesterol with LDL cholesterol (bad cholesterol) goal below 70 mg/dL. I also advised the patient to eat a healthy diet with plenty of whole grains, cereals, fruits and vegetables, exercise regularly and maintain ideal body weight.  Follow up in 3 months or call earlier if needed  Greater than 50% time during this 25 minute consultation visit was spent on counseling and discussion regarding ongoing difficulties with daytime fatigue and depression, coordination of care about HLD, HTN, OSA and DM for secondary stroke prevention and answered all questions to patient satisfaction  Frann Rider, AGNP-BC  [facility] [address] [place], [place] [address]  Phone [phone] Fax [phone]    OBJECTIVE:  Physical Exam  *Limited due to visit type*  Epworth sleepiness scale: 15 Fatigue severity scale: 59    Office Visit from 02/03/2019 in [facility]  PHQ-2 Total Score  0       General: Pleasant middle-aged male answering and asking questions appropriately throughout conversation with noted flat affect  Neurologic Exam Mental Status: Awake and fully alert.  No dysarthria present.  Oriented to place and time. Recent and remote memory intact. Attention span, concentration and fund of knowledge appropriate. Mood and affect appropriate.   No recent imaging or lab work to review      ASSESSMENT: Jonathan Miles is a 58 y.o. 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 Vascular risk factors include HTN, HLD, DM, OSA on CPAP and CAD.  He has been stable from a stroke standpoint without new or worsening stroke/TIA symptoms but continues to have residual deficit of excessive daytime fatigue likely contributing to afternoon headaches.    PLAN:  -Due to excessive daytime fatigue as evidenced by subjective report along with high score on Epworth sleepiness scale and fatigue severity scale, will initiate modafinil 200 mg daily.  His depression is currently stable with PHQ 2: 0.  He was advised to monitor for worsening depression, agitation or anxiety and to notify office if this occurs.  Blood pressure has been stable and advised to continue to monitor BP and heart rate and to notify office if increase level.  Additional information regarding modafinil provided to patient via my chart. -Continue amitriptyline 25 mg nightly and will consider discontinuing in the future once stable on modafinil -He will continue Zoloft and Wellbutrin for depression with ongoing monitoring by PCP -Continue aspirin 325 mg daily  and lipitor  for secondary stroke prevention -F/u with PCP regarding your HTN, HLD, DM and depression management  -Ongoing compliance with CPAP for OSA management -Continue to stay active during the day and maintain a healthy diet -Maintain strict control of hypertension with blood pressure goal below 130/90, diabetes with hemoglobin A1c goal below 6.5% and cholesterol with LDL cholesterol (bad cholesterol) goal below 70 mg/dL. I also advised the patient to eat a healthy diet with plenty of whole grains, cereals, fruits and vegetables, exercise regularly and maintain ideal body weight.  Follow up in 3 months or call earlier if needed    I discussed the assessment and treatment plan with the patient.  The patient was provided an opportunity to ask questions  and all were answered to their satisfaction. The patient agreed with the  plan and verbalized an understanding of the instructions.   I provided 26 minutes of non-face-to-face time during this encounter.    Frann Rider, AGNP-BC  [facility] [address] [place], [place] [address]  Phone [phone] Fax [phone] Note: This document was prepared with digital dictation and possible smart phrase technology. Any transcriptional errors that result from this process are unintentional.

## 2019-07-06 NOTE — Progress Notes (Signed)
I agree with the above plan 

## 2019-07-08 ENCOUNTER — Other Ambulatory Visit: Payer: Self-pay | Admitting: Adult Health

## 2019-07-08 DIAGNOSIS — R5383 Other fatigue: Secondary | ICD-10-CM

## 2019-07-08 DIAGNOSIS — G4733 Obstructive sleep apnea (adult) (pediatric): Secondary | ICD-10-CM

## 2019-07-11 NOTE — Telephone Encounter (Signed)
[facility] Verified LR: 06-12-2019 Qty: 30 Pending appointment: 09-27-2019

## 2019-08-04 ENCOUNTER — Other Ambulatory Visit: Payer: Self-pay | Admitting: Adult Health

## 2019-08-05 ENCOUNTER — Other Ambulatory Visit: Payer: Self-pay | Admitting: Adult Health

## 2019-08-07 ENCOUNTER — Other Ambulatory Visit: Payer: Self-pay | Admitting: Adult Health

## 2019-08-07 DIAGNOSIS — R5383 Other fatigue: Secondary | ICD-10-CM

## 2019-08-07 DIAGNOSIS — G4733 Obstructive sleep apnea (adult) (pediatric): Secondary | ICD-10-CM

## 2019-09-08 ENCOUNTER — Other Ambulatory Visit: Payer: Self-pay | Admitting: Cardiothoracic Surgery

## 2019-09-08 DIAGNOSIS — I712 Thoracic aortic aneurysm, without rupture, unspecified: Secondary | ICD-10-CM

## 2019-09-13 ENCOUNTER — Telehealth: Payer: Self-pay | Admitting: Nurse Practitioner

## 2019-09-13 NOTE — Telephone Encounter (Signed)
Phone call to patient to review instructions for 13 hr prep for CT w/ contrast on 12/17 at 1530. Prescription called into [facility]. Pt aware and verbalized understanding of instructions. Prescription: 0230- 50mg  Prednisone 0830- 50mg  Prednisone 1430- 50mg  Prednisone and 50mg  Benadryl

## 2019-09-24 ENCOUNTER — Other Ambulatory Visit: Payer: Self-pay | Admitting: Adult Health

## 2019-09-24 DIAGNOSIS — R5383 Other fatigue: Secondary | ICD-10-CM

## 2019-09-24 DIAGNOSIS — G4733 Obstructive sleep apnea (adult) (pediatric): Secondary | ICD-10-CM

## 2019-09-27 ENCOUNTER — Ambulatory Visit (INDEPENDENT_AMBULATORY_CARE_PROVIDER_SITE_OTHER): Payer: BC Managed Care – PPO | Admitting: Adult Health

## 2019-09-27 ENCOUNTER — Encounter: Payer: Self-pay | Admitting: Adult Health

## 2019-09-27 ENCOUNTER — Other Ambulatory Visit: Payer: Self-pay

## 2019-09-27 VITALS — BP 161/85 | HR 74 | Temp 97.6°F | Ht 75.0 in | Wt 322.0 lb

## 2019-09-27 DIAGNOSIS — I10 Essential (primary) hypertension: Secondary | ICD-10-CM | POA: Diagnosis not present

## 2019-09-27 DIAGNOSIS — R5383 Other fatigue: Secondary | ICD-10-CM | POA: Diagnosis not present

## 2019-09-27 DIAGNOSIS — G4733 Obstructive sleep apnea (adult) (pediatric): Secondary | ICD-10-CM

## 2019-09-27 DIAGNOSIS — S0990XS Unspecified injury of head, sequela: Secondary | ICD-10-CM

## 2019-09-27 DIAGNOSIS — Z8673 Personal history of transient ischemic attack (TIA), and cerebral infarction without residual deficits: Secondary | ICD-10-CM | POA: Diagnosis not present

## 2019-09-27 DIAGNOSIS — Z9989 Dependence on other enabling machines and devices: Secondary | ICD-10-CM

## 2019-09-27 DIAGNOSIS — F329 Major depressive disorder, single episode, unspecified: Secondary | ICD-10-CM | POA: Diagnosis not present

## 2019-09-27 DIAGNOSIS — E785 Hyperlipidemia, unspecified: Secondary | ICD-10-CM

## 2019-09-27 DIAGNOSIS — E119 Type 2 diabetes mellitus without complications: Secondary | ICD-10-CM

## 2019-09-27 MED ORDER — BUPROPION HCL ER (XL) 300 MG PO TB24: 300.0000 mg | ORAL_TABLET | Freq: Every day | ORAL | 6 refills | Status: DC

## 2019-09-27 NOTE — Progress Notes (Signed)
[facility] [address] [place]. [place] [address] [phone]       OFFICE FOLLOW UP NOTE  [name] Date of Birth:  Mar 23, 1961 Medical Record Number:  [id number]   Reason for visit: Stroke follow up [place] provider: Dr. Leonie Man  CHIEF COMPLAINT:  Chief Complaint  Patient presents with   Follow-up    RM 9, alone. Last seen 07/05/2019. No new neuro sx. Having prostate biopsy soon d/t high PSA numbers and UTI's.     HPI:   Stroke admission 12/16/2017: [name] is a 58 year old male with PMH of DM, HTN, HLD, OSA on CPAP and CAD who presented with acute onset of dizziness, slurred speech and ataxia on 12/16/2017.  CT head reviewed and showed no intracranial abnormality.  MRI brain reviewed and did show an acute infarct in the left pons.  Bilateral carotid ultrasound performed and showed bilateral ICA stenosis of 1-39%.  2D echo performed and showed an EF of 55-60% with moderate LVH. LDL 82 and was recommended to increase Lipitor from 40 mg to 80 mg.  A1c 5.5.  Patient was previously on aspirin 81 mg and was recommended to increase his to aspirin 325 mg daily.  Patient was discharged home in stable condition.  Initial visit  01/27/2018: since discharge, patient has been participating in PT/OT.  He is accompanied at today's appointment with his wife.  He completed OT but continues PT 2 times per week.  He is making great improvement but does continue to exhibit some right hemiplegia.  He does complain about frequent fatigue especially after PT sessions or with increased activity.  He does continue to take aspirin without increase in bruising or bleeding.  Continues to take Lipitor with mild aches but able to tolerate them and these are improving.  Today's blood pressure satisfactory at 135/87.  Patient does have OSA and is compliant with CPAP machine.  He does have complaints of a headache that occurs on a weekly basis that has been occurring for the past 2 years.  He  relates these to stress.  They start in the back of his neck and go up into his head and describes it as pressure.  Does have mild photophobia and phonophobia but denies nausea and vomiting.  Does take Tylenol which does seem to relieve these headaches.  He does continue to take Zoloft which has been helping with depression and anxiety. No new or worsening stroke/TIA symptoms.  02/24/2018 visit: Patient returns today for 48-month follow-up.  He is being reevaluated for possible return to work.  He recently completed physical therapy as he has made great improvement in his strength.  He does still have complaints of increased fatigue after moderate activity where his symptoms such as dizziness or weakness can become more pronounced.  He states he was able to mow the yard which took him approximately 20 minutes and patient had to lay down afterwards due to extreme fatigue.  Blood pressure at today's visit 162/94 but states he has not taken his medications yet and typically 130 over 80s at home.  Continues to be compliant with CPAP for OSA.  Continues to take aspirin without side effects of bleeding or bruising.  Continues to take atorvastatin without side effects of myalgias.  Concern for patient to return to work full-time as he does have a high activity demanding job such as lifting heavy objects or climbing on high ladders.  Concern for if  increase in fatigue does occur, he can  become weaker or have episodes of dizziness.  Recommend patient states to return to work on a part-time basis only at this time.  MRA head and neck performed on 02/03/2018 which did show a normal MRA of neck but MRI of head did show possible arthrosclerosis and left vertebral artery but per report, this could also represent artifact.  All remainder medium to large sized intracranial vessels appeared unremarkable.  03/10/18 visit: Patient returns today for 2-week follow-up for possible return to work and is accompanied by his wife.  It was  recommended that patient return to work part-time at previous visit but with limitations such as not climbing ladders or high elevations or lifting heavy objects.  He continues to feel increased fatigue after activities that prior to the stroke would not make him feel as fatigued.  For example, patient mowed grass this morning with a push mower and feels as though he needed to lay down.  He will start to have difficulty walking, balance issues and a headache with this increased fatigue.  He does only receive approximately 5 to 6 hours of sleep a night due to being restless and "mind going constantly".  Patient does have history of OSA and is compliant with CPAP machine.  Patient does state that he has had increased stress and depression recently due to job stress and being unable to go back to work.  Currently on Zoloft 100 mg.  Patient questioning resources regarding vocational rehab or possibly taking early retirement and would like more information or resources on these topics.  Patient continues to not be safe at this time to return to work full-time or even part-time without restrictions as patient's job consists of climbing on ladders or working at [employer] or lifting heavy objects.  As patient fatigues quickly over smaller jobs/activities, patient would not be safe on ladders or higher elevations due to increase of difficulty walking and balance issues with increased activity and fatigue.  Denies new or worsening stroke/TIA symptoms.  05/03/18 UPDATE: Patient is being seen today for follow-up visit for patient request for short-term disability extension due to continued fatigue.  Patient states that he he has no energy to complete certain tasks especially household work or yard work and this is not like him as he was previously very active person.  He has had increased depression with anxiety and his PCP recently started him on Wellbutrin.  His BP has been increased and at today's appointment 171/92 (the  patient did not take antihypertensives today) but was also elevated at last week's appointment with PCP at 160/84.  Antihypertensives were not adjusted at PCP visit due to thought of possible increased BP due to anxiety/depression.  Patient does not check BP at home as they do not have a working BP cuff.  Also complains of new onset headaches for approximately the past 3 weeks that occur 2 times per week and are localized in the frontal region and back of head/neck area with stiffness in his neck and are accompanied by phonophobia, at times blurring of vision, and are debilitating.  Patient has taken Tylenol 500 mg which "takes the edge off" but also makes him fatigued and was told in the past to limit amount of Tylenol.  Patient does have a history of headaches and has been on Topamax in the past but was unable to tolerate Topamax.  Patient does have a history of OSA and is compliant with CPAP but has not had follow-up appointment for OSA since diagnosed  in 2012.  At this time, patient feels as though he is unable to return to work due to continued excessive fatigue, headaches, and uncontrolled blood pressure denies new or worsening stroke/TIA symptoms.  Update 11/03/2018: Patient is being seen today for 64-month follow-up.  He does still continue to have daytime fatigue and denies any improvement over time but denies worsening.  He does continue to use CPAP for OSA management.  He states that he will have some good days where he is able to do more activities and then some bad days but typically depending on how he is sleeping at night.  He does endorse difficulty sleeping at night over the past few months which has been making his daytime fatigue worse.  He continues to experience occasional headaches that occur approximately 1 time per week and may last for a few hours but typically subside after taking Tylenol.  Continues to be located frontal and occipital portion of his head and at times can experience blurry  vision and photophobia.  Denies nausea/vomiting.  He feels as though his depression has "leveled off" where he feels a slight improvement but does not feel as though he is returned to his "normal self before my stroke".  He does continue to take Zoloft and Wellbutrin which is prescribed by his PCP.  He also continues to experience cognitive deficits since his stroke.  Continues to take aspirin without side effects of bleeding or bruising.  Continues to take atorvastatin without side effects myalgias.  Blood pressure today 134/88.  He is currently in the process of applying for long-term disability.  No further concerns at this time.  Denies new or worsening stroke/TIA symptoms.  Virtual visit update 02/03/2019: Mr. Culverhouse is a 58 year old male with underlying medical history of DM, HTN, HLD, OSA on CPAP, CAD and left pons infarct in 11/2017. He has been followed in this office since his left pons infarct in 11/2017.  He continues to have residual stroke deficits of excessive fatigue, headache and depression.  His fatigue has been so severe that he attempted to return to work but unable to fulfill his duties satisfactorily and ultimately was let go from his position.  He is currently on Social Security disability at this time.  He states his depression has been managed with use of Zoloft and Wellbutrin which has been prescribed by PCP.  After prior visit on 11/03/2018, amitriptyline 25 mg initiated due to ongoing headaches.  He endorses occasional frontal/occipital headaches occurring 2-3 times monthly and is typically associated with increased activity leading to increased fatigue.  He has noticed mild benefit with use of amitriptyline.  He is able to do daytime activities for approximately 1 to 2 hours and then will have to rest.  He does have underlying OSA and has been compliant with CPAP.  He denies having this excessive daytime fatigue prior to his stroke.  He has continued on aspirin 325mg  daily without side  effects of bleeding or bruising.  Continues on atorvastatin without side effects of myalgias.  Blood pressure stable typically ranging 130s/80s.  No further concerns at this time.  Denies new or worsening stroke/TIA symptoms.  Update 07/05/2019: Mr. Campi is being seen today for stroke follow-up.  Residual stroke deficits of excessive daytime fatigue, headache and depression.  Initiated modafinil at prior visit but unfortunately insurance declined therefore initiated armodafinil to assist with daytime fatigue.  After 2-week duration, he did not notice any improvement or benefit with use of armodafinil therefore  transition back to modafinil 200 mg daily.  Continues on modafinil 200 mg daily with slight improvement of fatigue especially in the morning. Fatigue worsens mid afternoon and evening typically or with increased activity.  He does continue to take daily naps.  He continues on amitriptyline for headache management.  Headaches have been stable with 1-2 monthly.  Depression has been mostly stable with use of Zoloft and Wellbutrin which is managed by PCP. Can have intermittent difficulty with depression accompanied by decreased energy, endurance, groggy feeling and decreased appetite which may last for couple days and then resolve.  He endorses ongoing compliance with CPAP for OSA management.  He has not had any recent follow-ups with sleep apnea provider since prior to his stroke.  He continues on aspirin and atorvastatin for secondary stroke prevention without side effects.  Blood pressure stable at 116/70.  No further concerns at this time.  Update 09/27/2019: Mr. Costella is a 58 year old male who is being seen today for stroke follow up.  Excessive daytime fatigue slightly improved with use of modafinil 200 mg daily tolerating without side effects.  Epworth Sleepiness Scale 16.  Continues on amitriptyline 25 mg nightly for headache management tolerating without side effects.  He denies any recent  headaches.  He continues on Wellbutrin 150 mg daily and sertraline 150 mg daily for underlying depression and anxiety.  He continues to experience depression and anxiety along with slight worsening over the past 4 to 6 weeks due to personal and family medical concerns.  He plans on undergoing prostate biopsy next week for continued elevated PSA levels. Ongoing compliance with CPAP for OSA management.  Continues on aspirin and atorvastatin for secondary stroke prevention without side effects.  Blood pressure today elevated at 161/85, asymptomatic, but does monitor at home and typically normal.  No further concerns at this time.       ROS:   Review of Systems  Constitutional: Positive for malaise/fatigue.  HENT: Negative.   Eyes: Negative.   Respiratory: Negative.   Cardiovascular: Negative.   Gastrointestinal: Negative.   Genitourinary: Positive for frequency and urgency.  Musculoskeletal: Negative.   Skin: Negative.   Neurological: Positive for headaches.  Endo/Heme/Allergies: Negative.   Psychiatric/Behavioral: Positive for depression. The patient is nervous/anxious.      PMH:  Past Medical History:  Diagnosis Date   Alcohol abuse    Aortic root dilatation ([facility])    a. echo 4/12: EF 55-60%, mod to marked dilated ascending aortic root;   b. MRA 4/12: Aortic root 5.5 cm, dilation of left and right coronary cusps, trileaflet aortic valve // Echo 2/19: Moderate LVH, EF 55-60, normal wall motion, grade 1 diastolic dysfunction, mild AI, mildly dilated aortic root, moderate to severe LAE (aortic root 40 mm; ascending aorta 39 mm)    Arthritis    Asthma    ast attack in early 20's   CAD (coronary artery disease)    Chest pain    Myoview 4/12: EF 62%, no ischemia or scar   CHF (congestive heart failure) (HCC)    Chronic pain    back and hands   Constipation    COPD (chronic obstructive pulmonary disease) (HCC)    Depression    Diabetes mellitus    type 2   Difficulty  speaking    Drug use    Enlarged prostate    Fatigue    GERD (gastroesophageal reflux disease)    modifies with diet   Hx of echocardiogram    Echo (2/16):  Mod LVH, EF 55-60%, mild to mod AI, mod LAE, mild RAE, Aortic root 38 mm, Asc aorta 43 mm   Hyperlipidemia    Hypertension    Joint pain    Knee pain, bilateral    Leg pain    ABIs 3/19: Normal (R 1.3; L 1.28)   OSA (obstructive sleep apnea) 2006   .  Wears at times   Osteoarthritis    Palpitations    Stroke [facility])     PSH:  Past Surgical History:  Procedure Laterality Date   APPENDECTOMY  12/2016   CHOLECYSTECTOMY N/A 11/24/2018   Procedure: LAPAROSCOPIC CHOLECYSTECTOMY;  Surgeon: Stark Klein, MD;  Location: [facility];  Service: General;  Laterality: N/A;   Colonscopy     CORONARY ARTERY BYPASS GRAFT  05/07/2012   Procedure: CORONARY ARTERY BYPASS GRAFTING (CABG);  Surgeon: Grace Isaac, MD;  Location: [facility];  Service: Open Heart Surgery;  Laterality: N/A;   KNEE ARTHROSCOPY Left 2010   Dr. Rosamaria Lints   LAPAROSCOPIC APPENDECTOMY N/A 01/06/2017   Procedure: APPENDECTOMY LAPAROSCOPIC;  Surgeon: Ralene Ok, MD;  Location: [facility];  Service: General;  Laterality: N/A;   Retactment of fingers     as a child , sewed with sewing machine- Index and middle finger   UPPER GASTROINTESTINAL ENDOSCOPY      Social History:  Social History   Socioeconomic History   Marital status: Married    Spouse name: Khalin Mantione   Number of children: 0   Years of education: 12th grade   Highest education level: Not on file  Occupational History   Occupation: Fish farm manager for [employer]    Employer: [employer]  Social Designer, fashion/clothing strain: Not on file   Food insecurity    Worry: Not on file    Inability: Not on file   Transportation needs    Medical: Not on file    Non-medical: Not on file  Tobacco Use   Smoking status: Former Smoker    Packs/day: 1.00     Years: 13.00    Pack years: 13.00    Types: Cigarettes    Quit date: 01/23/1988    Years since quitting: 31.6   Smokeless tobacco: Never Used  Substance and Sexual Activity   Alcohol use: Yes    Comment: quit 1989   Drug use: No    Comment: previous cocaine and marijuana use, quit 1988   Sexual activity: Not Currently    Partners: Female  Lifestyle   Physical activity    Days per week: Not on file    Minutes per session: Not on file   Stress: Not on file  Relationships   Social connections    Talks on phone: Not on file    Gets together: Not on file    Attends religious service: Not on file    Active member of club or organization: Not on file    Attends meetings of clubs or organizations: Not on file    Relationship status: Not on file   Intimate partner violence    Fear of current or ex partner: Not on file    Emotionally abused: Not on file    Physically abused: Not on file    Forced sexual activity: Not on file  Other Topics Concern   Not on file  Social History Narrative   Married, lives in [place]. Therapist, occupational for [employer].    Pt is adopted. Unsure of his family  medical history.    Family History:  Family History  Adopted: Yes  Family history unknown: Yes    Medications:   Current Outpatient Medications on File Prior to Visit  Medication Sig Dispense Refill   acetaminophen (TYLENOL) 500 MG tablet TAKE 1-2 TABS EVERY 6 HOURS AS NEEDED FOR PAIN.  Do not take more than 4000 mg of Tylenol per day it can harm your liver. 30 tablet 0   amitriptyline (ELAVIL) 25 MG tablet TAKE 1 TABLET BY MOUTH  AT BEDTIME 30 tablet 5   aspirin 325 MG EC tablet Take 1 tablet (325 mg total) by mouth at bedtime. 90 tablet 3   atorvastatin (LIPITOR) 80 MG tablet Take 1 tablet (80 mg total) by mouth daily at 6 PM. 90 tablet 3   carvedilol (COREG) 12.5 MG tablet TAKE ONE TABLET BY MOUTH TWO TIMES A DAY (Patient taking differently: Take 12.5 mg by mouth 2  (two) times daily. ) 180 tablet 2   felodipine (PLENDIL) 10 MG 24 hr tablet TAKE ONE TABLET BY MOUTH DAILY (Patient taking differently: Take 10 mg by mouth daily. ) 90 tablet 2   furosemide (LASIX) 40 MG tablet TAKE ONE TABLET BY MOUTH DAILY (Patient taking differently: Take 40 mg by mouth daily. ) 90 tablet 2   lidocaine (XYLOCAINE) 5 % ointment Apply 1 application topically as needed. Daily for leg pain 50 g 3   lisinopril (PRINIVIL,ZESTRIL) 20 MG tablet TAKE ONE TABLET BY MOUTH DAILY (Patient taking differently: Take 20 mg by mouth daily. ) 90 tablet 2   loratadine (CLARITIN) 10 MG tablet Take 10 mg by mouth at bedtime.      Menthol-Methyl Salicylate (THERA-GESIC) 0.5-15 % CREA Apply 1 application topically daily as needed (pain).     metFORMIN (GLUCOPHAGE) 1000 MG tablet Take 1 tablet (1,000 mg total) by mouth 2 (two) times daily with a meal. 180 tablet 3   mirabegron ER (MYRBETRIQ) 50 MG TB24 tablet Take 50 mg by mouth at bedtime.      modafinil (PROVIGIL) 200 MG tablet TAKE ONE TABLET BY MOUTH DAILY 30 tablet 5   oxyCODONE (OXY IR/ROXICODONE) 5 MG immediate release tablet Take 1 tablet (5 mg total) by mouth every 4 (four) hours as needed for moderate pain or severe pain. 15 tablet 0   sertraline (ZOLOFT) 100 MG tablet Take 1.5 tablets (150 mg total) by mouth daily. 135 tablet 3   tamsulosin (FLOMAX) 0.4 MG CAPS capsule Take 2 capsules (0.8 mg total) by mouth daily after supper. 180 capsule 3   Trolamine Salicylate (ASPERCREME EX) Apply 1 application topically daily as needed (pain).     No current facility-administered medications on file prior to visit.     Allergies:   Allergies  Allergen Reactions   Ibuprofen Other (See Comments)    TOLD NOT TO TAKE-PER CARDS   Iodinated Diagnostic Agents Hives    Hives started 4 days after cta chest was performed, minimal relief w/ benadryl x 3 days, uncertain if reaction was actually iv contrast or other allergen,allergy tests to  follow.wife will make [place] aware of results//a.[email or web address]   Quinolones Other (See Comments)     Physical Exam  Vitals:   09/27/19 0846  BP: (!) 161/85  Pulse: 74  Temp: 97.6 F (36.4 C)  SpO2: 98%  Weight: (!) 322 lb (146.1 kg)  Height: 6\' 3"  (1.905 m)   Body mass index is 40.25 kg/m. No exam data present  Depression screen [facility] 2/9 09/27/2019  Decreased  Interest 2  Down, Depressed, Hopeless 1  PHQ - 2 Score 3  Altered sleeping 2  Tired, decreased energy 2  Change in appetite 1  Feeling bad or failure about yourself  1  Trouble concentrating 3  Moving slowly or fidgety/restless 2  Suicidal thoughts 0  PHQ-9 Score 14  Difficult doing work/chores Very difficult  Some recent data might be hidden   GAD 7 : Generalized Anxiety Score 09/27/2019 08/05/2017  Nervous, Anxious, on Edge 2 3  Control/stop worrying 1 1  Worry too much - different things 2 1  Trouble relaxing 1 3  Restless 1 1  Easily annoyed or irritable 2 3  Afraid - awful might happen 1 1  Total GAD 7 Score 10 13  Anxiety Difficulty Somewhat difficult Somewhat difficult     General: Obese pleasant middle-age male, seated, in no evident distress but flat affect Head: head normocephalic and atraumatic.   Neck: supple with no carotid or supraclavicular bruits Cardiovascular: regular rate and rhythm, no murmurs Musculoskeletal: no deformity Skin:  no rash/petichiae Vascular:  Normal pulses all extremities  Neurologic Exam Mental Status: Awake and fully alert.  Oriented to place and time. Recent and remote memory intact. Attention span, concentration and fund of knowledge appropriate. Mood and affect flat. Cranial Nerves: Pupils equal, briskly reactive to light. Extraocular movements full without nystagmus. Visual fields full to confrontation. Hearing intact. Facial sensation intact.   Motor: Full strength in all tested extremities Sensory.: intact to touch , pinprick , position and vibratory sensation.    Coordination: Rapid alternating movements normal in all extremities. Finger-to-nose and heel-to-shin performed accurately bilaterally.   Gait and Station: Arises from chair without difficulty. Stance is normal. Gait demonstrates normal stride and balance.  Able to heel, toe and tandem walk with mild difficulty.  Reflexes: 1+ and symmetric. Toes downgoing.    Diagnostic Data (Labs, Imaging, Testing)  MRA HEAD WO CONTRAST 02/03/18 IMPRESSION:  MRA head (without) demonstrating: - Hypoplastic left vertebral artery with focal area of decreased flow signal in the distal segment.  While this can be seen with focal atherosclerosis, this more likely represents area of technical artifact, especially when correlated with normal-appearing flow signal on MRA neck from same day. - Remainder of medium to large sized intracranial vessels appear unremarkable.  MRA NECK WO CONTRAST 02/03/18 IMPRESSION:  Normal MRA neck (with and without).   Ct Head Wo Contrast Result Date: 12/16/2017  IMPRESSION: 1. No evidence of acute intracranial abnormality. 2. Mild chronic small vessel ischemic disease.   [name] Result Date: 12/16/2017 IMPRESSION: 1 cm acute infarct left pons Mild to moderate chronic microvascular ischemic changes in the cerebral white matter bilaterally. Electronically Signed      Echocardiogram:      12/17/2017                                     Study Conclusions - Left ventricle: The cavity size was normal. Wall thickness was increased in a pattern of moderate LVH. Systolic function was normal. The estimated ejection fraction was in the range of 55% to 60%. Wall motion was normal; there were no regional wall motion abnormalities. Doppler parameters are consistent with abnormal left ventricular relaxation (grade 1 diastolic dysfunction). - Aortic valve: There was mild regurgitation. Valve area (VTI): 3.48 cm^2. Valve area (Vmax): 3.49 cm^2. Valve area (Vmean):  3.31 cm^2. - Aortic root: The aortic  root was mildly dilated. - Left atrium: The atrium was moderately to severely dilated.  B/L Carotid U/S:   12/17/2017                                         Final Interpretation: Right Carotid: Velocities in the right ICA are consistent with a 1-39% stenosis. Left Carotid: Velocities in the left ICA are consistent with a 1-39% stenosis. Vertebrals: Both vertebral arteries were patent with antegrade flow. Subclavians: Normal flow hemodynamics were seen in bilateral subclavian arteries.    ASSESSMENT: Claxton Lyu is a 58 y.o. 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 Vascular risk factors include HTN, HLD, DM, OSA on CPAP and CAD.  Residual deficits of post stroke depression/anxiety along with ongoing concerns of daytime fatigue   PLAN: Due to ongoing depression/anxiety likely greatly contributing to fatigue, recommend increasing Wellbutrin XR to 300 mg daily and continue sertraline 150 mg daily, amitriptyline 25 mg nightly and modafinil 200 mg daily -Due to report of limited benefit with modafinil, will likely discontinue once stable on increase Wellbutrin dose -Referral placed at prior visit for neuropsych evaluation at prior visit but unfortunately has not been evaluated.  Referral replaced and discussion with patient as he will greatly benefit from neuropsych evaluation due to ongoing difficulty with depression and anxiety post stroke -Advised patient to schedule follow-up visit with sleep apnea provider to ensure adequate OSA management with CPAP use -Continue aspirin 325 mg daily  and lipitor  for secondary stroke prevention -F/u with PCP regarding your HTN, HLD, DM and depression management  -Recommended increasing activity during the day along with maintaining a healthy diet -Maintain strict control of hypertension with blood pressure goal below 130/90, diabetes with hemoglobin A1c goal  below 6.5% and cholesterol with LDL cholesterol (bad cholesterol) goal below 70 mg/dL. I also advised the patient to eat a healthy diet with plenty of whole grains, cereals, fruits and vegetables, exercise regularly and maintain ideal body weight.  Follow up in 6 months or call earlier if needed  Greater than 50% time during this 25 minute consultation visit was spent on counseling and discussion regarding ongoing difficulties with daytime fatigue and depression, coordination of care about HLD, HTN, OSA and DM for secondary stroke prevention and answered all questions to patient satisfaction  Frann Rider, [facility]  [facility] [address] [place], [place] [address]  Phone [phone] Fax [phone]

## 2019-09-27 NOTE — Patient Instructions (Signed)
Increase Wellbutrin dose to 300 mg daily  Continue Zoloft 150 mg daily, and amitriptyline 25 mg nightly  May consider discontinuing modafinil when stable on increase Wellbutrin dose  Referral placed to neuropsychology due to ongoing depression post stroke which could be contributing to fatigue  Continue aspirin 325 mg daily  and Lipitor for secondary stroke prevention  Continue to follow up with PCP regarding cholesterol, blood pressure and diabetes management   Continue to monitor blood pressure at home  Maintain strict control of hypertension with blood pressure goal below 130/90, diabetes with hemoglobin A1c goal below 6.5% and cholesterol with LDL cholesterol (bad cholesterol) goal below 70 mg/dL. I also advised the patient to eat a healthy diet with plenty of whole grains, cereals, fruits and vegetables, exercise regularly and maintain ideal body weight.  Followup in the future with me in 6 months or call earlier if needed       Thank you for coming to see [place] at [facility]. I hope we have been able to provide you high quality care today.  You may receive a patient satisfaction survey over the next few weeks. We would appreciate your feedback and comments so that we may continue to improve ourselves and the health of our patients.

## 2019-09-28 NOTE — Progress Notes (Signed)
I agree with the above plan 

## 2019-10-13 ENCOUNTER — Other Ambulatory Visit: Payer: BLUE CROSS/BLUE SHIELD

## 2019-10-13 ENCOUNTER — Ambulatory Visit: Payer: BLUE CROSS/BLUE SHIELD | Admitting: Cardiothoracic Surgery

## 2019-10-17 ENCOUNTER — Telehealth: Payer: Self-pay

## 2019-10-17 ENCOUNTER — Ambulatory Visit
Admission: RE | Admit: 2019-10-17 | Discharge: 2019-10-17 | Disposition: A | Discharge status: Home / Self Care | Payer: BC Managed Care – PPO | Source: Ambulatory Visit | Attending: Cardiothoracic Surgery | Admitting: Cardiothoracic Surgery

## 2019-10-17 DIAGNOSIS — I712 Thoracic aortic aneurysm, without rupture, unspecified: Secondary | ICD-10-CM

## 2019-10-17 NOTE — Telephone Encounter (Signed)
Phone call to patient to review instructions for 13 hr prep for CT w/ contrast on 10/18/2019  at 11am (patients appointment time in Epic is 120pm, but per Jerrye Beavers in [place] she will scan him at 41). Prescription called into [facility]. Pt aware and verbalized understanding of instructions. Prescription: 10/17/19 10pm- 50mg  Prednisone 10/18/19 4am- 50mg  Prednisone 10/18/19 10am - 50mg  Prednisone and 50mg  Benadryl

## 2019-10-18 ENCOUNTER — Inpatient Hospital Stay: Admission: RE | Admit: 2019-10-18 | Payer: BC Managed Care – PPO | Source: Ambulatory Visit

## 2019-10-18 ENCOUNTER — Other Ambulatory Visit: Payer: BC Managed Care – PPO

## 2019-10-18 ENCOUNTER — Ambulatory Visit
Admission: RE | Admit: 2019-10-18 | Discharge: 2019-10-18 | Disposition: A | Discharge status: Home / Self Care | Payer: BC Managed Care – PPO | Source: Ambulatory Visit | Attending: Cardiothoracic Surgery | Admitting: Cardiothoracic Surgery

## 2019-10-18 MED ORDER — IOPAMIDOL (ISOVUE-370) INJECTION 76%
75.0000 mL | Freq: Once | INTRAVENOUS | Status: AC | PRN
  Administered 2019-10-18: 75 mL via INTRAVENOUS

## 2019-10-20 ENCOUNTER — Ambulatory Visit: Payer: Self-pay | Admitting: Cardiothoracic Surgery

## 2019-10-20 ENCOUNTER — Other Ambulatory Visit: Payer: Self-pay

## 2019-10-20 ENCOUNTER — Ambulatory Visit (INDEPENDENT_AMBULATORY_CARE_PROVIDER_SITE_OTHER): Payer: BC Managed Care – PPO | Admitting: Cardiothoracic Surgery

## 2019-10-20 VITALS — BP 152/93 | HR 82 | Temp 97.7°F | Resp 20 | Ht 75.0 in | Wt 312.0 lb

## 2019-10-20 DIAGNOSIS — I77810 Thoracic aortic ectasia: Secondary | ICD-10-CM | POA: Diagnosis not present

## 2019-10-20 NOTE — Progress Notes (Signed)
[address]       [place] [address]             [phone]                                        [facility] Medical Record [id number] Date of Birth: Feb 18, 1961  Dr  Keturah Barre Jessica Priest, PA Primary Cardiology:Traci Radford Pax, MD Fransico Him, MD   Chief Complaint:   PostOp Follow Up Visit 05/07/2012  OPERATIVE REPORT  PREOPERATIVE DIAGNOSIS: Dilated aortic root.  POSTOPERATIVE DIAGNOSIS: Dilated aortic root.  SURGICAL PROCEDURE: Valve-sparing aortic root replacement and coronary  artery bypass grafting x1 with second reverse saphenous vein graft to  the distal right coronary artery with right thigh endo vein harvesting.    History of Present Illness:       Patient notes that since he was last seen he was admitted January 2020 with acute cholecystitis requiring cholecystectomy.  The year prior he was admitted to [facility] for 3 days in February 2019   with diagnosis of stroke.  He notes that the symptoms primarily involved his right leg and arm but resolved fairly quickly.  Though he has not been able to return to work since that time.  He did have several months of rehab.   He notes since recovering from his gallbladder surgery he has been doing reasonably well without signs or symptoms of congestive heart failure or angina.   Social History   Tobacco Use  Smoking Status Former Smoker  . Packs/day: 1.00  . Years: 13.00  . Pack years: 13.00  . Types: Cigarettes  . Quit date: 01/23/1988  . Years since quitting: 31.7  Smokeless Tobacco Never Used       Allergies  Allergen Reactions  . Ibuprofen Other (See Comments)    TOLD NOT TO TAKE-PER CARDS  . Iodinated Diagnostic Agents Hives    Hives started 4 days after cta chest was performed, minimal relief w/ benadryl x 3 days, uncertain if reaction was actually iv contrast or other allergen,allergy tests to follow.wife will make [place] aware of results//a.calhoun  .  Quinolones Other (See Comments)    Current Outpatient Medications  Medication Sig Dispense Refill  . acetaminophen (TYLENOL) 500 MG tablet TAKE 1-2 TABS EVERY 6 HOURS AS NEEDED FOR PAIN.  Do not take more than 4000 mg of Tylenol per day it can harm your liver. 30 tablet 0  . amitriptyline (ELAVIL) 25 MG tablet TAKE 1 TABLET BY MOUTH  AT BEDTIME 30 tablet 5  . aspirin 325 MG EC tablet Take 1 tablet (325 mg total) by mouth at bedtime. 90 tablet 3  . atorvastatin (LIPITOR) 80 MG tablet Take 1 tablet (80 mg total) by mouth daily at 6 PM. 90 tablet 3  . buPROPion (WELLBUTRIN XL) 300 MG 24 hr tablet Take 1 tablet (300 mg total) by mouth daily. 30 tablet 6  . carvedilol (COREG) 12.5 MG tablet TAKE ONE TABLET BY MOUTH TWO TIMES A DAY (Patient taking differently: Take 12.5 mg by mouth 2 (two) times daily. ) 180 tablet 2  . felodipine (PLENDIL) 10 MG 24 hr tablet TAKE ONE TABLET BY MOUTH DAILY (Patient taking differently: Take 10 mg by mouth daily. ) 90 tablet 2  . furosemide (LASIX) 40 MG tablet TAKE ONE TABLET BY MOUTH DAILY (Patient taking differently:  Take 40 mg by mouth daily. ) 90 tablet 2  . lidocaine (XYLOCAINE) 5 % ointment Apply 1 application topically as needed. Daily for leg pain 50 g 3  . lisinopril (PRINIVIL,ZESTRIL) 20 MG tablet TAKE ONE TABLET BY MOUTH DAILY (Patient taking differently: Take 20 mg by mouth daily. ) 90 tablet 2  . loratadine (CLARITIN) 10 MG tablet Take 10 mg by mouth at bedtime.     . Menthol-Methyl Salicylate (THERA-GESIC) 0.5-15 % CREA Apply 1 application topically daily as needed (pain).    . metFORMIN (GLUCOPHAGE) 1000 MG tablet Take 1 tablet (1,000 mg total) by mouth 2 (two) times daily with a meal. 180 tablet 3  . mirabegron ER (MYRBETRIQ) 50 MG TB24 tablet Take 50 mg by mouth at bedtime.     . modafinil (PROVIGIL) 200 MG tablet TAKE ONE TABLET BY MOUTH DAILY 30 tablet 5  . sertraline (ZOLOFT) 100 MG tablet Take 1.5 tablets (150 mg total) by mouth daily. 135 tablet 3    . tamsulosin (FLOMAX) 0.4 MG CAPS capsule Take 2 capsules (0.8 mg total) by mouth daily after supper. 180 capsule 3  . Trolamine Salicylate (ASPERCREME EX) Apply 1 application topically daily as needed (pain).    Marland Kitchen oxyCODONE (OXY IR/ROXICODONE) 5 MG immediate release tablet Take 1 tablet (5 mg total) by mouth every 4 (four) hours as needed for moderate pain or severe pain. (Patient not taking: Reported on 10/20/2019) 15 tablet 0   No current facility-administered medications for this visit.       Physical Exam: BP (!) 152/93 (BP Location: Right Arm, Patient Position: Sitting, Cuff Size: Normal)   Pulse 82   Temp 97.7 F (36.5 C) (Skin)   Resp 20   Ht 6\' 3"  (1.905 m)   Wt (!) 141.5 kg   SpO2 93% Comment: RA  BMI 39.00 kg/m  General appearance: alert, cooperative, appears stated age and no distress Head: Normocephalic, without obvious abnormality, atraumatic Neck: no adenopathy, no carotid bruit, no JVD, supple, symmetrical, trachea midline and thyroid not enlarged, symmetric, no tenderness/mass/nodules Lymph nodes: Cervical, supraclavicular, and axillary nodes normal. Resp: clear to auscultation bilaterally Cardio: regular rate and rhythm, S1, S2 normal, no murmur, click, rub or gallop and specificiall no murmur of AI GI: soft, non-tender; bowel sounds normal; no masses,  no organomegaly Extremities: extremities normal, atraumatic, no cyanosis or edema and Homans sign is negative, no sign of DVT Neurologic: Grossly normal Patients sternum is stable and healed well. Un changed is incisional keloid formation along incision -we have discussed this in the past but this point he was not interested in any further wound revision  Diagnostic Studies & Laboratory data:          Recent Radiology Findings: CT ANGIO CHEST AORTA W/CM &/OR WO/CM  Result Date: 10/18/2019 CLINICAL DATA:  Thoracic aortic prominence EXAM: CT ANGIOGRAPHY CHEST WITH CONTRAST TECHNIQUE: Multidetector CT imaging  of the chest was performed using the standard protocol during bolus administration of intravenous contrast. Multiplanar CT image reconstructions and MIPs were obtained to evaluate the vascular anatomy. CONTRAST:  49mL ISOVUE-370 IOPAMIDOL (ISOVUE-370) INJECTION 76% patient received premedication due to history of allergic reaction to iodinated contrast. No adverse sequela with this study. COMPARISON:  October 14, 2018 FINDINGS: Cardiovascular: There is again noted previous coronary artery bypass grafting and aortic root repair. The ascending thoracic aortic diameter measures 4.2 x 4.1 cm, stable from prior study. Coronary artery origins again appears somewhat ectatic, stable. There are foci of native coronary  artery calcification. Visualized great vessels appear normal. No evident dissection. Measured diameter at the sinuses of Valsalva measures 3.7 cm. There is no appreciable pulmonary embolus. No no pericardial effusion or pericardial fluid evident. Mediastinum/Nodes: Visualized thyroid appears unremarkable. There is no appreciable thoracic adenopathy. Calcification in a left hilar lymph node is stable and indicative of prior granulomatous disease. No esophageal lesions are evident. Lungs/Pleura: A calcified granuloma in the posterior left lung base is stable. There is no edema or consolidation. No pleural effusions are evident. Upper Abdomen: There are multiple calcified splenic granulomas. Gallbladder is absent. Visualized upper abdominal structures appear unremarkable. Musculoskeletal: Patient is status post median sternotomy. There is degenerative change in the thoracic spine. There are no blastic or lytic bone lesions. No evident chest wall lesions. Review of the MIP images confirms the above findings. IMPRESSION: 1. Postoperative change in the aortic root region appear stable. Maximum diameter of the ascending thoracic aorta measures 4.2 x 4.1 cm, essentially stable. No evident dissection. Status post  coronary artery bypass grafting. Multiple foci of native coronary artery calcification. 2.  No demonstrable pulmonary embolus. 3. Evidence of prior granulomatous disease. No lung edema or consolidation. 4.  No evident adenopathy. 5.  Gallbladder absent. Electronically Signed   By: Lowella Grip III M.D.   On: 10/18/2019 12:06   I have independently reviewed the above radiology studies  and reviewed the findings with the patient.   Ct Angio Chest Aorta W/cm &/or Wo/cm  10/15/2015  CLINICAL DATA:  Status post aortic root replacement and coronary bypass EXAM: CT ANGIOGRAPHY CHEST WITH CONTRAST TECHNIQUE: Multidetector CT imaging of the chest was performed using the standard protocol during bolus administration of intravenous contrast. Multiplanar CT image reconstructions and MIPs were obtained to evaluate the vascular anatomy. CONTRAST:  75 cc Isovue 370 COMPARISON:  06/17/2013 FINDINGS: Mediastinum/Lymph Nodes: Patient is status post median sternotomy. Prior coronary bypass and aortic root repair noted. No aneurysm, dissection, mediastinal hemorrhage, or hematoma. Stable ectasia of the coronary origins, as before. Visualized central pulmonary arteries appear patent. No significant proximal filling defect or pulmonary embolus demonstrated. Native coronary atherosclerosis noted. Normal heart size. No pericardial or pleural effusion. No adenopathy. Lungs/Pleura: Lungs remain clear. No focal pneumonia, collapse or consolidation. Stable calcified left lower lobe granuloma and left hilar lymph node. No interstitial process or edema. No pneumothorax. Trachea and central airways are patent. Upper abdomen: Splenic punctate calcified granulomas evident. No acute upper abdominal finding. Colonic diverticulosis noted. Musculoskeletal: Minor degenerative endplate changes of the thoracic spine. No compression fracture. Prior median sternotomy. No acute osseous finding. Review of the MIP images confirms the above  findings. IMPRESSION: Stable postoperative changes of the thoracic aorta. No acute intra thoracic process or interval change. Remote granulomatous disease. Electronically Signed   By: Jerilynn Mages.  Shick M.D.   On: 10/15/2015 10:03   Ct Angio Chest Aorta W/cm &/or Wo/cm  06/17/2013   *RADIOLOGY REPORT*  Clinical Data: Follow-up status post aortic root replacement  CT ANGIOGRAPHY CHEST  Technique:  Multidetector CT imaging of the chest using the standard protocol during bolus administration of intravenous contrast. Multiplanar reconstructed images including MIPs were obtained and reviewed to evaluate the vascular anatomy.  Contrast: 49mL OMNIPAQUE IOHEXOL 350 MG/ML SOLN  Comparison: Most recent prior CTA of the chest 09/25/2011; most recent prior chest x-ray 10/07/2012  Findings:  Mediastinum: Unremarkable CT appearance of the thyroid gland.  No suspicious mediastinal or hilar adenopathy.  No soft tissue mediastinal mass.  The thoracic esophagus is unremarkable.  Heart/Vascular:  Status post median sternotomy with evidence of valve sparing tube graft replacement of the ascending aorta and the aortic to RCA saphenous vein graft.  The vein graft opacifies with contrast material.  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  This is unchanged compared to prior. The aortic anastomosis is intact.  No evidence of periaortic fluid, or hematoma.  The heart is within normal limits for size.  No pericardial effusion.  Atherosclerotic vascular calcifications noted in the left anterior descending coronary artery.  Lungs/Pleura: Stable calcified granuloma in the inferior left lower lobe.  The lungs are otherwise clear.  Upper Abdomen: Scattered punctate calcifications throughout the spleen consistent with the sequela of remote granulomatous disease. Otherwise, visualization of the upper abdomen is unremarkable.  Bones: No acute fracture or aggressive appearing lytic or blastic osseous lesion.  Healed  median sternotomy.  Sternotomy wires are intact.  IMPRESSION:  1. Compared to the prior chest CTA from November 2012 there are interval surgical changes of repair of the ascending aorta with a straight tube graft as well as single vessel CABG to the right coronary artery.  No evidence of complication including recurrent aneurysmal dilatation, dissection, leak or mediastinal hematoma. The visualized portion of the aortic to RCA saphenous vein graft opacifies with contrast material.  2.  Sequela of remote granulomatous disease.   Original Report Authenticated By: Jacqulynn Cadet, M.D.    Result status: Final result                              *[facility]*                         [address], [place] [address]                            [phone]  ------------------------------------------------------------------- Transthoracic Echocardiography  Patient:    Fredrich, Heisser MR #:       [id number] Study Date: 12/17/2017 Gender:     M Age:        51 Height:     182.9 cm Weight:     133.3 kg BSA:        2.66 m^2 Pt. Status: Room:       Gholson, MD [place], Rachal A  Joan Mayans, Rachal A  REFERRING    Derrill Kay A  SONOGRAPHER  [facility]  ATTENDING    Irene Pap N  cc:  ------------------------------------------------------------------- LV EF: 55% -   60%  ------------------------------------------------------------------- Indications:      TIA 435.9.  ------------------------------------------------------------------- History:   PMH:  Aortic root diameter  Congestive heart failure. Risk factors:  Hypertension. Diabetes mellitus. Dyslipidemia.  ------------------------------------------------------------------- Study Conclusions  - Left ventricle: The cavity size was normal. Wall thickness was   increased in a pattern  of moderate LVH. Systolic function was   normal. The estimated ejection fraction was in the range of 55%   to 60%. Wall motion was normal;  there were no regional wall   motion abnormalities. Doppler parameters are consistent with   abnormal left ventricular relaxation (grade 1 diastolic   dysfunction). - Aortic valve: There was mild regurgitation. Valve area (VTI):   3.48 cm^2. Valve area (Vmax): 3.49 cm^2. Valve area (Vmean): 3.31   cm^2. - Aortic root: The aortic root was mildly dilated. - Left atrium: The atrium was moderately to severely dilated.  ------------------------------------------------------------------- Study data:  Comparison was made to the study of 12/24/2016.  Study status:  Routine.  Procedure:  The patient reported no pain pre or post test. Transthoracic echocardiography. Image quality was adequate.  Study completion:  There were no complications. Transthoracic echocardiography.  M-mode, complete 2D, spectral Doppler, and color Doppler.  Birthdate:  Patient birthdate: 23-Feb-1961.  Age:  Patient is 58 yr old.  Sex:  Gender: male. BMI: 39.8 kg/m^2.  Blood pressure:     134/78  Patient status: Inpatient.  Study date:  Study date: 12/17/2017. Study time: 08:50 AM.  Location:  Echo laboratory.  -------------------------------------------------------------------  ------------------------------------------------------------------- Left ventricle:  The cavity size was normal. Wall thickness was increased in a pattern of moderate LVH. Systolic function was normal. The estimated ejection fraction was in the range of 55% to 60%. Wall motion was normal; there were no regional wall motion abnormalities. Doppler parameters are consistent with abnormal left ventricular relaxation (grade 1 diastolic dysfunction).  ------------------------------------------------------------------- Aortic valve:   Trileaflet; normal thickness leaflets. Mobility was not restricted.   Doppler:  Transvalvular velocity was within the normal range. There was no stenosis. There was mild regurgitation.   VTI ratio of LVOT to aortic valve: 0.71. Valve area (VTI): 3.48 cm^2. Indexed valve area (VTI): 1.31 cm^2/m^2. Peak velocity ratio of LVOT to aortic valve: 0.71. Valve area (Vmax): 3.49 cm^2. Indexed valve area (Vmax): 1.31 cm^2/m^2. Mean velocity ratio of LVOT to aortic valve: 0.67. Valve area (Vmean): 3.31 cm^2. Indexed valve area (Vmean): 1.24 cm^2/m^2.    Mean gradient (S): 6 mm Hg. Peak gradient (S): 12 mm Hg.  ------------------------------------------------------------------- Aorta:  Aortic root: The aortic root was mildly dilated.  ------------------------------------------------------------------- Mitral valve:   Structurally normal valve.   Mobility was not restricted.  Doppler:  Transvalvular velocity was within the normal range. There was no evidence for stenosis. There was no regurgitation.    Peak gradient (D): 2 mm Hg.  ------------------------------------------------------------------- Left atrium:  The atrium was moderately to severely dilated.   ------------------------------------------------------------------- Right ventricle:  The cavity size was normal. Wall thickness was normal. Systolic function was normal.  ------------------------------------------------------------------- Pulmonic valve:    The valve appears to be grossly normal. Doppler:  Transvalvular velocity was within the normal range. There was no evidence for stenosis. There was mild regurgitation.  ------------------------------------------------------------------- Tricuspid valve:   Structurally normal valve.    Doppler: Transvalvular velocity was within the normal range. There was no regurgitation.  ------------------------------------------------------------------- Right atrium:  The atrium was normal in  size.  ------------------------------------------------------------------- Pericardium:  There was no pericardial effusion.  ------------------------------------------------------------------- Systemic veins: Inferior vena cava: The vessel was normal in size.  ------------------------------------------------------------------- Measurements   Left ventricle                           Value          Reference  LV ID, ED, PLAX chordal          (H)     52.2  mm       43 -  52  LV ID, ES, PLAX chordal                  37.1  mm       23 - 38  LV fx shortening, PLAX chordal           29    %        >=29  LV PW thickness, ED                      13.8  mm       ----------  IVS/LV PW ratio, ED                      0.97           <=1.3  Stroke volume, 2D                        132   ml       ----------  Stroke volume/bsa, 2D                    50    ml/m^2   ----------  LV e&', lateral                           6.25  cm/s     ----------  LV E/e&', lateral                         11.73          ----------  LV e&', medial                            5.46  cm/s     ----------  LV E/e&', medial                          13.42          ----------  LV e&', average                           5.86  cm/s     ----------  LV E/e&', average                         12.52          ----------    Ventricular septum                       Value          Reference  IVS thickness, ED                        13.4  mm       ----------    LVOT                                     Value          Reference  LVOT ID, S  25    mm       ----------  LVOT area                                4.91  cm^2     ----------  LVOT peak velocity, S                    121   cm/s     ----------  LVOT mean velocity, S                    78.1  cm/s     ----------  LVOT VTI, S                              26.9  cm       ----------  LVOT peak gradient, S                    6     mm Hg    ----------    Aortic  valve                             Value          Reference  Aortic valve peak velocity, S            170   cm/s     ----------  Aortic valve mean velocity, S            116   cm/s     ----------  Aortic valve VTI, S                      37.9  cm       ----------  Aortic mean gradient, S                  6     mm Hg    ----------  Aortic peak gradient, S                  12    mm Hg    ----------  VTI ratio, LVOT/AV                       0.71           ----------  Aortic valve area, VTI                   3.48  cm^2     ----------  Aortic valve area/bsa, VTI               1.31  cm^2/m^2 ----------  Velocity ratio, peak, LVOT/AV            0.71           ----------  Aortic valve area, peak velocity         3.49  cm^2     ----------  Aortic valve area/bsa, peak              1.31  cm^2/m^2 ----------  velocity  Velocity ratio, mean, LVOT/AV            0.67           ----------  Aortic valve area, mean velocity  3.31  cm^2     ----------  Aortic valve area/bsa, mean              1.24  cm^2/m^2 ----------  velocity  Aortic regurg pressure half-time         391   ms       ----------    Aorta                                    Value          Reference  Aortic root ID, ED                       40    mm       ----------  Ascending aorta ID, A-P, S               39    mm       ----------    Left atrium                              Value          Reference  LA ID, A-P, ES                           53    mm       ----------  LA ID/bsa, A-P                           1.99  cm/m^2   <=2.2  LA volume, S                             95.6  ml       ----------  LA volume/bsa, S                         36    ml/m^2   ----------  LA volume, ES, 1-p A4C                   86.2  ml       ----------  LA volume/bsa, ES, 1-p A4C               32.4  ml/m^2   ----------  LA volume, ES, 1-p A2C                   101   ml       ----------  LA volume/bsa, ES, 1-p A2C               38    ml/m^2   ----------     Mitral valve                             Value          Reference  Mitral E-wave peak velocity              73.3  cm/s     ----------  Mitral A-wave peak velocity              86.7  cm/s     ----------  Mitral deceleration time         (H)     261   ms       150 - 230  Mitral peak gradient, D                  2     mm Hg    ----------  Mitral E/A ratio, peak                   0.8            ----------    Right atrium                             Value          Reference  RA ID, S-I, ES, A4C              (H)     74.3  mm       34 - 49  RA area, ES, A4C                 (H)     24.7  cm^2     8.3 - 19.5  RA volume, ES, A/L                       66.6  ml       ----------  RA volume/bsa, ES, A/L                   25.1  ml/m^2   ----------    Systemic veins                           Value          Reference  Estimated CVP                            3     mm Hg    ----------    Right ventricle                          Value          Reference  TAPSE                                    17.5  mm       ----------  RV s&', lateral, S                        12.8  cm/s     ----------  Legend: (L)  and  (H)  mark values outside specified reference range.  ------------------------------------------------------------------- Prepared and Electronically Authenticated by  Ezzard Standing, MD [facility] 2019-02-21T17:08:52       Recent Labs: Lab Results  Component Value Date   WBC 13.7 (H) 11/26/2018   HGB 11.3 (L) 11/26/2018   HCT 35.7 (L) 11/26/2018   PLT 325 11/26/2018   GLUCOSE 201 (H) 11/25/2018   CHOL 142 03/17/2018   TRIG 75 03/17/2018   HDL 44 03/17/2018   LDLCALC 83 03/17/2018   ALT 66 (H) 11/25/2018   AST 41 11/25/2018   NA 136 11/25/2018   K 3.8 11/25/2018  CL 101 11/25/2018   CREATININE 0.88 11/25/2018   BUN 25 (H) 11/25/2018   CO2 26 11/25/2018   TSH 1.420 03/17/2018   INR 1.01 12/16/2017   HGBA1C 5.8 (H) 03/17/2018      Assessment / Plan: Patient stable from a  cardiac surgery standpoint, most recent echocardiogram February 2019 mild aortic insufficiency unchanged, ejection fraction 60%, follow up echo per cardiology Plan follow-up CTA of the chest in 1 year   Grace Isaac MD 10/28/2019 1:23 PM

## 2019-10-28 ENCOUNTER — Encounter: Payer: Self-pay | Admitting: Cardiothoracic Surgery

## 2019-11-03 ENCOUNTER — Ambulatory Visit: Payer: BLUE CROSS/BLUE SHIELD | Admitting: Cardiothoracic Surgery

## 2019-11-03 ENCOUNTER — Other Ambulatory Visit: Payer: BLUE CROSS/BLUE SHIELD

## 2019-11-15 ENCOUNTER — Other Ambulatory Visit: Payer: Self-pay | Admitting: Neurology

## 2019-11-15 DIAGNOSIS — G4733 Obstructive sleep apnea (adult) (pediatric): Secondary | ICD-10-CM

## 2019-11-15 DIAGNOSIS — R5383 Other fatigue: Secondary | ICD-10-CM

## 2019-11-16 MED ORDER — MODAFINIL 200 MG PO TABS: 200.0000 mg | ORAL_TABLET | Freq: Every day | ORAL | 5 refills | Status: DC

## 2019-11-22 ENCOUNTER — Telehealth: Payer: Self-pay

## 2019-11-22 NOTE — Telephone Encounter (Signed)
Received a instant approval for Modafinil 200 mg tablet. Approved through 11-21-2020. A copy of the letter will be faxed to the patient's pharmacy. Patient will be notified via mychart.

## 2019-11-22 NOTE — Telephone Encounter (Signed)
PA for Modafinil started on CMM. KeyJO:[id number]. Rx #: [id number].   [facility] has received your information, and the request will be reviewed. You may close this dialog, return to your dashboard, and perform other tasks.  You will receive an electronic determination in CoverMyMeds. You can see the latest determination by locating this request on your dashboard or by reopening this request. You will also receive a faxed copy of the determination. If you have any questions please contact [facility] at [phone].  If you need assistance, please chat with CoverMyMeds or call us at [phone].

## 2020-03-01 ENCOUNTER — Other Ambulatory Visit: Payer: Self-pay | Admitting: Adult Health

## 2020-03-07 ENCOUNTER — Encounter: Payer: Self-pay | Admitting: Physician Assistant

## 2020-03-21 ENCOUNTER — Encounter: Payer: Self-pay | Admitting: General Practice

## 2020-03-26 ENCOUNTER — Inpatient Hospital Stay
Admit: 2020-03-26 | Discharge: 2020-03-28 | Disposition: A | Discharge status: Home / Self Care | Admitting: Internal Medicine

## 2020-03-26 DIAGNOSIS — K852 Alcohol induced acute pancreatitis without necrosis or infection: Principal | ICD-10-CM

## 2020-03-26 LAB — CBC WITH AUTO DIFFERENTIAL
Absolute Baso #: 0.1 10*3/uL (ref 0.0–0.2)
Absolute Eos #: 0 10*3/uL (ref 0.0–0.5)
Absolute Lymph #: 0.6 10*3/uL — ABNORMAL LOW (ref 1.0–4.3)
Absolute Mono #: 1.2 10*3/uL — ABNORMAL HIGH (ref 0.0–0.8)
Absolute Neut #: 15.5 10*3/uL — ABNORMAL HIGH (ref 1.8–7.0)
Basophils: 0.3 % (ref 0.0–2.0)
Eosinophils: 0.1 % — ABNORMAL LOW (ref 1.0–6.0)
Granulocytes %: 89.2 % — ABNORMAL HIGH (ref 40.0–80.0)
Hematocrit: 49.9 % (ref 40.0–52.0)
Hemoglobin: 16.7 g/dL (ref 13.0–18.0)
Lymphocyte %: 3.5 % — ABNORMAL LOW (ref 20.0–40.0)
MCH: 34.4 pg — ABNORMAL HIGH (ref 26.0–34.0)
MCHC: 33.6 % (ref 32.0–36.0)
MCV: 102.7 fL — ABNORMAL HIGH (ref 80.0–98.0)
MPV: 9 fL (ref 7.4–10.4)
Monocytes: 6.9 % (ref 2.0–10.0)
Platelets: 227 10*3/uL (ref 140–440)
RBC: 4.86 10*6/uL (ref 4.40–5.90)
RDW: 13.2 % (ref 11.5–14.5)
WBC: 17.3 10*3/uL — ABNORMAL HIGH (ref 3.6–10.7)

## 2020-03-26 LAB — TROPONIN: Troponin I: <0.012 ng/mL (ref 0.000–0.034)

## 2020-03-26 LAB — HEPATIC FUNCTION PANEL
ALT: 33 U/L (ref 0–49)
AST: 64 U/L — ABNORMAL HIGH (ref 15–46)
Albumin,Serum: 4.1 g/dL (ref 3.5–5.0)
Alkaline Phosphatase: 70 U/L (ref 38–126)
Bilirubin, Direct: 0 mg/dL (ref 0.0–0.3)
Total Bilirubin: 2 mg/dL — ABNORMAL HIGH (ref 0.2–1.3)
Total Protein: 7.5 g/dL (ref 6.3–8.2)

## 2020-03-26 LAB — BASIC METABOLIC PANEL
Anion Gap: 15 mmol/L — ABNORMAL HIGH (ref 3–13)
BUN: 15 mg/dL (ref 7–20)
CO2: 18 mmol/L — ABNORMAL LOW (ref 22–30)
Calcium: 8.4 mg/dL (ref 8.4–10.4)
Chloride: 97 mmol/L — ABNORMAL LOW (ref 98–107)
Creatinine: 1.37 mg/dL — ABNORMAL HIGH (ref 0.52–1.25)
EGFR IF NonAfrican American: 55.8 mL/min — AB (ref >60)
Glucose: 168 mg/dL — ABNORMAL HIGH (ref 70–100)
Potassium: 4.5 mmol/L (ref 3.5–5.1)
Sodium: 131 mmol/L — ABNORMAL LOW (ref 135–145)
eGFR African American: 64.7 mL/min (ref >60)

## 2020-03-26 LAB — LIPASE: Lipase: 2586 U/L — ABNORMAL HIGH (ref 23–300)

## 2020-03-26 LAB — LACTIC ACID
Lactic Acid: 3.8 mmol/L (ref 0.7–2.0)
Lactic Acid: 1.4 mmol/L (ref 0.7–2.0)

## 2020-03-26 MED ORDER — NORMAL SALINE FLUSH 0.9 % IV SOLN
0.9 % | INTRAVENOUS | Status: DC | PRN
Start: 2020-03-26 — End: 2020-03-27

## 2020-03-26 MED ORDER — PROMETHAZINE HCL 25 MG PO TABS
25 MG | Freq: Four times a day (QID) | ORAL | Status: DC | PRN
Start: 2020-03-26 — End: 2020-03-28

## 2020-03-26 MED ORDER — ONDANSETRON HCL 4 MG/2ML IJ SOLN
4 MG/2ML | Freq: Once | INTRAMUSCULAR | Status: AC | PRN
Start: 2020-03-26 — End: 2020-03-26

## 2020-03-26 MED ORDER — FAMOTIDINE 20 MG/2ML IV SOLN
20 MG/2ML | Freq: Once | INTRAVENOUS | Status: AC
Start: 2020-03-26 — End: 2020-03-26
  Administered 2020-03-26: 12:00:00 20 mg via INTRAVENOUS

## 2020-03-26 MED ORDER — ENOXAPARIN SODIUM 40 MG/0.4ML SC SOLN
400.4 MG/0.4ML | Freq: Every day | SUBCUTANEOUS | Status: DC
Start: 2020-03-26 — End: 2020-03-28
  Administered 2020-03-26 – 2020-03-27 (×2): 40 mg via SUBCUTANEOUS

## 2020-03-26 MED ORDER — SODIUM CHLORIDE 0.9 % IV SOLN
0.9 % | INTRAVENOUS | Status: DC | PRN
Start: 2020-03-26 — End: 2020-03-27

## 2020-03-26 MED ORDER — SODIUM CHLORIDE 0.9 % IV SOLN
0.9 % | INTRAVENOUS | Status: DC
Start: 2020-03-26 — End: 2020-03-28
  Administered 2020-03-26 – 2020-03-27 (×3): via INTRAVENOUS

## 2020-03-26 MED ORDER — ONDANSETRON HCL 4 MG/2ML IJ SOLN
4 MG/2ML | Freq: Four times a day (QID) | INTRAMUSCULAR | Status: DC | PRN
Start: 2020-03-26 — End: 2020-03-28

## 2020-03-26 MED ORDER — SODIUM CHLORIDE 0.9 % IV BOLUS
0.9 | Freq: Once | INTRAVENOUS | Status: AC
Start: 2020-03-26 — End: 2020-03-26
  Administered 2020-03-26: 12:00:00 1500 mL via INTRAVENOUS

## 2020-03-26 MED ORDER — FENTANYL CITRATE (PF) 100 MCG/2ML IJ SOLN
100 MCG/2ML | INTRAMUSCULAR | Status: AC
Start: 2020-03-26 — End: 2020-03-26
  Administered 2020-03-26: 12:00:00 50 via INTRAVENOUS

## 2020-03-26 MED ORDER — SODIUM CHLORIDE 0.9 % IV BOLUS
0.9 | Freq: Once | INTRAVENOUS | Status: AC
Start: 2020-03-26 — End: 2020-03-26
  Administered 2020-03-26: 11:00:00 1000 mL via INTRAVENOUS

## 2020-03-26 MED ORDER — HYDROMORPHONE HCL 1 MG/ML IJ SOLN
1 MG/ML | Freq: Once | INTRAMUSCULAR | Status: DC
Start: 2020-03-26 — End: 2020-03-26

## 2020-03-26 MED ORDER — HYDROMORPHONE HCL 1 MG/ML IJ SOLN
1 MG/ML | INTRAMUSCULAR | Status: DC | PRN
Start: 2020-03-26 — End: 2020-03-28
  Administered 2020-03-26 – 2020-03-27 (×3): 0.5 mg via INTRAVENOUS

## 2020-03-26 MED ORDER — ACETAMINOPHEN 650 MG RE SUPP
650 MG | Freq: Four times a day (QID) | RECTAL | Status: DC | PRN
Start: 2020-03-26 — End: 2020-03-28

## 2020-03-26 MED ORDER — FENTANYL CITRATE (PF) 100 MCG/2ML IJ SOLN
100 MCG/2ML | Freq: Once | INTRAMUSCULAR | Status: AC
Start: 2020-03-26 — End: 2020-03-26

## 2020-03-26 MED ORDER — NORMAL SALINE FLUSH 0.9 % IV SOLN
0.9 | Freq: Two times a day (BID) | INTRAVENOUS | Status: DC
Start: 2020-03-26 — End: 2020-03-27
  Administered 2020-03-27: 10 mL via INTRAVENOUS

## 2020-03-26 MED ORDER — NORMAL SALINE FLUSH 0.9 % IV SOLN
0.9 | Freq: Three times a day (TID) | INTRAVENOUS | Status: DC
Start: 2020-03-26 — End: 2020-03-28

## 2020-03-26 MED ORDER — ACETAMINOPHEN 325 MG PO TABS
325 MG | Freq: Four times a day (QID) | ORAL | Status: DC | PRN
Start: 2020-03-26 — End: 2020-03-28
  Administered 2020-03-28: 01:00:00 650 mg via ORAL

## 2020-03-26 MED ORDER — PIPERACILLIN-TAZOBACTAM IN DEX 4-0.5 GM/100ML IV SOLN
4-0.5100 GM per 100ML | Freq: Once | INTRAVENOUS | Status: AC
Start: 2020-03-26 — End: 2020-03-26
  Administered 2020-03-26: 14:00:00 4500 mg via INTRAVENOUS

## 2020-03-26 MED ORDER — POLYETHYLENE GLYCOL 3350 17 G PO PACK
17 g | Freq: Every day | ORAL | Status: DC | PRN
Start: 2020-03-26 — End: 2020-03-28

## 2020-03-26 MED FILL — FENTANYL CITRATE (PF) 100 MCG/2ML IJ SOLN: 100 MCG/2ML | INTRAMUSCULAR | Qty: 2

## 2020-03-26 MED FILL — FAMOTIDINE 20 MG/2ML IV SOLN: 20 MG/2ML | INTRAVENOUS | Qty: 2

## 2020-03-26 MED FILL — ONDANSETRON HCL 4 MG/2ML IJ SOLN: 4 MG/2ML | INTRAMUSCULAR | Qty: 2

## 2020-03-26 MED FILL — HYDROMORPHONE HCL 1 MG/ML IJ SOLN: 1 mg/mL | INTRAMUSCULAR | Qty: 0.5

## 2020-03-26 MED FILL — ZOSYN 4-0.5 GM/100ML IV SOLN: 4-0.5 GM/100ML | INTRAVENOUS | Qty: 100

## 2020-03-26 MED FILL — ENOXAPARIN SODIUM 40 MG/0.4ML SC SOLN: 40 MG/0.4ML | SUBCUTANEOUS | Qty: 0.4

## 2020-03-26 NOTE — H&P (Signed)
History and Physical    Admit Date: 03/26/2020  PCP: No primary care provider on file.    CHIEF COMPLAINT:  abd pain        HISTORY OF PRESENT ILLNESS:    Jacob Richmond is a 59 y.o. male whopresents to the emergency department for evaluation of abdominal pain.  Patient states that he woke up Saturday morning with epigastric burning.  States that he thought it was indigestion because he had pizza the night before.  States he was able to have a normal day but did have constant pain to the upper abdomen but it was mild.  States he ate lunch and dinner that day.  Pain worsened yesterday and became more severe in the evening.  States that the cramping sharp pain to the upper abdomen.  Denies any radiation into his back or into his chest.  Denies nausea or vomiting.  Denies diarrhea or constipation.  Denies any dark tarry or bloody stools.  Denies history of abdominal surgeries.  Denies history of heart disease.  States that he does chew tobacco.  He also drinks 8 beers a day.  Denies history of pancreatitis or liver disease.    Past Medical History:    No past medical history on file.    Past Surgical History:    No past surgical history on file.    Social History:   Social History     Socioeconomic History   . Marital status: Single     Spouse name: Not on file   . Number of children: Not on file   . Years of education: Not on file   . Highest education level: Not on file   Occupational History   . Not on file   Tobacco Use   . Smoking status: Not on file   Substance and Sexual Activity   . Alcohol use: Not on file   . Drug use: Not on file   . Sexual activity: Not on file   Other Topics Concern   . Not on file   Social History Narrative   . Not on file     Social Determinants of Health     Financial Resource Strain:    . Difficulty of Paying Living Expenses:    Food Insecurity:    . Worried About Programme researcher, broadcasting/film/video in the Last Year:    . Barista in the Last Year:    Transportation Needs:    . Automotive engineer (Medical):    Marland Kitchen Lack of Transportation (Non-Medical):    Physical Activity:    . Days of Exercise per Week:    . Minutes of Exercise per Session:    Stress:    . Feeling of Stress :    Social Connections:    . Frequency of Communication with Friends and Family:    . Frequency of Social Gatherings with Friends and Family:    . Attends Religious Services:    . Active Member of Clubs or Organizations:    . Attends Banker Meetings:    Marland Kitchen Marital Status:    Intimate Partner Violence:    . Fear of Current or Ex-Partner:    . Emotionally Abused:    Marland Kitchen Physically Abused:    . Sexually Abused:      Family History:   No family history on file.  Medications Prior to Admission:    No current facility-administered medications on file prior to encounter.  No current outpatient medications on file prior to encounter.         Allergies:  Patient has no known allergies.    REVIEW OF SYSTEMS:  Constitutional: Negative for fever, chills,     HEENT: Negative for congestion,     Eyes: Negative for itching and visual disturbance.     Respiratory: Negative for apnea, chest tightness, shortness of breath, wheezing and stridor.     Cardiovascular: Negative for chest pain.     Gastrointestinal: pos for nausea, , abdominal pain,   Genitourinary: Negative for dysuria, frequency and flank pain.     Musculoskeletal: Negative for myalgias and joint swelling.     Skin: Negative for rash.     Neurological: Negative for dizziness, tremors,    Hematological: Negative for adenopathy.     Psychiatric/Behavioral: Negative for suicidal ideas, behavioral problems, self-injury and dysphoric mood.     Vitals:  BP 106/77   Pulse 87   Temp 97.2 F (36.2 C) (Temporal)   Resp 20   Ht 5\' 9"  (1.753 m)   Wt 170 lb (77.1 kg)   SpO2 97%   BMI 25.10 kg/m     Pulse Ox: SpO2  Avg: 98.3 %  Min: 95 %  Max: 100 %  Supplemental O2:      PHYSICAL EXAM:  CONSTITUTIONAL:  awake, alert, cooperative, no apparent distress, and appears  stated age    HEENT: Normocephalic, PERRLA    NECK: no JVD, no LAD    HEART: RRR, no murmurs, gallops, or rubs    LUNGS: clear to auscultation bilaterally, no wheezes, crackles, or rhonchi.    ABDOMEN: soft/  ttp epigastric      MUSCULOSKELETAL: negative for edema, +2 pulses    SKIN: intact    Neuro:  No obvious focal deficits    DATA:     Recent Results (from the past 24 hour(s))   Hemogram  (CBC) w/Auto Diff    Collection Time: 03/26/20  7:01 AM   Result Value Ref Range    WBC 17.3 (H) 3.6 - 10.7 10*3/uL    RBC 4.86 4.40 - 5.90 10*6/uL    Hemoglobin 16.7 13.0 - 18.0 g/dL    Hematocrit 49.9 40.0 - 52.0 %    MCV 102.7 (H) 80.0 - 98.0 fL    MCH 34.4 (H) 26.0 - 34.0 pg    MCHC 33.6 32.0 - 36.0 %    RDW 13.2 11.5 - 14.5 %    Platelets 227 140 - 440 10*3/uL    MPV 9.0 7.4 - 10.4 fL    Granulocytes % 89.2 (H) 40.0 - 80.0 %    Lymphocyte % 3.5 (L) 20.0 - 40.0 %    Monocytes 6.9 2.0 - 10.0 %    Eosinophils 0.1 (L) 1.0 - 6.0 %    Basophils 0.3 0.0 - 2.0 %    Absolute Neut # 15.5 (H) 1.8 - 7.0 10*3/uL    Absolute Lymph # 0.6 (L) 1.0 - 4.3 10*3/uL    Absolute Mono # 1.2 (H) 0.0 - 0.8 10*3/uL    Absolute Eos # 0.0 0.0 - 0.5 10*3/uL    Absolute Baso # 0.1 0.0 - 0.2 59*1/MB   Basic Metabolic Panel    Collection Time: 03/26/20  7:01 AM   Result Value Ref Range    Sodium 131 (L) 135 - 145 mmol/L    Potassium 4.5 3.5 - 5.1 mmol/L    Chloride 97 (L) 98 - 107 mmol/L  CO2 18 (L) 22 - 30 mmol/L    Anion Gap 15 (H) 3 - 13 mmol/L    Glucose 168 (H) 70 - 100 mg/dL    BUN 15 7 - 20 mg/dL    CREATININE 2.95 (H) 0.52 - 1.25 mg/dL    eGFR African American 64.7 >60 mL/min    EGFR IF NonAfrican American 55.8 (A) >60 mL/min    Calcium 8.4 8.4 - 10.4 mg/dL   Hepatic Function Panel    Collection Time: 03/26/20  7:01 AM   Result Value Ref Range    Albumin,Serum 4.1 3.5 - 5.0 g/dL    Total Protein 7.5 6.3 - 8.2 g/dL    Total Bilirubin 2.0 (H) 0.2 - 1.3 mg/dL    Bilirubin, Direct 0.0 0.0 - 0.3 mg/dL    Alkaline Phosphatase 70 38 - 126 U/L    ALT 33  0 - 49 U/L    AST 64 (H) 15 - 46 U/L   Lactic Acid, Plasma    Collection Time: 03/26/20  7:01 AM   Result Value Ref Range    Lactic Acid 3.8 (HH) 0.7 - 2.0 mmol/L   Lipase    Collection Time: 03/26/20  7:01 AM   Result Value Ref Range    Lipase 2,586 (H) 23 - 300 U/L   Troponin x1    Collection Time: 03/26/20  7:01 AM   Result Value Ref Range    Troponin I <0.012 0.000 - 0.034 ng/mL           IMPRESSION:      Active Problems:    Acute pancreatitis  Resolved Problems:    * No resolved hospital problems. *       Likely secondary to ETOH   Cont IVF and supportive care    NPO   Monitor for withdraw.  Per pt quit for long periods of time in past without issues             -see additional orders, further recommendations to follow        Orders Placed This Encounter   Procedures   . XR CHEST PORTABLE   . CT Abdomen Pelvis W Contrast   . Hemogram  (CBC) w/Auto Diff   . Basic Metabolic Panel   . Hepatic Function Panel   . Lactic Acid, Plasma   . Lipase   . Urinalysis   . Troponin x1   . Basic Metabolic Panel w/ Reflex to MG   . Hepatic function panel   . CBC auto differential   . Diet NPO Effective Now   . Vital signs per Policy   . Cardiac telemetry monitoring   . Check pulse oximetry   . Vital signs per unit routine   . Up as tolerated   . Full Code   . Initiate Oxygen Therapy Protocol   . EKG 12 Lead - Chest Pain   . Saline lock IV   . PATIENT STATUS (FROM ED OR OR/PROCEDURAL) Inpatient       Code status: Full Code    Please forward a copy of this H&P to the patient's PCP. Thank you.  Electronically signed by Earlyne Iba, MD on 03/26/20 at 12:40 PM EDT

## 2020-03-27 LAB — CBC WITH AUTO DIFFERENTIAL
Absolute Baso #: 0 10*3/uL (ref 0.0–0.2)
Absolute Eos #: 0 10*3/uL (ref 0.0–0.5)
Absolute Lymph #: 0.7 10*3/uL — ABNORMAL LOW (ref 1.0–4.3)
Absolute Mono #: 1 10*3/uL — ABNORMAL HIGH (ref 0.0–0.8)
Absolute Neut #: 10 10*3/uL — ABNORMAL HIGH (ref 1.8–7.0)
Basophils: 0.2 % (ref 0.0–2.0)
Eosinophils: 0.1 % — ABNORMAL LOW (ref 1.0–6.0)
Granulocytes %: 85.2 % — ABNORMAL HIGH (ref 40.0–80.0)
Hematocrit: 41.4 % (ref 40.0–52.0)
Hemoglobin: 14.1 g/dL (ref 13.0–18.0)
Lymphocyte %: 6.3 % — ABNORMAL LOW (ref 20.0–40.0)
MCH: 34.5 pg — ABNORMAL HIGH (ref 26.0–34.0)
MCHC: 34 % (ref 32.0–36.0)
MCV: 101.6 fL — ABNORMAL HIGH (ref 80.0–98.0)
MPV: 8.6 fL (ref 7.4–10.4)
Monocytes: 8.2 % (ref 2.0–10.0)
Platelets: 142 10*3/uL (ref 140–440)
RBC: 4.08 10*6/uL — ABNORMAL LOW (ref 4.40–5.90)
RDW: 13.2 % (ref 11.5–14.5)
WBC: 12.1 10*3/uL — ABNORMAL HIGH (ref 3.6–10.7)

## 2020-03-27 LAB — BASIC METABOLIC PANEL W/ REFLEX TO MG FOR LOW K
Anion Gap: 5 mmol/L (ref 3–13)
BUN: 13 mg/dL (ref 7–20)
CO2: 24 mmol/L (ref 22–30)
Calcium: 7.1 mg/dL — ABNORMAL LOW (ref 8.4–10.4)
Chloride: 106 mmol/L (ref 98–107)
Creatinine: 0.94 mg/dL (ref 0.52–1.25)
EGFR IF NonAfrican American: 88.1 mL/min (ref >60)
Glucose: 121 mg/dL — ABNORMAL HIGH (ref 70–100)
Potassium: 4.1 mmol/L (ref 3.5–5.1)
Sodium: 135 mmol/L (ref 135–145)
eGFR African American: >90 mL/min (ref >60)

## 2020-03-27 LAB — HEPATIC FUNCTION PANEL
ALT: 20 U/L (ref 0–49)
AST: 45 U/L (ref 15–46)
Albumin,Serum: 2.9 g/dL — ABNORMAL LOW (ref 3.5–5.0)
Alkaline Phosphatase: 50 U/L (ref 38–126)
Bilirubin, Direct: 0 mg/dL (ref 0.0–0.3)
Total Bilirubin: 1 mg/dL (ref 0.2–1.3)
Total Protein: 5.8 g/dL — ABNORMAL LOW (ref 6.3–8.2)

## 2020-03-27 LAB — ADD ON LAB TEST

## 2020-03-27 LAB — LIPASE: Lipase: 607 U/L — ABNORMAL HIGH (ref 23–300)

## 2020-03-27 MED ORDER — LORAZEPAM 2 MG/ML IJ SOLN
2 MG/ML | INTRAMUSCULAR | Status: DC | PRN
Start: 2020-03-27 — End: 2020-03-28

## 2020-03-27 MED ORDER — NORMAL SALINE FLUSH 0.9 % IV SOLN
0.9 % | INTRAVENOUS | Status: DC | PRN
Start: 2020-03-27 — End: 2020-03-28

## 2020-03-27 MED ORDER — THIAMINE MONONITRATE 100 MG PO TABS
100 MG | Freq: Every day | ORAL | Status: DC
Start: 2020-03-27 — End: 2020-03-28
  Administered 2020-03-27 – 2020-03-28 (×2): 100 mg via ORAL

## 2020-03-27 MED ORDER — SODIUM CHLORIDE 0.9 % IV SOLN
0.9 | INTRAVENOUS | Status: DC | PRN
Start: 2020-03-27 — End: 2020-03-28

## 2020-03-27 MED ORDER — LORAZEPAM 1 MG PO TABS
1 MG | ORAL | Status: DC | PRN
Start: 2020-03-27 — End: 2020-03-28

## 2020-03-27 MED ORDER — MULTIPLE VITAMINS PO TABS
Freq: Every day | ORAL | Status: DC
Start: 2020-03-27 — End: 2020-03-28
  Administered 2020-03-27 – 2020-03-28 (×2): 1 via ORAL

## 2020-03-27 MED ORDER — NORMAL SALINE FLUSH 0.9 % IV SOLN
0.9 % | Freq: Two times a day (BID) | INTRAVENOUS | Status: DC
Start: 2020-03-27 — End: 2020-03-28

## 2020-03-27 MED FILL — HYDROMORPHONE HCL 1 MG/ML IJ SOLN: 1 mg/mL | INTRAMUSCULAR | Qty: 0.5

## 2020-03-27 MED FILL — TAB-A-VITE/BETA CAROTENE PO TABS: ORAL | Qty: 1

## 2020-03-27 MED FILL — THIAMINE MONONITRATE 100 MG PO TABS: 100 mg | ORAL | Qty: 1

## 2020-03-27 MED FILL — ENOXAPARIN SODIUM 40 MG/0.4ML SC SOLN: 40 MG/0.4ML | SUBCUTANEOUS | Qty: 0.4

## 2020-03-27 NOTE — Progress Notes (Signed)
Hospitalist Progress Note    03/27/2020 10:53 AM  Subjective:   Admit Date: 03/26/2020  PCP: No primary care provider on file.    Interval History: pt feels better today     No overnight issues.    Pain much improved      DIET CLEAR LIQUID;   Date 03/27/20 0000 - 03/27/20 2359   Shift 0000-0759 [id number] 1600-2359 24 Hour Total   INTAKE   Shift Total(mL/kg)       OUTPUT   Urine(mL/kg/hr) 900(1.5) 250  1150   Shift Total(mL/kg) 900(11.7) 250(3.2)  1150(14.9)   Weight (kg) 77.1 77.1 77.1 77.1     Patient Vitals for the past 96 hrs (Last 3 readings):   Weight   03/26/20 0630 170 lb (77.1 kg)     Medications:     . sodium chloride     . sodium chloride 150 mL/hr at 03/27/20 0006     . sodium chloride flush  3 mL Intravenous Q8H   . sodium chloride flush  5-40 mL Intravenous 2 times per day   . enoxaparin  40 mg Subcutaneous Daily     Recent Labs     03/26/20  0701 03/27/20  0001   WBC 17.3* 12.1*   HGB 16.7 14.1   PLT 227 142     Recent Labs     03/26/20  0701 03/27/20  0001   NA 131* 135   K 4.5 4.1   CL 97* 106   CO2 18* 24   BUN 15 13   CREATININE 1.37* 0.94   GLUCOSE 168* 121*     Recent Labs     03/26/20  0701 03/27/20  0001   AST 64* 45   ALT 33 20   BILITOT 2.0* 1.0   ALKPHOS 70 50     No results found for: TRIG, HDL, LDLCALC, CHOL    No results for input(s): INR in the last 72 hours.  Recent Labs     03/26/20  0701   TROPONINI <0.012         Objective:     Vitals: BP 130/73   Pulse 87   Temp 98.9 F (37.2 C) (Temporal)   Resp 20   Ht 5\' 9"  (1.753 m)   Wt 170 lb (77.1 kg)   SpO2 94%   BMI 25.10 kg/m   Pulse Ox: SpO2  Avg: 95.7 %  Min: 94 %  Max: 97 %  Supplemental O2:      General appearance: Alert and cooperative with exam    Lungs: clear to auscultation bilaterally    Heart: regular rate and rhythm, S1, S2 normal, no murmur, click, rub or gallop    Abdomen: soft, non-tender; bowel sounds normal; no masses,  no organomegaly    Extremities: extremities normal, atraumatic, no cyanosis or  edema    Neurologic: mild tremor    Assessment     Active Problems:    Acute pancreatitis  Resolved Problems:    * No resolved hospital problems. *       Trial clears today   Add on PRN CIWA --d3 no ETOH and tremor   Supportive care otherwise       See orders, continue POC    Advance Directive: Full Code       , MD,  Rounding Hospitalist

## 2020-03-27 NOTE — Progress Notes (Signed)
[facility] [address] [place]. [place] [address] [phone]       OFFICE FOLLOW UP NOTE  [name] Date of Birth:  03-01-1961 Medical Record Number:  [id number]   Reason for visit: Stroke follow up [place] provider: Dr. Leonie Man  CHIEF COMPLAINT:  Chief Complaint  Patient presents with  . Follow-up    stroke fu, cpap, rm 9, alone, pt states he has not improved much     HPI:   [name] is a pleasant 59 year old male with PMH of DM, HTN, HLD, OSA on CPAP, CAD, CABG and valve sparing aortic root replacement 2013, thoracic aortic aneurysm without rupture and left pontine infarct in 11/2017.  Recovered well from a stroke standpoint but continued to have ongoing poststroke depression, poststroke headache and excessive fatigue with exertion accompanied by worsening imbalance, gait impairment and headache.  Due to ongoing deficits, he continues to receive Social Security disability.  Initiated on amitriptyline 25 mg nightly for benefit of headache and depression as well as modafinil 200 mg daily for excessive daytime fatigue limiting daytime functioning.    Today, 03/28/2020, he has been stable from a stroke standpoint without worsening or new stroke/TIA symptoms. He continues to experience day time fatigue worsened with exertion. He initially had great benefit with use of modafinial but over the past couple months, he has noticed more day time fatigue. He does admit to not using CPAP regulary since November due to increased stressors with taking care of his father with dementia and Parkinson's as well as caring for his wife. He has not had follow up with pulmonology since obtaining machine and 2018.  Sleep study at that time showed severe obstructive sleep apnea.  Reports worsening depression/anxiety recently but does have numerous family stressors including caring for his father with dementia and parkinson's, wife had recent surgery and he had recent diagnosis  of prostate cancer. Currently on bupropion 300mg  daily and sertaline 150mg  daily.  He questions benefit with ongoing use of sertraline.  Headaches have been stable with ongoing use of amitrityline 25mg  nightly.  Continues on aspirin and atorvastatin for secondary stroke prevention.  02/07/2020 lipid panel showed LDL 173 (prior 91 in December but he admits to stopping atorvastatin prior but has restated since then).  Blood pressure today 162/98. Does not routinely monitor at home.   Recent A1c 6.1.  No further concerns at this time.      ROS:   Review of Systems  Constitutional: Positive for malaise/fatigue.  HENT: Negative.   Eyes: Negative.   Respiratory: Negative.   Cardiovascular: Negative.   Gastrointestinal: Negative.   Genitourinary: Negative.  Negative for frequency and urgency.  Musculoskeletal: Negative.   Skin: Negative.   Neurological: Negative.  Negative for headaches.  Endo/Heme/Allergies: Negative.   Psychiatric/Behavioral: Positive for depression. The patient is nervous/anxious.      PMH:  Past Medical History:  Diagnosis Date  . Alcohol abuse   . Aortic root dilatation (HCC)    a. echo 4/12: EF 55-60%, mod to marked dilated ascending aortic root;   b. MRA 4/12: Aortic root 5.5 cm, dilation of left and right coronary cusps, trileaflet aortic valve // Echo 2/19: Moderate LVH, EF 55-60, normal wall motion, grade 1 diastolic dysfunction, mild AI, mildly dilated aortic root, moderate to severe LAE (aortic root 40 mm; ascending aorta 39 mm)   . Arthritis   . Asthma    ast attack in early 20's  . CAD (coronary artery disease)   .  Chest pain    Myoview 4/12: EF 62%, no ischemia or scar  . CHF (congestive heart failure) ([place])   . Chronic pain    back and hands  . Constipation   . COPD (chronic obstructive pulmonary disease) ([place])   . Depression   . Diabetes mellitus    type 2  . Difficulty speaking   . Drug use   . Enlarged prostate   . Fatigue   . GERD  (gastroesophageal reflux disease)    modifies with diet  . Hx of echocardiogram    Echo (2/16):  Mod LVH, EF 55-60%, mild to mod AI, mod LAE, mild RAE, Aortic root 38 mm, Asc aorta 43 mm  . Hyperlipidemia   . Hypertension   . Joint pain   . Knee pain, bilateral   . Leg pain    ABIs 3/19: Normal (R 1.3; L 1.28)  . OSA (obstructive sleep apnea) 2006   .  Wears at times  . Osteoarthritis   . Palpitations   . Stroke [facility])     PSH:  Past Surgical History:  Procedure Laterality Date  . APPENDECTOMY  12/2016  . CHOLECYSTECTOMY N/A 11/24/2018   Procedure: LAPAROSCOPIC CHOLECYSTECTOMY;  Surgeon: Stark Klein, MD;  Location: [facility];  Service: General;  Laterality: N/A;  . Colonscopy    . CORONARY ARTERY BYPASS GRAFT  05/07/2012   Procedure: CORONARY ARTERY BYPASS GRAFTING (CABG);  Surgeon: Grace Isaac, MD;  Location: [place];  Service: Open Heart Surgery;  Laterality: N/A;  . KNEE ARTHROSCOPY Left 2010   Dr. Rosamaria Lints  . LAPAROSCOPIC APPENDECTOMY N/A 01/06/2017   Procedure: APPENDECTOMY LAPAROSCOPIC;  Surgeon: Ralene Ok, MD;  Location: [place];  Service: General;  Laterality: N/A;  . Retactment of fingers     as a child , sewed with sewing machine- Index and middle finger  . UPPER GASTROINTESTINAL ENDOSCOPY      Social History:  Social History   Socioeconomic History  . Marital status: Married    Spouse name: Jonathan Miles  . Number of children: 0  . Years of education: 12th grade  . Highest education level: Not on file  Occupational History  . 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  Tobacco Use  . Smoking status: Former Smoker    Packs/day: 1.00    Years: 13.00    Pack years: 13.00    Types: Cigarettes    Quit date: 01/23/1988    Years since quitting: 32.2  . Smokeless tobacco: Never Used  Substance and Sexual Activity  . Alcohol use: Yes    Comment: quit 1989  . Drug use: No    Comment: previous cocaine  and marijuana use, quit 1988  . Sexual activity: Not Currently    Partners: Female  Other Topics Concern  . Not on file  Social History Narrative   Married, lives in [place]. Therapist, occupational for [employer].    Pt is adopted. Unsure of his family medical history.   Social Determinants of Health   Financial Resource Strain:   . Difficulty of Paying Living Expenses:   Food Insecurity:   . Worried About Charity fundraiser in the Last Year:   . Arboriculturist in the Last Year:   Transportation Needs:   . Film/video editor (Medical):   Marland Kitchen Lack of Transportation (Non-Medical):   Physical Activity:   . Days of Exercise per Week:   . Minutes  of Exercise per Session:   Stress:   . Feeling of Stress :   Social Connections:   . Frequency of Communication with Friends and Family:   . Frequency of Social Gatherings with Friends and Family:   . Attends Religious Services:   . Active Member of Clubs or Organizations:   . Attends Archivist Meetings:   Marland Kitchen Marital Status:   Intimate Partner Violence:   . Fear of Current or Ex-Partner:   . Emotionally Abused:   Marland Kitchen Physically Abused:   . Sexually Abused:     Family History:  Family History  Adopted: Yes  Family history unknown: Yes    Medications:   Current Outpatient Medications on File Prior to Visit  Medication Sig Dispense Refill  . acetaminophen (TYLENOL) 500 MG tablet TAKE 1-2 TABS EVERY 6 HOURS AS NEEDED FOR PAIN.  Do not take more than 4000 mg of Tylenol per day it can harm your liver. 30 tablet 0  . amitriptyline (ELAVIL) 25 MG tablet TAKE ONE TABLET BY MOUTH AT BEDTIME 30 tablet 5  . aspirin 325 MG EC tablet Take 1 tablet (325 mg total) by mouth at bedtime. 90 tablet 3  . atorvastatin (LIPITOR) 80 MG tablet Take 1 tablet (80 mg total) by mouth daily at 6 PM. 90 tablet 3  . buPROPion (WELLBUTRIN XL) 300 MG 24 hr tablet Take 1 tablet (300 mg total) by mouth daily. 30 tablet 6  . carvedilol (COREG)  12.5 MG tablet TAKE ONE TABLET BY MOUTH TWO TIMES A DAY (Patient taking differently: Take 12.5 mg by mouth 2 (two) times daily. ) 180 tablet 2  . felodipine (PLENDIL) 10 MG 24 hr tablet TAKE ONE TABLET BY MOUTH DAILY (Patient taking differently: Take 10 mg by mouth daily. ) 90 tablet 2  . furosemide (LASIX) 40 MG tablet TAKE ONE TABLET BY MOUTH DAILY (Patient taking differently: Take 40 mg by mouth daily. ) 90 tablet 2  . lidocaine (XYLOCAINE) 5 % ointment Apply 1 application topically as needed. Daily for leg pain 50 g 3  . lisinopril (PRINIVIL,ZESTRIL) 20 MG tablet TAKE ONE TABLET BY MOUTH DAILY (Patient taking differently: Take 20 mg by mouth daily. ) 90 tablet 2  . loratadine (CLARITIN) 10 MG tablet Take 10 mg by mouth at bedtime.     . Menthol-Methyl Salicylate (THERA-GESIC) 0.5-15 % CREA Apply 1 application topically daily as needed (pain).    . metFORMIN (GLUCOPHAGE) 1000 MG tablet Take 1 tablet (1,000 mg total) by mouth 2 (two) times daily with a meal. 180 tablet 3  . mirabegron ER (MYRBETRIQ) 50 MG TB24 tablet Take 50 mg by mouth at bedtime.     . modafinil (PROVIGIL) 200 MG tablet Take 1 tablet (200 mg total) by mouth daily. 30 tablet 5  . tamsulosin (FLOMAX) 0.4 MG CAPS capsule Take 2 capsules (0.8 mg total) by mouth daily after supper. 180 capsule 3  . traMADol (ULTRAM) 50 MG tablet Take by mouth every 6 (six) hours as needed.    Loura Pardon Salicylate (ASPERCREME EX) Apply 1 application topically daily as needed (pain).     No current facility-administered medications on file prior to visit.    Allergies:   Allergies  Allergen Reactions  . Ibuprofen Other (See Comments)    TOLD NOT TO TAKE-PER CARDS  . Iodinated Diagnostic Agents Hives    Hives started 4 days after cta chest was performed, minimal relief w/ benadryl x 3 days, uncertain if reaction was  actually iv contrast or other allergen,allergy tests to follow.wife will make [place] aware of results//a.[place]  . Quinolones Other  (See Comments)     Physical Exam  Vitals:   03/28/20 1249  BP: (!) 162/98  Pulse: 85  Weight: (!) 322 lb (146.1 kg)  Height: 6\' 4"  (1.93 m)   Body mass index is 39.2 kg/m. No exam data present  Depression screen [facility] 2/9 03/28/2020  Decreased Interest 2  Down, Depressed, Hopeless 2  PHQ - 2 Score 4  Altered sleeping 3  Tired, decreased energy 3  Change in appetite 1  Feeling bad or failure about yourself  1  Trouble concentrating 3  Moving slowly or fidgety/restless 1  Suicidal thoughts 0  PHQ-9 Score 16  Difficult doing work/chores -  Some recent data might be hidden   General: Obese pleasant middle-age male, seated, in no evident distress but flat affect Head: head normocephalic and atraumatic.   Neck: supple with no carotid or supraclavicular bruits Cardiovascular: regular rate and rhythm, no murmurs Musculoskeletal: no deformity Skin:  no rash/petichiae Vascular:  Normal pulses all extremities  Neurologic Exam Mental Status: Awake and fully alert.  Fluent speech and language.  Oriented to place and time. Recent and remote memory intact. Attention span, concentration and fund of knowledge appropriate. Mood and affect flat. Cranial Nerves: Pupils equal, briskly reactive to light. Extraocular movements full without nystagmus. Visual fields full to confrontation. Hearing intact. Facial sensation intact.   Motor: Full strength in all tested extremities Sensory.: intact to touch , pinprick , position and vibratory sensation.  Coordination: Rapid alternating movements normal in all extremities. Finger-to-nose and heel-to-shin performed accurately bilaterally.   Gait and Station: Arises from chair without difficulty. Stance is normal. Gait demonstrates normal stride and balance.  Able to heel, toe and tandem walk with mild difficulty.  Reflexes: 1+ and symmetric. Toes downgoing.      ASSESSMENT/PLAN: Jonathan Miles is a 59 y.o. year old male here with left pontine infarct  on 12/17/2017 secondary to small vessel disease. Vascular risk factors include HTN, HLD, DM, OSA on CPAP and CAD.  Recently diagnosed with prostate cancer with plans on ongoing monitoring without intervention at this time   Stroke: -Continue aspirin 305 mg daily and atorvastatin for secondary stroke prevention -Continue to follow with PCP for HTN, HLD and DM management as well as ongoing prescribing of atorvastatin -Maintain strict control of hypertension with blood pressure goal below 130/90, diabetes with hemoglobin A1c goal below 6.5% and cholesterol with LDL cholesterol (bad cholesterol) goal below 70 mg/dL. I also advised the patient to eat a healthy diet with plenty of whole grains, cereals, fruits and vegetables, exercise regularly and maintain ideal body weight.  Post stroke fatigue Post stroke depression/anxiety -Recent worsening likely situational due to current health condition, increased fatigue and family stressors -Increase sertraline 200 mg nightly -advised to trial taking at nighttime to see if this helps with daytime fatigue -Continue current dose of bupropion 300 mg daily, amitriptyline 25 mg nightly and modafinil 200 mg daily -We will consider discontinuing amitriptyline in the future as headaches have been stable  Severe OSA: -Noncompliance of CPAP likely cause of worsening daytime fatigue.  Review of recent 90-day compliance report only 4 days usage and only 1 day greater than 4 hours with residual AHI 16.5 -Discussion regarding importance of nightly use for cardiovascular and stroke risk factors -Advised appointment as needed with pulmonology for further review   Follow up in 6 months or call  earlier if needed   I spent 35 minutes of face-to-face and non-face-to-face time with patient.  This included previsit chart review, lab review, study review, order entry, electronic health record documentation, patient education   Jonathan Miles, [facility]  [facility] [address] [place], [place] [address]  Phone [phone] Fax [phone]

## 2020-03-28 ENCOUNTER — Encounter: Payer: Self-pay | Admitting: Adult Health

## 2020-03-28 ENCOUNTER — Ambulatory Visit (INDEPENDENT_AMBULATORY_CARE_PROVIDER_SITE_OTHER): Payer: 59 | Admitting: Adult Health

## 2020-03-28 ENCOUNTER — Telehealth: Payer: Self-pay

## 2020-03-28 ENCOUNTER — Other Ambulatory Visit: Payer: Self-pay

## 2020-03-28 VITALS — BP 162/98 | HR 85 | Ht 76.0 in | Wt 322.0 lb

## 2020-03-28 DIAGNOSIS — I69398 Other sequelae of cerebral infarction: Secondary | ICD-10-CM

## 2020-03-28 DIAGNOSIS — Z8673 Personal history of transient ischemic attack (TIA), and cerebral infarction without residual deficits: Secondary | ICD-10-CM

## 2020-03-28 DIAGNOSIS — I10 Essential (primary) hypertension: Secondary | ICD-10-CM | POA: Diagnosis not present

## 2020-03-28 DIAGNOSIS — E119 Type 2 diabetes mellitus without complications: Secondary | ICD-10-CM

## 2020-03-28 DIAGNOSIS — E785 Hyperlipidemia, unspecified: Secondary | ICD-10-CM

## 2020-03-28 DIAGNOSIS — F4323 Adjustment disorder with mixed anxiety and depressed mood: Secondary | ICD-10-CM

## 2020-03-28 DIAGNOSIS — G4733 Obstructive sleep apnea (adult) (pediatric): Secondary | ICD-10-CM

## 2020-03-28 DIAGNOSIS — F0631 Mood disorder due to known physiological condition with depressive features: Secondary | ICD-10-CM

## 2020-03-28 LAB — CBC
Hematocrit: 35.5 % — ABNORMAL LOW (ref 40.0–52.0)
Hemoglobin: 12.5 g/dL — ABNORMAL LOW (ref 13.0–18.0)
MCH: 35.2 pg — ABNORMAL HIGH (ref 26.0–34.0)
MCHC: 35.2 % (ref 32.0–36.0)
MCV: 100.2 fL — ABNORMAL HIGH (ref 80.0–98.0)
MPV: 8.5 fL (ref 7.4–10.4)
Platelets: 107 10*3/uL — ABNORMAL LOW (ref 140–440)
RBC: 3.55 10*6/uL — ABNORMAL LOW (ref 4.40–5.90)
RDW: 12.6 % (ref 11.5–14.5)
WBC: 9.7 10*3/uL (ref 3.6–10.7)

## 2020-03-28 MED ORDER — SERTRALINE HCL 100 MG PO TABS: 200.0000 mg | ORAL_TABLET | Freq: Every day | ORAL | 3 refills | Status: DC

## 2020-03-28 MED FILL — ACETAMINOPHEN 325 MG PO TABS: 325 mg | ORAL | Qty: 2

## 2020-03-28 MED FILL — THIAMINE MONONITRATE 100 MG PO TABS: 100 mg | ORAL | Qty: 1

## 2020-03-28 NOTE — Discharge Summary (Addendum)
Hospitalist Discharge Summary    Jacob Richmond  DOB:  26-Dec-1960  MRN:  [id number]    ADMIT DATE:  03/26/2020  DISCHARGE DATE:  03/28/2020    PRIMARY CARE PHYSICIAN:  No primary care provider on file.        CODE STATUS:  Full Code    DISCHARGE DIAGNOSES:  Active Problems:    Acute pancreatitis  Resolved Problems:    * No resolved hospital problems. *    Lactic Acidosis   Moderate malnutrition      HOSPITAL COURSE:   Jacob Richmond a 59 y.o.malewhopresents to the emergency department for evaluation of abdominal pain. Patient states that he woke up Saturday morning with epigastric burning. States that he thought it was indigestion because he had pizza the night before. States he was able to have a normal day but did have constant pain to the upper abdomen but it was mild. States he ate lunch and dinner that day. Pain worsened yesterday and became more severe in the evening. States that the cramping sharp pain to the upper abdomen. Denies any radiation into his back or into his chest. Denies nausea or vomiting. Denies diarrhea or constipation. Denies any dark tarry or bloody stools. Denies history of abdominal surgeries. Denies history of heart disease. States that he does chew tobacco. He also drinks 8 beers a day. Denies history of pancreatitis or liver disease.     Imaging and labs consistent with pancreatitis    Was given ivf and supportive care    His condition improved--was able to tol po at dc    Encouraged to stop etoh    CONSULTANTS: None    PHYSICAL EXAM:  CONSTITUTIONAL:  awake, alert, cooperative, no apparent distress, and appears stated age  HEENT: Normocephalic, PERRLA  NECK: no JVD, no LAD  HEART: RRR, no murmurs, gallops, or rubs  LUNGS: clear to auscultation bilaterally, no wheezes, crackles, or rhonchi.  ABDOMEN: soft/NT/ND, positive BS  MUSCULOSKELETAL: negative for edema, +2 pulses  SKIN: intact    DISCHARGE MEDICATIONS:      There are no discharge medications for this patient.        DIET: ADULT DIET; Regular; Low Fat (less than or equal to 50 gm/day)    ACTIVITY: As tolerated            RECOMMENDED NEXT STEPS:     DISPOSITION: Home      Follow up with No primary care provider on file. as specified on the Discharge Instructions.      Discharge time spent greater than 30 minutes  SIGNED:  Earlyne Iba, MD  03/28/2020, 1:50 PM

## 2020-03-28 NOTE — Patient Instructions (Signed)
Please schedule follow up with [facility] for CPAP follow up - very important for nightly compliance   Continue modafinil 200mg  daily with ongoing benefit  Continue bupropion 300mg  and amitriptyline   Increase sertraline from 150mg  nightly to 200mg  nightly  Continue aspirin 81 mg daily  and atorvastatin  for secondary stroke prevention  Continue to follow up with PCP regarding cholesterol, blood pressure and diabetes management   Continue to monitor blood pressure at home  Maintain strict control of hypertension with blood pressure goal below 130/90, diabetes with hemoglobin A1c goal below 6.5% and cholesterol with LDL cholesterol (bad cholesterol) goal below 70 mg/dL. I also advised the patient to eat a healthy diet with plenty of whole grains, cereals, fruits and vegetables, exercise regularly and maintain ideal body weight.  Followup in the future with me in 6 months or call earlier if needed       Thank you for coming to see [place] at [facility]. I hope we have been able to provide you high quality care today.  You may receive a patient satisfaction survey over the next few weeks. We would appreciate your feedback and comments so that we may continue to improve ourselves and the health of our patients.

## 2020-03-28 NOTE — Telephone Encounter (Signed)
Spoke to pt to get DME info from him Pt states he is with [employer],  Asked pt to bring cpap to appt today just in case

## 2020-03-29 NOTE — Progress Notes (Signed)
I agree with the above plan 

## 2020-05-01 NOTE — Progress Notes (Signed)
Cardiology Office Note:    Date:  05/02/2020   ID:  Jonathan Miles, DOB [birth year], MRN [id number]  PCP:  Harrison Mons, PA  Cardiologist:  Fransico Him, MD  / Richardson Dopp, PA-C  Electrophysiologist:  None   Referring MD: Harrison Mons, PA   Chief Complaint:  Follow-up (CAD, CHF)    Patient Profile:    Jonathan Miles is a 59 y.o. male with:   Aortic root aneurysm  Coronary artery disease   S/p aortic valve sparing aortic root replacement; CABG x 1 in 7/13  Diabetes mellitus   Hypertension   Hyperlipidemia   Diastolic CHF  Hx of CVA in 11/2017 (L pons; felt to be due to small vessel dz)  OSA   S/p appendectomy in 2018  Gallstones s/p cholecystectomy  Prostate CA   Prior CV studies: Chest CTA 10/18/2019 1. Postoperative change in the aortic root region appear stable. Maximum diameter of the ascending thoracic aorta measures 4.2 x 4.1 cm, essentially stable. No evident dissection. Status post coronary artery bypass grafting. Multiple foci of native coronary artery calcification. 2.  No demonstrable pulmonary embolus. 3. Evidence of prior granulomatous disease. No lung edema or consolidation. 4.  No evident adenopathy. 5.  Gallbladder absent.  Carotid US 12/17/17 Bilat 1-39  Echocardiogram 12/17/17 Mod LVH, EF 55-60, no RWMA, Gr 1 DD, mild AI, mild dilated Ao root, mod to severe LAE   ABIs 01/06/18 Normal  Echo 12/24/16 Moderate LVH, EF 78-46, grade 2 diastolic dysfunction, mild AI, mild LAE aortic root 37 mm  Echo 12/14/15 Moderate focal basal and mild concentric LVH, EF 60-65, normal wall motion, grade 2 diastolic dysfunction, mild to moderate AI, mildly dilated ascending aorta (44 mm), mild MR, moderate LAE, mild RAE, PASP 37  Echo 12/11/14 Moderate LVH, EF 55-60%, mild to moderate AI directed eccentrically in the LVOT and towards the mitral anterior leaflet, aortic root 38 mm, ascending aorta 43 mm, moderate LAE, mild RAE  Echocardiogram  07/2013 Mild LVH, EF 60, diastolic dysfunction, mild AI, aortic root 41 mm, a sending aorta 33 mm, mild to moderate LAE, mildly reduced RVSF, mild RAE, PASP 31  LHC (1/13) long 50% D2 stenosis, 60% proximal RCA, and 50% mid RCA.   Myoview (4/12):  EF 67%, no ischemia or infarction.   History of Present Illness:    Jonathan Miles was last seen in 10/2017.  He returns for follow up.  He is here today with his wife.  He has been through a lot over the past 2 years.  He had a stroke in 11/2017 and has had residual balance and strength issues.  He was forced to retire from the [employer].  He has been dx with Prostate CA.  He is weighing his options for treatment.  His dad's health is worsening and he helps take care of him.  He had his gallbladder out last year.  He was established with Dr. Radford Pax during that admission.  She saw him for surgical clearance.  He has not had chest pain, significant shortness of breath, syncope.  He sleeps on 2 pillows and uses CPAP at night.  He has some leg swelling.     Past Medical History:  Diagnosis Date  . Alcohol abuse   . Aortic root dilatation (HCC)    a. echo 4/12: EF 55-60%, mod to marked dilated ascending aortic root;   b. MRA 4/12: Aortic root 5.5 cm, dilation of left and right coronary cusps, trileaflet aortic valve // Echo  2/19: Moderate LVH, EF 55-60, normal wall motion, grade 1 diastolic dysfunction, mild AI, mildly dilated aortic root, moderate to severe LAE (aortic root 40 mm; ascending aorta 39 mm)   . Arthritis   . Asthma    ast attack in early 20's  . CAD (coronary artery disease)   . Chest pain    Myoview 4/12: EF 62%, no ischemia or scar  . CHF (congestive heart failure) ([place])   . Chronic pain    back and hands  . Constipation   . COPD (chronic obstructive pulmonary disease) ([place])   . Depression   . Diabetes mellitus    type 2  . Difficulty speaking   . Drug use   . Enlarged prostate   . Fatigue   . GERD (gastroesophageal  reflux disease)    modifies with diet  . Hx of echocardiogram    Echo (2/16):  Mod LVH, EF 55-60%, mild to mod AI, mod LAE, mild RAE, Aortic root 38 mm, Asc aorta 43 mm  . Hyperlipidemia   . Hypertension   . Joint pain   . Knee pain, bilateral   . Leg pain    ABIs 3/19: Normal (R 1.3; L 1.28)  . OSA (obstructive sleep apnea) 2006   .  Wears at times  . Osteoarthritis   . Palpitations   . Stroke [facility])     Current Medications: Current Meds  Medication Sig  . acetaminophen (TYLENOL) 500 MG tablet TAKE 1-2 TABS EVERY 6 HOURS AS NEEDED FOR PAIN.  Do not take more than 4000 mg of Tylenol per day it can harm your liver.  Marland Kitchen amitriptyline (ELAVIL) 25 MG tablet TAKE ONE TABLET BY MOUTH AT BEDTIME  . aspirin 325 MG EC tablet Take 1 tablet (325 mg total) by mouth at bedtime.  Marland Kitchen atorvastatin (LIPITOR) 80 MG tablet Take 1 tablet (80 mg total) by mouth daily at 6 PM.  . buPROPion (WELLBUTRIN XL) 300 MG 24 hr tablet Take 1 tablet (300 mg total) by mouth daily.  . carvedilol (COREG) 12.5 MG tablet Take 12.5 mg by mouth 2 (two) times daily with a meal.  . felodipine (PLENDIL) 10 MG 24 hr tablet TAKE ONE TABLET BY MOUTH DAILY  . furosemide (LASIX) 40 MG tablet TAKE ONE TABLET BY MOUTH DAILY  . lidocaine (XYLOCAINE) 5 % ointment Apply 1 application topically as needed. Daily for leg pain  . lisinopril (PRINIVIL,ZESTRIL) 20 MG tablet TAKE ONE TABLET BY MOUTH DAILY  . loratadine (CLARITIN) 10 MG tablet Take 10 mg by mouth at bedtime.   . Menthol-Methyl Salicylate (THERA-GESIC) 0.5-15 % CREA Apply 1 application topically daily as needed (pain).  . metFORMIN (GLUCOPHAGE) 1000 MG tablet Take 1 tablet (1,000 mg total) by mouth 2 (two) times daily with a meal.  . mirabegron ER (MYRBETRIQ) 50 MG TB24 tablet Take 50 mg by mouth at bedtime.   . modafinil (PROVIGIL) 200 MG tablet Take 1 tablet (200 mg total) by mouth daily.  . sertraline (ZOLOFT) 100 MG tablet Take 2 tablets (200 mg total) by mouth at bedtime.    . tamsulosin (FLOMAX) 0.4 MG CAPS capsule Take 2 capsules (0.8 mg total) by mouth daily after supper.  . traMADol (ULTRAM) 50 MG tablet Take by mouth every 6 (six) hours as needed.  Loura Pardon Salicylate (ASPERCREME EX) Apply 1 application topically daily as needed (pain).     Allergies:   Ibuprofen, Iodinated diagnostic agents, and Quinolones   Social History   Tobacco Use  .  Smoking status: Former Smoker    Packs/day: 1.00    Years: 13.00    Pack years: 13.00    Types: Cigarettes    Quit date: 01/23/1988    Years since quitting: 32.2  . Smokeless tobacco: Never Used  Vaping Use  . Vaping Use: Never used  Substance Use Topics  . Alcohol use: Yes    Comment: quit 1989  . Drug use: No    Comment: previous cocaine and marijuana use, quit 1988     Family Hx: The patient's He was adopted. Family history is unknown by patient.  ROS   EKGs/Labs/Other Test Reviewed:    EKG:  EKG is   ordered today.  The ekg ordered today demonstrates normal sinus rhythm, HR 70, normal axis, no ST-TW changes, QTc 440, no change from prior tracing.   Recent Labs: No results found for requested labs within last 8760 hours.   Recent Lipid Panel Lab Results  Component Value Date/Time   CHOL 142 03/17/2018 06:30 PM   TRIG 75 03/17/2018 06:30 PM   HDL 44 03/17/2018 06:30 PM   CHOLHDL 3.2 03/17/2018 06:30 PM   CHOLHDL 4.0 12/17/2017 06:39 AM   LDLCALC 83 03/17/2018 06:30 PM    Physical Exam:    VS:  BP (!) 168/80   Pulse 70   Ht 6\' 4"  (1.93 m)   Wt (!) 323 lb 6.4 oz (146.7 kg)   SpO2 97%   BMI 39.37 kg/m     Wt Readings from Last 3 Encounters:  05/02/20 (!) 323 lb 6.4 oz (146.7 kg)  03/28/20 (!) 322 lb (146.1 kg)  10/20/19 (!) 312 lb (141.5 kg)     Constitutional:      Appearance: Healthy appearance. Not in distress.  Neck:     Vascular: JVD normal.  Pulmonary:     Effort: Pulmonary effort is normal.     Breath sounds: No wheezing. No rales.  Cardiovascular:     Normal  rate. Regular rhythm. Normal S1. Normal S2.     Murmurs: There is no murmur.  Edema:    Pretibial: bilateral trace edema of the pretibial area. Abdominal:     Palpations: Abdomen is soft. There is no hepatomegaly.  Skin:    General: Skin is warm and dry.  Neurological:     General: No focal deficit present.     Mental Status: Alert and oriented to person, place and time.     Cranial Nerves: Cranial nerves are intact.      ASSESSMENT & PLAN:    1. Coronary artery disease involving native coronary artery of native heart without angina pectoris Hx of single vessel CABG at the time of his aorta repair in 2013.  He is doing well without angina.  Continue ASA, statin, beta-blocker.    2. Chronic diastolic CHF (congestive heart failure) (HCC) EF 17-49 and mild diastolic dysfunction on echocardiogram 11/2017.  NYHA 2.  Volume status stable.  Continue current dose of Furosemide.    3. History of stroke Due to small vessel disease.  He has residual balance issues and has since retired.    4. Aortic root aneurysm ([place]) 5. S/P aorta repair Stable on CT 12.2020.  Continue follow up with Dr. Servando Snare as directed.    6. Essential hypertension BP above goal.  He took his meds late today. I have asked him to monitor his BP and send me readings for review.    7. Hyperlipidemia, unspecified hyperlipidemia type Recent LDL above goal.  He had been of of Atorvastatin.   He has resumed this and his PCP is checking labs soon. If LDL does not reach goal of < 70, consider referring to Lipid Clinic.  He is unable to tol Rosuvastatin or Ezetimibe.      Dispo:  Return in about 6 months (around 11/02/2020) for Routine Follow Up w/ Dr. Radford Pax, or Richardson Dopp, PA-C, in person.   Medication Adjustments/Labs and Tests Ordered: Current medicines are reviewed at length with the patient today.  Concerns regarding medicines are outlined above.  Tests Ordered: Orders Placed This Encounter  Procedures  . EKG  12-Lead   Medication Changes: No orders of the defined types were placed in this encounter.   Signed, Richardson Dopp, PA-C  05/02/2020 8:53 PM    [facility] [address], [place], [place]  [address] Phone: [phone]; Fax: [phone]

## 2020-05-02 ENCOUNTER — Ambulatory Visit (INDEPENDENT_AMBULATORY_CARE_PROVIDER_SITE_OTHER): Payer: 59 | Admitting: Physician Assistant

## 2020-05-02 ENCOUNTER — Encounter: Payer: Self-pay | Admitting: Physician Assistant

## 2020-05-02 ENCOUNTER — Other Ambulatory Visit: Payer: Self-pay

## 2020-05-02 VITALS — BP 168/80 | HR 70 | Ht 76.0 in | Wt 323.4 lb

## 2020-05-02 DIAGNOSIS — E785 Hyperlipidemia, unspecified: Secondary | ICD-10-CM

## 2020-05-02 DIAGNOSIS — I2510 Atherosclerotic heart disease of native coronary artery without angina pectoris: Secondary | ICD-10-CM | POA: Diagnosis not present

## 2020-05-02 DIAGNOSIS — I719 Aortic aneurysm of unspecified site, without rupture: Secondary | ICD-10-CM | POA: Diagnosis not present

## 2020-05-02 DIAGNOSIS — I7121 Aneurysm of the ascending aorta, without rupture: Secondary | ICD-10-CM

## 2020-05-02 DIAGNOSIS — I5032 Chronic diastolic (congestive) heart failure: Secondary | ICD-10-CM | POA: Diagnosis not present

## 2020-05-02 DIAGNOSIS — Z9889 Other specified postprocedural states: Secondary | ICD-10-CM

## 2020-05-02 DIAGNOSIS — Z8673 Personal history of transient ischemic attack (TIA), and cerebral infarction without residual deficits: Secondary | ICD-10-CM | POA: Diagnosis not present

## 2020-05-02 DIAGNOSIS — I10 Essential (primary) hypertension: Secondary | ICD-10-CM

## 2020-05-02 NOTE — Patient Instructions (Signed)
Medication Instructions:   Your physician recommends that you continue on your current medications as directed. Please refer to the Current Medication list given to you today.  *If you need a refill on your cardiac medications before your next appointment, please call your pharmacy*  Lab Work:  None ordered today  Testing/Procedures:  None ordered today  Follow-Up: At [facility], you and your health needs are our priority.  As part of our continuing mission to provide you with exceptional heart care, we have created designated Provider Care Teams.  These Care Teams include your primary Cardiologist (physician) and Advanced Practice Providers (APPs -  Physician Assistants and Nurse Practitioners) who all work together to provide you with the care you need, when you need it.  We recommend signing up for the patient portal called "MyChart".  Sign up information is provided on this After Visit Summary.  MyChart is used to connect with patients for Virtual Visits (Telemedicine).  Patients are able to view lab/test results, encounter notes, upcoming appointments, etc.  Non-urgent messages can be sent to your provider as well.   To learn more about what you can do with MyChart, go to [email or web address].    Your next appointment:   6 month(s)  The format for your next appointment:   In Person  Provider:   You may see Fransico Him, MD or Richardson Dopp, PA-C  Other Instructions Check your blood pressure every other day for 2 weeks and send readings through my chart.

## 2020-05-09 ENCOUNTER — Telehealth: Payer: Self-pay | Admitting: Physician Assistant

## 2020-05-09 DIAGNOSIS — E785 Hyperlipidemia, unspecified: Secondary | ICD-10-CM

## 2020-05-09 DIAGNOSIS — I2510 Atherosclerotic heart disease of native coronary artery without angina pectoris: Secondary | ICD-10-CM

## 2020-05-09 NOTE — Telephone Encounter (Signed)
Labs from 05/08/2020 Cholesterol, Total 100 - 199 mg/dL 160        Triglycerides 0 - 149 mg/dL 87        HDL >39 mg/dL 46        VLDL Cholesterol Cal 5 - 40 mg/dL 16        LDL 0 - 99 mg/dL 98        LDL/HDL Ratio 0.0 - 3.6 ratio 2.1    LDL remains above goal (goal < 70). He would likely be a good candidate for Bempedoic Acid (Nexletol) to get the LDL down further. PLAN:  1. Refer to [place], [place]    05/09/2020 3:51 PM

## 2020-05-11 NOTE — Telephone Encounter (Signed)
I called and left patient a detailed message with lab results and Scott's recommendations to refer to Lipid Clinic to discuss Nexletol to lower LDL. Ok per patient DPR to leave detailed message, advised patient to call [phone] with any questions about lab results or referral. Referral started.

## 2020-05-14 ENCOUNTER — Telehealth: Payer: Self-pay | Admitting: Pulmonary Disease

## 2020-05-14 NOTE — Telephone Encounter (Signed)
Called pt but there was no answer- LMTCB and will forward to front desk pool per protocol

## 2020-05-14 NOTE — Telephone Encounter (Signed)
Reviewed notes from PCP. Needs new office visit as new consult to reestablish care for OSA

## 2020-05-21 DIAGNOSIS — E785 Hyperlipidemia, unspecified: Secondary | ICD-10-CM

## 2020-05-21 DIAGNOSIS — I2510 Atherosclerotic heart disease of native coronary artery without angina pectoris: Secondary | ICD-10-CM

## 2020-05-21 NOTE — Telephone Encounter (Signed)
Called and left message on pt vm to call back to schedule sleep consult - 2nd attempt - closing message -pr

## 2020-05-22 ENCOUNTER — Encounter: Payer: Self-pay | Admitting: General Practice

## 2020-06-02 ENCOUNTER — Other Ambulatory Visit: Payer: Self-pay | Admitting: Adult Health

## 2020-06-02 DIAGNOSIS — G4733 Obstructive sleep apnea (adult) (pediatric): Secondary | ICD-10-CM

## 2020-06-02 DIAGNOSIS — R5383 Other fatigue: Secondary | ICD-10-CM

## 2020-06-12 NOTE — Telephone Encounter (Signed)
Brief description of triage: wants to know about COVID 19 booster shots. Got the [employer] vaccine.    Triage not needed. Info provided from CDC:  There is not enough data at this time to determine whether immunocompromised people who received the [facility]???s Linwood Dibbles COVID-19 vaccine also have an improved antibody response following an additional dose of the same vaccine.    Care advice provided, patient verbalizes understanding; denies any other questions or concerns; instructed to call back for any new or worsening symptoms.    This triage is a result of a call to [employer]. Please do not respond to the triage nurse through this encounter. Any subsequent communication should be directly with the patient.          Reason for Disposition  ??? COVID-19 vaccine, Frequently Asked Questions (FAQs)    Answer Assessment - Initial Assessment Questions  1. MAIN CONCERN OR SYMPTOM:  "What is your main concern right now?" "What question do you have?" "What's the main symptom you're worried about?" (e.g., fever, pain, redness, swelling)      See note    2. VACCINE: "What vaccination did you receive?" "Is this your first or second shot?" (e.g., none; AstraZeneca, J&J, Moderna, Pfizer, other)      N/A    3. SYMPTOM ONSET: "When did the N/A   begin?" (e.g., not relevant; hours, days)   N/A    4. SYMPTOM SEVERITY: "How bad is it?"       N/A    5. FEVER: "Is there a fever?" If so, ask: "What is it, how was it measured, and when did it start?"       N/A    6. PAST REACTIONS: "Have you reacted to immunizations before?" If so, ask: "What happened?"      N/A    7. OTHER SYMPTOMS: "Do you have any other symptoms?"      N/A    Protocols used: CORONAVIRUS (COVID-19) VACCINE QUESTIONS AND REACTIONS-ADULT-AH

## 2020-06-14 ENCOUNTER — Encounter: Payer: Self-pay | Admitting: Gastroenterology

## 2020-07-04 ENCOUNTER — Other Ambulatory Visit: Payer: Self-pay

## 2020-07-05 ENCOUNTER — Other Ambulatory Visit: Payer: Self-pay

## 2020-07-05 NOTE — Patient Outreach (Addendum)
  [facility] [facility]) Care Management Chronic Special Needs Program    07/05/2020 Late entry for 07/04/20 Name: Jonathan Miles, DOB: 06-23-1961  MRN: [id number]   [name] is enrolled in a chronic special needs plan.  Telephone call to client for Health risk assessment review and initial outreach. Unable to reach. HIPAA compliant voice message left with call back phone number and return call request.   PLAN; RNCM iwll attempt 2nd telephone call to client within 1 month.   Quinn Plowman RN,BSN,CCM [facility] Management [phone]

## 2020-07-05 NOTE — Patient Outreach (Signed)
  [facility]) Care Management Chronic Special Needs Program    07/05/2020  Name: Jonathan Miles, DOB: Nov 30, 1960  MRN: [id number]   [name] is enrolled in a chronic special needs plan. Telephone call to client for Health risk assessment review and initial call outreach. Unable to reach. HIPAA compliant voice message left with call back phone number and return call request.   PLAN; RNCM will attempt 3rd telephone outreach to patient in 1 month  Quinn Plowman RN,BSN,CCM [place] [facility] [phone]

## 2020-07-16 ENCOUNTER — Other Ambulatory Visit: Payer: Self-pay

## 2020-07-16 ENCOUNTER — Encounter: Payer: Self-pay | Admitting: Pulmonary Disease

## 2020-07-16 ENCOUNTER — Ambulatory Visit: Payer: HMO | Admitting: Pulmonary Disease

## 2020-07-16 VITALS — BP 136/72 | HR 67 | Temp 97.5°F | Ht 75.0 in | Wt 311.4 lb

## 2020-07-16 DIAGNOSIS — G4733 Obstructive sleep apnea (adult) (pediatric): Secondary | ICD-10-CM

## 2020-07-16 NOTE — Patient Instructions (Signed)
Will arrange for CPAP titration study and call to schedule follow up after test reviewed

## 2020-07-16 NOTE — Progress Notes (Signed)
[facility]  Chief Complaint  Patient presents with  . Consult    Sleep consult    Constitutional:  BP 136/72 (BP Location: Left Arm, Cuff Size: Normal)   Pulse 67   Temp (!) 97.5 F (36.4 C) (Other (Comment)) Comment (Src): wrist  Ht 6\' 3"  (1.905 m)   Wt (!) 311 lb 6.4 oz (141.3 kg)   SpO2 96% Comment: Room air  BMI 38.92 kg/m   Past Medical History:  ETOH, Dilated thoracic aorta, Asthma, OA, CAD, CHF, Chronic pain, Depression, DM, BPH, GERD, HLD, HTN, CVA  Past Surgical History:  His  has a past surgical history that includes Knee arthroscopy (Left, 2010); Retactment of fingers; Colonscopy; Upper gastrointestinal endoscopy; Coronary artery bypass graft (05/07/2012); laparoscopic appendectomy (N/A, 01/06/2017); Appendectomy (12/2016); and Cholecystectomy (N/A, 11/24/2018).  Brief Summary:  Jonathan Miles is a 59 y.o. male former smoker with obstructive sleep apnea.       Subjective:  He was previously seen by Dr. Corrie Dandy.  He had sleep study in 2018 that showed severe sleep apnea.  He was started on CPAP 11 cm H2O.  Initially did well.  He had several health problems and lost weight.  He noticed have more trouble using CPAP.  Hasn't used in past 3 to 4 months.  His wife says he still snores and will stop breathing at night.  He wakes up frequently to use the bathroom.  He goes to sleep at 11 pm.  He falls asleep in 30 minutes.  He wakes up 2 to 5 times to use the bathroom.  He gets out of bed at 615 am.  He feels tired in the morning.  He denies morning headache.  He does not use anything to help him fall sleep or stay awake.  He denies sleep walking, sleep talking, bruxism, or nightmares.  There is no history of restless legs.  He denies sleep hallucinations, sleep paralysis, or cataplexy.  The Epworth score is 18 out of 24.    Physical Exam:   Appearance - well kempt   ENMT - no sinus tenderness, no oral exudate, no LAN,  Mallampati 3 airway, no stridor, poor dentition  Respiratory - equal breath sounds bilaterally, no wheezing or rales  CV - s1s2 regular rate and rhythm, no murmurs  Ext - no clubbing, no edema  Skin - no rashes  Psych - normal mood and affect   Pulmonary testing:   PFT 05/05/12 >> FEV1 4.40 (111%), FEV1% 86, TLC 7.07 (88%), DLCO 104%  Chest Imaging:   CT angio chest 10/18/19 >> calcified granulomas, ascending thoracic aorta 4.2 cm  Sleep Tests:   PSG 03/09/17 >> AHI 74  CPAP titration 05/31/17 >> CPAP 11 cm H2O  Cardiac Tests:   Echo 12/17/17 >> EF 55 to 60%, grade 1 DD, mild AR, mod/severe LA dilation  Social History:  He  reports that he quit smoking about 32 years ago. His smoking use included cigarettes. He has a 13.00 pack-year smoking history. He has never used smokeless tobacco. He reports current alcohol use. He reports that he does not use drugs.  Family History:  His He was adopted. Family history is unknown by patient.    Discussion:  He has history of severe sleep apnea.  He has noticed more difficulty with CPAP use over the past several months.  He still has sleep disruption, apnea, snoring, and daytime sleepiness.  He is having more fatigue during the day  also.  Has change in his weight since previous sleep study.  Assessment/Plan:   Obstructive sleep apnea. - will arrange for repeat in lab CPAP titration study and then determine what changes he needs to his set up  Daytime sleepiness with hx of depression. - he gets provigil scrip from his PCP  Time Spent Involved in Patient Care on Day of Examination:  32 minutes  Follow up:  There are no Patient Instructions on file for this visit.  Medication List:   Allergies as of 07/16/2020      Reactions   Ibuprofen Other (See Comments)   TOLD NOT TO TAKE-PER CARDS   Iodinated Diagnostic Agents Hives   Hives started 4 days after cta chest was performed, minimal relief w/ benadryl x 3 days, uncertain if  reaction was actually iv contrast or other allergen,allergy tests to follow.wife will make [place] aware of results//[email or web address]   Quinolones Other (See Comments)      Medication List       Accurate as of July 16, 2020 10:50 AM. If you have any questions, ask your nurse or doctor.        acetaminophen 500 MG tablet Commonly known as: TYLENOL TAKE 1-2 TABS EVERY 6 HOURS AS NEEDED FOR PAIN.  Do not take more than 4000 mg of Tylenol per day it can harm your liver.   amitriptyline 25 MG tablet Commonly known as: ELAVIL TAKE ONE TABLET BY MOUTH AT BEDTIME   ASPERCREME EX Apply 1 application topically daily as needed (pain).   aspirin 325 MG EC tablet Take 1 tablet (325 mg total) by mouth at bedtime.   atorvastatin 80 MG tablet Commonly known as: LIPITOR Take 1 tablet (80 mg total) by mouth daily at 6 PM.   buPROPion 300 MG 24 hr tablet Commonly known as: WELLBUTRIN XL Take 1 tablet (300 mg total) by mouth daily.   carvedilol 12.5 MG tablet Commonly known as: COREG Take 12.5 mg by mouth 2 (two) times daily with a meal.   felodipine 10 MG 24 hr tablet Commonly known as: PLENDIL TAKE ONE TABLET BY MOUTH DAILY   furosemide 40 MG tablet Commonly known as: LASIX TAKE ONE TABLET BY MOUTH DAILY   lidocaine 5 % ointment Commonly known as: XYLOCAINE Apply 1 application topically as needed. Daily for leg pain   lisinopril 20 MG tablet Commonly known as: ZESTRIL TAKE ONE TABLET BY MOUTH DAILY   loratadine 10 MG tablet Commonly known as: CLARITIN Take 10 mg by mouth at bedtime.   metFORMIN 1000 MG tablet Commonly known as: GLUCOPHAGE Take 1 tablet (1,000 mg total) by mouth 2 (two) times daily with a meal.   modafinil 200 MG tablet Commonly known as: PROVIGIL TAKE ONE TABLET BY MOUTH DAILY   Myrbetriq 50 MG Tb24 tablet Generic drug: mirabegron ER Take 50 mg by mouth at bedtime.   sertraline 100 MG tablet Commonly known as: ZOLOFT Take 2 tablets (200 mg total) by  mouth at bedtime.   tamsulosin 0.4 MG Caps capsule Commonly known as: FLOMAX Take 2 capsules (0.8 mg total) by mouth daily after supper.   Thera-Gesic 0.5-15 % Crea Apply 1 application topically daily as needed (pain).   traMADol 50 MG tablet Commonly known as: ULTRAM Take by mouth every 6 (six) hours as needed.       Signature:  Chesley Mires, MD [facility] Pager - [phone] 07/16/2020, 10:50 AM

## 2020-07-17 ENCOUNTER — Other Ambulatory Visit: Payer: Self-pay

## 2020-07-17 NOTE — Patient Outreach (Signed)
[facility]) Care Management Chronic Special Needs Program    07/17/2020  Name: Jonathan Miles, DOB: 10-03-1961  MRN: [id number]   [name] is enrolled in a chronic special needs plan for Diabetes.  Telephone call to client to review health risk assessment and for initial outreach call.  Unable to reach. HIPAA compliant voice message left with call back phone number and return call request. Individual care plan developed based on clients completed Health risk assessment and available data.   Goals    .  Acknowledge receipt of Advanced Directive package     RN case manager will send client Advanced directive packet.     . Client understands the importance of follow-up with providers by attending scheduled visits     Primary care provider visit 05/08/20 and 02/07/20 Cardiology visit 05/08/20 Follow up with your doctors as recommended.     . Client verbalize Knowledge of heart failure disease self management skills in 6 months     RN case manager will send client education article:  Heart failure, Adult and low salt diet.  Review [employer] calendar sent in the mail for Heart failure information Take your medications as prescribed Follow up with your doctor as recommended.     . Client will not report change from baseline and no repeated symptoms of stroke with in the next 6 months     Know the signs and symptoms to prevent another stroke: Facial drooping (unable to smile), Arm weakness (one arm week or numb), Speech is difficult (slurred, unable to speak). Call 911, this is an emergency! Get to a hospital immediately RN case manager will send client education article: Stroke     . Client will report abillity to obtain Medications within 6 months     RN case manager will refer client to pharmacist for medication assistance.       . Client will report improved coping within 6 months     RN case manager will send client education article: Helping you  cope Coping strategies: -get enough good quality sleep -eat a well- balanced diet -exercise on a regular basis -Take vacations away from home and work -engage in pleasurable or fun activites every day -avoid use of caffeine and alcohol -practice relaxation exercises     . Client will report no fall or injuries in the next 6 months.     RN case manager will send client education article:  Preventing falls    . Client will report no worsening of symptoms of Atrial Fibrillation within the next 6 months     Review [employer] calendar sent in the mail for Afib information and action plan Call your provider if you have a racing or irregular heart beat that may be uncomfortable, shortness of breath with or without chest pain, weakness and dizziness RN case manager will send client education article: Atrial fibrillation    . Client will report no worsening of symptoms related to heart disease within the next 6 months     RN case manager will send client education article: Heart disease in adults.  Notify provider for symptoms of chest pain, sweating, nausea/ vomiting, irregular heartbeat, palpitations, rapid heart rate, shortness of breath or dizziness or fainting. Call 911 for severe symptoms of chest pain or shortness of breath Take medications as prescribed Follow a low salt meal plan, limit or avoid drinks with alcohol.    . Client will verbalize knowledge of self management of Hypertension  as evidences by BP reading of 140/90 or less; or as defined by provider     . Do not skip doses of blood pressure medicine.  The medicine does not work as well if you skip doses. Skipping doses also puts you at risk for problems.  . Ask your doctor about side effects or reactions to medicines that you should watch for.  . Take B/P medications as ordered. Some may cause you to use the bathroom more. . Plan to eat low salt and heart healthy meals full of fruits, vegetables, whole grains, lean  protein and limit fat, and sugars. . Increase activity as tolerated. RN case management will send client education article to: High Blood pressure in adults.     . Client/Caregiver will verbalize understanding of instructions related to self-care and safety     RN case manager will send client education article: Helping you cope and preventing falls      . HEMOGLOBIN A1C < 7     Discussed diabetes self management actions:  Glucose monitoring per provider recommendation  Check feet daily  Visit provider every 3-6 months as directed  Hbg A1C level every 3-6 months.  Eye Exam yearly  Carbohydrate controlled meal planning  Taking diabetes medication as prescribed by provider  Physical activity     . Maintain timely refills of diabetic medication as prescribed within the year .     It is important to take your medications as prescribed Please contact your RN case manager if you are unable to obtain your medication     . Obtain annual  Lipid Profile, LDL-C     Your last lipid profile was 03/17/18 The goal for LDL is less than 70 mg/ ld as you are at high risk for complications Try to avoid saturated fats, trans-fats and eat more fiber RN case manager will send client education article on heart healthy diet and     . Obtain Annual Eye (retinal)  Exam      Your last documented eye exam was 05/10/18 Diabetes can affect your vision.  Plan to have a dilated eye exam every year.       . Obtain Annual Foot Exam     Your last documented foot exam was 03/17/18 It is important that your doctor check your feet regularly.  Discuss this with your doctor at your next visit.  Diabetes can affect the nerves in your feet, causing decreased feeling or numbness.  Diabetes foot care - Check feet daily at home (look for skin color changes, cuts, sores or cracks in the skin, swelling of feet or ankles, ingrown or fungal toenails, corn or calluses). Report these findings to your doctor - Wash feet  with soap and water, dry feet well especially between toes - Moisturize your feet but not between the toes - Always wear shoes that protect your whole feet.      . Obtain annual screen for micro albuminuria (urine) , nephropathy (kidney problems)     Your last documented foot exam was 03/17/18 Diabetes can affect your kidneys It is important for your doctor to check your urine at least once a year.  These tests show how your kidneys are working RN case manager will send client education article: Urine Albumin    . Obtain Hemoglobin A1C at least 2 times per year     Hgb A1c 03/17/18 Your last documented A1c was 5.8.  Have your Hgb A1c checked every 6 months If you are at  goal or every 3 months if you are not at goal.      . Visit Primary Care Provider or Endocrinologist at least 2 times per year      Primary care provider visit 02/07/20 and 05/08/20 Continue to follow up with your providers as recommended       PLAN;  RNCM will send letter to client and primary care provider. RNCM will send education articles to client RNCM will follow up with client in 6 months.  RN case manager will refer client to pharmacist for medication assistance.   Quinn Plowman RN,BSN,CCM [facility] [phone]

## 2020-07-18 ENCOUNTER — Ambulatory Visit: Payer: Self-pay

## 2020-07-19 ENCOUNTER — Encounter: Payer: Self-pay | Admitting: Adult Health

## 2020-07-23 MED ORDER — ARMODAFINIL 150 MG PO TABS: 150.0000 mg | ORAL_TABLET | Freq: Every day | ORAL | 5 refills | Status: DC

## 2020-08-07 ENCOUNTER — Ambulatory Visit (AMBULATORY_SURGERY_CENTER): Payer: Self-pay | Admitting: *Deleted

## 2020-08-07 ENCOUNTER — Other Ambulatory Visit: Payer: Self-pay

## 2020-08-07 VITALS — Ht 75.0 in | Wt 310.0 lb

## 2020-08-07 DIAGNOSIS — Z1211 Encounter for screening for malignant neoplasm of colon: Secondary | ICD-10-CM

## 2020-08-07 MED ORDER — SUTAB 1479-225-188 MG PO TABS
24.0000 | ORAL_TABLET | ORAL | 0 refills | Status: DC
Start: 2020-08-07 — End: 2021-02-18

## 2020-08-07 NOTE — Progress Notes (Signed)
Covid test 10-21 930 am [place]   No egg or soy allergy known to patient  No issues with past sedation with any surgeries or procedures no intubation problems in the past  No FH of Malignant Hyperthermia No diet pills per patient No home 02 use per patient  No blood thinners per patient  Pt denies issues with constipation  No A fib or A flutter  EMMI video to pt or via [place] 19 guidelines implemented in [facility] today with Pt and RN   sutab sample given to pt in [facility] today , LOT [id number] EXP 04/23  Due to the COVID-19 pandemic we are asking patients to follow these guidelines. Please only bring one care partner. Please be aware that your care partner may wait in the car in the parking lot or if they feel like they will be too hot to wait in the car, they may wait in the lobby on the 4th floor. All care partners are required to wear a mask the entire time (we do not have any that we can provide them), they need to practice social distancing, and we will do a Covid check for all patient's and care partners when you arrive. Also we will check their temperature and your temperature. If the care partner waits in their car they need to stay in the parking lot the entire time and we will call them on their cell phone when the patient is ready for discharge so they can bring the car to the front of the building. Also all patient's will need to wear a mask into building. \

## 2020-08-12 ENCOUNTER — Other Ambulatory Visit: Payer: Self-pay

## 2020-08-12 ENCOUNTER — Ambulatory Visit (INDEPENDENT_AMBULATORY_CARE_PROVIDER_SITE_OTHER): Payer: HMO

## 2020-08-12 ENCOUNTER — Ambulatory Visit (HOSPITAL_COMMUNITY)
Admission: EM | Admit: 2020-08-12 | Discharge: 2020-08-12 | Disposition: A | Discharge status: Home / Self Care | Payer: HMO | Attending: Emergency Medicine | Admitting: Emergency Medicine

## 2020-08-12 DIAGNOSIS — G4733 Obstructive sleep apnea (adult) (pediatric): Secondary | ICD-10-CM | POA: Diagnosis not present

## 2020-08-12 DIAGNOSIS — J111 Influenza due to unidentified influenza virus with other respiratory manifestations: Secondary | ICD-10-CM

## 2020-08-12 DIAGNOSIS — I2510 Atherosclerotic heart disease of native coronary artery without angina pectoris: Secondary | ICD-10-CM | POA: Diagnosis not present

## 2020-08-12 DIAGNOSIS — R059 Cough, unspecified: Secondary | ICD-10-CM | POA: Diagnosis not present

## 2020-08-12 DIAGNOSIS — Z7984 Long term (current) use of oral hypoglycemic drugs: Secondary | ICD-10-CM | POA: Diagnosis not present

## 2020-08-12 DIAGNOSIS — I110 Hypertensive heart disease with heart failure: Secondary | ICD-10-CM | POA: Diagnosis not present

## 2020-08-12 DIAGNOSIS — Z7982 Long term (current) use of aspirin: Secondary | ICD-10-CM | POA: Insufficient documentation

## 2020-08-12 DIAGNOSIS — Z951 Presence of aortocoronary bypass graft: Secondary | ICD-10-CM | POA: Diagnosis not present

## 2020-08-12 DIAGNOSIS — Z87891 Personal history of nicotine dependence: Secondary | ICD-10-CM | POA: Insufficient documentation

## 2020-08-12 DIAGNOSIS — Z8673 Personal history of transient ischemic attack (TIA), and cerebral infarction without residual deficits: Secondary | ICD-10-CM | POA: Insufficient documentation

## 2020-08-12 DIAGNOSIS — J449 Chronic obstructive pulmonary disease, unspecified: Secondary | ICD-10-CM | POA: Diagnosis not present

## 2020-08-12 DIAGNOSIS — Z886 Allergy status to analgesic agent status: Secondary | ICD-10-CM | POA: Insufficient documentation

## 2020-08-12 DIAGNOSIS — I5032 Chronic diastolic (congestive) heart failure: Secondary | ICD-10-CM | POA: Insufficient documentation

## 2020-08-12 DIAGNOSIS — E119 Type 2 diabetes mellitus without complications: Secondary | ICD-10-CM | POA: Insufficient documentation

## 2020-08-12 DIAGNOSIS — Z9049 Acquired absence of other specified parts of digestive tract: Secondary | ICD-10-CM | POA: Insufficient documentation

## 2020-08-12 DIAGNOSIS — Z79899 Other long term (current) drug therapy: Secondary | ICD-10-CM | POA: Insufficient documentation

## 2020-08-12 DIAGNOSIS — E785 Hyperlipidemia, unspecified: Secondary | ICD-10-CM | POA: Insufficient documentation

## 2020-08-12 DIAGNOSIS — F32A Depression, unspecified: Secondary | ICD-10-CM | POA: Diagnosis not present

## 2020-08-12 DIAGNOSIS — R509 Fever, unspecified: Secondary | ICD-10-CM | POA: Diagnosis not present

## 2020-08-12 DIAGNOSIS — R519 Headache, unspecified: Secondary | ICD-10-CM | POA: Diagnosis present

## 2020-08-12 DIAGNOSIS — U071 COVID-19: Secondary | ICD-10-CM | POA: Diagnosis not present

## 2020-08-12 MED ORDER — ACETAMINOPHEN 325 MG PO TABS
ORAL_TABLET | ORAL | Status: AC
  Filled 2020-08-12: qty 2

## 2020-08-12 MED ORDER — ACETAMINOPHEN 325 MG PO TABS
650.0000 mg | ORAL_TABLET | Freq: Once | ORAL | Status: AC
  Administered 2020-08-12: 650 mg via ORAL

## 2020-08-12 NOTE — ED Notes (Signed)
Pt took 1000 mg tylenol @ 7416 today.

## 2020-08-12 NOTE — ED Provider Notes (Signed)
[place]    CSN: [id number] Arrival date & time: 08/12/20  1624      History   Chief Complaint Chief Complaint  Patient presents with  . Headache  . Dizziness    HPI Jonathan Miles is a 59 y.o. male.   Jonathan Miles presents with complaints of headaches, nasal drainage, coughs, hortness of breath, fatigue. Ill feeling. "flu symptoms."  Dizziness. Symptoms started two days ago. No known ill contacts. No gi symptoms. Decreased appetite. Cough has been productive today. prevoiusly had been dry. Chest pain from cough.  No history of covid-19 and has not received vaccination. Has been taking tylenol, last at 1245 today, which has helped with his symptoms somewhat. Poor po intake.    ROS per HPI, negative if not otherwise mentioned.      Past Medical History:  Diagnosis Date  . Alcohol abuse   . Allergy   . Anxiety   . Aortic root dilatation (HCC)    a. echo 4/12: EF 55-60%, mod to marked dilated ascending aortic root;   b. MRA 4/12: Aortic root 5.5 cm, dilation of left and right coronary cusps, trileaflet aortic valve // Echo 2/19: Moderate LVH, EF 55-60, normal wall motion, grade 1 diastolic dysfunction, mild AI, mildly dilated aortic root, moderate to severe LAE (aortic root 40 mm; ascending aorta 39 mm)   . Arthritis   . Asthma    ast attack in early 20's  . CAD (coronary artery disease)   . Cancer [facility])    prostate cancer  . Chest pain    Myoview 4/12: EF 62%, no ischemia or scar  . CHF (congestive heart failure) ([place])   . Chronic pain    back and hands  . Constipation   . COPD (chronic obstructive pulmonary disease) ([place])   . Depression   . Diabetes mellitus    type 2  . Difficulty speaking   . Drug use   . Enlarged prostate   . Fatigue   . GERD (gastroesophageal reflux disease)    modifies with diet  . Hx of echocardiogram    Echo (2/16):  Mod LVH, EF 55-60%, mild to mod AI, mod LAE, mild RAE, Aortic root 38 mm, Asc aorta 43 mm  .  Hyperlipidemia   . Hypertension   . Joint pain   . Knee pain, bilateral   . Leg pain    ABIs 3/19: Normal (R 1.3; L 1.28)  . Neuromuscular disorder ([place])    left foot ? neuropathy   . OSA (obstructive sleep apnea) 2006   .  Wears at times  . Osteoarthritis   . Palpitations   . Sleep apnea    wears cpap  but having sleep study 08-16-2020  . Stroke [facility]) 2018    Patient Active Problem List   Diagnosis Date Noted  . Acute cholecystitis 11/23/2018  . Late effect of stroke 12/26/2017  . History of stroke 12/16/2017  . BMI 36.0-36.9,adult 08/05/2017  . Cholelithiasis 08/05/2017  . Bilateral plantar fasciitis 08/05/2017  . Situational mixed anxiety and depressive disorder 08/05/2017  . Bilateral primary osteoarthritis of knee 07/01/2017  . Asthma 01/26/2017  . Overactive bladder 01/21/2017  . BPH with obstruction/lower urinary tract symptoms 01/21/2017  . S/P appendectomy 01/07/2017  . Diverticulosis 11/24/2016  . Type 2 diabetes mellitus without complication, without long-term current use of insulin ([place]) 10/31/2016  . Aortic insufficiency 11/23/2012  . Chronic diastolic CHF (congestive heart failure) ([place]) 11/23/2012  . CAD (coronary artery disease)  11/27/2011  . OSA (obstructive sleep apnea) 11/11/2011  . Aortic root dilatation ([place])   . Hyperlipidemia 01/23/2011  . Hypertension 01/23/2011    Past Surgical History:  Procedure Laterality Date  . APPENDECTOMY  12/2016  . CHOLECYSTECTOMY N/A 11/24/2018   Procedure: LAPAROSCOPIC CHOLECYSTECTOMY;  Surgeon: Stark Klein, MD;  Location: [facility];  Service: General;  Laterality: N/A;  . COLONOSCOPY    . Colonscopy    . CORONARY ARTERY BYPASS GRAFT  05/07/2012   Procedure: CORONARY ARTERY BYPASS GRAFTING (CABG);  Surgeon: Grace Isaac, MD;  Location: [facility];  Service: Open Heart Surgery;  Laterality: N/A;  . KNEE ARTHROSCOPY Left 2010   Dr. Rosamaria Lints  . LAPAROSCOPIC APPENDECTOMY N/A 01/06/2017   Procedure: APPENDECTOMY  LAPAROSCOPIC;  Surgeon: Ralene Ok, MD;  Location: [facility];  Service: General;  Laterality: N/A;  . Retactment of fingers     as a child , sewed with sewing machine- Index and middle finger  . UPPER GASTROINTESTINAL ENDOSCOPY         Home Medications    Prior to Admission medications   Medication Sig Start Date End Date Taking? Authorizing Provider  acetaminophen (TYLENOL) 500 MG tablet TAKE 1-2 TABS EVERY 6 HOURS AS NEEDED FOR PAIN.  Do not take more than 4000 mg of Tylenol per day it can harm your liver. 11/26/18  Yes Earnstine Regal, PA-C  amitriptyline (ELAVIL) 25 MG tablet TAKE ONE TABLET BY MOUTH AT BEDTIME 03/01/20  Yes McCue, Janett Billow, NP  Armodafinil 150 MG tablet Take 1 tablet (150 mg total) by mouth daily. 07/23/20  Yes Frann Rider, NP  aspirin 325 MG EC tablet Take 1 tablet (325 mg total) by mouth at bedtime. 02/20/18  Yes Jeffery, Chelle, PA  atorvastatin (LIPITOR) 80 MG tablet Take 1 tablet (80 mg total) by mouth daily at 6 PM. 01/21/18  Yes Jeffery, Chelle, PA  buPROPion (WELLBUTRIN XL) 300 MG 24 hr tablet Take 1 tablet (300 mg total) by mouth daily. 09/27/19  Yes McCue, Janett Billow, NP  carvedilol (COREG) 12.5 MG tablet Take 12.5 mg by mouth 2 (two) times daily with a meal.   Yes [provider]  felodipine (PLENDIL) 10 MG 24 hr tablet TAKE ONE TABLET BY MOUTH DAILY 11/03/17  Yes Jeffery, Chelle, PA  furosemide (LASIX) 40 MG tablet TAKE ONE TABLET BY MOUTH DAILY 11/03/17  Yes Jeffery, Chelle, PA  lidocaine (XYLOCAINE) 5 % ointment Apply 1 application topically as needed. Daily for leg pain 01/23/18  Yes Jeffery, Chelle, PA  lisinopril (PRINIVIL,ZESTRIL) 20 MG tablet TAKE ONE TABLET BY MOUTH DAILY 11/03/17  Yes Jeffery, Chelle, PA  loratadine (CLARITIN) 10 MG tablet Take 10 mg by mouth at bedtime.    Yes [provider]  Menthol-Methyl Salicylate (THERA-GESIC) 0.5-15 % CREA Apply 1 application topically daily as needed (pain).   Yes [provider]  metFORMIN  (GLUCOPHAGE) 1000 MG tablet Take 1 tablet (1,000 mg total) by mouth 2 (two) times daily with a meal. 01/21/18  Yes Jeffery, Chelle, PA  sertraline (ZOLOFT) 100 MG tablet Take 2 tablets (200 mg total) by mouth at bedtime. 03/28/20  Yes McCue, Janett Billow, NP  tamsulosin (FLOMAX) 0.4 MG CAPS capsule Take 2 capsules (0.8 mg total) by mouth daily after supper. 09/15/17  Yes Jeffery, Chelle, PA  traMADol (ULTRAM) 50 MG tablet Take by mouth every 6 (six) hours as needed.   Yes [provider]  Trolamine Salicylate (ASPERCREME EX) Apply 1 application topically daily as needed (pain).   Yes  [provider]  TURMERIC PO Take by mouth.   Yes [provider]  mirabegron ER (MYRBETRIQ) 50 MG TB24 tablet Take 50 mg by mouth at bedtime.     [provider]  Sodium Sulfate-Mag Sulfate-KCl (SUTAB) [phone] MG TABS Take 24 tablets by mouth as directed. 08/07/20   Armbruster, Carlota Raspberry, MD    Family History Family History  Adopted: Yes  Family history unknown: Yes    Social History Social History   Tobacco Use  . Smoking status: Former Smoker    Packs/day: 1.00    Years: 13.00    Pack years: 13.00    Types: Cigarettes    Quit date: 01/23/1988    Years since quitting: 32.5  . Smokeless tobacco: Never Used  Vaping Use  . Vaping Use: Never used  Substance Use Topics  . Alcohol use: Not Currently    Comment: quit 1989  . Drug use: No    Comment: previous cocaine and marijuana use, quit 1988     Allergies   Ibuprofen, Iodinated diagnostic agents, and Quinolones   Review of Systems Review of Systems   Physical Exam Triage Vital Signs ED Triage Vitals  Enc Vitals Group     BP 08/12/20 1751 135/73     Pulse Rate 08/12/20 1751 68     Resp 08/12/20 1751 20     Temp 08/12/20 1751 100.3 F (37.9 C)     Temp Source 08/12/20 1751 Oral     SpO2 08/12/20 1751 97 %     Weight 08/12/20 1755 (!) 310 lb (140.6 kg)     Height 08/12/20 1755 6\' 3"  (1.905 m)     Head  Circumference --      Peak Flow --      Pain Score 08/12/20 1754 6     Pain Loc --      Pain Edu? --      Excl. in [place]? --    No data found.  Updated Vital Signs BP 135/73 (BP Location: Right Arm)   Pulse 68   Temp 100.3 F (37.9 C) (Oral)   Resp 20   Ht 6\' 3"  (1.905 m)   Wt (!) 310 lb (140.6 kg)   SpO2 97%   BMI 38.75 kg/m   Visual Acuity Right Eye Distance:   Left Eye Distance:   Bilateral Distance:    Right Eye Near:   Left Eye Near:    Bilateral Near:     Physical Exam Constitutional:      General: He is not in acute distress.    Appearance: He is well-developed. He is ill-appearing. He is not toxic-appearing.  Eyes:     Extraocular Movements: Extraocular movements intact.  Cardiovascular:     Rate and Rhythm: Normal rate.  Pulmonary:     Effort: Pulmonary effort is normal.     Breath sounds: Normal breath sounds.  Skin:    General: Skin is warm and dry.  Neurological:     Mental Status: He is alert and oriented to person, place, and time.  Psychiatric:        Mood and Affect: Mood normal.      UC Treatments / Results  Labs (all labs ordered are listed, but only abnormal results are displayed) Labs Reviewed  RESP PANEL BY RT PCR (RSV, FLU A&B, COVID) - Abnormal; Notable for the following components:      Result Value   SARS Coronavirus 2 by RT PCR POSITIVE (*)  All other components within normal limits    EKG   Radiology DG Chest 2 View  Result Date: 08/12/2020 CLINICAL DATA:  Fever, cough fever, cough EXAM: CHEST - 2 VIEW COMPARISON:  12/16/2017 FINDINGS: Heart and mediastinal contours are within normal limits. No focal opacities or effusions. No acute bony abnormality. Prior median sternotomy. IMPRESSION: No active cardiopulmonary disease. Electronically Signed   By: Rolm Baptise M.D.   On: 08/12/2020 19:47    Procedures Procedures (including critical care time)  Medications Ordered in UC Medications  acetaminophen (TYLENOL) tablet  650 mg (650 mg Oral Given 08/12/20 1844)    Initial Impression / Assessment and Plan / UC Course  I have reviewed the triage vital signs and the nursing notes.  Pertinent labs & imaging results that were available during my care of the patient were reviewed by me and considered in my medical decision making (see chart for details).     Lungs clear and cxr without acute findings. Febrile. Suspicious for covid-19, not vaccinated. Supportive cares recommended at this time. If positive will meet criteria for MAB and discussed this with patient. Return precautions provided. Patient verbalized understanding and agreeable to plan.   Final Clinical Impressions(s) / UC Diagnoses   Final diagnoses:  Influenza-like illness     Discharge Instructions     Your chest xray is clear tonight which is reassuring.  I am concerned about covid-19 due to your symptoms.  Push fluids to ensure adequate hydration and keep secretions thin.  Tylenol as needed for pain or fevers.  You may try some vitamins to help your immune system potentially:  Vitamin C 500mg  twice a day. Zinc 50mg  daily. Vitamin D 5000IU daily.   Self isolate until covid results are back and negative. We are also testing you for influenza.  Will notify you by phone of any positive findings. Your negative results will be sent through your MyChart.     Please return for any worsening of symptoms, please follow up with your PCP if symptoms persist.     ED Prescriptions    None     PDMP not reviewed this encounter.   Zigmund Gottron, NP 08/13/20 1535

## 2020-08-12 NOTE — Discharge Instructions (Signed)
Your chest xray is clear tonight which is reassuring.  I am concerned about covid-19 due to your symptoms.  Push fluids to ensure adequate hydration and keep secretions thin.  Tylenol as needed for pain or fevers.  You may try some vitamins to help your immune system potentially:  Vitamin C 500mg  twice a day. Zinc 50mg  daily. Vitamin D 5000IU daily.   Self isolate until covid results are back and negative. We are also testing you for influenza.  Will notify you by phone of any positive findings. Your negative results will be sent through your MyChart.     Please return for any worsening of symptoms, please follow up with your PCP if symptoms persist.

## 2020-08-12 NOTE — ED Triage Notes (Signed)
Pt reports he has been sick since Friday . Has a HA,Fever ( 100.8 @ home) cough. Pt in [place] to room because he reported to feel dizzy.

## 2020-08-13 LAB — RESP PANEL BY RT PCR (RSV, FLU A&B, COVID)
Influenza A by PCR: NEGATIVE
Influenza B by PCR: NEGATIVE
Respiratory Syncytial Virus by PCR: NEGATIVE
SARS Coronavirus 2 by RT PCR: POSITIVE — AB

## 2020-08-14 ENCOUNTER — Encounter: Payer: Self-pay | Admitting: Nurse Practitioner

## 2020-08-14 ENCOUNTER — Telehealth: Payer: Self-pay | Admitting: Nurse Practitioner

## 2020-08-14 DIAGNOSIS — U071 COVID-19: Secondary | ICD-10-CM

## 2020-08-14 NOTE — Telephone Encounter (Signed)
Called to Discuss with patient about Covid symptoms and the use of regeneron, a monoclonal antibody infusion for those with mild to moderate Covid symptoms and at a high risk of hospitalization.     Pt is qualified for this infusion at the [facility] infusion center due to co-morbid conditions and/or a member of an at-risk group.     Unable to reach pt. Left message to return call. Sent mychart message.   Benedetto Ryder, DNP, AGNP-C [phone] (Infusion Center Hotline)  

## 2020-08-15 ENCOUNTER — Telehealth: Payer: Self-pay | Admitting: Physician Assistant

## 2020-08-15 ENCOUNTER — Other Ambulatory Visit: Payer: Self-pay | Admitting: Physician Assistant

## 2020-08-15 ENCOUNTER — Ambulatory Visit (HOSPITAL_COMMUNITY)
Admission: RE | Admit: 2020-08-15 | Discharge: 2020-08-15 | Disposition: A | Discharge status: Home / Self Care | Payer: Medicare Other | Source: Ambulatory Visit | Attending: Pulmonary Disease | Admitting: Pulmonary Disease

## 2020-08-15 DIAGNOSIS — Z8673 Personal history of transient ischemic attack (TIA), and cerebral infarction without residual deficits: Secondary | ICD-10-CM

## 2020-08-15 DIAGNOSIS — I10 Essential (primary) hypertension: Secondary | ICD-10-CM

## 2020-08-15 DIAGNOSIS — I2510 Atherosclerotic heart disease of native coronary artery without angina pectoris: Secondary | ICD-10-CM

## 2020-08-15 DIAGNOSIS — U071 COVID-19: Secondary | ICD-10-CM

## 2020-08-15 DIAGNOSIS — I5032 Chronic diastolic (congestive) heart failure: Secondary | ICD-10-CM

## 2020-08-15 DIAGNOSIS — Z23 Encounter for immunization: Secondary | ICD-10-CM | POA: Diagnosis not present

## 2020-08-15 DIAGNOSIS — Z9889 Other specified postprocedural states: Secondary | ICD-10-CM | POA: Diagnosis present

## 2020-08-15 MED ORDER — FAMOTIDINE IN NACL 20-0.9 MG/50ML-% IV SOLN: 20.0000 mg | Freq: Once | INTRAVENOUS | Status: DC | PRN

## 2020-08-15 MED ORDER — METHYLPREDNISOLONE SODIUM SUCC 125 MG IJ SOLR: 125.0000 mg | Freq: Once | INTRAMUSCULAR | Status: DC | PRN

## 2020-08-15 MED ORDER — ALBUTEROL SULFATE HFA 108 (90 BASE) MCG/ACT IN AERS: 2.0000 | INHALATION_SPRAY | Freq: Once | RESPIRATORY_TRACT | Status: DC | PRN

## 2020-08-15 MED ORDER — EPINEPHRINE 0.3 MG/0.3ML IJ SOAJ: 0.3000 mg | Freq: Once | INTRAMUSCULAR | Status: DC | PRN

## 2020-08-15 MED ORDER — DIPHENHYDRAMINE HCL 50 MG/ML IJ SOLN: 50.0000 mg | Freq: Once | INTRAMUSCULAR | Status: DC | PRN

## 2020-08-15 MED ORDER — ACETAMINOPHEN 325 MG PO TABS
650.0000 mg | ORAL_TABLET | Freq: Once | ORAL | Status: AC
  Administered 2020-08-15: 650 mg via ORAL
  Filled 2020-08-15: qty 2

## 2020-08-15 MED ORDER — SODIUM CHLORIDE 0.9 % IV SOLN: INTRAVENOUS | Status: DC | PRN

## 2020-08-15 MED ORDER — SODIUM CHLORIDE 0.9 % IV SOLN: Freq: Once | INTRAVENOUS | Status: AC

## 2020-08-15 NOTE — Discharge Instructions (Signed)

## 2020-08-15 NOTE — Progress Notes (Signed)
I connected by phone with Jonathan Miles on 08/15/2020 at 8:43 AM to discuss the potential use of a new treatment for mild to moderate COVID-19 viral infection in non-hospitalized patients.  This patient is a 59 y.o. male that meets the FDA criteria for Emergency Use Authorization of COVID monoclonal antibody casirivimab/imdevimab or bamlamivimab/estevimab.  Has a (+) direct SARS-CoV-2 viral test result  Has mild or moderate COVID-19   Is NOT hospitalized due to COVID-19  Is within 10 days of symptom onset  Has at least one of the high risk factor(s) for progression to severe COVID-19 and/or hospitalization as defined in EUA.  Specific high risk criteria : BMI > 25, Diabetes and Cardiovascular disease or hypertension   I have spoken and communicated the following to the patient or parent/caregiver regarding COVID monoclonal antibody treatment:  1. FDA has authorized the emergency use for the treatment of mild to moderate COVID-19 in adults and pediatric patients with positive results of direct SARS-CoV-2 viral testing who are 18 years of age and older weighing at least 40 kg, and who are at high risk for progressing to severe COVID-19 and/or hospitalization.  2. The significant known and potential risks and benefits of COVID monoclonal antibody, and the extent to which such potential risks and benefits are unknown.  3. Information on available alternative treatments and the risks and benefits of those alternatives, including clinical trials.  4. Patients treated with COVID monoclonal antibody should continue to self-isolate and use infection control measures (e.g., wear mask, isolate, social distance, avoid sharing personal items, clean and disinfect "high touch" surfaces, and frequent handwashing) according to CDC guidelines.   5. The patient or parent/caregiver has the option to accept or refuse COVID monoclonal antibody treatment.  After reviewing this information with the patient,  the patient has agreed to receive one of the available covid 19 monoclonal antibodies and will be provided an appropriate fact sheet prior to infusion.  Sx onset 10/15. Set up for infusion on 10/20 @ 10:30am. D[facility] given to [facility]. Pt is aware that insurance will be charged an infusion fee. Pt is unvaccinated.   Angelena Form 08/15/2020 8:43 AM

## 2020-08-15 NOTE — Telephone Encounter (Signed)
Called to discuss with patient about Covid symptoms and the use of bamlanivimab/etesevimab or casirivimab/imdevimab, a monoclonal antibody infusion for those with mild to moderate Covid symptoms and at a high risk of hospitalization.  Pt is qualified for this infusion at the [facility] infusion center due to; Specific high risk criteria : BMI > 25, Diabetes and Cardiovascular disease or hypertension   Message left to call back our hotline [phone]. I also sent him a text message letting him know that PCP does not place orders.  Angelena Form PA-C  [facility]

## 2020-08-16 ENCOUNTER — Encounter (HOSPITAL_BASED_OUTPATIENT_CLINIC_OR_DEPARTMENT_OTHER): Payer: HMO | Admitting: Internal Medicine

## 2020-08-20 ENCOUNTER — Encounter: Payer: 59 | Admitting: Gastroenterology

## 2020-08-24 ENCOUNTER — Other Ambulatory Visit: Payer: 59

## 2020-08-28 ENCOUNTER — Other Ambulatory Visit: Payer: Self-pay | Admitting: Adult Health

## 2020-09-03 ENCOUNTER — Other Ambulatory Visit: Payer: Self-pay

## 2020-09-03 ENCOUNTER — Other Ambulatory Visit: Payer: HMO | Admitting: *Deleted

## 2020-09-03 DIAGNOSIS — E785 Hyperlipidemia, unspecified: Secondary | ICD-10-CM

## 2020-09-03 DIAGNOSIS — I2510 Atherosclerotic heart disease of native coronary artery without angina pectoris: Secondary | ICD-10-CM

## 2020-09-03 LAB — LIPID PANEL
Chol/HDL Ratio: 3.2 ratio (ref 0.0–5.0)
Cholesterol, Total: 146 mg/dL (ref 100–199)
HDL: 45 mg/dL (ref >39)
LDL Chol Calc (NIH): 79 mg/dL (ref 0–99)
Triglycerides: 120 mg/dL (ref 0–149)
VLDL Cholesterol Cal: 22 mg/dL (ref 5–40)

## 2020-09-05 ENCOUNTER — Telehealth: Payer: Self-pay

## 2020-09-05 NOTE — Telephone Encounter (Signed)
-----   Message from Liliane Shi, [place] sent at 09/04/2020  8:06 AM EST ----- LDL is improved but still above goal.  Goal is < 70.  To reduce future CV risk, he would benefit from lowering the LDL more.   PLAN:  - If he is willing, refer to [facility] for consideration of alternate Rx (Bempedoic Acid or [id number]).  Otherwise, continue current Rx.  Richardson Dopp, PA-C    09/04/2020 7:55 AM

## 2020-09-05 NOTE — Telephone Encounter (Signed)
Attempted phone call to pt.  Per chart, OK to leave detailed voicemail message.  Per Richardson Dopp, PA-C, requested pt contact [facility] at [phone] to schedule appointment with pharmacist [facility] if desired.

## 2020-09-15 ENCOUNTER — Ambulatory Visit: Admit: 2020-09-15 | Discharge: 2020-09-15 | Payer: PRIVATE HEALTH INSURANCE | Attending: Family

## 2020-09-15 DIAGNOSIS — K047 Periapical abscess without sinus: Secondary | ICD-10-CM

## 2020-09-15 MED ORDER — NAPROXEN 500 MG PO TABS
500 MG | ORAL_TABLET | Freq: Two times a day (BID) | ORAL | 0 refills | Status: AC
Start: 2020-09-15 — End: 2020-09-22

## 2020-09-15 MED ORDER — AMOXICILLIN-POT CLAVULANATE 875-125 MG PO TABS
875-125 MG | ORAL_TABLET | Freq: Two times a day (BID) | ORAL | 0 refills | Status: AC
Start: 2020-09-15 — End: 2020-09-25

## 2020-09-15 NOTE — Patient Instructions (Addendum)
Patient Education        Abscessed Tooth: Care Instructions  Your Care Instructions     An abscessed tooth is a tooth that has a pocket of pus in the tissues around it. Pus forms when the body tries to fight an infection caused by bacteria. If the pus cannot drain, it forms an abscess. An abscessed tooth can cause red, swollen gums and throbbing pain, especially when you chew. You may have a bad taste in your mouth and a fever, and your jaw may swell.  Damage to the tooth, untreated tooth decay, or gum disease can cause an abscessed tooth.  An abscessed tooth needs to be treated by a dental professional right away. If it is not treated, the infection could spread to other parts of your body. Your dentist will give you antibiotics to stop the infection. He or she may make a hole in the tooth or cut open (lance) the abscess inside your mouth so that the infection can drain, which should relieve your pain. You may need to have a root canal treatment, which tries to save your tooth by taking out the infected pulp and replacing it with a healing medicine and/or a filling. If these treatments do not work, your tooth may have to be removed.  Follow-up care is a key part of your treatment and safety. Be sure to make and go to all appointments, and call your doctor if you are having problems. It's also a good idea to know your test results and keep a list of the medicines you take.  How can you care for yourself at home?  ?? Reduce pain and swelling in your face and jaw by putting ice or a cold pack on the outside of your cheek. Do this for 10 to 20 minutes at a time. Put a thin cloth between the ice and your skin.  ?? Take pain medicines exactly as directed.  ? If the doctor gave you a prescription medicine for pain, take it as prescribed.  ? If you are not taking a prescription pain medicine, ask your doctor if you can take an over-the-counter medicine.  ?? Take antibiotics as directed. Do not stop taking them just because  you feel better. You need to take the full course of antibiotics.  To prevent tooth abscess  ?? Brush and floss every day. Have regular dental checkups.  ?? Eat a healthy diet. Avoid sugary foods and drinks.  ?? Do not smoke or vape with nicotine. And don't use spit tobacco. Tobacco and nicotine slow your ability to heal. They increase your risk for gum disease and cancer of the mouth and throat. If you need help quitting, talk to your doctor about stop-smoking programs and medicines. These can increase your chances of quitting for good.  When should you call for help?   Call 911 anytime you think you may need emergency care. For example, call if:  ?? ?? You have trouble breathing.   Call your doctor now or seek immediate medical care if:  ?? ?? You have new or worse symptoms of infection, such as:  ? Increased pain, swelling, warmth, or redness.  ? Red streaks leading from the area.  ? Pus draining from the area.  ? A fever.   Watch closely for changes in your health, and be sure to contact your doctor if:  ?? ?? You do not get better as expected.   Where can you learn more?  Go to [email or web address]   and sign in to your MyChart account. Enter [phone] in the Search Health Information box to learn more about "Abscessed Tooth: Care Instructions."     If you do not have an account, please click on the "Sign Up Now" link.  Current as of: April 25, 2020??????????????????????????????Content Version: 13.0  ?? 2006-2021 [employer].   Care instructions adapted under license by [facility]. If you have questions about a medical condition or this instruction, always ask your healthcare professional. [employer], Inc[employer] disclaims any warranty or liability for your use of this information.         Patient Education        Learning About the Safe Use of Antibiotics  Introduction     Antibiotics are drugs used to kill bacteria. Bacteria can cause infections. These include strep throat, ear infections, and  pneumonia.  These medicines can't cure everything. They don't kill viruses or help with allergies. They don't help illnesses such as the common cold, the flu, or a runny nose. And they can cause side effects.  There are many types of antibiotics. Your doctor will decide which one will work best for your infection. Examples include:  ?? Amoxicillin.  ?? Cephalexin (Keflex).  ?? Ciprofloxacin (Cipro).  What are the possible side effects?  Side effects can include:  ?? Nausea.  ?? Diarrhea.  ?? Skin rash.  ?? Yeast infection.  ?? A severe allergic reaction. It may cause itching, swelling, and breathing problems. This is rare.  You may have other side effects or reactions not listed here. Check the information that comes with your medicine.  Should you take antibiotics just in case?  Don't take antibiotics when you don't need them. If you do that, they may not work when you do need them.  Each time you take antibiotics, you are more likely to have some bacteria that survive and aren't killed by the medicine. Bacteria that don't die can change and become even harder to kill. These are called antibiotic-resistant bacteria. They can cause longer and more serious infections. To treat them, you may need different, stronger antibiotics that have more side effects and may cost more.  So always ask your doctor if antibiotics are the best treatment. Explain that you do not want antibiotics unless you need them.  Help protect the community  Using antibiotics when they're not needed leads to the development of antibiotic-resistant bacteria. These tougher bacteria can spread to family members, children, and coworkers. People in your community will have a risk of getting an infection that is harder to cure and that costs more to treat.  How can you take antibiotics safely?  Be safe with medicine. Take your antibiotics as directed. Do not stop taking them just because you feel better. You need to take the full course of medicine. This will  help make sure your infection is cured. It will also help prevent the growth of antibiotic-resistant bacteria.  Always take the exact amount that the label says to take. If the label says to take the medicine at a certain time, follow those directions.  You might feel better after you take an antibiotic for a few days. But it is important to keep taking it for as long as prescribed. That will help you get rid of those bacteria that are a bit stronger and that survive the first few days of treatment.  Where can you learn more?  Go to [email or web address] and sign in to your MyChart account. Enter [phone] in the  Search Health Information box to learn more about "Learning About the Safe Use of Antibiotics."     If you do not have an account, please click on the "Sign Up Now" link.  Current as of: April 26, 2020??????????????????????????????Content Version: 13.0  ?? 2006-2021 [employer].   Care instructions adapted under license by [facility]. If you have questions about a medical condition or this instruction, always ask your healthcare professional. [employer] disclaims any warranty or liability for your use of this information.

## 2020-09-15 NOTE — Progress Notes (Signed)
Subjective:     Patient: Jacob Richmond is a 59 y.o. male     HPI   Patient presents to urgent care today with complaints of left lower dental pain.  Patient states few months ago his filling fell out.  Patient states since the tooth is now broken, stating that is never really gave him a problem but recently when he was eating pizza he bit down and stated he started having pain.  Patient states he did call the dentist for this and was able to get an appointment but his appointment is not till December 1.  Patient states this morning he woke up noticing that the left lower part of his mouth appeared swollen.  Patient states he has been taking Aleve for pain.      Review of Systems   Constitutional: Negative for activity change, appetite change, chills, fatigue and fever.   HENT: Positive for dental problem (left lower- broken tooth). Negative for congestion, rhinorrhea, sinus pressure, sinus pain, sore throat and trouble swallowing.    Cardiovascular: Negative for leg swelling.   Gastrointestinal: Negative for abdominal pain, diarrhea, nausea and vomiting.   Allergic/Immunologic: Negative for environmental allergies, food allergies and immunocompromised state.   Neurological: Negative for dizziness, light-headedness and headaches.        No Known Allergies  No current outpatient medications on file prior to visit.     No current facility-administered medications on file prior to visit.      History reviewed. No pertinent past medical history.   Social History     Tobacco Use   ??? Smoking status: Former Smoker   ??? Smokeless tobacco: Never Used   Substance Use Topics   ??? Alcohol use: Never          :     BP (!) 145/87 (Site: Right Upper Arm, Position: Sitting, Cuff Size: Large Adult)    Pulse 85    Temp 97.8 ??F (36.6 ??C) (Temporal)    Wt 167 lb (75.8 kg)    SpO2 99%    BMI 24.66 kg/m??     Physical Exam  Vitals and nursing note reviewed.   Constitutional:       Appearance: Normal appearance. He is normal weight.    HENT:      Head: Normocephalic.      Mouth/Throat:      Mouth: Mucous membranes are moist.      Dentition: Abnormal dentition. Dental tenderness, gingival swelling and dental abscesses present.      Pharynx: Oropharynx is clear. No oropharyngeal exudate or posterior oropharyngeal erythema.     Eyes:      Extraocular Movements: Extraocular movements intact.      Conjunctiva/sclera: Conjunctivae normal.      Pupils: Pupils are equal, round, and reactive to light.   Pulmonary:      Effort: Pulmonary effort is normal. No respiratory distress.   Abdominal:      General: Abdomen is flat.   Skin:     General: Skin is warm and dry.   Neurological:      General: No focal deficit present.      Mental Status: He is alert and oriented to person, place, and time. Mental status is at baseline.   Psychiatric:         Mood and Affect: Mood normal.         Behavior: Behavior normal.         Thought Content: Thought content normal.  Judgment: Judgment normal.           No results found for this visit on 09/15/20.    Assessment      1. Dental abscess         Plan      1. Dental abscess  - amoxicillin-clavulanate (AUGMENTIN) 875-125 MG per tablet; Take 1 tablet by mouth 2 times daily for 10 days  Dispense: 20 tablet; Refill: 0  - naproxen (NAPROSYN) 500 MG tablet; Take 1 tablet by mouth 2 times daily (with meals) for 7 days  Dispense: 14 tablet; Refill: 0    Plan of care for this patient is to treat for dental abscess.  Patient was advised to be given prescription for naproxen and Augmentin.  Patient was advised to take both medications as prescribed and take both medications with food.  Patient increase fluids.  Patient to keep dental visit for December 1.  For any new or worsening symptoms patient otherwise to follow-up with PCP or go to emergency department for further evaluation.  Patient is agreeable plan of care.      (Please note that portions of this note may have been completed with a voice recognition program.  Efforts were made to edit the dictations but occasionally words aremis-transcribed.)    Evern Bio, APRN - NP  09/15/20  5:07 PM

## 2020-09-25 ENCOUNTER — Other Ambulatory Visit: Payer: Self-pay

## 2020-09-25 NOTE — Patient Outreach (Signed)
  [facility]) Care Management Chronic Special Needs Program    09/25/2020  Name: Jonathan Miles, DOB: 1961-04-16  MRN: [id number]   [name] is enrolled in a chronic special needs plan for Diabetes.  Client is transitioning into another [employer] case management program as of 10/27/20.  Case manager will no longer be following goal after 10/26/20.     Quinn Plowman RN,BSN,CCM [facility] Care Management [phone]

## 2020-09-27 ENCOUNTER — Other Ambulatory Visit: Payer: Self-pay

## 2020-09-27 ENCOUNTER — Encounter: Payer: Self-pay | Admitting: Adult Health

## 2020-09-27 ENCOUNTER — Ambulatory Visit: Payer: HMO | Admitting: Adult Health

## 2020-09-27 VITALS — BP 160/94 | HR 81 | Ht 75.0 in | Wt 315.0 lb

## 2020-09-27 DIAGNOSIS — R5382 Chronic fatigue, unspecified: Secondary | ICD-10-CM | POA: Diagnosis not present

## 2020-09-27 DIAGNOSIS — F329 Major depressive disorder, single episode, unspecified: Secondary | ICD-10-CM | POA: Diagnosis not present

## 2020-09-27 DIAGNOSIS — G4733 Obstructive sleep apnea (adult) (pediatric): Secondary | ICD-10-CM

## 2020-09-27 DIAGNOSIS — Z8673 Personal history of transient ischemic attack (TIA), and cerebral infarction without residual deficits: Secondary | ICD-10-CM

## 2020-09-27 NOTE — Patient Instructions (Addendum)
Start Armodafinil which replaced modafinil - currently at [place] amitriptyline 25mg  nightly for headache prevention  Ensure reschedule of sleep study  Continue aspirin 325 mg daily  and atorvastatin  for secondary stroke prevention  Continue to follow up with PCP regarding cholesterol, diabetes and blood pressure management  Maintain strict control of hypertension with blood pressure goal below 130/90, diabetes with hemoglobin A1c goal below 7.0% and cholesterol with LDL cholesterol (bad cholesterol) goal below 70 mg/dL.       Followup in the future with me in 6 months or call earlier if needed       Thank you for coming to see [place] at [facility]. I hope we have been able to provide you high quality care today.  You may receive a patient satisfaction survey over the next few weeks. We would appreciate your feedback and comments so that we may continue to improve ourselves and the health of our patients.

## 2020-09-27 NOTE — Progress Notes (Signed)
[facility] [address] [name]. [place] [address] [phone]       OFFICE FOLLOW UP NOTE  [name] Date of Birth:  [birth year] Medical Record Number:  [id number]   Reason for visit: Stroke follow up [place] provider: Dr. Leonie Man  CHIEF COMPLAINT:  Chief Complaint  Patient presents with  . Follow-up    pt said his father just past last week and it a bit sad. Pt is not have new sx re: the stroke. He said he is still fatigued.  . Cerebrovascular Accident    HPI:   [name] is a pleasant 59 year old male with PMH of DM, HTN, HLD, OSA on CPAP, CAD, CABG and valve sparing aortic root replacement 2013, thoracic aortic aneurysm without rupture and left pontine infarct in 11/2017.  Recovered well from a stroke standpoint but continued to have ongoing poststroke depression, poststroke headache and excessive fatigue with exertion accompanied by worsening imbalance, gait impairment and headache.  Due to ongoing deficits, he continues to receive Social Security disability.  Initiated on amitriptyline 25 mg nightly for benefit of headache and depression as well as modafinil 200 mg daily for excessive daytime fatigue limiting daytime functioning.    Today, 09/27/2020, Mr. Nigh returns for 57-month stroke follow-up.  Been doing okay since prior visit.  At prior visit, discussion regarding multiple home life stressors caring for his father with end-stage dementia and Parkinson's who passed away last week.  Fatigue previously improving but worsened when caring for his father over the past 6 months.  Previously on modafinil but due to insurance purposes, switch to armodafinil but patient not aware this was sent to his pharmacy therefore he has not started. Of note, COVID + in October and received monoclonal antibody with benefit.  Denies residual lasting deficits.  Headache stable with ongoing use of amitriptyline 25 mg nightly.  Repeat sleep study previously scheduled  but had to be canceled due to COVID +.  He plans on calling to reschedule.  Reports ongoing compliance with CPAP for OSA management.  On aspirin and atorvastatin for secondary stroke prevention of side effects.  Recent  LDL 79 (down from 173).  Blood pressure today 167/84 -has not taken BP meds yet today.   Plans on returning to urology next month for surveillance monitoring of prostate cancer.  Denies new stroke/TIA symptoms.  No further concerns at this time.        ROS:   Review of Systems  Constitutional: Positive for malaise/fatigue.  HENT: Negative.   Eyes: Negative.   Respiratory: Positive for cough (intermittent post COVID).   Cardiovascular: Negative.   Gastrointestinal: Negative.   Genitourinary: Negative.  Negative for frequency and urgency.  Musculoskeletal: Negative.   Skin: Negative.   Neurological: Negative.  Negative for headaches.  Endo/Heme/Allergies: Negative.   Psychiatric/Behavioral: Positive for depression. The patient is nervous/anxious.      PMH:  Past Medical History:  Diagnosis Date  . Alcohol abuse   . Allergy   . Anxiety   . Aortic root dilatation (HCC)    a. echo 4/12: EF 55-60%, mod to marked dilated ascending aortic root;   b. MRA 4/12: Aortic root 5.5 cm, dilation of left and right coronary cusps, trileaflet aortic valve // Echo 2/19: Moderate LVH, EF 55-60, normal wall motion, grade 1 diastolic dysfunction, mild AI, mildly dilated aortic root, moderate to severe LAE (aortic root 40 mm; ascending aorta 39 mm)   . Arthritis   . Asthma  ast attack in early 20's  . CAD (coronary artery disease)   . Cancer [facility])    prostate cancer  . Chest pain    Myoview 4/12: EF 62%, no ischemia or scar  . CHF (congestive heart failure) ([place])   . Chronic pain    back and hands  . Constipation   . COPD (chronic obstructive pulmonary disease) ([place])   . Depression   . Diabetes mellitus    type 2  . Difficulty speaking   . Drug use   . Enlarged prostate    . Fatigue   . GERD (gastroesophageal reflux disease)    modifies with diet  . Hx of echocardiogram    Echo (2/16):  Mod LVH, EF 55-60%, mild to mod AI, mod LAE, mild RAE, Aortic root 38 mm, Asc aorta 43 mm  . Hyperlipidemia   . Hypertension   . Joint pain   . Knee pain, bilateral   . Leg pain    ABIs 3/19: Normal (R 1.3; L 1.28)  . Neuromuscular disorder ([place])    left foot ? neuropathy   . OSA (obstructive sleep apnea) 2006   .  Wears at times  . Osteoarthritis   . Palpitations   . Sleep apnea    wears cpap  but having sleep study 08-16-2020  . Stroke [facility]) 2018    PSH:  Past Surgical History:  Procedure Laterality Date  . APPENDECTOMY  12/2016  . CHOLECYSTECTOMY N/A 11/24/2018   Procedure: LAPAROSCOPIC CHOLECYSTECTOMY;  Surgeon: Stark Klein, MD;  Location: [facility];  Service: General;  Laterality: N/A;  . COLONOSCOPY    . Colonscopy    . CORONARY ARTERY BYPASS GRAFT  05/07/2012   Procedure: CORONARY ARTERY BYPASS GRAFTING (CABG);  Surgeon: Grace Isaac, MD;  Location: [place];  Service: Open Heart Surgery;  Laterality: N/A;  . KNEE ARTHROSCOPY Left 2010   Dr. Rosamaria Lints  . LAPAROSCOPIC APPENDECTOMY N/A 01/06/2017   Procedure: APPENDECTOMY LAPAROSCOPIC;  Surgeon: Ralene Ok, MD;  Location: [facility];  Service: General;  Laterality: N/A;  . Retactment of fingers     as a child , sewed with sewing machine- Index and middle finger  . UPPER GASTROINTESTINAL ENDOSCOPY      Social History:  Social History   Socioeconomic History  . Marital status: Married    Spouse name: Garnell Phenix  . Number of children: 0  . Years of education: 12th grade  . Highest education level: Not on file  Occupational History  . 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  Tobacco Use  . Smoking status: Former Smoker    Packs/day: 1.00    Years: 13.00    Pack years: 13.00    Types: Cigarettes    Quit date: 01/23/1988    Years since  quitting: 32.7  . Smokeless tobacco: Never Used  Vaping Use  . Vaping Use: Never used  Substance and Sexual Activity  . Alcohol use: Not Currently    Comment: quit 1989  . Drug use: No    Comment: previous cocaine and marijuana use, quit 1988  . Sexual activity: Not Currently    Partners: Female  Other Topics Concern  . Not on file  Social History Narrative   Married, lives in [place]. Therapist, occupational for [employer].    Pt is adopted. Unsure of his family medical history.   Social Determinants of Health   Financial Resource Strain:   . Difficulty  of Paying Living Expenses: Not on file  Food Insecurity:   . Worried About Charity fundraiser in the Last Year: Not on file  . Ran Out of Food in the Last Year: Not on file  Transportation Needs:   . Lack of Transportation (Medical): Not on file  . Lack of Transportation (Non-Medical): Not on file  Physical Activity:   . Days of Exercise per Week: Not on file  . Minutes of Exercise per Session: Not on file  Stress:   . Feeling of Stress : Not on file  Social Connections:   . Frequency of Communication with Friends and Family: Not on file  . Frequency of Social Gatherings with Friends and Family: Not on file  . Attends Religious Services: Not on file  . Active Member of Clubs or Organizations: Not on file  . Attends Archivist Meetings: Not on file  . Marital Status: Not on file  Intimate Partner Violence:   . Fear of Current or Ex-Partner: Not on file  . Emotionally Abused: Not on file  . Physically Abused: Not on file  . Sexually Abused: Not on file    Family History:  Family History  Adopted: Yes  Family history unknown: Yes    Medications:   Current Outpatient Medications on File Prior to Visit  Medication Sig Dispense Refill  . acetaminophen (TYLENOL) 500 MG tablet TAKE 1-2 TABS EVERY 6 HOURS AS NEEDED FOR PAIN.  Do not take more than 4000 mg of Tylenol per day it can harm your liver. 30  tablet 0  . amitriptyline (ELAVIL) 25 MG tablet TAKE ONE TABLET BY MOUTH AT BEDTIME 30 tablet 5  . Armodafinil 150 MG tablet Take 1 tablet (150 mg total) by mouth daily. 30 tablet 5  . aspirin 325 MG EC tablet Take 1 tablet (325 mg total) by mouth at bedtime. 90 tablet 3  . atorvastatin (LIPITOR) 80 MG tablet Take 1 tablet (80 mg total) by mouth daily at 6 PM. 90 tablet 3  . buPROPion (WELLBUTRIN XL) 300 MG 24 hr tablet Take 1 tablet (300 mg total) by mouth daily. 30 tablet 6  . carvedilol (COREG) 12.5 MG tablet Take 12.5 mg by mouth 2 (two) times daily with a meal.    . felodipine (PLENDIL) 10 MG 24 hr tablet TAKE ONE TABLET BY MOUTH DAILY 90 tablet 2  . furosemide (LASIX) 40 MG tablet TAKE ONE TABLET BY MOUTH DAILY 90 tablet 2  . lidocaine (XYLOCAINE) 5 % ointment Apply 1 application topically as needed. Daily for leg pain 50 g 3  . lisinopril (PRINIVIL,ZESTRIL) 20 MG tablet TAKE ONE TABLET BY MOUTH DAILY 90 tablet 2  . loratadine (CLARITIN) 10 MG tablet Take 10 mg by mouth at bedtime.     . Menthol-Methyl Salicylate (THERA-GESIC) 0.5-15 % CREA Apply 1 application topically daily as needed (pain).    . metFORMIN (GLUCOPHAGE) 1000 MG tablet Take 1 tablet (1,000 mg total) by mouth 2 (two) times daily with a meal. 180 tablet 3  . mirabegron ER (MYRBETRIQ) 50 MG TB24 tablet Take 50 mg by mouth at bedtime.     . sertraline (ZOLOFT) 100 MG tablet Take 2 tablets (200 mg total) by mouth at bedtime. 180 tablet 3  . Sodium Sulfate-Mag Sulfate-KCl (SUTAB) 3471371943 MG TABS Take 24 tablets by mouth as directed. 24 tablet 0  . tamsulosin (FLOMAX) 0.4 MG CAPS capsule Take 2 capsules (0.8 mg total) by mouth daily after supper. 180 capsule  3  . traMADol (ULTRAM) 50 MG tablet Take by mouth every 6 (six) hours as needed.    Loura Pardon Salicylate (ASPERCREME EX) Apply 1 application topically daily as needed (pain).    . TURMERIC PO Take by mouth.     No current facility-administered medications on file  prior to visit.    Allergies:   Allergies  Allergen Reactions  . Ibuprofen Other (See Comments)    TOLD NOT TO TAKE-PER CARDS  . Iodinated Diagnostic Agents Hives    Hives started 4 days after cta chest was performed, minimal relief w/ benadryl x 3 days, uncertain if reaction was actually iv contrast or other allergen,allergy tests to follow.wife will make [place] aware of results//a.[place]  . Quinolones Other (See Comments)     Physical Exam  Vitals:   09/27/20 1312  BP: (!) 167/84  Pulse: 81  Weight: (!) 315 lb (142.9 kg)  Height: 6\' 3"  (1.905 m)   Body mass index is 39.37 kg/m. No exam data present  General: Obese pleasant middle-age male, seated, in no evident distress but flat affect Head: head normocephalic and atraumatic.   Neck: supple with no carotid or supraclavicular bruits Cardiovascular: regular rate and rhythm, no murmurs Musculoskeletal: no deformity Skin:  no rash/petichiae Vascular:  Normal pulses all extremities  Neurologic Exam Mental Status: Awake and fully alert.  Fluent speech and language.  Oriented to place and time. Recent and remote memory intact. Attention span, concentration and fund of knowledge appropriate. Mood and affect flat. Cranial Nerves: Pupils equal, briskly reactive to light. Extraocular movements full without nystagmus. Visual fields full to confrontation. Hearing intact. Facial sensation intact.   Motor: Full strength in all tested extremities Sensory.: intact to touch , pinprick , position and vibratory sensation.  Coordination: Rapid alternating movements normal in all extremities. Finger-to-nose and heel-to-shin performed accurately bilaterally.   Gait and Station: Arises from chair without difficulty. Stance is normal. Gait demonstrates normal stride and balance.  Able to heel, toe and tandem walk with mild difficulty.  Reflexes: 1+ and symmetric. Toes downgoing.      ASSESSMENT/PLAN: Shaheen Mende is a 59 y.o. year old male  here with left pontine infarct on 12/17/2017 secondary to small vessel disease. Vascular risk factors include HTN, HLD, DM, OSA and CAD.  Diagnosed with prostate cancer 02/2020. Recent COVID 19+ receiving monoclonal antibodies   Stroke: -Continue aspirin 325 mg daily and atorvastatin for secondary stroke prevention -Continue to follow with PCP for HTN, HLD and DM management as well as ongoing prescribing of atorvastatin -Maintain strict control of hypertension with blood pressure goal below 130/90, diabetes with hemoglobin A1c goal below 7% and cholesterol with LDL cholesterol (bad cholesterol) goal below 70 mg/dL. -Recent LDL 79 down from 173  Post stroke fatigue Post stroke depression/anxiety -Continued concern over the past 6 months d/t caring for his elderly father with dementia and Parkinson's who recently passed away last week.  -Advised to initiate armodafinil 150 mg daily and to continue sertraline, bupropion and amitriptyline as fatigue and depression previously well managed -Previously on modafinil with benefit but due to insurance purposes, changed to armodafinil  Severe OSA: -Advised to reschedule sleep study as previously canceled due to COVID + -Prior compliance report showed noncompliance likely contributing to excessive daytime fatigue   Follow up in 6 months or call earlier if needed   CC:  [facility] provider: Caren Macadam, MD    I spent 40 minutes of face-to-face and non-face-to-face time with patient.  This  included previsit chart review, lab review, study review, order entry, electronic health record documentation, patient education and discussion regarding history of prior stroke, ongoing fatigue and depression/anxiety, severe OSA, importance of managing stroke risk factors and answered all questions to patient satisfaction   Frann Rider, [facility]  [facility] [address] [place], [place] [address]  Phone [phone] Fax  [phone]

## 2020-09-27 NOTE — Progress Notes (Signed)
I agree with the above plan 

## 2020-10-09 ENCOUNTER — Telehealth: Payer: Self-pay

## 2020-10-09 NOTE — Telephone Encounter (Signed)
  This request has received a favorable outcome and is approved PA Case: [id number], Status: Approved, Coverage Starts on: 10/09/2020 12:00:00 AM, Coverage Ends on: 10/09/2021 12:00:00 AM.

## 2020-10-09 NOTE — Telephone Encounter (Signed)
Jonathan Miles - PA Case ID: [id number] - Rx #: [id number]   [employer] has received your information, and the request will be reviewed. You may close this dialog, return to your dashboard, and perform other tasks.  You will receive an electronic determination in CoverMyMeds. You can see the latest determination by locating this request on your dashboard or by reopening this request. You will also receive a faxed copy of the determination. If you have any questions please contact [employer] at [phone].  If you need assistance, please chat with CoverMyMeds or call us at [phone].

## 2020-10-30 ENCOUNTER — Other Ambulatory Visit: Payer: Self-pay

## 2020-12-13 ENCOUNTER — Encounter: Payer: Self-pay | Admitting: Gastroenterology

## 2021-01-08 ENCOUNTER — Ambulatory Visit: Payer: HMO

## 2021-02-04 ENCOUNTER — Ambulatory Visit (AMBULATORY_SURGERY_CENTER): Payer: Self-pay

## 2021-02-04 ENCOUNTER — Other Ambulatory Visit: Payer: Self-pay

## 2021-02-04 VITALS — Ht 75.0 in | Wt 311.0 lb

## 2021-02-04 DIAGNOSIS — Z1211 Encounter for screening for malignant neoplasm of colon: Secondary | ICD-10-CM

## 2021-02-04 NOTE — Progress Notes (Signed)
No allergies to soy or egg Pt is not on blood thinners or diet pills Denies issues with sedation/intubation Denies atrial flutter/fib Denies constipation   Emmi instructions given to pt  Pt is aware of Covid safety and care partner requirements.   Has Sutab from the fall and will follow with Sutab vs Golytely.  Instructed pt to check date to be use it is still good to use.     Has prostate cancer-but is just being observed.

## 2021-02-05 ENCOUNTER — Encounter: Payer: Self-pay | Admitting: Gastroenterology

## 2021-02-18 ENCOUNTER — Ambulatory Visit (AMBULATORY_SURGERY_CENTER): Payer: HMO | Admitting: Gastroenterology

## 2021-02-18 ENCOUNTER — Other Ambulatory Visit: Payer: Self-pay

## 2021-02-18 ENCOUNTER — Encounter: Payer: Self-pay | Admitting: Gastroenterology

## 2021-02-18 VITALS — BP 157/74 | HR 75 | Temp 97.8°F | Resp 29 | Ht 75.0 in | Wt 311.0 lb

## 2021-02-18 DIAGNOSIS — D123 Benign neoplasm of transverse colon: Secondary | ICD-10-CM

## 2021-02-18 DIAGNOSIS — D126 Benign neoplasm of colon, unspecified: Secondary | ICD-10-CM

## 2021-02-18 DIAGNOSIS — Z1211 Encounter for screening for malignant neoplasm of colon: Secondary | ICD-10-CM | POA: Diagnosis not present

## 2021-02-18 MED ORDER — SODIUM CHLORIDE 0.9 % IV SOLN: 500.0000 mL | INTRAVENOUS | Status: DC

## 2021-02-18 NOTE — Patient Instructions (Signed)
 Handouts on polyps,diverticulosis ,& hemorrhoids given to you today  Await pathology results on polyps removed    YOU HAD AN ENDOSCOPIC PROCEDURE TODAY AT THE [facility]:   Refer to the procedure report that was given to you for any specific questions about what was found during the examination.  If the procedure report does not answer your questions, please call your gastroenterologist to clarify.  If you requested that your care partner not be given the details of your procedure findings, then the procedure report has been included in a sealed envelope for you to review at your convenience later.  YOU SHOULD EXPECT: Some feelings of bloating in the abdomen. Passage of more gas than usual.  Walking can help get rid of the air that was put into your GI tract during the procedure and reduce the bloating. If you had a lower endoscopy (such as a colonoscopy or flexible sigmoidoscopy) you may notice spotting of blood in your stool or on the toilet paper. If you underwent a bowel prep for your procedure, you may not have a normal bowel movement for a few days.  Please Note:  You might notice some irritation and congestion in your nose or some drainage.  This is from the oxygen used during your procedure.  There is no need for concern and it should clear up in a day or so.  SYMPTOMS TO REPORT IMMEDIATELY:   Following lower endoscopy (colonoscopy or flexible sigmoidoscopy):  Excessive amounts of blood in the stool  Significant tenderness or worsening of abdominal pains  Swelling of the abdomen that is new, acute  Fever of 100F or higher    For urgent or emergent issues, a gastroenterologist can be reached at any hour by calling [phone]. Do not use MyChart messaging for urgent concerns.    DIET:  We do recommend a small meal at first, but then you may proceed to your regular diet.  Drink plenty of fluids but you should avoid alcoholic beverages for 24 hours.  ACTIVITY:   You should plan to take it easy for the rest of today and you should NOT DRIVE or use heavy machinery until tomorrow (because of the sedation medicines used during the test).    FOLLOW UP: Our staff will call the number listed on your records 48-72 hours following your procedure to check on you and address any questions or concerns that you may have regarding the information given to you following your procedure. If we do not reach you, we will leave a message.  We will attempt to reach you two times.  During this call, we will ask if you have developed any symptoms of COVID 19. If you develop any symptoms (ie: fever, flu-like symptoms, shortness of breath, cough etc.) before then, please call [phone].  If you test positive for Covid 19 in the 2 weeks post procedure, please call and report this information to us.    If any biopsies were taken you will be contacted by phone or by letter within the next 1-3 weeks.  Please call us at [phone] if you have not heard about the biopsies in 3 weeks.    SIGNATURES/CONFIDENTIALITY: You and/or your care partner have signed paperwork which will be entered into your electronic medical record.  These signatures attest to the fact that that the information above on your After Visit Summary has been reviewed and is understood.  Full responsibility of the confidentiality of this discharge information lies with you   and/or your care-partner. 

## 2021-02-18 NOTE — Progress Notes (Signed)
Called to room to assist during endoscopic procedure.  Patient ID and intended procedure confirmed with present staff. Received instructions for my participation in the procedure from the performing physician.  

## 2021-02-18 NOTE — Op Note (Signed)
[facility] Patient Name: Jonathan Miles Procedure Date: 02/18/2021 9:20 AM MRN: [id number] Endoscopist: Remo Lipps P. Havery Moros , MD Age: 60 Referring MD:  Date of Birth: 03-07-1961 Gender: Male Account #: [id number] Procedure:                Colonoscopy Indications:              Screening for colorectal malignant neoplasm Medicines:                Monitored Anesthesia Care Procedure:                Pre-Anesthesia Assessment:                           - Prior to the procedure, a History and Physical                            was performed, and patient medications and                            allergies were reviewed. The patient's tolerance of                            previous anesthesia was also reviewed. The risks                            and benefits of the procedure and the sedation                            options and risks were discussed with the patient.                            All questions were answered, and informed consent                            was obtained. Prior Anticoagulants: The patient has                            taken no previous anticoagulant or antiplatelet                            agents. ASA Grade Assessment: III - A patient with                            severe systemic disease. After reviewing the risks                            and benefits, the patient was deemed in                            satisfactory condition to undergo the procedure.                           After obtaining informed consent, the colonoscope  was passed under direct vision. Throughout the                            procedure, the patient's blood pressure, pulse, and                            oxygen saturations were monitored continuously. The                            Olympus CF-HQ190 [id number]) Colonoscope was                            introduced through the anus and advanced to the the                            cecum,  identified by appendiceal orifice and                            ileocecal valve. The colonoscopy was performed                            without difficulty. The patient tolerated the                            procedure well. The quality of the bowel                            preparation was adequate. The ileocecal valve,                            appendiceal orifice, and rectum were photographed. Scope In: 9:26:24 AM Scope Out: 9:57:40 AM Scope Withdrawal Time: 0 hours 21 minutes 31 seconds  Total Procedure Duration: 0 hours 31 minutes 16 seconds  Findings:                 The perianal and digital rectal examinations were                            normal.                           A diminutive polyp was found in the transverse                            colon. The polyp was sessile. The polyp was removed                            with a cold snare. Resection and retrieval were                            complete.                           A 3 to 4 mm polyp was found in the splenic flexure.  The polyp was sessile. The polyp was removed with a                            cold snare. Resection and retrieval were complete.                           Scattered medium-mouthed diverticula were found in                            the transverse colon and left colon.                           Internal hemorrhoids were found during                            retroflexion. The hemorrhoids were small.                           The colon revealed excessive looping, abdominal                            pressure used to achieve cecal intubation. Prep was                            adequate but several minutes spent lavaging to                            achieve adequate views..                           The exam was otherwise without abnormality. Complications:            No immediate complications. Estimated blood loss:                            Minimal. Estimated  Blood Loss:     Estimated blood loss: none. Estimated blood loss                            was minimal. Impression:               - One diminutive polyp in the transverse colon,                            removed with a cold snare. Resected and retrieved.                           - One 3 to 4 mm polyp at the splenic flexure,                            removed with a cold snare. Resected and retrieved.                           - Diverticulosis in the transverse colon and in the  left colon.                           - Internal hemorrhoids.                           - There was significant looping of the colon.                           - The examination was otherwise normal. Recommendation:           - Patient has a contact number available for                            emergencies. The signs and symptoms of potential                            delayed complications were discussed with the                            patient. Return to normal activities tomorrow.                            Written discharge instructions were provided to the                            patient.                           - Resume previous diet.                           - Continue present medications.                           - Await pathology results. Remo Lipps P. Jordain Radin, MD 02/18/2021 10:03:19 AM This report has been signed electronically.

## 2021-02-18 NOTE — Progress Notes (Signed)
Report given to PACU, vss 

## 2021-02-20 ENCOUNTER — Telehealth: Payer: Self-pay

## 2021-02-20 NOTE — Telephone Encounter (Signed)
Left message on answering machine. 

## 2021-02-23 ENCOUNTER — Other Ambulatory Visit: Payer: Self-pay | Admitting: Adult Health

## 2021-02-23 DIAGNOSIS — G4733 Obstructive sleep apnea (adult) (pediatric): Secondary | ICD-10-CM

## 2021-03-20 ENCOUNTER — Ambulatory Visit: Payer: HMO | Admitting: Orthopaedic Surgery

## 2021-03-20 ENCOUNTER — Encounter: Payer: Self-pay | Admitting: Orthopaedic Surgery

## 2021-03-20 ENCOUNTER — Ambulatory Visit (INDEPENDENT_AMBULATORY_CARE_PROVIDER_SITE_OTHER): Payer: HMO

## 2021-03-20 DIAGNOSIS — M1712 Unilateral primary osteoarthritis, left knee: Secondary | ICD-10-CM | POA: Diagnosis not present

## 2021-03-20 DIAGNOSIS — M25562 Pain in left knee: Secondary | ICD-10-CM

## 2021-03-20 DIAGNOSIS — G8929 Other chronic pain: Secondary | ICD-10-CM | POA: Diagnosis not present

## 2021-03-20 NOTE — Progress Notes (Signed)
Office Visit Note   Patient: Jonathan Miles           Date of Birth: 04-11-1961           MRN: [id number] Visit Date: 03/20/2021              Requested by: Caren Macadam, MD [place],  [place] [address] PCP: Caren Macadam, MD   Assessment & Plan: Visit Diagnoses:  1. Primary osteoarthritis of left knee     Plan: Based on findings and our discussion I feel that Mr. Keown is getting close to knee replacement but he will have to decide when he is ready.  At this point I do not feel like additional injections would be of benefit.  I think the main thing that he can do to improve the pain is to lose a significant amount of weight.  I provided him with my card and information on knee replacement surgery.  Details of the surgery including risk benefits rehab recovery time all reviewed.  He will follow-up with me when he is ready for surgery.  Follow-Up Instructions: Return if symptoms worsen or fail to improve.   Orders:  Orders Placed This Encounter  Procedures  . XR KNEE 3 VIEW LEFT   No orders of the defined types were placed in this encounter.     Procedures: No procedures performed   Clinical Data: No additional findings.   Subjective: Chief Complaint  Patient presents with  . Left Knee - Pain    Jonathan Miles is a very pleasant 60 year old gentleman who comes in with his wife today for evaluation of chronic left knee pain.  He states he has 9 out of 10 pain at worst and most time has at least 5-6 out of 10 pain.  He is status post left knee arthroscopy in 2010 by Dr. Jola Babinski free.  He endorses numbness burning tingling in the left knee.  He uses tramadol lidocaine cream and Tylenol for temporary relief.  He has had a couple of prior cortisone injections with the most recent one giving only a couple weeks of relief.  He walks with a cane occasionally.  He is a bowler but he has had to back off on the amount of ball he does due to the left knee.  Mentally he does not  feel like he is ready for knee replacement surgery yet.  He is here to explore other options if available.   Review of Systems  Constitutional: Negative.   All other systems reviewed and are negative.    Objective: Vital Signs: There were no vitals taken for this visit.  Physical Exam Vitals and nursing note reviewed.  Constitutional:      Appearance: He is well-developed.  HENT:     Head: Normocephalic and atraumatic.  Eyes:     Pupils: Pupils are equal, round, and reactive to light.  Pulmonary:     Effort: Pulmonary effort is normal.  Abdominal:     Palpations: Abdomen is soft.  Musculoskeletal:        General: Normal range of motion.     Cervical back: Neck supple.  Skin:    General: Skin is warm.  Neurological:     Mental Status: He is alert and oriented to person, place, and time.  Psychiatric:        Behavior: Behavior normal.        Thought Content: Thought content normal.        Judgment: Judgment normal.  Ortho Exam Left knee shows no joint effusion.  Significant patellofemoral crepitus with range of motion.  There is significant pain as well with range of motion.  Collaterals and cruciates are stable.  Range of motion is 5 to 100 degrees with pain. Specialty Comments:  No specialty comments available.  Imaging: XR KNEE 3 VIEW LEFT  Result Date: 03/20/2021 Severe tricompartmental degenerative joint disease    PMFS History: Patient Active Problem List   Diagnosis Date Noted  . Acute cholecystitis 11/23/2018  . Late effect of stroke 12/26/2017  . History of stroke 12/16/2017  . BMI 36.0-36.9,adult 08/05/2017  . Cholelithiasis 08/05/2017  . Bilateral plantar fasciitis 08/05/2017  . Situational mixed anxiety and depressive disorder 08/05/2017  . Bilateral primary osteoarthritis of knee 07/01/2017  . Asthma 01/26/2017  . Overactive bladder 01/21/2017  . BPH with obstruction/lower urinary tract symptoms 01/21/2017  . S/P appendectomy 01/07/2017   . Diverticulosis 11/24/2016  . Type 2 diabetes mellitus without complication, without long-term current use of insulin ([place]) 10/31/2016  . Aortic insufficiency 11/23/2012  . Chronic diastolic CHF (congestive heart failure) ([place]) 11/23/2012  . CAD (coronary artery disease) 11/27/2011  . OSA (obstructive sleep apnea) 11/11/2011  . Aortic root dilatation ([place])   . Hyperlipidemia 01/23/2011  . Hypertension 01/23/2011   Past Medical History:  Diagnosis Date  . Alcohol abuse   . Allergy   . Anxiety   . Aortic root dilatation ([facility])    a. echo 4/12: EF 55-60%, mod to marked dilated ascending aortic root;   b. MRA 4/12: Aortic root 5.5 cm, dilation of left and right coronary cusps, trileaflet aortic valve // Echo 2/19: Moderate LVH, EF 55-60, normal wall motion, grade 1 diastolic dysfunction, mild AI, mildly dilated aortic root, moderate to severe LAE (aortic root 40 mm; ascending aorta 39 mm)   . Arthritis   . Asthma    ast attack in early 20's  . CAD (coronary artery disease)   . Cancer [facility])    prostate cancer  . Chest pain    Myoview 4/12: EF 62%, no ischemia or scar  . CHF (congestive heart failure) ([place])   . Chronic pain    back and hands  . Constipation   . COPD (chronic obstructive pulmonary disease) ([place])   . Depression   . Diabetes mellitus    type 2  . Difficulty speaking   . Drug use   . Enlarged prostate   . Fatigue   . GERD (gastroesophageal reflux disease)    modifies with diet  . Hx of echocardiogram    Echo (2/16):  Mod LVH, EF 55-60%, mild to mod AI, mod LAE, mild RAE, Aortic root 38 mm, Asc aorta 43 mm  . Hyperlipidemia   . Hypertension   . Joint pain   . Knee pain, bilateral   . Leg pain    ABIs 3/19: Normal (R 1.3; L 1.28)  . Neuromuscular disorder ([place])    left foot ? neuropathy   . OSA (obstructive sleep apnea) 2006   .  Wears at times  . Osteoarthritis   . Palpitations   . Sleep apnea    wears cpap  but having sleep study 08-16-2020; is not  wearing now  . Stroke [facility]) 2018    Family History  Adopted: Yes    Past Surgical History:  Procedure Laterality Date  . APPENDECTOMY  12/2016  . CHOLECYSTECTOMY N/A 11/24/2018   Procedure: LAPAROSCOPIC CHOLECYSTECTOMY;  Surgeon: Stark Klein, MD;  Location: [facility];  Service: General;  Laterality: N/A;  . COLONOSCOPY  2011  . Colonscopy    . CORONARY ARTERY BYPASS GRAFT  05/07/2012   Procedure: CORONARY ARTERY BYPASS GRAFTING (CABG);  Surgeon: Grace Isaac, MD;  Location: [place];  Service: Open Heart Surgery;  Laterality: N/A;  . KNEE ARTHROSCOPY Left 2010   Dr. Rosamaria Lints  . LAPAROSCOPIC APPENDECTOMY N/A 01/06/2017   Procedure: APPENDECTOMY LAPAROSCOPIC;  Surgeon: Ralene Ok, MD;  Location: [place];  Service: General;  Laterality: N/A;  . Retactment of fingers     as a child , sewed with sewing machine- Index and middle finger  . UPPER GASTROINTESTINAL ENDOSCOPY     Social History   Occupational History  . 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  Tobacco Use  . Smoking status: Former Smoker    Packs/day: 1.00    Years: 13.00    Pack years: 13.00    Types: Cigarettes    Quit date: 01/23/1988    Years since quitting: 33.1  . Smokeless tobacco: Never Used  Vaping Use  . Vaping Use: Never used  Substance and Sexual Activity  . Alcohol use: Not Currently    Comment: quit 1989  . Drug use: No    Comment: previous cocaine and marijuana use, quit 1988  . Sexual activity: Not Currently    Partners: Female

## 2021-03-28 ENCOUNTER — Ambulatory Visit: Payer: HMO | Admitting: Adult Health

## 2021-03-28 ENCOUNTER — Encounter: Payer: Self-pay | Admitting: Adult Health

## 2021-03-28 VITALS — BP 156/77 | HR 67 | Ht 75.0 in | Wt 309.0 lb

## 2021-03-28 DIAGNOSIS — R5382 Chronic fatigue, unspecified: Secondary | ICD-10-CM | POA: Diagnosis not present

## 2021-03-28 DIAGNOSIS — G4733 Obstructive sleep apnea (adult) (pediatric): Secondary | ICD-10-CM | POA: Diagnosis not present

## 2021-03-28 DIAGNOSIS — F418 Other specified anxiety disorders: Secondary | ICD-10-CM

## 2021-03-28 DIAGNOSIS — Z8673 Personal history of transient ischemic attack (TIA), and cerebral infarction without residual deficits: Secondary | ICD-10-CM | POA: Diagnosis not present

## 2021-03-28 NOTE — Progress Notes (Signed)
[facility] [address] [place]. [place] [address] [phone]       OFFICE FOLLOW UP NOTE  [name] Date of Birth:  02/20/1961 Medical Record Number:  [id number]   Reason for visit: Stroke follow up Jonathan Miles: Jonathan Miles  CHIEF COMPLAINT:  Chief Complaint  Patient presents with  . Follow-up    MR 14 with spouse Jonathan Miles.  Pt is well and stable from last visit, still has some fatigue, balance and dizziness complications.     HPI:   Jonathan Miles is a pleasant 60 year old male with PMH of DM, HTN, HLD, OSA on CPAP, CAD, CABG and valve sparing aortic root replacement 2013, thoracic aortic aneurysm without rupture and left pontine infarct in 11/2017.  Recovered well from a stroke standpoint but continued to have ongoing poststroke depression, poststroke headache and excessive fatigue with exertion accompanied by worsening imbalance, gait impairment and headache.  Due to ongoing deficits, he continues to receive Social Security disability.    Today, 03/28/2021, Jonathan Miles returns for 21-month follow-up accompanied by his wife Jonathan Miles.  He has been stable since prior visit without new stroke/TIA symptoms.  Residual gait impairment with imbalance stable without worsening.  Continues to experience daytime fatigue and some depression.  He has since self discontinued all antidepressants including amitriptyline, bupropion and sertraline.  He denies any issues with depression or anxiety except for situational things but per wife, she has noticed increased irritability and agitation.  He denies any additional headaches since stopping amitriptyline.  He recently restarted armodafinil as he had difficulty obtaining due to financial reasons - was off for approximately 2 months and did not notice a difference since he restarted.  Compliant on CPAP for OSA management.  Compliant on aspirin and atorvastatin without associated side effects.  Blood pressure today 156/97.  No  further concerns at this time.      ROS:   Review of Systems  Constitutional: Positive for malaise/fatigue.  HENT: Negative.   Eyes: Negative.   Cardiovascular: Negative.   Gastrointestinal: Negative.   Genitourinary: Negative.  Negative for frequency and urgency.  Musculoskeletal: Negative.   Skin: Negative.   Neurological: Negative.  Negative for headaches.       Gait impairment with imbalance  Endo/Heme/Allergies: Negative.   Psychiatric/Behavioral: Positive for depression. The patient is nervous/anxious.      PMH:  Past Medical History:  Diagnosis Date  . Alcohol abuse   . Allergy   . Anxiety   . Aortic root dilatation (HCC)    a. echo 4/12: EF 55-60%, mod to marked dilated ascending aortic root;   b. MRA 4/12: Aortic root 5.5 cm, dilation of left and right coronary cusps, trileaflet aortic valve // Echo 2/19: Moderate LVH, EF 55-60, normal wall motion, grade 1 diastolic dysfunction, mild AI, mildly dilated aortic root, moderate to severe LAE (aortic root 40 mm; ascending aorta 39 mm)   . Arthritis   . Asthma    ast attack in early 20's  . CAD (coronary artery disease)   . Cancer [facility])    prostate cancer  . Chest pain    Myoview 4/12: EF 62%, no ischemia or scar  . CHF (congestive heart failure) ([place])   . Chronic pain    back and hands  . Constipation   . COPD (chronic obstructive pulmonary disease) ([place])   . Depression   . Diabetes mellitus    type 2  . Difficulty speaking   . Drug use   .  Enlarged prostate   . Fatigue   . GERD (gastroesophageal reflux disease)    modifies with diet  . Hx of echocardiogram    Echo (2/16):  Mod LVH, EF 55-60%, mild to mod AI, mod LAE, mild RAE, Aortic root 38 mm, Asc aorta 43 mm  . Hyperlipidemia   . Hypertension   . Joint pain   . Knee pain, bilateral   . Leg pain    ABIs 3/19: Normal (R 1.3; L 1.28)  . Neuromuscular disorder ([place])    left foot ? neuropathy   . OSA (obstructive sleep apnea) 2006   .  Wears at times   . Osteoarthritis   . Palpitations   . Sleep apnea    wears cpap  but having sleep study 08-16-2020; is not wearing now  . Stroke [facility]) 2018    PSH:  Past Surgical History:  Procedure Laterality Date  . APPENDECTOMY  12/2016  . CHOLECYSTECTOMY N/A 11/24/2018   Procedure: LAPAROSCOPIC CHOLECYSTECTOMY;  Surgeon: Stark Klein, MD;  Location: [facility];  Service: General;  Laterality: N/A;  . COLONOSCOPY  2011  . Colonscopy    . CORONARY ARTERY BYPASS GRAFT  05/07/2012   Procedure: CORONARY ARTERY BYPASS GRAFTING (CABG);  Surgeon: Grace Isaac, MD;  Location: [facility];  Service: Open Heart Surgery;  Laterality: N/A;  . KNEE ARTHROSCOPY Left 2010   Dr. Rosamaria Lints  . LAPAROSCOPIC APPENDECTOMY N/A 01/06/2017   Procedure: APPENDECTOMY LAPAROSCOPIC;  Surgeon: Ralene Ok, MD;  Location: [facility];  Service: General;  Laterality: N/A;  . Retactment of fingers     as a child , sewed with sewing machine- Index and middle finger  . UPPER GASTROINTESTINAL ENDOSCOPY      Social History:  Social History   Socioeconomic History  . Marital status: Married    Spouse name: Jonathan Miles  . Number of children: 0  . Years of education: 12th grade  . Highest education level: Not on file  Occupational History  . 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  Tobacco Use  . Smoking status: Former Smoker    Packs/day: 1.00    Years: 13.00    Pack years: 13.00    Types: Cigarettes    Quit date: 01/23/1988    Years since quitting: 33.2  . Smokeless tobacco: Never Used  Vaping Use  . Vaping Use: Never used  Substance and Sexual Activity  . Alcohol use: Not Currently    Comment: quit 1989  . Drug use: No    Comment: previous cocaine and marijuana use, quit 1988  . Sexual activity: Not Currently    Partners: Female  Other Topics Concern  . Not on file  Social History Narrative   Married, lives in [place]. Therapist, occupational for [employer].    Pt is adopted. Unsure of his family medical history.   Social Determinants of Health   Financial Resource Strain: Not on file  Food Insecurity: Not on file  Transportation Needs: Not on file  Physical Activity: Not on file  Stress: Not on file  Social Connections: Not on file  Intimate Partner Violence: Not on file    Family History:  Family History  Adopted: Yes    Medications:   Current Outpatient Medications on File Prior to Visit  Medication Sig Dispense Refill  . acetaminophen (TYLENOL) 500 MG tablet TAKE 1-2 TABS EVERY 6 HOURS AS NEEDED FOR PAIN.  Do not take more than  4000 mg of Tylenol per day it can harm your liver. 30 tablet 0  . amitriptyline (ELAVIL) 25 MG tablet TAKE ONE TABLET BY MOUTH AT BEDTIME 30 tablet 5  . Armodafinil 150 MG tablet TAKE ONE TABLET BY MOUTH DAILY 30 tablet 5  . aspirin 325 MG EC tablet Take 1 tablet (325 mg total) by mouth at bedtime. 90 tablet 3  . atorvastatin (LIPITOR) 80 MG tablet Take 1 tablet (80 mg total) by mouth daily at 6 PM. 90 tablet 3  . buPROPion (WELLBUTRIN XL) 300 MG 24 hr tablet Take 1 tablet (300 mg total) by mouth daily. 30 tablet 6  . carvedilol (COREG) 25 MG tablet Take 25 mg by mouth 2 (two) times daily with a meal.    . cyclobenzaprine (FLEXERIL) 10 MG tablet Take 1 tablet by mouth 3 (three) times daily.    . felodipine (PLENDIL) 10 MG 24 hr tablet TAKE ONE TABLET BY MOUTH DAILY 90 tablet 2  . furosemide (LASIX) 40 MG tablet TAKE ONE TABLET BY MOUTH DAILY 90 tablet 2  . lidocaine (XYLOCAINE) 5 % ointment Apply 1 application topically as needed. Daily for leg pain 50 g 3  . lisinopril (ZESTRIL) 40 MG tablet Take 40 mg by mouth daily.    Marland Kitchen loratadine (CLARITIN) 10 MG tablet Take 10 mg by mouth at bedtime.     . Menthol-Methyl Salicylate (THERA-GESIC) 0.5-15 % CREA Apply 1 application topically daily as needed (pain).    . metFORMIN (GLUCOPHAGE) 1000 MG tablet Take 1 tablet (1,000 mg total) by mouth 2 (two) times  daily with a meal. 180 tablet 3  . mirabegron ER (MYRBETRIQ) 50 MG TB24 tablet Take 50 mg by mouth at bedtime.    . sertraline (ZOLOFT) 100 MG tablet Take 2 tablets (200 mg total) by mouth at bedtime. 180 tablet 3  . tamsulosin (FLOMAX) 0.4 MG CAPS capsule Take 2 capsules (0.8 mg total) by mouth daily after supper. 180 capsule 3  . traMADol (ULTRAM) 50 MG tablet Take by mouth every 6 (six) hours as needed.    Loura Pardon Salicylate (ASPERCREME EX) Apply 1 application topically daily as needed (pain).    . TURMERIC PO Take by mouth.     No current facility-administered medications on file prior to visit.    Allergies:   Allergies  Allergen Reactions  . Ibuprofen Other (See Comments)    TOLD NOT TO TAKE-PER CARDS  . Iodinated Diagnostic Agents Hives    Hives started 4 days after cta chest was performed, minimal relief w/ benadryl x 3 days, uncertain if reaction was actually iv contrast or other allergen,allergy tests to follow.wife will make [place] aware of results//a.cal[place]oun  . Quinolones Other (See Comments)     Physical Exam  Vitals:   03/28/21 1439  BP: (!) 156/77  Pulse: 67  Weight: (!) 309 lb (140.2 kg)  Height: 6\' 3"  (1.905 m)   Body mass index is 38.62 kg/m. No exam data present  General: Obese pleasant middle-age male, seated, in no evident distress but flat affect Head: head normocephalic and atraumatic.   Neck: supple with no carotid or supraclavicular bruits Cardiovascular: regular rate and rhythm, no murmurs Musculoskeletal: no deformity Skin:  no rash/petichiae Vascular:  Normal pulses all extremities  Neurologic Exam Mental Status: Awake and fully alert.  Fluent speech and language.  Oriented to place and time. Recent and remote memory intact. Attention span, concentration and fund of knowledge appropriate. Mood and affect flat. Cranial Nerves: Pupils  equal, briskly reactive to light. Extraocular movements full without nystagmus. Visual fields full to  confrontation. Hearing intact. Facial sensation intact.   Motor: Full strength in all tested extremities Sensory.: intact to touch , pinprick , position and vibratory sensation.  Coordination: Rapid alternating movements normal in all extremities. Finger-to-nose and heel-to-shin performed accurately bilaterally.   Gait and Station: Arises from chair without difficulty. Stance is normal. Gait demonstrates normal stride and balance without use of assistive device.  Able to heel, toe and tandem walk with mild difficulty.  Reflexes: 1+ and symmetric. Toes downgoing.      ASSESSMENT/PLAN: Jonathan Miles is a 60 y.o. year old male here with left pontine infarct on 12/17/2017 secondary to small vessel disease with residual gait impairment. Vascular risk factors include HTN, HLD, DM, OSA and CAD.  Diagnosed with prostate cancer 02/2020. COVID 19+ 09/2020 receiving monoclonal antibodies   Stroke: -Continue aspirin 325 mg daily and atorvastatin for secondary stroke prevention -Continue to follow with PCP for HTN, HLD and DM management as well as ongoing prescribing of atorvastatin -Maintain strict control of hypertension with blood pressure goal below 130/90, diabetes with hemoglobin A1c goal below 7% and cholesterol with LDL cholesterol (bad cholesterol) goal below 70 mg/dL.  Post stroke fatigue, chronic Post stroke depression/anxiety -Continue armodafinil 150 mg daily -refill recently provided -Referral placed to psychiatry for further management and recommendations  Severe OSA: -Advised to reschedule sleep study as previously canceled due to COVID -Discussed importance of continued nightly use for apnea management    Follow up in 6 months or call earlier if needed   CC:  Dalton Miles: Caren Macadam, MD     Frann Rider, AGNP-BC  [facility] [address] [place], [place] [address]  Phone [phone] Fax [phone]

## 2021-03-28 NOTE — Patient Instructions (Signed)
Continue armodafinil 150mg  daily  Referral placed to psychiatry    Follow-up in 6 months or call earlier if needed    Thank you for coming to see [place] at [facility]. I hope we have been able to provide you high quality care today.  You may receive a patient satisfaction survey over the next few weeks. We would appreciate your feedback and comments so that we may continue to improve ourselves and the health of our patients.

## 2021-04-01 NOTE — Progress Notes (Signed)
I agree with the above plan 

## 2021-04-17 ENCOUNTER — Encounter: Payer: Self-pay | Admitting: Adult Health

## 2021-05-29 ENCOUNTER — Other Ambulatory Visit: Payer: Self-pay

## 2021-05-29 ENCOUNTER — Ambulatory Visit (INDEPENDENT_AMBULATORY_CARE_PROVIDER_SITE_OTHER): Payer: HMO | Admitting: Physician Assistant

## 2021-05-29 ENCOUNTER — Encounter: Payer: Self-pay | Admitting: Physician Assistant

## 2021-05-29 VITALS — BP 160/88 | HR 60 | Ht 75.0 in | Wt 313.0 lb

## 2021-05-29 DIAGNOSIS — I5032 Chronic diastolic (congestive) heart failure: Secondary | ICD-10-CM | POA: Diagnosis not present

## 2021-05-29 DIAGNOSIS — Z9889 Other specified postprocedural states: Secondary | ICD-10-CM

## 2021-05-29 DIAGNOSIS — I2510 Atherosclerotic heart disease of native coronary artery without angina pectoris: Secondary | ICD-10-CM

## 2021-05-29 DIAGNOSIS — Q2543 Congenital aneurysm of aorta: Secondary | ICD-10-CM

## 2021-05-29 DIAGNOSIS — I719 Aortic aneurysm of unspecified site, without rupture: Secondary | ICD-10-CM

## 2021-05-29 DIAGNOSIS — I10 Essential (primary) hypertension: Secondary | ICD-10-CM

## 2021-05-29 DIAGNOSIS — I7121 Aneurysm of the ascending aorta, without rupture: Secondary | ICD-10-CM

## 2021-05-29 DIAGNOSIS — Z8673 Personal history of transient ischemic attack (TIA), and cerebral infarction without residual deficits: Secondary | ICD-10-CM

## 2021-05-29 LAB — BASIC METABOLIC PANEL
BUN/Creatinine Ratio: 21 (ref 10–24)
BUN: 15 mg/dL (ref 8–27)
CO2: 22 mmol/L (ref 20–29)
Calcium: 9.3 mg/dL (ref 8.6–10.2)
Chloride: 104 mmol/L (ref 96–106)
Creatinine, Ser: 0.73 mg/dL — ABNORMAL LOW (ref 0.76–1.27)
Glucose: 104 mg/dL — ABNORMAL HIGH (ref 65–99)
Potassium: 3.6 mmol/L (ref 3.5–5.2)
Sodium: 143 mmol/L (ref 134–144)
eGFR: 104 mL/min/{1.73_m2} (ref >59)

## 2021-05-29 MED ORDER — PREDNISONE 50 MG PO TABS: ORAL_TABLET | ORAL | 0 refills | Status: DC

## 2021-05-29 NOTE — Patient Instructions (Addendum)
Medication Instructions:  Your physician has recommended you make the following change in your medication: Increase furosemide to 80 mg daily for 2 days and then go back to 40 mg daily  *If you need a refill on your cardiac medications before your next appointment, please call your pharmacy*   Lab Work: Lab work to be done today--BMP If you have labs (blood work) drawn today and your tests are completely normal, you will receive your results only by: [place] (if you have MyChart) OR A paper copy in the mail If you have any lab test that is abnormal or we need to change your treatment, we will call you to review the results.   Testing/Procedures: Your physician has requested that you have an echocardiogram. Echocardiography is a painless test that uses sound waves to create images of your heart. It provides your doctor with information about the size and shape of your heart and how well your heart's chambers and valves are working. This procedure takes approximately one hour. There are no restrictions for this procedure.  Non-Cardiac CT Angiography (CTA), is a special type of CT scan that uses a computer to produce multi-dimensional views of major blood vessels throughout the body. In CT angiography, a contrast material is injected through an IV to help visualize the blood vessels  You will need to take prednisone and benadryl prior to this.  See instructions below   Follow-Up: At [facility], you and your health needs are our priority.  As part of our continuing mission to provide you with exceptional heart care, we have created designated Provider Care Teams.  These Care Teams include your primary Cardiologist (physician) and Advanced Practice Providers (APPs -  Physician Assistants and Nurse Practitioners) who all work together to provide you with the care you need, when you need it.  We recommend signing up for the patient portal called "MyChart".  Sign up information is  provided on this After Visit Summary.  MyChart is used to connect with patients for Virtual Visits (Telemedicine).  Patients are able to view lab/test results, encounter notes, upcoming appointments, etc.  Non-urgent messages can be sent to your provider as well.   To learn more about what you can do with MyChart, go to [email or web address].    Your next appointment:   6 month(s)  The format for your next appointment:   In Person  Provider:   You may see Fransico Him, MD or one of the following Advanced Practice Providers on your designated Care Team:   Melina Copa, PA-C Ermalinda Barrios, PA-C   Other Instructions  Prior to CT  Take prednisone 50 mg 13 hours, 7 hours and 1 hour prior to CT Scan.  Take benadryl 50 mg one hour prior to CT Scan.  You will need someone to drive you home from CT Scan

## 2021-05-29 NOTE — Progress Notes (Signed)
Cardiology Office Note:    Date:  05/29/2021   ID:  Jonathan Miles, DOB July 19, 1961, MRN [id number]  PCP:  Caren Macadam, MD  [facility] Cardiologist:  Fransico Him, MD  [facility] Electrophysiologist:  None   Chief Complaint: yearly follow up   History of Present Illness:    Jonathan Miles is a 60 y.o. 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 Halford Chessman) presents for follow-up.  Patient was doing well when last seen by Richardson Dopp, PA-C in July 2021.   Patient is here for follow-up.  Has not seem by Dr. Servando Snare this year.  Still pending sleep study.  Limited activity due to balance issue.  Try to get at 11-3998 steps per day.  Has intermittent orthopnea and lower extremity edema.  He denies chest pain.  Some shortness of breath with activity.  He eats high salt diet occasionally.  Blood pressure elevated today but has not took his medications.  Denies palpitation or melena.   Past Medical History:  Diagnosis Date   Alcohol abuse    Allergy    Anxiety    Aortic root dilatation ([place])    a. echo 4/12: EF 55-60%, mod to marked dilated ascending aortic root;   b. MRA 4/12: Aortic root 5.5 cm, dilation of left and right coronary cusps, trileaflet aortic valve // Echo 2/19: Moderate LVH, EF 55-60, normal wall motion, grade 1 diastolic dysfunction, mild AI, mildly dilated aortic root, moderate to severe LAE (aortic root 40 mm; ascending aorta 39 mm)    Arthritis    Asthma    ast attack in early 20's   CAD (coronary artery disease)    Cancer (HCC)    prostate cancer   Chest pain    Myoview 4/12: EF 62%, no ischemia or scar   CHF (congestive heart failure) (HCC)    Chronic pain    back and hands   Constipation    COPD (chronic obstructive pulmonary disease)  (HCC)    Depression    Diabetes mellitus    type 2   Difficulty speaking    Drug use    Enlarged prostate    Fatigue    GERD (gastroesophageal reflux disease)    modifies with diet   Hx of echocardiogram    Echo (2/16):  Mod LVH, EF 55-60%, mild to mod AI, mod LAE, mild RAE, Aortic root 38 mm, Asc aorta 43 mm   Hyperlipidemia    Hypertension    Joint pain    Knee pain, bilateral    Leg pain    ABIs 3/19: Normal (R 1.3; L 1.28)   Neuromuscular disorder (HCC)    left foot ? neuropathy    OSA (obstructive sleep apnea) 2006   .  Wears at times   Osteoarthritis    Palpitations    Sleep apnea    wears cpap  but having sleep study 08-16-2020; is not wearing now   Stroke [facility]) 2018    Past Surgical History:  Procedure Laterality Date   APPENDECTOMY  12/2016   CHOLECYSTECTOMY N/A 11/24/2018   Procedure: LAPAROSCOPIC CHOLECYSTECTOMY;  Surgeon: Stark Klein, MD;  Location: [facility];  Service: General;  Laterality: N/A;   COLONOSCOPY  2011   Colonscopy     CORONARY ARTERY BYPASS GRAFT  05/07/2012   Procedure:  CORONARY ARTERY BYPASS GRAFTING (CABG);  Surgeon: Grace Isaac, MD;  Location: [place];  Service: Open Heart Surgery;  Laterality: N/A;   KNEE ARTHROSCOPY Left 2010   Dr. Rosamaria Lints   LAPAROSCOPIC APPENDECTOMY N/A 01/06/2017   Procedure: APPENDECTOMY LAPAROSCOPIC;  Surgeon: Ralene Ok, MD;  Location: [place];  Service: General;  Laterality: N/A;   Retactment of fingers     as a child , sewed with sewing machine- Index and middle finger   UPPER GASTROINTESTINAL ENDOSCOPY      Current Medications: Current Meds  Medication Sig   acetaminophen (TYLENOL) 500 MG tablet TAKE 1-2 TABS EVERY 6 HOURS AS NEEDED FOR PAIN.  Do not take more than 4000 mg of Tylenol per day it can harm your liver.   Armodafinil 150 MG tablet TAKE ONE TABLET BY MOUTH DAILY   aspirin 325 MG EC tablet Take 1 tablet (325 mg total) by mouth at bedtime.   atorvastatin (LIPITOR) 80 MG tablet Take 1 tablet (80  mg total) by mouth daily at 6 PM.   carvedilol (COREG) 25 MG tablet Take 25 mg by mouth 2 (two) times daily with a meal.   cyclobenzaprine (FLEXERIL) 10 MG tablet Take 1 tablet by mouth 3 (three) times daily.   felodipine (PLENDIL) 10 MG 24 hr tablet TAKE ONE TABLET BY MOUTH DAILY   furosemide (LASIX) 40 MG tablet TAKE ONE TABLET BY MOUTH DAILY   lidocaine (XYLOCAINE) 5 % ointment Apply 1 application topically as needed. Daily for leg pain   lisinopril (ZESTRIL) 40 MG tablet Take 40 mg by mouth daily.   loratadine (CLARITIN) 10 MG tablet Take 10 mg by mouth at bedtime.    Menthol-Methyl Salicylate (THERA-GESIC) 0.5-15 % CREA Apply 1 application topically daily as needed (pain).   metFORMIN (GLUCOPHAGE) 1000 MG tablet Take 1 tablet (1,000 mg total) by mouth 2 (two) times daily with a meal.   mirabegron ER (MYRBETRIQ) 50 MG TB24 tablet Take 50 mg by mouth at bedtime.   predniSONE (DELTASONE) 50 MG tablet Take one tablet by mouth 13 hours, 7 hours and one hour prior to CT SCan   tamsulosin (FLOMAX) 0.4 MG CAPS capsule Take 2 capsules (0.8 mg total) by mouth daily after supper.   traMADol (ULTRAM) 50 MG tablet Take by mouth every 6 (six) hours as needed.   Trolamine Salicylate (ASPERCREME EX) Apply 1 application topically daily as needed (pain).   TURMERIC PO Take by mouth.     Allergies:   Ibuprofen, Iodinated diagnostic agents, and Quinolones   Social History   Socioeconomic History   Marital status: Married    Spouse name: Simmie Steighner   Number of children: 0   Years of education: 12th grade   Highest education level: Not on file  Occupational History   Occupation: Fish farm manager for [employer]    Employer: [employer].  Tobacco Use   Smoking status: Former    Packs/day: 1.00    Years: 13.00    Pack years: 13.00    Types: Cigarettes    Quit date: 01/23/1988    Years since quitting: 33.3   Smokeless tobacco: Never  Vaping Use   Vaping Use: Never used   Substance and Sexual Activity   Alcohol use: Not Currently    Comment: quit 1989   Drug use: No    Comment: previous cocaine and marijuana use, quit 1988   Sexual activity: Not Currently    Partners: Female  Other Topics Concern  Not on file  Social History Narrative   Married, lives in [place]. Therapist, occupational for [employer].    Pt is adopted. Unsure of his family medical history.   Social Determinants of Health   Financial Resource Strain: Not on file  Food Insecurity: Not on file  Transportation Needs: Not on file  Physical Activity: Not on file  Stress: Not on file  Social Connections: Not on file     Family History: The patient's family history is not on file. He was adopted.    ROS:   Please see the history of present illness.    All other systems reviewed and are negative.   EKGs/Labs/Other Studies Reviewed:    The following studies were reviewed today:  Echo 11/2017 Study Conclusions   - Left ventricle: The cavity size was normal. Wall thickness was    increased in a pattern of moderate LVH. Systolic function was    normal. The estimated ejection fraction was in the range of 55%    to 60%. Wall motion was normal; there were no regional wall    motion abnormalities. Doppler parameters are consistent with    abnormal left ventricular relaxation (grade 1 diastolic    dysfunction).  - Aortic valve: There was mild regurgitation. Valve area (VTI):    3.48 cm^2. Valve area (Vmax): 3.49 cm^2. Valve area (Vmean): 3.31    cm^2.  - Aortic root: The aortic root was mildly dilated.  - Left atrium: The atrium was moderately to severely dilated.   EKG:  EKG is ordered today.  The ekg ordered today demonstrates sinus rhythm at rate of 60 bpm  Recent Labs: No results found for requested labs within last 8760 hours.  Recent Lipid Panel    Component Value Date/Time   CHOL 146 09/03/2020 0746   TRIG 120 09/03/2020 0746   HDL 45 09/03/2020 0746   CHOLHDL  3.2 09/03/2020 0746   CHOLHDL 4.0 12/17/2017 0639   VLDL 18 12/17/2017 0639   LDLCALC 79 09/03/2020 0746    VS:  BP (!) 160/88   Pulse 60   Ht '6\' 3"'$  (1.905 m)   Wt (!) 313 lb (142 kg)   SpO2 97%   BMI 39.12 kg/m     Wt Readings from Last 3 Encounters:  05/29/21 (!) 313 lb (142 kg)  03/28/21 (!) 309 lb (140.2 kg)  02/18/21 (!) 311 lb (141.1 kg)     GEN:  Well nourished, well developed in no acute distress HEENT: Normal NECK: + JVD; No carotid bruits LYMPHATICS: No lymphadenopathy CARDIAC: RRR, no murmurs, rubs, gallops RESPIRATORY:  Clear to auscultation without rales, wheezing or rhonchi  ABDOMEN: Soft, non-tender, non-distended MUSCULOSKELETAL: trace to 1 + edema; No deformity  SKIN: Warm and dry NEUROLOGIC:  Alert and oriented x 3 PSYCHIATRIC:  Normal affect   ASSESSMENT AND PLAN:    CAD s/p single-vessel CABG at the time of her CAD repair in 2013 No chest pain.  Continue aspirin, statin and beta-blocker.  2.  Aortic root aneurysm s/p repair -He is due for CT angio of chest.  Will order.  Updated that Dr. Servando Snare has retired.  He is hesitant to see another Psychologist, sport and exercise.  3.  History of stroke -Followed by neurology  4.  Hypertension -Blood pressure elevated during office visit but has not took his antihypertensive this morning. -Advised to keep log of blood pressure  5.  Hyperlipidemia -Most recent LDL 86 on May 2022.  Continue statin.  6.  Acute on chronic diastolic heart failure -Evidence of volume overload on exam.  Could be excess salt related.  Increase Lasix for few days.  Get echocardiogram.  Medication Adjustments/Labs and Tests Ordered: Current medicines are reviewed at length with the patient today.  Concerns regarding medicines are outlined above.  Orders Placed This Encounter  Procedures   CT ANGIO CHEST AORTA W/CM & OR WO/CM   Basic Metabolic Panel (BMET)   EKG 12-Lead   ECHOCARDIOGRAM COMPLETE    Meds ordered this encounter  Medications    predniSONE (DELTASONE) 50 MG tablet    Sig: Take one tablet by mouth 13 hours, 7 hours and one hour prior to CT SCan    Dispense:  3 tablet    Refill:  0     Patient Instructions  Medication Instructions:  Your physician has recommended you make the following change in your medication: Increase furosemide to 80 mg daily for 2 days and then go back to 40 mg daily  *If you need a refill on your cardiac medications before your next appointment, please call your pharmacy*   Lab Work: Lab work to be done today--BMP If you have labs (blood work) drawn today and your tests are completely normal, you will receive your results only by: [place] (if you have MyChart) OR A paper copy in the mail If you have any lab test that is abnormal or we need to change your treatment, we will call you to review the results.   Testing/Procedures: Your physician has requested that you have an echocardiogram. Echocardiography is a painless test that uses sound waves to create images of your heart. It provides your doctor with information about the size and shape of your heart and how well your heart's chambers and valves are working. This procedure takes approximately one hour. There are no restrictions for this procedure.  Non-Cardiac CT Angiography (CTA), is a special type of CT scan that uses a computer to produce multi-dimensional views of major blood vessels throughout the body. In CT angiography, a contrast material is injected through an IV to help visualize the blood vessels  You will need to take prednisone and benadryl prior to this.  See instructions below   Follow-Up: At [facility], you and your health needs are our priority.  As part of our continuing mission to provide you with exceptional heart care, we have created designated Provider Care Teams.  These Care Teams include your primary Cardiologist (physician) and Advanced Practice Providers (APPs -  Physician Assistants and Nurse  Practitioners) who all work together to provide you with the care you need, when you need it.  We recommend signing up for the patient portal called "MyChart".  Sign up information is provided on this After Visit Summary.  MyChart is used to connect with patients for Virtual Visits (Telemedicine).  Patients are able to view lab/test results, encounter notes, upcoming appointments, etc.  Non-urgent messages can be sent to your provider as well.   To learn more about what you can do with MyChart, go to [email or web address].    Your next appointment:   6 month(s)  The format for your next appointment:   In Person  Provider:   You may see Fransico Him, MD or one of the following Advanced Practice Providers on your designated Care Team:   Melina Copa, PA-C Ermalinda Barrios, PA-C   Other Instructions  Prior to CT  Take prednisone 50 mg 13 hours, 7 hours and 1 hour prior to  CT Scan.  Take benadryl 50 mg one hour prior to CT Scan.  You will need someone to drive you home from CT Scan      Signed, Leanor Kail, PA  05/29/2021 12:29 PM    [facility]

## 2021-06-17 ENCOUNTER — Ambulatory Visit (HOSPITAL_COMMUNITY): Payer: HMO | Attending: Physician Assistant

## 2021-06-17 ENCOUNTER — Other Ambulatory Visit: Payer: Self-pay

## 2021-06-17 ENCOUNTER — Ambulatory Visit (INDEPENDENT_AMBULATORY_CARE_PROVIDER_SITE_OTHER)
Admission: RE | Admit: 2021-06-17 | Discharge: 2021-06-17 | Disposition: A | Discharge status: Home / Self Care | Payer: HMO | Source: Ambulatory Visit | Attending: Physician Assistant | Admitting: Physician Assistant

## 2021-06-17 DIAGNOSIS — I719 Aortic aneurysm of unspecified site, without rupture: Secondary | ICD-10-CM

## 2021-06-17 DIAGNOSIS — I7121 Aneurysm of the ascending aorta, without rupture: Secondary | ICD-10-CM

## 2021-06-17 DIAGNOSIS — I2510 Atherosclerotic heart disease of native coronary artery without angina pectoris: Secondary | ICD-10-CM | POA: Diagnosis not present

## 2021-06-17 DIAGNOSIS — Z9889 Other specified postprocedural states: Secondary | ICD-10-CM

## 2021-06-17 DIAGNOSIS — I5032 Chronic diastolic (congestive) heart failure: Secondary | ICD-10-CM | POA: Insufficient documentation

## 2021-06-17 LAB — ECHOCARDIOGRAM COMPLETE
Area-P 1/2: 2.82 cm2
P 1/2 time: 187 msec
S' Lateral: 3.1 cm

## 2021-06-17 MED ORDER — DIPHENHYDRAMINE HCL 50 MG/ML IJ SOLN: 50.0000 mg | Freq: Once | INTRAMUSCULAR | Status: DC

## 2021-06-17 MED ORDER — IOHEXOL 350 MG/ML SOLN
100.0000 mL | Freq: Once | INTRAVENOUS | Status: AC | PRN
  Administered 2021-06-17: 100 mL via INTRAVENOUS

## 2021-06-17 MED ORDER — DIPHENHYDRAMINE HCL 25 MG PO CAPS: 50.0000 mg | ORAL_CAPSULE | Freq: Once | ORAL | Status: DC

## 2021-06-17 MED ORDER — PREDNISONE 1 MG PO TABS: 50.0000 mg | ORAL_TABLET | Freq: Four times a day (QID) | ORAL | Status: DC

## 2021-06-19 NOTE — Progress Notes (Signed)
Pt has been made aware of normal result and verbalized understanding.  jw

## 2021-07-11 ENCOUNTER — Encounter (HOSPITAL_COMMUNITY): Payer: Self-pay | Admitting: Emergency Medicine

## 2021-07-11 ENCOUNTER — Other Ambulatory Visit: Payer: Self-pay

## 2021-07-11 ENCOUNTER — Ambulatory Visit (HOSPITAL_COMMUNITY)
Admission: EM | Admit: 2021-07-11 | Discharge: 2021-07-11 | Disposition: A | Discharge status: Home / Self Care | Payer: HMO | Attending: Emergency Medicine | Admitting: Emergency Medicine

## 2021-07-11 DIAGNOSIS — M25561 Pain in right knee: Secondary | ICD-10-CM | POA: Diagnosis not present

## 2021-07-11 MED ORDER — PREDNISONE 20 MG PO TABS: 40.0000 mg | ORAL_TABLET | Freq: Every day | ORAL | 0 refills | Status: DC

## 2021-07-11 NOTE — ED Provider Notes (Signed)
[place]    CSN: [id number] Arrival date & time: 07/11/21  [id number]      History   Chief Complaint Chief Complaint  Patient presents with   Knee Pain    Right    HPI Jonathan Miles is a 60 y.o. male.   Patient presents with right knee pain and swelling for 5 days.  Pain is constant and worsened with walking.  Endorses that pain was so severe last night that he had to crawl up his steps.  Has been using topical lidocaine, muscle relaxer for treatment with minimal improvement.  Denies numbness, tingling, precipitating event, prior trauma or injury.  History of osteoarthritis of bilateral knees.  Past Medical History:  Diagnosis Date   Alcohol abuse    Allergy    Anxiety    Aortic root dilatation ([place])    a. echo 4/12: EF 55-60%, mod to marked dilated ascending aortic root;   b. MRA 4/12: Aortic root 5.5 cm, dilation of left and right coronary cusps, trileaflet aortic valve // Echo 2/19: Moderate LVH, EF 55-60, normal wall motion, grade 1 diastolic dysfunction, mild AI, mildly dilated aortic root, moderate to severe LAE (aortic root 40 mm; ascending aorta 39 mm)    Arthritis    Asthma    ast attack in early 20's   CAD (coronary artery disease)    Cancer (HCC)    prostate cancer   Chest pain    Myoview 4/12: EF 62%, no ischemia or scar   CHF (congestive heart failure) (HCC)    Chronic pain    back and hands   Constipation    COPD (chronic obstructive pulmonary disease) (HCC)    Depression    Diabetes mellitus    type 2   Difficulty speaking    Drug use    Enlarged prostate    Fatigue    GERD (gastroesophageal reflux disease)    modifies with diet   Hx of echocardiogram    Echo (2/16):  Mod LVH, EF 55-60%, mild to mod AI, mod LAE, mild RAE, Aortic root 38 mm, Asc aorta 43 mm   Hyperlipidemia    Hypertension    Joint pain    Knee pain, bilateral    Leg pain    ABIs 3/19: Normal (R 1.3; L 1.28)   Neuromuscular disorder (HCC)    left foot ? neuropathy     OSA (obstructive sleep apnea) 2006   .  Wears at times   Osteoarthritis    Palpitations    Sleep apnea    wears cpap  but having sleep study 08-16-2020; is not wearing now   Stroke [facility]) 2018    Patient Active Problem List   Diagnosis Date Noted   Acute cholecystitis 11/23/2018   Late effect of stroke 12/26/2017   History of stroke 12/16/2017   BMI 36.0-36.9,adult 08/05/2017   Cholelithiasis 08/05/2017   Bilateral plantar fasciitis 08/05/2017   Situational mixed anxiety and depressive disorder 08/05/2017   Bilateral primary osteoarthritis of knee 07/01/2017   Asthma 01/26/2017   Overactive bladder 01/21/2017   BPH with obstruction/lower urinary tract symptoms 01/21/2017   S/P appendectomy 01/07/2017   Diverticulosis 11/24/2016   Type 2 diabetes mellitus without complication, without long-term current use of insulin ([place]) 10/31/2016   Aortic insufficiency 11/23/2012   Chronic diastolic CHF (congestive heart failure) ([place]) 11/23/2012   CAD (coronary artery disease) 11/27/2011   OSA (obstructive sleep apnea) 11/11/2011   Aortic root dilatation (HCC)    Hyperlipidemia  01/23/2011   Hypertension 01/23/2011    Past Surgical History:  Procedure Laterality Date   APPENDECTOMY  12/2016   CHOLECYSTECTOMY N/A 11/24/2018   Procedure: LAPAROSCOPIC CHOLECYSTECTOMY;  Surgeon: Stark Klein, MD;  [facility];  Service: General;  Laterality: N/A;   COLONOSCOPY  2011   Colonscopy     CORONARY ARTERY BYPASS GRAFT  05/07/2012   Procedure: CORONARY ARTERY BYPASS GRAFTING (CABG);  Surgeon: Grace Isaac, MD;  Location: [place];  Service: Open Heart Surgery;  Laterality: N/A;   KNEE ARTHROSCOPY Left 2010   Dr. Rosamaria Lints   LAPAROSCOPIC APPENDECTOMY N/A 01/06/2017   Procedure: APPENDECTOMY LAPAROSCOPIC;  Surgeon: Ralene Ok, MD;  Location: [place];  Service: General;  Laterality: N/A;   Retactment of fingers     as a child , sewed with sewing machine- Index and middle finger   UPPER  GASTROINTESTINAL ENDOSCOPY         Home Medications    Prior to Admission medications   Medication Sig Start Date End Date Taking? Authorizing Provider  predniSONE (DELTASONE) 20 MG tablet Take 2 tablets (40 mg total) by mouth daily. 07/11/21  Yes Kaydn Kumpf R, NP  acetaminophen (TYLENOL) 500 MG tablet TAKE 1-2 TABS EVERY 6 HOURS AS NEEDED FOR PAIN.  Do not take more than 4000 mg of Tylenol per day it can harm your liver. 11/26/18   Earnstine Regal, PA-C  Armodafinil 150 MG tablet TAKE ONE TABLET BY MOUTH DAILY 02/27/21   Frann Rider, NP  aspirin 325 MG EC tablet Take 1 tablet (325 mg total) by mouth at bedtime. 02/20/18   Harrison Mons, PA  atorvastatin (LIPITOR) 80 MG tablet Take 1 tablet (80 mg total) by mouth daily at 6 PM. 01/21/18   Harrison Mons, PA  carvedilol (COREG) 25 MG tablet Take 25 mg by mouth 2 (two) times daily with a meal.    [provider]  cyclobenzaprine (FLEXERIL) 10 MG tablet Take 1 tablet by mouth 3 (three) times daily. 02/01/21   [provider]  felodipine (PLENDIL) 10 MG 24 hr tablet TAKE ONE TABLET BY MOUTH DAILY 11/03/17   Harrison Mons, PA  furosemide (LASIX) 40 MG tablet TAKE ONE TABLET BY MOUTH DAILY 11/03/17   Harrison Mons, PA  lidocaine (XYLOCAINE) 5 % ointment Apply 1 application topically as needed. Daily for leg pain 01/23/18   Harrison Mons, PA  lisinopril (ZESTRIL) 40 MG tablet Take 40 mg by mouth daily. 02/05/21   [provider]  loratadine (CLARITIN) 10 MG tablet Take 10 mg by mouth at bedtime.     [provider]  Menthol-Methyl Salicylate (THERA-GESIC) 0.5-15 % CREA Apply 1 application topically daily as needed (pain).    [provider]  metFORMIN (GLUCOPHAGE) 1000 MG tablet Take 1 tablet (1,000 mg total) by mouth 2 (two) times daily with a meal. 01/21/18   Jeffery, Domingo Mend, PA  mirabegron ER (MYRBETRIQ) 50 MG TB24 tablet Take 50 mg by mouth at bedtime.    [provider]  tamsulosin  (FLOMAX) 0.4 MG CAPS capsule Take 2 capsules (0.8 mg total) by mouth daily after supper. 09/15/17   Harrison Mons, PA  traMADol (ULTRAM) 50 MG tablet Take by mouth every 6 (six) hours as needed.    [provider]  Trolamine Salicylate (ASPERCREME EX) Apply 1 application topically daily as needed (pain).    [provider]  TURMERIC PO Take by mouth.    [provider]    Family History Family History  Adopted: Yes    Social History Social History   Tobacco Use   Smoking status: Former    Packs/day: 1.00    Years: 13.00    Pack years: 13.00    Types: Cigarettes    Quit date: 01/23/1988    Years since quitting: 33.4   Smokeless tobacco: Never  Vaping Use   Vaping Use: Never used  Substance Use Topics   Alcohol use: Not Currently    Comment: quit 1989   Drug use: No    Comment: previous cocaine and marijuana use, quit 1988     Allergies   Ibuprofen, Iodinated diagnostic agents, and Quinolones   Review of Systems Review of Systems  Constitutional: Negative.   Respiratory: Negative.    Cardiovascular: Negative.   Musculoskeletal:  Positive for joint swelling. Negative for arthralgias, back pain, gait problem, myalgias, neck pain and neck stiffness.  Skin: Negative.   Neurological: Negative.     Physical Exam Triage Vital Signs ED Triage Vitals  Enc Vitals Group     BP 07/11/21 1026 (!) 180/98     Pulse Rate 07/11/21 1026 67     Resp 07/11/21 1026 17     Temp 07/11/21 1026 99.3 F (37.4 C)     Temp Source 07/11/21 1026 Oral     SpO2 07/11/21 1026 95 %     Weight --      Height --      Head Circumference --      Peak Flow --      Pain Score 07/11/21 1025 8     Pain Loc --      Pain Edu? --      Excl. in [place]? --    No data found.  Updated Vital Signs BP (!) 180/98 (BP Location: Right Arm)   Pulse 67   Temp 99.3 F (37.4 C) (Oral)   Resp 17   SpO2 95%   Visual Acuity Right Eye Distance:   Left Eye Distance:   Bilateral  Distance:    Right Eye Near:   Left Eye Near:    Bilateral Near:     Physical Exam Constitutional:      Appearance: Normal appearance. He is normal weight.  HENT:     Head: Normocephalic.  Eyes:     Extraocular Movements: Extraocular movements intact.  Pulmonary:     Effort: Pulmonary effort is normal.  Musculoskeletal:       Legs:     Comments: No tenderness present, mild swelling with possible effusion at the lateral anterior aspect, 2+ popliteal pulse, range of motion intact, able to bear weight , walked to exam room without assistance  Skin:    General: Skin is warm and dry.  Neurological:     Mental Status: He is alert and oriented to person, place, and time. Mental status is at baseline.  Psychiatric:        Mood and Affect: Mood normal.        Behavior: Behavior normal.     UC Treatments / Results  Labs (all labs ordered are listed, but only abnormal results are displayed) Labs Reviewed - No data to display  EKG   Radiology No results found.  Procedures Procedures (including critical care time)  Medications Ordered in UC Medications - No data to display  Initial Impression / Assessment and Plan / UC Course  I have reviewed the triage vital signs and the nursing notes.  Pertinent labs & imaging results that were available  during my care of the patient were reviewed by me and considered in my medical decision making (see chart for details).  Acute pain of right knee  Prednisone 40 mg daily with food, has type 2 DM, taking medications a prescribed, last A1C 6.0, discussed temporary side effect of medication, in agreement to monitor blood sugars while use of steroid 2.  Advised continued use of muscle relaxer, Tylenol and topical lidocaine   Can use heating pad in 15-minute intervals over affected area  For persistent or reoccurring pain patient advised to follow-up with his orthopedic specialist for reevaluation Final Clinical Impressions(s) / UC Diagnoses    Final diagnoses:  Acute pain of right knee     Discharge Instructions      Starting today may be given prednisone which morning with food for the next 5 days  Can continue use of muscle relaxer, Tylenol, lidocaine as needed for additional comfort  Can apply heating pad in 15 minutes affected area for pain  If symptoms are persistent or reoccurring and I would recommend follow-up with orthopedic specialist for evaluation and suggestions for long-term management   ED Prescriptions     Medication Sig Dispense Auth. Provider   predniSONE (DELTASONE) 20 MG tablet Take 2 tablets (40 mg total) by mouth daily. 10 tablet Hans Eden, NP      PDMP not reviewed this encounter.   Hans Eden, NP 07/11/21 1047

## 2021-07-11 NOTE — Discharge Instructions (Addendum)
Starting today may be given prednisone which morning with food for the next 5 days  Can continue use of muscle relaxer, Tylenol, lidocaine as needed for additional comfort  Can apply heating pad in 15 minutes affected area for pain  If symptoms are persistent or reoccurring and I would recommend follow-up with orthopedic specialist for evaluation and suggestions for long-term management

## 2021-07-11 NOTE — ED Triage Notes (Signed)
Pt presents with right knee pain xs 1 week.

## 2021-09-09 ENCOUNTER — Other Ambulatory Visit: Payer: Self-pay | Admitting: Thoracic Surgery (Cardiothoracic Vascular Surgery)

## 2021-09-09 DIAGNOSIS — I77810 Thoracic aortic ectasia: Secondary | ICD-10-CM

## 2021-09-23 ENCOUNTER — Encounter: Payer: Self-pay | Admitting: Podiatry

## 2021-09-23 ENCOUNTER — Ambulatory Visit (INDEPENDENT_AMBULATORY_CARE_PROVIDER_SITE_OTHER): Payer: HMO | Admitting: Podiatry

## 2021-09-23 ENCOUNTER — Other Ambulatory Visit: Payer: Self-pay

## 2021-09-23 DIAGNOSIS — M79675 Pain in left toe(s): Secondary | ICD-10-CM

## 2021-09-23 DIAGNOSIS — E119 Type 2 diabetes mellitus without complications: Secondary | ICD-10-CM | POA: Diagnosis not present

## 2021-09-23 DIAGNOSIS — B351 Tinea unguium: Secondary | ICD-10-CM

## 2021-09-23 DIAGNOSIS — M79674 Pain in right toe(s): Secondary | ICD-10-CM | POA: Diagnosis not present

## 2021-09-23 NOTE — Progress Notes (Signed)
Subjective:   Patient ID: Jonathan Miles, male   DOB: 60 y.o.   MRN: [id number]   HPI Patient presents with caregiver long-term diabetes who has thick yellow brittle nailbeds 1-5 both feet that he cannot take care of neuro   ROS      Objective:  Physical Exam  Vascular status intact negative Bevelyn Buckles' sign noted with patient found to have good sharp dull vibratory with only moderate diminishment.  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

## 2021-10-03 ENCOUNTER — Ambulatory Visit: Payer: HMO | Admitting: Adult Health

## 2021-10-08 ENCOUNTER — Telehealth: Payer: Self-pay

## 2021-10-08 MED ORDER — PREDNISONE 50 MG PO TABS
ORAL_TABLET | ORAL | 0 refills | Status: DC
Start: 2021-10-08 — End: 2021-12-04

## 2021-10-08 NOTE — Telephone Encounter (Signed)
Phone call to patient to review instructions for 13 hr prep for CT w/ contrast on 10/11/21  at 10:40AM. Prescription called into [place]. Pt aware and verbalized understanding of instructions. Patient will take home supply of Benadryl, patient no longer taking Prednisone listed on medication reconciliation.    Prescription: Pt to take 50 mg of prednisone on 10/10/21 at 9:40PM, 50 mg of prednisone on 10/11/21 at 03:40AM, and 50 mg of prednisone on 10/11/21 at 09:40AM. Pt is also to take 50 mg of benadryl on 10/11/21 at 09:40AM. Please call [phone] with any questions.

## 2021-10-11 ENCOUNTER — Ambulatory Visit
Admission: RE | Admit: 2021-10-11 | Discharge: 2021-10-11 | Disposition: A | Discharge status: Home / Self Care | Payer: HMO | Source: Ambulatory Visit | Attending: Thoracic Surgery (Cardiothoracic Vascular Surgery) | Admitting: Thoracic Surgery (Cardiothoracic Vascular Surgery)

## 2021-10-11 DIAGNOSIS — I77810 Thoracic aortic ectasia: Secondary | ICD-10-CM

## 2021-10-11 MED ORDER — IOPAMIDOL (ISOVUE-370) INJECTION 76%
75.0000 mL | Freq: Once | INTRAVENOUS | Status: AC | PRN
  Administered 2021-10-11: 75 mL via INTRAVENOUS

## 2021-10-15 ENCOUNTER — Ambulatory Visit (INDEPENDENT_AMBULATORY_CARE_PROVIDER_SITE_OTHER): Payer: HMO | Admitting: Thoracic Surgery (Cardiothoracic Vascular Surgery)

## 2021-10-15 ENCOUNTER — Other Ambulatory Visit: Payer: Self-pay

## 2021-10-15 VITALS — BP 162/93 | HR 75 | Resp 20 | Ht 75.0 in | Wt 313.0 lb

## 2021-10-15 DIAGNOSIS — I77810 Thoracic aortic ectasia: Secondary | ICD-10-CM

## 2021-10-15 NOTE — Progress Notes (Signed)
[address]       [place] [address]             [phone]     HPI: [name] returns for follow-up of his ascending aortic aneurysm.    Jonathan Miles is a 60 year old man with a history of a valve sparing aortic root replacement and CABG x1 by EBG in 2013.  He has been followed with echocardiograms and CT angiograms of the chest since then.  He last saw Dr. Servando Snare in 2020.  His past medical history is also significant for stroke, hypertension, hyperlipidemia, obesity, obstructive sleep apnea, prostate cancer, anxiety and depression, type 2 diabetes, and osteoarthritis.  He has been having issues with his prostate.  He sees a urologist at [facility].  He is not having any chest pain, pressure, or tightness.  He did not take his blood pressure medication this morning.  He had an echocardiogram back in August which did not show any problems with his aortic valve and had normal LV function.  Past Medical History:  Diagnosis Date   Alcohol abuse    Allergy    Anxiety    Aortic root dilatation ([place])    a. echo 4/12: EF 55-60%, mod to marked dilated ascending aortic root;   b. MRA 4/12: Aortic root 5.5 cm, dilation of left and right coronary cusps, trileaflet aortic valve // Echo 2/19: Moderate LVH, EF 55-60, normal wall motion, grade 1 diastolic dysfunction, mild AI, mildly dilated aortic root, moderate to severe LAE (aortic root 40 mm; ascending aorta 39 mm)    Arthritis    Asthma    ast attack in early 20's   CAD (coronary artery disease)    Cancer (HCC)    prostate cancer   Chest pain    Myoview 4/12: EF 62%, no ischemia or scar   CHF (congestive heart failure) (HCC)    Chronic pain    back and hands   Constipation    COPD (chronic obstructive pulmonary disease) (HCC)    Depression    Diabetes mellitus    type 2   Difficulty speaking    Drug use    Enlarged prostate    Fatigue    GERD (gastroesophageal reflux disease)    modifies with diet   Hx  of echocardiogram    Echo (2/16):  Mod LVH, EF 55-60%, mild to mod AI, mod LAE, mild RAE, Aortic root 38 mm, Asc aorta 43 mm   Hyperlipidemia    Hypertension    Joint pain    Knee pain, bilateral    Leg pain    ABIs 3/19: Normal (R 1.3; L 1.28)   Neuromuscular disorder (HCC)    left foot ? neuropathy    OSA (obstructive sleep apnea) 2006   .  Wears at times   Osteoarthritis    Palpitations    Sleep apnea    wears cpap  but having sleep study 08-16-2020; is not wearing now   Stroke [facility]) 2018    Current Outpatient Medications  Medication Sig Dispense Refill   acetaminophen (TYLENOL) 500 MG tablet TAKE 1-2 TABS EVERY 6 HOURS AS NEEDED FOR PAIN.  Do not take more than 4000 mg of Tylenol per day it can harm your liver. 30 tablet 0   Armodafinil 150 MG tablet TAKE ONE TABLET BY MOUTH DAILY 30 tablet 5   aspirin 325 MG EC tablet Take 1 tablet (325 mg total) by mouth at bedtime. [place]  tablet 3   atorvastatin (LIPITOR) 80 MG tablet Take 1 tablet (80 mg total) by mouth daily at 6 PM. 90 tablet 3   carvedilol (COREG) 25 MG tablet Take 25 mg by mouth 2 (two) times daily with a meal.     cyclobenzaprine (FLEXERIL) 10 MG tablet Take 1 tablet by mouth 3 (three) times daily.     felodipine (PLENDIL) 10 MG 24 hr tablet TAKE ONE TABLET BY MOUTH DAILY 90 tablet 2   furosemide (LASIX) 40 MG tablet TAKE ONE TABLET BY MOUTH DAILY 90 tablet 2   lidocaine (XYLOCAINE) 5 % ointment Apply 1 application topically as needed. Daily for leg pain 50 g 3   lisinopril (ZESTRIL) 40 MG tablet Take 40 mg by mouth daily.     loratadine (CLARITIN) 10 MG tablet Take 10 mg by mouth at bedtime.      Menthol-Methyl Salicylate (THERA-GESIC) 0.5-15 % CREA Apply 1 application topically daily as needed (pain).     metFORMIN (GLUCOPHAGE) 1000 MG tablet Take 1 tablet (1,000 mg total) by mouth 2 (two) times daily with a meal. 180 tablet 3   predniSONE (DELTASONE) 20 MG tablet Take 2 tablets (40 mg total) by mouth daily. 10 tablet 0    predniSONE (DELTASONE) 50 MG tablet Pt to take 50 mg of prednisone on 10/10/21 at 9:40PM, 50 mg of prednisone on 10/11/21 at 03:40AM, and 50 mg of prednisone on 10/11/21 at 09:40AM. Pt is also to take 50 mg of benadryl on 10/11/21 at 09:40AM. Please call [phone] with any questions. 3 tablet 0   traMADol (ULTRAM) 50 MG tablet Take by mouth every 6 (six) hours as needed.     Trolamine Salicylate (ASPERCREME EX) Apply 1 application topically daily as needed (pain).     TURMERIC PO Take by mouth.     mirabegron ER (MYRBETRIQ) 50 MG TB24 tablet Take 50 mg by mouth at bedtime.     tamsulosin (FLOMAX) 0.4 MG CAPS capsule Take 2 capsules (0.8 mg total) by mouth daily after supper. 180 capsule 3   No current facility-administered medications for this visit.    Physical Exam BP (!) 162/93 (BP Location: Right Arm, Patient Position: Sitting)    Pulse 75    Resp 20    Ht 6\' 3"  (1.905 m)    Wt (!) 313 lb (142 kg)    SpO2 94% Comment: RA   BMI 39.1 kg/m  60 year old man in no acute distress Alert and oriented x3 Cardiac regular rate and rhythm normal S1 and S2, no murmur No carotid bruits Lungs clear with equal breath sounds bilaterally  Diagnostic Tests: CT ANGIOGRAPHY CHEST WITH CONTRAST   TECHNIQUE: Multidetector CT imaging of the chest was performed using the standard protocol during bolus administration of intravenous contrast. Multiplanar CT image reconstructions and MIPs were obtained to evaluate the vascular anatomy.   13 hour contrast allergy prep.  No adverse reaction.   CONTRAST:  59mL ISOVUE-370 IOPAMIDOL (ISOVUE-370) INJECTION 76%   COMPARISON:  None.   FINDINGS: Cardiovascular:  Post CABG and aortic root replacement anatomy.   Ascending thoracic aorta measures 39 mm at the level of the pulmonary outflow tract (image 62/4). No dissection or aneurysm.   Normal great vessels with bovine arch anatomy. Descending thoracic aorta normal.   Mediastinum/Nodes: No axillary or  supraclavicular adenopathy. No mediastinal or hilar adenopathy. No pericardial fluid. Esophagus normal.   Lungs/Pleura: No suspicious pulmonary nodules. Normal pleural. Airways normal.   Upper Abdomen: Limited view of the  liver, kidneys, pancreas are unremarkable. Normal adrenal glands.   Musculoskeletal: No aggressive osseous lesion.   Review of the MIP images confirms the above findings.   IMPRESSION: 1. Stable postsurgical anatomy with graft repair of the ascending thoracic aorta and coronary artery bypass. No aneurysm or dissection. No interval change. 2. No acute pulmonary parenchymal findings.     Electronically Signed   By: Suzy Bouchard M.D.   On: 10/11/2021 11:25 I personally reviewed the CT images.  His ascending aorta distal to the graft is enlarged at about 4.3 to 4.5 cm.  Impression: Jonathan Miles is a 60 year old man with a past medical history significant for stroke, hypertension, hyperlipidemia, obesity, obstructive sleep apnea, prostate cancer, anxiety and depression, type 2 diabetes, and osteoarthritis.  He had a valve sparing root replacement by Dr. Servando Snare in 2013.  He had CABG x1 at the time of that surgery.  His valve is good by echo.  His aortic root is unchanged.  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  Needs continued annual follow-up with that area.  Hypertension-blood pressure elevated today.  He says he did not take his medications this morning.  The importance of regular compliance with medications and monitoring of his blood pressure was emphasized.  Prostate CA-he is followed by urology at [facility].  Plan: Return in 1 year with CT angio of chest  I spent 35 minutes in review of records, images, and in consultation with Mr. Shiner today. Melrose Nakayama, MD  [facility] [phone]

## 2021-10-23 ENCOUNTER — Other Ambulatory Visit: Payer: Self-pay | Admitting: Adult Health

## 2021-11-13 DIAGNOSIS — C61 Malignant neoplasm of prostate: Secondary | ICD-10-CM | POA: Diagnosis not present

## 2021-11-13 DIAGNOSIS — R972 Elevated prostate specific antigen [PSA]: Secondary | ICD-10-CM | POA: Diagnosis not present

## 2021-11-13 DIAGNOSIS — R338 Other retention of urine: Secondary | ICD-10-CM | POA: Diagnosis not present

## 2021-11-13 DIAGNOSIS — N401 Enlarged prostate with lower urinary tract symptoms: Secondary | ICD-10-CM | POA: Diagnosis not present

## 2021-11-13 DIAGNOSIS — N138 Other obstructive and reflux uropathy: Secondary | ICD-10-CM | POA: Diagnosis not present

## 2021-12-03 DIAGNOSIS — C61 Malignant neoplasm of prostate: Secondary | ICD-10-CM | POA: Diagnosis not present

## 2021-12-03 DIAGNOSIS — Z8546 Personal history of malignant neoplasm of prostate: Secondary | ICD-10-CM | POA: Diagnosis not present

## 2021-12-03 DIAGNOSIS — R590 Localized enlarged lymph nodes: Secondary | ICD-10-CM | POA: Diagnosis not present

## 2021-12-03 DIAGNOSIS — R972 Elevated prostate specific antigen [PSA]: Secondary | ICD-10-CM | POA: Diagnosis not present

## 2021-12-03 DIAGNOSIS — N400 Enlarged prostate without lower urinary tract symptoms: Secondary | ICD-10-CM | POA: Diagnosis not present

## 2021-12-05 ENCOUNTER — Encounter: Payer: Self-pay | Admitting: Cardiology

## 2021-12-05 ENCOUNTER — Ambulatory Visit: Payer: Medicare HMO | Admitting: Cardiology

## 2021-12-05 ENCOUNTER — Other Ambulatory Visit: Payer: Self-pay

## 2021-12-05 VITALS — BP 142/88 | HR 70 | Ht 75.0 in | Wt 303.0 lb

## 2021-12-05 DIAGNOSIS — Z9889 Other specified postprocedural states: Secondary | ICD-10-CM

## 2021-12-05 DIAGNOSIS — I10 Essential (primary) hypertension: Secondary | ICD-10-CM | POA: Diagnosis not present

## 2021-12-05 DIAGNOSIS — I5032 Chronic diastolic (congestive) heart failure: Secondary | ICD-10-CM

## 2021-12-05 DIAGNOSIS — E7800 Pure hypercholesterolemia, unspecified: Secondary | ICD-10-CM

## 2021-12-05 DIAGNOSIS — I2510 Atherosclerotic heart disease of native coronary artery without angina pectoris: Secondary | ICD-10-CM | POA: Diagnosis not present

## 2021-12-05 MED ORDER — CARVEDILOL 25 MG PO TABS: 25.0000 mg | ORAL_TABLET | Freq: Two times a day (BID) | ORAL | 3 refills | Status: DC

## 2021-12-05 MED ORDER — ATORVASTATIN CALCIUM 80 MG PO TABS: 80.0000 mg | ORAL_TABLET | Freq: Every day | ORAL | 3 refills | Status: DC

## 2021-12-05 MED ORDER — EZETIMIBE 10 MG PO TABS: 10.0000 mg | ORAL_TABLET | Freq: Every day | ORAL | 3 refills | Status: DC

## 2021-12-05 MED ORDER — FUROSEMIDE 40 MG PO TABS: 40.0000 mg | ORAL_TABLET | Freq: Every day | ORAL | 3 refills | Status: DC

## 2021-12-05 NOTE — Patient Instructions (Signed)
Medication Instructions:  Your physician has recommended you make the following change in your medication: 1) START Zetia 10mg  daily *If you need a refill on your cardiac medications before your next appointment, please call your pharmacy*   Lab Work: IN 6 WEEKS: FASTING lipids and ALT If you have labs (blood work) drawn today and your tests are completely normal, you will receive your results only by: [place] (if you have MyChart) OR A paper copy in the mail If you have any lab test that is abnormal or we need to change your treatment, we will call you to review the results.   Follow-Up: At [facility], you and your health needs are our priority.  As part of our continuing mission to provide you with exceptional heart care, we have created designated Provider Care Teams.  These Care Teams include your primary Cardiologist (physician) and Advanced Practice Providers (APPs -  Physician Assistants and Nurse Practitioners) who all work together to provide you with the care you need, when you need it.   Your next appointment:   1 year(s)  The format for your next appointment:   In Person  Provider:   Fransico Him, MD

## 2021-12-05 NOTE — Progress Notes (Signed)
Cardiology Office Note:    Date:  12/05/2021   ID:  Jonathan Miles, DOB 26-May-1961, MRN [id number]  PCP:  Jonathan Macadam, MD  [facility] Cardiologist:  Jonathan Him, MD  [facility] Electrophysiologist:  None   Chief Complaint: yearly follow up   History of Present Illness:    Jonathan Miles is a 61 y.o. 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 Halford Miles) presents for follow-up.  He is here today for followup and is doing well.  He has chronic DOE that is very stable.  He denies any chest pain or pressure, PND, orthopnea, LE edema, palpitations or syncope. He is compliant with his meds and is tolerating meds with no SE.     Past Medical History:  Diagnosis Date   Alcohol abuse    Allergy    Anxiety    Aortic root dilatation ([place])    a. echo 4/12: EF 55-60%, mod to marked dilated ascending aortic root;   b. MRA 4/12: Aortic root 5.5 cm, dilation of left and right coronary cusps, trileaflet aortic valve // Echo 2/19: Moderate LVH, EF 55-60, normal wall motion, grade 1 diastolic dysfunction, mild AI, mildly dilated aortic root, moderate to severe LAE (aortic root 40 mm; ascending aorta 39 mm)    Arthritis    Asthma    ast attack in early 20's   CAD (coronary artery disease)    Cancer (HCC)    prostate cancer   Chest pain    Myoview 4/12: EF 62%, no ischemia or scar   CHF (congestive heart failure) (HCC)    Chronic pain    back and hands   Constipation    COPD (chronic obstructive pulmonary disease) (HCC)    Depression    Diabetes mellitus    type 2   Difficulty speaking    Drug use    Enlarged prostate    Fatigue    GERD (gastroesophageal reflux disease)    modifies with diet   Hx of echocardiogram    Echo (2/16):  Mod LVH, EF 55-60%, mild to mod  AI, mod LAE, mild RAE, Aortic root 38 mm, Asc aorta 43 mm   Hyperlipidemia    Hypertension    Joint pain    Knee pain, bilateral    Leg pain    ABIs 3/19: Normal (R 1.3; L 1.28)   Neuromuscular disorder (HCC)    left foot ? neuropathy    OSA (obstructive sleep apnea) 2006   .  Wears at times   Osteoarthritis    Palpitations    Sleep apnea    wears cpap  but having sleep study 08-16-2020; is not wearing now   Stroke [facility]) 2018    Past Surgical History:  Procedure Laterality Date   APPENDECTOMY  12/2016   CHOLECYSTECTOMY N/A 11/24/2018   Procedure: LAPAROSCOPIC CHOLECYSTECTOMY;  Surgeon: Jonathan Klein, MD;  Location: [facility];  Service: General;  Laterality: N/A;   COLONOSCOPY  2011   Colonscopy     CORONARY ARTERY BYPASS GRAFT  05/07/2012   Procedure: CORONARY ARTERY BYPASS GRAFTING (CABG);  Surgeon: Jonathan Isaac, MD;  Location: [facility];  Service: Open Heart Surgery;  Laterality: N/A;   KNEE ARTHROSCOPY Left 2010   Dr. Rosamaria Miles   LAPAROSCOPIC APPENDECTOMY N/A 01/06/2017   Procedure: APPENDECTOMY LAPAROSCOPIC;  Surgeon: Jonathan Ok, MD;  Location: [facility];  Service: General;  Laterality: N/A;   Retactment of fingers     as a child , sewed with sewing machine- Index and middle finger   UPPER GASTROINTESTINAL ENDOSCOPY      Current Medications: Current Meds  Medication Sig   acetaminophen (TYLENOL) 500 MG tablet TAKE 1-2 TABS EVERY 6 HOURS AS NEEDED FOR PAIN.  Do not take more than 4000 mg of Tylenol per day it can harm your liver.   Armodafinil 150 MG tablet TAKE ONE TABLET BY MOUTH DAILY   aspirin 325 MG EC tablet Take 1 tablet (325 mg total) by mouth at bedtime.   atorvastatin (LIPITOR) 80 MG tablet Take 1 tablet (80 mg total) by mouth daily at 6 PM.   carvedilol (COREG) 25 MG tablet Take 25 mg by mouth 2 (two) times daily with a meal.   cyclobenzaprine (FLEXERIL) 10 MG tablet Take 1 tablet by mouth 3 (three) times daily.   felodipine (PLENDIL) 10 MG 24 hr tablet TAKE ONE  TABLET BY MOUTH DAILY   furosemide (LASIX) 40 MG tablet TAKE ONE TABLET BY MOUTH DAILY   lidocaine (XYLOCAINE) 5 % ointment Apply 1 application topically as needed. Daily for leg pain   lisinopril (ZESTRIL) 40 MG tablet Take 40 mg by mouth daily.   loratadine (CLARITIN) 10 MG tablet Take 10 mg by mouth at bedtime.    Menthol-Methyl Salicylate (THERA-GESIC) 0.5-15 % CREA Apply 1 application topically daily as needed (pain).   metFORMIN (GLUCOPHAGE) 1000 MG tablet Take 1 tablet (1,000 mg total) by mouth 2 (two) times daily with a meal.   traMADol (ULTRAM) 50 MG tablet Take by mouth every 6 (six) hours as needed.   Trolamine Salicylate (ASPERCREME EX) Apply 1 application topically daily as needed (pain).   TURMERIC PO Take by mouth.     Allergies:   Ibuprofen, Iodinated contrast media, and Quinolones   Social History   Socioeconomic History   Marital status: Married    Spouse name: Jonathan Miles   Number of children: 0   Years of education: 12th grade   Highest education level: Not on file  Occupational History   Occupation: Fish farm manager for [employer]    Employer: [employer].  Tobacco Use   Smoking status: Former    Packs/day: 1.00    Years: 13.00    Pack years: 13.00    Types: Cigarettes    Quit date: 01/23/1988    Years since quitting: 33.8   Smokeless tobacco: Never  Vaping Use   Vaping Use: Never used  Substance and Sexual Activity   Alcohol use: Not Currently    Comment: quit 1989   Drug use: No    Comment: previous cocaine and marijuana use, quit 1988   Sexual activity: Not Currently    Partners: Female  Other Topics Concern   Not on file  Social History Narrative   Married, lives in [place]. Therapist, occupational for [employer].    Pt is adopted. Unsure of his family medical history.   Social Determinants of Health   Financial Resource Strain: Not on file  Food Insecurity: Not on file  Transportation Needs: Not on file   Physical Activity: Not on file  Stress: Not on file  Social Connections: Not on file     Family History: The patient's family history is not on file. He was adopted.    ROS:   Please see the history of  present illness.    All other systems reviewed and are negative.   EKGs/Labs/Other Studies Reviewed:    The following studies were reviewed today:  Echo 11/2017 Study Conclusions   - Left ventricle: The cavity size was normal. Wall thickness was    increased in a pattern of moderate LVH. Systolic function was    normal. The estimated ejection fraction was in the range of 55%    to 60%. Wall motion was normal; there were no regional wall    motion abnormalities. Doppler parameters are consistent with    abnormal left ventricular relaxation (grade 1 diastolic    dysfunction).  - Aortic valve: There was mild regurgitation. Valve area (VTI):    3.48 cm^2. Valve area (Vmax): 3.49 cm^2. Valve area (Vmean): 3.31    cm^2.  - Aortic root: The aortic root was mildly dilated.  - Left atrium: The atrium was moderately to severely dilated.   EKG:  EKG is ordered today.  The ekg ordered today demonstrates sinus rhythm at rate of 60 bpm  Recent Labs: 05/29/2021: BUN 15; Creatinine, Ser 0.73; Potassium 3.6; Sodium 143  Recent Lipid Panel    Component Value Date/Time   CHOL 146 09/03/2020 0746   TRIG 120 09/03/2020 0746   HDL 45 09/03/2020 0746   CHOLHDL 3.2 09/03/2020 0746   CHOLHDL 4.0 12/17/2017 0639   VLDL 18 12/17/2017 0639   LDLCALC 79 09/03/2020 0746    VS:  BP (!) 142/88 (BP Location: Right Arm, Patient Position: Sitting, Cuff Size: Large)    Pulse 70    Ht 6\' 3"  (1.905 m)    Wt (!) 303 lb (137.4 kg)    SpO2 96%    BMI 37.87 kg/m     Wt Readings from Last 3 Encounters:  12/05/21 (!) 303 lb (137.4 kg)  10/15/21 (!) 313 lb (142 kg)  05/29/21 (!) 313 lb (142 kg)  GEN: Well nourished, well developed in no acute distress HEENT: Normal NECK: No JVD; No carotid  bruits LYMPHATICS: No lymphadenopathy CARDIAC:RRR, no murmurs, rubs, gallops RESPIRATORY:  Clear to auscultation without rales, wheezing or rhonchi  ABDOMEN: Soft, non-tender, non-distended MUSCULOSKELETAL:  No edema; No deformity  SKIN: Warm and dry NEUROLOGIC:  Alert and oriented x 3 PSYCHIATRIC:  Normal affect    ASSESSMENT AND PLAN:    CAD  -s/p single-vessel CABG 2013 -He has not had any chest pain or anginal symptoms since being seen last summer  -He will continue aspirin 81 mg daily, carvedilol 25 mg twice daily and atorvastatin 80 mg daily with as needed refills  2.  Aortic root aneurysm s/p repair -He is followed by CTS surgery -Chest CTA 10/11/2021 showed stable postsurgical anatomy with graft repair of the ascending thoracic aorta with no aneurysm or dissection noted. -Blood pressure is well controlled  3.  Hypertension -BP is adequately controlled in the office today. -Continue prescription drug management carvedilol 25 mg twice daily, Plendil 10mg  daily and lisinopril 40 mg daily with as needed refills -I have personally reviewed and interpreted outside labs performed by patient's PCP which showed serum creatinine 0.87, potassium 4.2.  Hemoglobin A1c 5.7% 09/13/2021  4.  Hyperlipidemia -LDL goal is less than 70  -I have personally reviewed and interpreted outside labs performed by patient's PCP which showed LDL 78, HDL 43, triglycerides 77, ALT 16 on 09/13/2021 -Continue prescription drug management with Atorvastatin 80 mg daily with as needed refills -Add Zetia 10 mg daily to try to get LDL below 70  with repeat FLP and ALT in 6 weeks  5.  Chronic diastolic heart failure -He appears euvolemic on exam today -Continue prescription drug management with Lasix 40 mg daily with.  Refills  Medication Adjustments/Labs and Tests Ordered: Current medicines are reviewed at length with the patient today.  Concerns regarding medicines are outlined above.  No orders of the  defined types were placed in this encounter.   No orders of the defined types were placed in this encounter.    There are no Patient Instructions on file for this visit.   Signed, Jonathan Him, MD  12/05/2021 9:57 AM    [place]

## 2021-12-05 NOTE — Addendum Note (Signed)
Addended by: Molli Barrows on: 12/05/2021 10:20 AM   Modules accepted: Orders

## 2021-12-10 DIAGNOSIS — N401 Enlarged prostate with lower urinary tract symptoms: Secondary | ICD-10-CM | POA: Diagnosis not present

## 2021-12-10 DIAGNOSIS — R339 Retention of urine, unspecified: Secondary | ICD-10-CM | POA: Diagnosis not present

## 2021-12-10 DIAGNOSIS — C61 Malignant neoplasm of prostate: Secondary | ICD-10-CM | POA: Diagnosis not present

## 2021-12-10 DIAGNOSIS — R972 Elevated prostate specific antigen [PSA]: Secondary | ICD-10-CM | POA: Diagnosis not present

## 2021-12-10 DIAGNOSIS — N138 Other obstructive and reflux uropathy: Secondary | ICD-10-CM | POA: Diagnosis not present

## 2021-12-19 DIAGNOSIS — C61 Malignant neoplasm of prostate: Secondary | ICD-10-CM | POA: Diagnosis not present

## 2021-12-19 DIAGNOSIS — E782 Mixed hyperlipidemia: Secondary | ICD-10-CM | POA: Diagnosis not present

## 2021-12-19 DIAGNOSIS — M170 Bilateral primary osteoarthritis of knee: Secondary | ICD-10-CM | POA: Diagnosis not present

## 2021-12-19 DIAGNOSIS — R69 Illness, unspecified: Secondary | ICD-10-CM | POA: Diagnosis not present

## 2021-12-19 DIAGNOSIS — E1169 Type 2 diabetes mellitus with other specified complication: Secondary | ICD-10-CM | POA: Diagnosis not present

## 2021-12-19 DIAGNOSIS — I10 Essential (primary) hypertension: Secondary | ICD-10-CM | POA: Diagnosis not present

## 2021-12-19 DIAGNOSIS — Z8673 Personal history of transient ischemic attack (TIA), and cerebral infarction without residual deficits: Secondary | ICD-10-CM | POA: Diagnosis not present

## 2021-12-25 ENCOUNTER — Ambulatory Visit: Payer: HMO | Admitting: Podiatry

## 2021-12-26 DIAGNOSIS — C61 Malignant neoplasm of prostate: Secondary | ICD-10-CM | POA: Diagnosis not present

## 2022-01-16 ENCOUNTER — Other Ambulatory Visit: Payer: Self-pay

## 2022-01-16 ENCOUNTER — Other Ambulatory Visit: Payer: Medicare HMO

## 2022-01-16 DIAGNOSIS — I10 Essential (primary) hypertension: Secondary | ICD-10-CM

## 2022-01-16 DIAGNOSIS — Z20822 Contact with and (suspected) exposure to covid-19: Secondary | ICD-10-CM | POA: Diagnosis not present

## 2022-01-16 DIAGNOSIS — E7800 Pure hypercholesterolemia, unspecified: Secondary | ICD-10-CM

## 2022-01-16 DIAGNOSIS — I2510 Atherosclerotic heart disease of native coronary artery without angina pectoris: Secondary | ICD-10-CM

## 2022-01-16 DIAGNOSIS — I5032 Chronic diastolic (congestive) heart failure: Secondary | ICD-10-CM

## 2022-01-16 DIAGNOSIS — Z9889 Other specified postprocedural states: Secondary | ICD-10-CM

## 2022-01-16 LAB — LIPID PANEL
Chol/HDL Ratio: 3.3 ratio (ref 0.0–5.0)
Cholesterol, Total: 136 mg/dL (ref 100–199)
HDL: 41 mg/dL (ref >39)
LDL Chol Calc (NIH): 81 mg/dL (ref 0–99)
Triglycerides: 66 mg/dL (ref 0–149)
VLDL Cholesterol Cal: 14 mg/dL (ref 5–40)

## 2022-01-16 LAB — ALT: ALT: 17 IU/L (ref 0–44)

## 2022-01-17 DIAGNOSIS — Z20822 Contact with and (suspected) exposure to covid-19: Secondary | ICD-10-CM | POA: Diagnosis not present

## 2022-01-21 DIAGNOSIS — Z20822 Contact with and (suspected) exposure to covid-19: Secondary | ICD-10-CM | POA: Diagnosis not present

## 2022-01-22 DIAGNOSIS — Z20822 Contact with and (suspected) exposure to covid-19: Secondary | ICD-10-CM | POA: Diagnosis not present

## 2022-01-27 DIAGNOSIS — Z20822 Contact with and (suspected) exposure to covid-19: Secondary | ICD-10-CM | POA: Diagnosis not present

## 2022-01-28 DIAGNOSIS — Z20822 Contact with and (suspected) exposure to covid-19: Secondary | ICD-10-CM | POA: Diagnosis not present

## 2022-02-01 DIAGNOSIS — Z20822 Contact with and (suspected) exposure to covid-19: Secondary | ICD-10-CM | POA: Diagnosis not present

## 2022-02-02 DIAGNOSIS — Z20822 Contact with and (suspected) exposure to covid-19: Secondary | ICD-10-CM | POA: Diagnosis not present

## 2022-02-06 DIAGNOSIS — Z20822 Contact with and (suspected) exposure to covid-19: Secondary | ICD-10-CM | POA: Diagnosis not present

## 2022-02-07 DIAGNOSIS — Z20822 Contact with and (suspected) exposure to covid-19: Secondary | ICD-10-CM | POA: Diagnosis not present

## 2022-02-25 DIAGNOSIS — N400 Enlarged prostate without lower urinary tract symptoms: Secondary | ICD-10-CM | POA: Diagnosis not present

## 2022-02-25 DIAGNOSIS — R351 Nocturia: Secondary | ICD-10-CM | POA: Diagnosis not present

## 2022-02-25 DIAGNOSIS — R339 Retention of urine, unspecified: Secondary | ICD-10-CM | POA: Diagnosis not present

## 2022-02-25 DIAGNOSIS — E669 Obesity, unspecified: Secondary | ICD-10-CM | POA: Diagnosis not present

## 2022-02-25 DIAGNOSIS — R82998 Other abnormal findings in urine: Secondary | ICD-10-CM | POA: Diagnosis not present

## 2022-02-25 DIAGNOSIS — C61 Malignant neoplasm of prostate: Secondary | ICD-10-CM | POA: Diagnosis not present

## 2022-02-25 DIAGNOSIS — R800 Isolated proteinuria: Secondary | ICD-10-CM | POA: Diagnosis not present

## 2022-03-18 DIAGNOSIS — Z23 Encounter for immunization: Secondary | ICD-10-CM | POA: Diagnosis not present

## 2022-03-18 DIAGNOSIS — E782 Mixed hyperlipidemia: Secondary | ICD-10-CM | POA: Diagnosis not present

## 2022-03-18 DIAGNOSIS — I25708 Atherosclerosis of coronary artery bypass graft(s), unspecified, with other forms of angina pectoris: Secondary | ICD-10-CM | POA: Diagnosis not present

## 2022-03-18 DIAGNOSIS — M1710 Unilateral primary osteoarthritis, unspecified knee: Secondary | ICD-10-CM | POA: Diagnosis not present

## 2022-03-18 DIAGNOSIS — C61 Malignant neoplasm of prostate: Secondary | ICD-10-CM | POA: Diagnosis not present

## 2022-03-18 DIAGNOSIS — E1169 Type 2 diabetes mellitus with other specified complication: Secondary | ICD-10-CM | POA: Diagnosis not present

## 2022-03-18 DIAGNOSIS — I10 Essential (primary) hypertension: Secondary | ICD-10-CM | POA: Diagnosis not present

## 2022-03-28 DIAGNOSIS — I110 Hypertensive heart disease with heart failure: Secondary | ICD-10-CM | POA: Diagnosis not present

## 2022-03-28 DIAGNOSIS — I5030 Unspecified diastolic (congestive) heart failure: Secondary | ICD-10-CM | POA: Diagnosis not present

## 2022-03-28 DIAGNOSIS — C61 Malignant neoplasm of prostate: Secondary | ICD-10-CM | POA: Diagnosis not present

## 2022-03-28 DIAGNOSIS — I2510 Atherosclerotic heart disease of native coronary artery without angina pectoris: Secondary | ICD-10-CM | POA: Diagnosis not present

## 2022-03-28 DIAGNOSIS — G4733 Obstructive sleep apnea (adult) (pediatric): Secondary | ICD-10-CM | POA: Diagnosis not present

## 2022-03-28 DIAGNOSIS — Z8673 Personal history of transient ischemic attack (TIA), and cerebral infarction without residual deficits: Secondary | ICD-10-CM | POA: Diagnosis not present

## 2022-03-28 DIAGNOSIS — Z87891 Personal history of nicotine dependence: Secondary | ICD-10-CM | POA: Diagnosis not present

## 2022-03-29 DIAGNOSIS — Z01810 Encounter for preprocedural cardiovascular examination: Secondary | ICD-10-CM | POA: Diagnosis not present

## 2022-04-03 NOTE — Telephone Encounter (Signed)
Location of patient: [facility]    Subjective: Caller states "I am trying not to knee jerk anything. I talked to my Dr. I had blood work done and I am a boring patient. Last year my A1-c was 5.0. My A1-C jumped to 10.5. I eat salads. I have been taking Blood sugar meds for a few weeks. The good news I am not getting lightheaded anymore. My vision is changing and I am not sure if it has to do with Metformin that I am taking. I have not had lightheadedness since I started taking this medication. I think my vision is getting better. "     Current Symptoms: vision changes    Onset: 1 week ago; sudden    Associated Symptoms: NA    Pain Severity: 0/10; N/A; none    Temperature: caller denies by unknown method    What has been tried: glasses    LMP: NA Pregnant: NA    Recommended disposition: See in Office Within 2 Weeks    Care advice provided, patient verbalizes understanding; denies any other questions or concerns; instructed to call back for any new or worsening symptoms.    Patient/caller agrees to follow-up with PCP     This triage is a result of a call to [facility]. Please do not respond to the triage nurse through this encounter. Any subsequent communication should be directly with the patient.        Reason for Disposition   Blurred vision or visual changes and gradual onset (e.g., weeks, months)    Protocols used: Vision Loss or Change-ADULT-[place]

## 2022-04-07 DIAGNOSIS — E119 Type 2 diabetes mellitus without complications: Secondary | ICD-10-CM | POA: Diagnosis not present

## 2022-04-07 DIAGNOSIS — N138 Other obstructive and reflux uropathy: Secondary | ICD-10-CM | POA: Diagnosis not present

## 2022-04-07 DIAGNOSIS — N401 Enlarged prostate with lower urinary tract symptoms: Secondary | ICD-10-CM | POA: Diagnosis not present

## 2022-04-07 DIAGNOSIS — C61 Malignant neoplasm of prostate: Secondary | ICD-10-CM | POA: Diagnosis not present

## 2022-04-07 DIAGNOSIS — R972 Elevated prostate specific antigen [PSA]: Secondary | ICD-10-CM | POA: Diagnosis not present

## 2022-04-07 DIAGNOSIS — Z7984 Long term (current) use of oral hypoglycemic drugs: Secondary | ICD-10-CM | POA: Diagnosis not present

## 2022-04-08 DIAGNOSIS — E119 Type 2 diabetes mellitus without complications: Secondary | ICD-10-CM | POA: Diagnosis not present

## 2022-04-08 DIAGNOSIS — Z7984 Long term (current) use of oral hypoglycemic drugs: Secondary | ICD-10-CM | POA: Diagnosis not present

## 2022-04-08 DIAGNOSIS — C61 Malignant neoplasm of prostate: Secondary | ICD-10-CM | POA: Diagnosis not present

## 2022-05-15 DIAGNOSIS — R69 Illness, unspecified: Secondary | ICD-10-CM | POA: Diagnosis not present

## 2022-05-15 DIAGNOSIS — E782 Mixed hyperlipidemia: Secondary | ICD-10-CM | POA: Diagnosis not present

## 2022-05-15 DIAGNOSIS — R399 Unspecified symptoms and signs involving the genitourinary system: Secondary | ICD-10-CM | POA: Diagnosis not present

## 2022-05-15 DIAGNOSIS — I509 Heart failure, unspecified: Secondary | ICD-10-CM | POA: Diagnosis not present

## 2022-05-15 DIAGNOSIS — Z1159 Encounter for screening for other viral diseases: Secondary | ICD-10-CM | POA: Diagnosis not present

## 2022-05-15 DIAGNOSIS — M170 Bilateral primary osteoarthritis of knee: Secondary | ICD-10-CM | POA: Diagnosis not present

## 2022-05-15 DIAGNOSIS — I25708 Atherosclerosis of coronary artery bypass graft(s), unspecified, with other forms of angina pectoris: Secondary | ICD-10-CM | POA: Diagnosis not present

## 2022-05-15 DIAGNOSIS — Z8673 Personal history of transient ischemic attack (TIA), and cerebral infarction without residual deficits: Secondary | ICD-10-CM | POA: Diagnosis not present

## 2022-05-15 DIAGNOSIS — I10 Essential (primary) hypertension: Secondary | ICD-10-CM | POA: Diagnosis not present

## 2022-05-15 DIAGNOSIS — Z0000 Encounter for general adult medical examination without abnormal findings: Secondary | ICD-10-CM | POA: Diagnosis not present

## 2022-05-15 DIAGNOSIS — E1169 Type 2 diabetes mellitus with other specified complication: Secondary | ICD-10-CM | POA: Diagnosis not present

## 2022-05-15 DIAGNOSIS — Z8546 Personal history of malignant neoplasm of prostate: Secondary | ICD-10-CM | POA: Diagnosis not present

## 2022-06-05 DIAGNOSIS — Z483 Aftercare following surgery for neoplasm: Secondary | ICD-10-CM | POA: Diagnosis not present

## 2022-06-05 DIAGNOSIS — N401 Enlarged prostate with lower urinary tract symptoms: Secondary | ICD-10-CM | POA: Diagnosis not present

## 2022-06-05 DIAGNOSIS — N138 Other obstructive and reflux uropathy: Secondary | ICD-10-CM | POA: Diagnosis not present

## 2022-06-05 DIAGNOSIS — N5231 Erectile dysfunction following radical prostatectomy: Secondary | ICD-10-CM | POA: Diagnosis not present

## 2022-06-05 DIAGNOSIS — C61 Malignant neoplasm of prostate: Secondary | ICD-10-CM | POA: Diagnosis not present

## 2022-06-05 DIAGNOSIS — N393 Stress incontinence (female) (male): Secondary | ICD-10-CM | POA: Diagnosis not present

## 2022-07-22 DIAGNOSIS — H43813 Vitreous degeneration, bilateral: Secondary | ICD-10-CM | POA: Diagnosis not present

## 2022-07-22 DIAGNOSIS — H40053 Ocular hypertension, bilateral: Secondary | ICD-10-CM | POA: Diagnosis not present

## 2022-07-22 DIAGNOSIS — E119 Type 2 diabetes mellitus without complications: Secondary | ICD-10-CM | POA: Diagnosis not present

## 2022-07-22 DIAGNOSIS — H2513 Age-related nuclear cataract, bilateral: Secondary | ICD-10-CM | POA: Diagnosis not present

## 2022-08-15 DIAGNOSIS — R69 Illness, unspecified: Secondary | ICD-10-CM | POA: Diagnosis not present

## 2022-08-15 DIAGNOSIS — E782 Mixed hyperlipidemia: Secondary | ICD-10-CM | POA: Diagnosis not present

## 2022-08-15 DIAGNOSIS — M1710 Unilateral primary osteoarthritis, unspecified knee: Secondary | ICD-10-CM | POA: Diagnosis not present

## 2022-08-15 DIAGNOSIS — I25708 Atherosclerosis of coronary artery bypass graft(s), unspecified, with other forms of angina pectoris: Secondary | ICD-10-CM | POA: Diagnosis not present

## 2022-08-15 DIAGNOSIS — I509 Heart failure, unspecified: Secondary | ICD-10-CM | POA: Diagnosis not present

## 2022-08-15 DIAGNOSIS — E1169 Type 2 diabetes mellitus with other specified complication: Secondary | ICD-10-CM | POA: Diagnosis not present

## 2022-08-15 DIAGNOSIS — C61 Malignant neoplasm of prostate: Secondary | ICD-10-CM | POA: Diagnosis not present

## 2022-08-15 DIAGNOSIS — Z9889 Other specified postprocedural states: Secondary | ICD-10-CM | POA: Diagnosis not present

## 2022-08-15 DIAGNOSIS — I10 Essential (primary) hypertension: Secondary | ICD-10-CM | POA: Diagnosis not present

## 2022-09-01 ENCOUNTER — Other Ambulatory Visit: Payer: Self-pay | Admitting: Thoracic Surgery (Cardiothoracic Vascular Surgery)

## 2022-09-01 DIAGNOSIS — I77810 Thoracic aortic ectasia: Secondary | ICD-10-CM

## 2022-10-01 DIAGNOSIS — G4733 Obstructive sleep apnea (adult) (pediatric): Secondary | ICD-10-CM | POA: Diagnosis not present

## 2022-10-01 DIAGNOSIS — E119 Type 2 diabetes mellitus without complications: Secondary | ICD-10-CM | POA: Diagnosis not present

## 2022-10-01 DIAGNOSIS — N393 Stress incontinence (female) (male): Secondary | ICD-10-CM | POA: Diagnosis not present

## 2022-10-01 DIAGNOSIS — I2510 Atherosclerotic heart disease of native coronary artery without angina pectoris: Secondary | ICD-10-CM | POA: Diagnosis not present

## 2022-10-01 DIAGNOSIS — Z79891 Long term (current) use of opiate analgesic: Secondary | ICD-10-CM | POA: Diagnosis not present

## 2022-10-01 DIAGNOSIS — I509 Heart failure, unspecified: Secondary | ICD-10-CM | POA: Diagnosis not present

## 2022-10-01 DIAGNOSIS — R69 Illness, unspecified: Secondary | ICD-10-CM | POA: Diagnosis not present

## 2022-10-01 DIAGNOSIS — I110 Hypertensive heart disease with heart failure: Secondary | ICD-10-CM | POA: Diagnosis not present

## 2022-10-13 ENCOUNTER — Telehealth: Payer: Self-pay

## 2022-10-13 MED ORDER — PREDNISONE 50 MG PO TABS: ORAL_TABLET | ORAL | 0 refills | Status: DC

## 2022-10-13 NOTE — Telephone Encounter (Signed)
Phone call to patient to review instructions for 13 hr prep for CT w/ contrast on 10/22/22 at 9:20 AM. Prescription called into [facility]. Pt aware and verbalized understanding of instructions. Prescription: Pt to take 50 mg of prednisone on 10/21/22 at 8:20 PM, 50 mg of prednisone on 10/22/22 at 2:20 AM, and 50 mg of prednisone on 10/22/22 at 8:20 AM. Pt is also to take 50 mg of benadryl on 10/22/22 at 8:20 AM. Please call [phone] with any questions.   Benadryl was not called in as a separate prescription as the patient reports he has benadryl at home and verbalized understanding of when to take benadryl. Pt also advised to have a driver the day of taking this medication as it may cause drowsiness.

## 2022-10-22 ENCOUNTER — Ambulatory Visit
Admission: RE | Admit: 2022-10-22 | Discharge: 2022-10-22 | Disposition: A | Discharge status: Home / Self Care | Payer: Medicare HMO | Source: Ambulatory Visit | Attending: Thoracic Surgery (Cardiothoracic Vascular Surgery) | Admitting: Thoracic Surgery (Cardiothoracic Vascular Surgery)

## 2022-10-22 DIAGNOSIS — I77810 Thoracic aortic ectasia: Secondary | ICD-10-CM

## 2022-10-22 DIAGNOSIS — J8410 Pulmonary fibrosis, unspecified: Secondary | ICD-10-CM | POA: Diagnosis not present

## 2022-10-22 DIAGNOSIS — I2510 Atherosclerotic heart disease of native coronary artery without angina pectoris: Secondary | ICD-10-CM | POA: Diagnosis not present

## 2022-10-22 DIAGNOSIS — K753 Granulomatous hepatitis, not elsewhere classified: Secondary | ICD-10-CM | POA: Diagnosis not present

## 2022-10-22 DIAGNOSIS — I7100 Dissection of unspecified site of aorta: Secondary | ICD-10-CM | POA: Diagnosis not present

## 2022-10-22 MED ORDER — IOPAMIDOL (ISOVUE-370) INJECTION 76%
75.0000 mL | Freq: Once | INTRAVENOUS | Status: AC | PRN
  Administered 2022-10-22: 75 mL via INTRAVENOUS

## 2022-10-28 ENCOUNTER — Ambulatory Visit: Payer: Medicare HMO | Admitting: Thoracic Surgery (Cardiothoracic Vascular Surgery)

## 2022-10-28 ENCOUNTER — Ambulatory Visit (INDEPENDENT_AMBULATORY_CARE_PROVIDER_SITE_OTHER): Payer: Medicare HMO | Admitting: Thoracic Surgery (Cardiothoracic Vascular Surgery)

## 2022-10-28 VITALS — BP 122/76 | HR 67 | Resp 20 | Ht 75.0 in

## 2022-10-28 DIAGNOSIS — I77810 Thoracic aortic ectasia: Secondary | ICD-10-CM

## 2022-10-28 NOTE — Progress Notes (Signed)
[address]       [place] [address]             [phone]     HPI: Jonathan Miles returns for a scheduled follow-up visit regarding his ascending aorta  Jonathan Miles is a 62 year old man with a history of aortic root aneurysm, valve sparing aortic root replacement, CAD, CABG x 1, stroke, hypertension, hyperlipidemia, type 2 diabetes, obesity, obstructive sleep apnea, prostate cancer, anxiety and depression, and osteoarthritis.  He had a valve sparing root replacement and CABG x 1 by Dr. Servando Snare in 2013.  Has been followed with echocardiograms and CT angiograms since then.  I started following him after Dr. Servando Snare retired.  Over the past year he has lost a significant amount of weight.  Overall feels well.  No chest pain, pressure, tightness.  Past Medical History:  Diagnosis Date   Alcohol abuse    Allergy    Anxiety    Aortic root dilatation ([place])    a. echo 4/12: EF 55-60%, mod to marked dilated ascending aortic root;   b. MRA 4/12: Aortic root 5.5 cm, dilation of left and right coronary cusps, trileaflet aortic valve // Echo 2/19: Moderate LVH, EF 55-60, normal wall motion, grade 1 diastolic dysfunction, mild AI, mildly dilated aortic root, moderate to severe LAE (aortic root 40 mm; ascending aorta 39 mm)    Arthritis    Asthma    ast attack in early 20's   CAD (coronary artery disease)    Cancer (HCC)    prostate cancer   Chest pain    Myoview 4/12: EF 62%, no ischemia or scar   CHF (congestive heart failure) (HCC)    Chronic pain    back and hands   Constipation    COPD (chronic obstructive pulmonary disease) (HCC)    Depression    Diabetes mellitus    type 2   Difficulty speaking    Drug use    Enlarged prostate    Fatigue    GERD (gastroesophageal reflux disease)    modifies with diet   Hx of echocardiogram    Echo (2/16):  Mod LVH, EF 55-60%, mild to mod AI, mod LAE, mild RAE, Aortic root 38 mm, Asc aorta 43 mm   Hyperlipidemia     Hypertension    Joint pain    Knee pain, bilateral    Leg pain    ABIs 3/19: Normal (R 1.3; L 1.28)   Neuromuscular disorder (HCC)    left foot ? neuropathy    OSA (obstructive sleep apnea) 2006   .  Wears at times   Osteoarthritis    Palpitations    Sleep apnea    wears cpap  but having sleep study 08-16-2020; is not wearing now   Stroke [facility]) 2018    Current Outpatient Medications  Medication Sig Dispense Refill   acetaminophen (TYLENOL) 500 MG tablet TAKE 1-2 TABS EVERY 6 HOURS AS NEEDED FOR PAIN.  Do not take more than 4000 mg of Tylenol per day it can harm your liver. 30 tablet 0   Armodafinil 150 MG tablet TAKE ONE TABLET BY MOUTH DAILY 30 tablet 5   aspirin 325 MG EC tablet Take 1 tablet (325 mg total) by mouth at bedtime. 90 tablet 3   atorvastatin (LIPITOR) 80 MG tablet Take 1 tablet (80 mg total) by mouth daily at 6 PM. 90 tablet 3   carvedilol (COREG) 25 MG tablet Take 1 tablet (  25 mg total) by mouth 2 (two) times daily with a meal. 180 tablet 3   cyclobenzaprine (FLEXERIL) 10 MG tablet Take 1 tablet by mouth 3 (three) times daily.     felodipine (PLENDIL) 10 MG 24 hr tablet TAKE ONE TABLET BY MOUTH DAILY 90 tablet 2   furosemide (LASIX) 40 MG tablet Take 1 tablet (40 mg total) by mouth daily. 90 tablet 3   lidocaine (XYLOCAINE) 5 % ointment Apply 1 application topically as needed. Daily for leg pain 50 g 3   lisinopril (ZESTRIL) 40 MG tablet Take 40 mg by mouth daily.     loratadine (CLARITIN) 10 MG tablet Take 10 mg by mouth at bedtime.      Menthol-Methyl Salicylate (THERA-GESIC) 0.5-15 % CREA Apply 1 application topically daily as needed (pain).     metFORMIN (GLUCOPHAGE) 1000 MG tablet Take 1 tablet (1,000 mg total) by mouth 2 (two) times daily with a meal. 180 tablet 3   predniSONE (DELTASONE) 50 MG tablet Pt to take 50 mg of prednisone on 10/21/22 at 8:20 PM, 50 mg of prednisone on 10/22/22 at 2:20 AM, and 50 mg of prednisone on 10/22/22 at 8:20 AM. Pt is also to take  50 mg of benadryl on 10/22/22 at 8:20 AM. Please call [phone] with any questions. 3 tablet 0   traMADol (ULTRAM) 50 MG tablet Take by mouth every 6 (six) hours as needed.     Trolamine Salicylate (ASPERCREME EX) Apply 1 application topically daily as needed (pain).     TURMERIC PO Take by mouth.     ezetimibe (ZETIA) 10 MG tablet Take 1 tablet (10 mg total) by mouth daily. (Patient not taking: Reported on 10/28/2022) 90 tablet 3   No current facility-administered medications for this visit.    Physical Exam BP 122/76   Pulse 67   Resp 20   Ht '6\' 3"'$  (1.905 m)   SpO2 95% Comment: RA  BMI 37.7 kg/m  62 year old man in no acute distress Alert and oriented x 3 with no focal deficits Lungs clear with equal breath sounds bilaterally Cardiac regular rate and rhythm, no murmur or gallop No carotid bruits  Diagnostic Tests: CT ANGIOGRAPHY CHEST WITH CONTRAST   TECHNIQUE: Multidetector CT imaging of the chest was performed using the standard protocol during bolus administration of intravenous contrast. Multiplanar CT image reconstructions and MIPs were obtained to evaluate the vascular anatomy.   RADIATION DOSE REDUCTION: This exam was performed according to the departmental dose-optimization program which includes automated exposure control, adjustment of the mA and/or kV according to patient size and/or use of iterative reconstruction technique.   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 Patient is also status post median sternotomy with evidence of multivessel CABG. Calcifications present throughout the native coronary arteries. No evidence of aortic dissection.   Mediastinum/Nodes: Unremarkable CT appearance of the thyroid gland. No suspicious mediastinal or hilar adenopathy. No soft  tissue mediastinal mass. The thoracic esophagus is unremarkable.   Lungs/Pleura: Benign subpleural calcification in the inferior left lower lobe measures up to 7 mm. The lungs are otherwise clear. No pleural effusion or pneumothorax.   Upper Abdomen: Scattered calcifications throughout the spleen and liver consistent with old granulomatous disease. Tiny 5 mm low-attenuation lesion in the right hepatic lobe is too small to characterize but statistically likely a benign cyst. The  gallbladder is surgically absent. No acute abnormality within the upper abdomen.   Musculoskeletal: No chest wall abnormality. No acute or significant osseous findings.   Review of the MIP images confirms the above findings.   IMPRESSION: 1. Surgical changes of open repair of ascending thoracic aorta without evidence of complication. 2. Surgical changes of multivessel CABG. 3. Evidence of old granulomatous disease involving the spleen, liver and left lower lobe of the lung. 4. No acute cardiopulmonary process.     Electronically Signed   By: Jacqulynn Cadet M.D.   On: 10/22/2022 10:50 I personally reviewed the CT images.  No change in mildly aneurysmal distal ascending aorta.  Measured around 4 to 4.1 cm on today's scan.  Impression: Jonathan Miles is a 62 year old man with a history of aortic root aneurysm, valve sparing aortic root replacement, CAD, CABG x 1, stroke, hypertension, hyperlipidemia, type 2 diabetes, obesity, obstructive sleep apnea, prostate cancer, anxiety and depression, and osteoarthritis.  Aortic root aneurysm-s/p valve sparing aortic root replacement 10 years ago.  Distal ascending/ proximal arch aneurysm-measured at 4.0 to 4.1 cm near the takeoff of the innominate artery.  Unchanged from previous scans.  Hypertension-blood pressure well-controlled on current regimen  Hyperlipidemia-on Lipitor.  Type 2 diabetes-managed by primary  Granulomatous disease-he had questions about  findings noted on CT of granulomas in left lower lobe and spleen.  Explained to him that these are benign lesions that have been present dating back many years.  No cause for concern.  Plan: Return in 1 year with CT angio of chest  Melrose Nakayama, MD [facility] [phone]

## 2022-12-24 ENCOUNTER — Other Ambulatory Visit: Payer: Self-pay | Admitting: Cardiology

## 2022-12-24 DIAGNOSIS — Z9889 Other specified postprocedural states: Secondary | ICD-10-CM

## 2022-12-24 DIAGNOSIS — I2510 Atherosclerotic heart disease of native coronary artery without angina pectoris: Secondary | ICD-10-CM

## 2022-12-24 DIAGNOSIS — I5032 Chronic diastolic (congestive) heart failure: Secondary | ICD-10-CM

## 2022-12-24 DIAGNOSIS — E7800 Pure hypercholesterolemia, unspecified: Secondary | ICD-10-CM

## 2022-12-24 DIAGNOSIS — I10 Essential (primary) hypertension: Secondary | ICD-10-CM

## 2022-12-28 ENCOUNTER — Other Ambulatory Visit: Payer: Self-pay | Admitting: Cardiology

## 2022-12-28 DIAGNOSIS — E7800 Pure hypercholesterolemia, unspecified: Secondary | ICD-10-CM

## 2022-12-28 DIAGNOSIS — I2510 Atherosclerotic heart disease of native coronary artery without angina pectoris: Secondary | ICD-10-CM

## 2022-12-28 DIAGNOSIS — I10 Essential (primary) hypertension: Secondary | ICD-10-CM

## 2022-12-28 DIAGNOSIS — I5032 Chronic diastolic (congestive) heart failure: Secondary | ICD-10-CM

## 2022-12-28 DIAGNOSIS — Z9889 Other specified postprocedural states: Secondary | ICD-10-CM

## 2023-02-02 DIAGNOSIS — E782 Mixed hyperlipidemia: Secondary | ICD-10-CM | POA: Diagnosis not present

## 2023-02-02 DIAGNOSIS — C61 Malignant neoplasm of prostate: Secondary | ICD-10-CM | POA: Diagnosis not present

## 2023-02-02 DIAGNOSIS — E1169 Type 2 diabetes mellitus with other specified complication: Secondary | ICD-10-CM | POA: Diagnosis not present

## 2023-02-02 DIAGNOSIS — I10 Essential (primary) hypertension: Secondary | ICD-10-CM | POA: Diagnosis not present

## 2023-02-03 ENCOUNTER — Other Ambulatory Visit: Payer: Self-pay | Admitting: Cardiology

## 2023-02-03 DIAGNOSIS — I10 Essential (primary) hypertension: Secondary | ICD-10-CM

## 2023-02-03 DIAGNOSIS — I5032 Chronic diastolic (congestive) heart failure: Secondary | ICD-10-CM

## 2023-02-03 DIAGNOSIS — I2510 Atherosclerotic heart disease of native coronary artery without angina pectoris: Secondary | ICD-10-CM

## 2023-02-03 DIAGNOSIS — E7800 Pure hypercholesterolemia, unspecified: Secondary | ICD-10-CM

## 2023-02-03 DIAGNOSIS — Z9889 Other specified postprocedural states: Secondary | ICD-10-CM

## 2023-02-09 ENCOUNTER — Encounter (HOSPITAL_BASED_OUTPATIENT_CLINIC_OR_DEPARTMENT_OTHER): Payer: Self-pay | Admitting: Cardiology

## 2023-02-09 ENCOUNTER — Telehealth: Payer: Self-pay

## 2023-02-09 DIAGNOSIS — I5032 Chronic diastolic (congestive) heart failure: Secondary | ICD-10-CM

## 2023-02-09 DIAGNOSIS — E7800 Pure hypercholesterolemia, unspecified: Secondary | ICD-10-CM

## 2023-02-09 DIAGNOSIS — I2510 Atherosclerotic heart disease of native coronary artery without angina pectoris: Secondary | ICD-10-CM

## 2023-02-09 DIAGNOSIS — Z9889 Other specified postprocedural states: Secondary | ICD-10-CM

## 2023-02-09 DIAGNOSIS — I10 Essential (primary) hypertension: Secondary | ICD-10-CM

## 2023-02-09 NOTE — Telephone Encounter (Signed)
Patient requesting refills via Mychart, but notes on scripts state he needs yearly appt. Called patient, no answer, left detailed message per DPR asking patient to make yearly appt. Offered 03/03/13 as Dr. Mayford Knife had a cancellation that day. Also sent Mychart message explaining appointment needs to be made for refills.  Patient also requesting chest ct results from December 2023 be forwarded to Dr. Mayford Knife, which was done.

## 2023-02-16 ENCOUNTER — Other Ambulatory Visit: Payer: Self-pay | Admitting: Cardiology

## 2023-02-16 DIAGNOSIS — I2510 Atherosclerotic heart disease of native coronary artery without angina pectoris: Secondary | ICD-10-CM

## 2023-02-16 DIAGNOSIS — Z9889 Other specified postprocedural states: Secondary | ICD-10-CM

## 2023-02-16 DIAGNOSIS — I5032 Chronic diastolic (congestive) heart failure: Secondary | ICD-10-CM

## 2023-02-16 DIAGNOSIS — I10 Essential (primary) hypertension: Secondary | ICD-10-CM

## 2023-02-16 DIAGNOSIS — E7800 Pure hypercholesterolemia, unspecified: Secondary | ICD-10-CM

## 2023-02-19 ENCOUNTER — Other Ambulatory Visit: Payer: Self-pay | Admitting: Cardiology

## 2023-02-19 DIAGNOSIS — I10 Essential (primary) hypertension: Secondary | ICD-10-CM

## 2023-02-19 DIAGNOSIS — I2510 Atherosclerotic heart disease of native coronary artery without angina pectoris: Secondary | ICD-10-CM

## 2023-02-19 DIAGNOSIS — Z9889 Other specified postprocedural states: Secondary | ICD-10-CM

## 2023-02-19 DIAGNOSIS — E7800 Pure hypercholesterolemia, unspecified: Secondary | ICD-10-CM

## 2023-02-19 DIAGNOSIS — I5032 Chronic diastolic (congestive) heart failure: Secondary | ICD-10-CM

## 2023-03-04 ENCOUNTER — Encounter: Payer: Self-pay | Admitting: Cardiology

## 2023-03-04 ENCOUNTER — Ambulatory Visit: Payer: Medicare HMO | Attending: Cardiology | Admitting: Cardiology

## 2023-03-04 VITALS — BP 143/86 | HR 69 | Ht 75.0 in | Wt 298.6 lb

## 2023-03-04 DIAGNOSIS — I10 Essential (primary) hypertension: Secondary | ICD-10-CM

## 2023-03-04 DIAGNOSIS — I2510 Atherosclerotic heart disease of native coronary artery without angina pectoris: Secondary | ICD-10-CM | POA: Diagnosis not present

## 2023-03-04 DIAGNOSIS — I5032 Chronic diastolic (congestive) heart failure: Secondary | ICD-10-CM

## 2023-03-04 DIAGNOSIS — Z79899 Other long term (current) drug therapy: Secondary | ICD-10-CM

## 2023-03-04 DIAGNOSIS — E7800 Pure hypercholesterolemia, unspecified: Secondary | ICD-10-CM

## 2023-03-04 DIAGNOSIS — Z9889 Other specified postprocedural states: Secondary | ICD-10-CM | POA: Diagnosis not present

## 2023-03-04 MED ORDER — CARVEDILOL 25 MG PO TABS: 25.0000 mg | ORAL_TABLET | Freq: Two times a day (BID) | ORAL | 3 refills | Status: DC

## 2023-03-04 MED ORDER — LISINOPRIL 40 MG PO TABS: 40.0000 mg | ORAL_TABLET | Freq: Every day | ORAL | 3 refills | Status: DC

## 2023-03-04 MED ORDER — ATORVASTATIN CALCIUM 80 MG PO TABS: ORAL_TABLET | ORAL | 3 refills | Status: DC

## 2023-03-04 MED ORDER — FUROSEMIDE 40 MG PO TABS: 40.0000 mg | ORAL_TABLET | Freq: Every day | ORAL | 3 refills | Status: DC

## 2023-03-04 MED ORDER — FELODIPINE ER 10 MG PO TB24: 10.0000 mg | ORAL_TABLET | Freq: Every day | ORAL | 3 refills | Status: DC

## 2023-03-04 NOTE — Progress Notes (Signed)
Cardiology Office Note:    Date:  03/04/2023   ID:  [name], DOB 1961-01-08, MRN [id number]  PCP:  Aliene Beams, MD  [facility] Cardiologist:  Armanda Magic, MD  [facility] Electrophysiologist:  None   Chief Complaint: yearly follow up   History of Present Illness:    Jonathan Miles is a 62 y.o. 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 Craige Cotta) presents for follow-up.  He is here today for followup and is doing well.  He has chronic DOE that is very stable.  He denies any chest pain or pressure, PND, orthopnea, palpitations or syncope. He had 1 dizzy spell after getting up too fast. He occasionally has some mild LE edema at times if he sits too long.  He is compliant with his meds and is tolerating meds with no SE.   Past Medical History:  Diagnosis Date   Alcohol abuse    Allergy    Anxiety    Aortic root dilatation (HCC)    a. echo 4/12: EF 55-60%, mod to marked dilated ascending aortic root;   b. MRA 4/12: Aortic root 5.5 cm, dilation of left and right coronary cusps, trileaflet aortic valve // Echo 2/19: Moderate LVH, EF 55-60, normal wall motion, grade 1 diastolic dysfunction, mild AI, mildly dilated aortic root, moderate to severe LAE (aortic root 40 mm; ascending aorta 39 mm)    Arthritis    Asthma    ast attack in early 20's   CAD (coronary artery disease)    Cancer (HCC)    prostate cancer   CHF (congestive heart failure) (HCC)    Chronic pain    back and hands   Constipation    COPD (chronic obstructive pulmonary disease) (HCC)    Depression    Diabetes mellitus    type 2   Difficulty speaking    Drug use    Enlarged prostate    Fatigue    GERD (gastroesophageal reflux disease)    modifies with diet   Hx of echocardiogram     Echo (2/16):  Mod LVH, EF 55-60%, mild to mod AI, mod LAE, mild RAE, Aortic root 38 mm, Asc aorta 43 mm   Hyperlipidemia    Hypertension    Joint pain    Knee pain, bilateral    Leg pain    ABIs 3/19: Normal (R 1.3; L 1.28)   Neuromuscular disorder (HCC)    left foot ? neuropathy    OSA (obstructive sleep apnea) 2006   .  Wears at times   Osteoarthritis    Palpitations    Stroke [facility]) 2018    Past Surgical History:  Procedure Laterality Date   APPENDECTOMY  12/2016   CHOLECYSTECTOMY N/A 11/24/2018   Procedure: LAPAROSCOPIC CHOLECYSTECTOMY;  Surgeon: Almond Lint, MD;  Location: [facility] ORS;  Service: General;  Laterality: N/A;   COLONOSCOPY  2011   Colonscopy     CORONARY ARTERY BYPASS GRAFT  05/07/2012   Procedure: CORONARY ARTERY BYPASS GRAFTING (CABG);  Surgeon: Delight Ovens, MD;  Location: [facility] OR;  Service: Open Heart Surgery;  Laterality: N/A;   KNEE ARTHROSCOPY Left 2010   Dr. Cathlean Cower   LAPAROSCOPIC APPENDECTOMY N/A 01/06/2017   Procedure: APPENDECTOMY LAPAROSCOPIC;  Surgeon: Axel Filler, MD;  Location: [facility] OR;  Service: General;  Laterality:  N/A;   Retactment of fingers     as a child , sewed with sewing machine- Index and middle finger   UPPER GASTROINTESTINAL ENDOSCOPY      Current Medications: Current Meds  Medication Sig   acetaminophen (TYLENOL) 500 MG tablet TAKE 1-2 TABS EVERY 6 HOURS AS NEEDED FOR PAIN.  Do not take more than 4000 mg of Tylenol per day it can harm your liver.   Armodafinil 150 MG tablet TAKE ONE TABLET BY MOUTH DAILY   aspirin 325 MG EC tablet Take 1 tablet (325 mg total) by mouth at bedtime.   atorvastatin (LIPITOR) 80 MG tablet TAKE ONE TABLET BY MOUTH DAILY AT 6PM   carvedilol (COREG) 25 MG tablet Take 1 tablet (25 mg total) by mouth 2 (two) times daily with a meal.   cyclobenzaprine (FLEXERIL) 10 MG tablet Take 1 tablet by mouth 3 (three) times daily.   felodipine (PLENDIL) 10 MG 24 hr tablet TAKE ONE TABLET BY MOUTH DAILY   furosemide  (LASIX) 40 MG tablet TAKE ONE TABLET BY MOUTH DAILY   lidocaine (XYLOCAINE) 5 % ointment Apply 1 application topically as needed. Daily for leg pain   lisinopril (ZESTRIL) 40 MG tablet Take 40 mg by mouth daily.   loratadine (CLARITIN) 10 MG tablet Take 10 mg by mouth at bedtime.    Menthol-Methyl Salicylate (THERA-GESIC) 0.5-15 % CREA Apply 1 application topically daily as needed (pain).   metFORMIN (GLUCOPHAGE) 1000 MG tablet Take 1 tablet (1,000 mg total) by mouth 2 (two) times daily with a meal.   predniSONE (DELTASONE) 50 MG tablet Pt to take 50 mg of prednisone on 10/21/22 at 8:20 PM, 50 mg of prednisone on 10/22/22 at 2:20 AM, and 50 mg of prednisone on 10/22/22 at 8:20 AM. Pt is also to take 50 mg of benadryl on 10/22/22 at 8:20 AM. Please call [phone] with any questions.   traMADol (ULTRAM) 50 MG tablet Take by mouth every 6 (six) hours as needed.   Trolamine Salicylate (ASPERCREME EX) Apply 1 application topically daily as needed (pain).   TURMERIC PO Take by mouth.     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  Tobacco Use   Smoking status: Former    Packs/day: 1.00    Years: 13.00    Additional pack years: 0.00    Total pack years: 13.00    Types: Cigarettes    Quit date: 01/23/1988    Years since quitting: 35.1   Smokeless tobacco: Never  Vaping Use   Vaping Use: Never used  Substance and Sexual Activity   Alcohol use: Not Currently    Comment: quit 1989   Drug use: No    Comment: previous cocaine and marijuana use, quit 1988   Sexual activity: Not Currently    Partners: Female  Other Topics Concern   Not on file   Social History Narrative   Married, lives in [place]. 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    Pt is adopted. Unsure of his family medical history.   Social Determinants of Health   Financial Resource Strain: Not on file  Food Insecurity: Not  on file  Transportation Needs: Not on file  Physical Activity: Not on file  Stress: Not on file  Social Connections: Not on file     Family History: The patient's family history is not on file. He was adopted.    ROS:   Please see the history of present illness.    All other systems reviewed and are negative.   EKGs/Labs/Other Studies Reviewed:    The following studies were reviewed today:  Echo 11/2017 Study Conclusions   - Left ventricle: The cavity size was normal. Wall thickness was    increased in a pattern of moderate LVH. Systolic function was    normal. The estimated ejection fraction was in the range of 55%    to 60%. Wall motion was normal; there were no regional wall    motion abnormalities. Doppler parameters are consistent with    abnormal left ventricular relaxation (grade 1 diastolic    dysfunction).  - Aortic valve: There was mild regurgitation. Valve area (VTI):    3.48 cm^2. Valve area (Vmax): 3.49 cm^2. Valve area (Vmean): 3.31    cm^2.  - Aortic root: The aortic root was mildly dilated.  - Left atrium: The atrium was moderately to severely dilated.   EKG:  EKG is ordered today.  The ekg ordered today demonstrates NSR at 69bpm with no ST changes  Recent Labs: No results found for requested labs within last 365 days.  Recent Lipid Panel    Component Value Date/Time   CHOL 136 01/16/2022 0742   TRIG 66 01/16/2022 0742   HDL 41 01/16/2022 0742   CHOLHDL 3.3 01/16/2022 0742   CHOLHDL 4.0 12/17/2017 0639   VLDL 18 12/17/2017 0639   LDLCALC 81 01/16/2022 0742    VS:  BP (!) 143/86   Pulse 69   Ht 6\' 3"  (1.905 m)   Wt 298 lb 9.6 oz (135.4 kg)   SpO2 94%   BMI 37.32 kg/m     Wt Readings from Last  3 Encounters:  03/04/23 298 lb 9.6 oz (135.4 kg)  12/05/21 (!) 303 lb (137.4 kg)  10/15/21 (!) 313 lb (142 kg)  GEN: Well nourished, well developed in no acute distress HEENT: Normal NECK: No JVD; No carotid bruits LYMPHATICS: No lymphadenopathy CARDIAC:RRR, no murmurs, rubs, gallops RESPIRATORY:  Clear to auscultation without rales, wheezing or rhonchi  ABDOMEN: Soft, non-tender, non-distended MUSCULOSKELETAL:  No edema; No deformity  SKIN: Warm and dry NEUROLOGIC:  Alert and oriented x 3 PSYCHIATRIC:  Normal affect    ASSESSMENT AND PLAN:    CAD  -s/p single-vessel CABG 2013 -He denies any anginal symptoms -Continue prescription management with aspirin decreased to 81 mg daily, beta-blocker and statin therapy  2.  Aortic root aneurysm s/p repair -He is followed by CTS surgery -Chest CTA 10/11/2021 showed stable postsurgical anatomy with graft repair of the ascending thoracic aorta with no aneurysm or dissection noted. -Blood pressure is well controlled  3.  Hypertension -BP well-controlled on exam today Continue with drug management with felodipine 10 mg daily, carvedilol 25 mg twice daily and lisinopril 40 mg daily with as needed refills  4.  Hyperlipidemia -LDL goal is less than 70  -I have personally reviewed and interpreted outside labs performed by patient's PCP which showed LDL 76 and HDL 40 on 02/02/2023 -Continue prescription drug management with atorvastatin 80 mg daily with as needed refills -he stopped taking Zetia because he did not want to take so many pills and will work on  diet  5.  Chronic diastolic heart failure -He does not appear volume overloaded on exam today -Continue prescription drug management with Lasix daily with as needed refills -I have personally reviewed and interpreted outside labs performed by patient's PCP which showed serum creatinine 0.92 potassium 4.5 on 02/02/2023  Medication Adjustments/Labs and Tests Ordered: Current medicines are  reviewed at length with the patient today.  Concerns regarding medicines are outlined above.  Orders Placed This Encounter  Procedures   EKG 12-Lead    No orders of the defined types were placed in this encounter.    There are no Patient Instructions on file for this visit.   Signed, Armanda Magic, MD  03/04/2023 2:36 PM    [facility]

## 2023-03-04 NOTE — Addendum Note (Signed)
Addended by: Luellen Pucker on: 03/04/2023 02:50 PM   Modules accepted: Orders

## 2023-03-04 NOTE — Patient Instructions (Signed)
Medication Instructions:  Please note several refills were sent to your pharmacy of record.   *If you need a refill on your cardiac medications before your next appointment, please call your pharmacy*   Lab Work: Please schedule to have a FASTING lipid panel and ALT drawn in our lab in 6 months.   If you have labs (blood work) drawn today and your tests are completely normal, you will receive your results only by: MyChart Message (if you have MyChart) OR A paper copy in the mail If you have any lab test that is abnormal or we need to change your treatment, we will call you to review the results.   Testing/Procedures: None.   Follow-Up: At [facility], you and your health needs are our priority.  As part of our continuing mission to provide you with exceptional heart care, we have created designated Provider Care Teams.  These Care Teams include your primary Cardiologist (physician) and Advanced Practice Providers (APPs -  Physician Assistants and Nurse Practitioners) who all work together to provide you with the care you need, when you need it.  We recommend signing up for the patient portal called "MyChart".  Sign up information is provided on this After Visit Summary.  MyChart is used to connect with patients for Virtual Visits (Telemedicine).  Patients are able to view lab/test results, encounter notes, upcoming appointments, etc.  Non-urgent messages can be sent to your provider as well.   To learn more about what you can do with MyChart, go to [email or web address].    Your next appointment:   1 year(s)  Provider:   Armanda Magic, MD

## 2023-05-29 DIAGNOSIS — Z79899 Other long term (current) drug therapy: Secondary | ICD-10-CM | POA: Diagnosis not present

## 2023-05-29 DIAGNOSIS — I509 Heart failure, unspecified: Secondary | ICD-10-CM | POA: Diagnosis not present

## 2023-05-29 DIAGNOSIS — Z0000 Encounter for general adult medical examination without abnormal findings: Secondary | ICD-10-CM | POA: Diagnosis not present

## 2023-05-29 DIAGNOSIS — E119 Type 2 diabetes mellitus without complications: Secondary | ICD-10-CM | POA: Diagnosis not present

## 2023-05-29 DIAGNOSIS — C61 Malignant neoplasm of prostate: Secondary | ICD-10-CM | POA: Diagnosis not present

## 2023-05-29 DIAGNOSIS — F3341 Major depressive disorder, recurrent, in partial remission: Secondary | ICD-10-CM | POA: Diagnosis not present

## 2023-05-29 DIAGNOSIS — E1169 Type 2 diabetes mellitus with other specified complication: Secondary | ICD-10-CM | POA: Diagnosis not present

## 2023-05-29 DIAGNOSIS — R5383 Other fatigue: Secondary | ICD-10-CM | POA: Diagnosis not present

## 2023-05-29 DIAGNOSIS — E782 Mixed hyperlipidemia: Secondary | ICD-10-CM | POA: Diagnosis not present

## 2023-05-29 DIAGNOSIS — M5412 Radiculopathy, cervical region: Secondary | ICD-10-CM | POA: Diagnosis not present

## 2023-05-29 DIAGNOSIS — J449 Chronic obstructive pulmonary disease, unspecified: Secondary | ICD-10-CM | POA: Diagnosis not present

## 2023-07-01 DIAGNOSIS — M545 Low back pain, unspecified: Secondary | ICD-10-CM | POA: Diagnosis not present

## 2023-07-31 DIAGNOSIS — I10 Essential (primary) hypertension: Secondary | ICD-10-CM | POA: Diagnosis not present

## 2023-07-31 DIAGNOSIS — M170 Bilateral primary osteoarthritis of knee: Secondary | ICD-10-CM | POA: Diagnosis not present

## 2023-07-31 DIAGNOSIS — R001 Bradycardia, unspecified: Secondary | ICD-10-CM | POA: Diagnosis not present

## 2023-07-31 DIAGNOSIS — E119 Type 2 diabetes mellitus without complications: Secondary | ICD-10-CM | POA: Diagnosis not present

## 2023-07-31 DIAGNOSIS — I25708 Atherosclerosis of coronary artery bypass graft(s), unspecified, with other forms of angina pectoris: Secondary | ICD-10-CM | POA: Diagnosis not present

## 2023-07-31 DIAGNOSIS — E782 Mixed hyperlipidemia: Secondary | ICD-10-CM | POA: Diagnosis not present

## 2023-09-02 ENCOUNTER — Other Ambulatory Visit: Payer: Self-pay | Admitting: *Deleted

## 2023-09-02 DIAGNOSIS — E7800 Pure hypercholesterolemia, unspecified: Secondary | ICD-10-CM

## 2023-09-04 ENCOUNTER — Ambulatory Visit: Payer: Medicare HMO | Attending: Cardiology

## 2023-09-04 DIAGNOSIS — E7800 Pure hypercholesterolemia, unspecified: Secondary | ICD-10-CM | POA: Diagnosis not present

## 2023-09-05 LAB — LIPID PANEL
Chol/HDL Ratio: 2.8 ratio (ref 0.0–5.0)
Cholesterol, Total: 122 mg/dL (ref 100–199)
HDL: 44 mg/dL (ref >39)
LDL Chol Calc (NIH): 65 mg/dL (ref 0–99)
Triglycerides: 57 mg/dL (ref 0–149)
VLDL Cholesterol Cal: 13 mg/dL (ref 5–40)

## 2023-09-05 LAB — ALT: ALT: 16 [IU]/L (ref 0–44)

## 2023-09-09 ENCOUNTER — Telehealth: Payer: Self-pay

## 2023-09-09 ENCOUNTER — Telehealth: Payer: Self-pay | Admitting: Cardiology

## 2023-09-09 NOTE — Telephone Encounter (Signed)
Patient wants lab work to be faxed over to his PCP Dr. Tracie Harrier

## 2023-09-09 NOTE — Telephone Encounter (Signed)
Faxed recent labs per patient request.

## 2023-09-09 NOTE — Telephone Encounter (Signed)
-----   Message from Armanda Magic sent at 09/06/2023  6:11 PM EST ----- Normal labs

## 2023-09-09 NOTE — Telephone Encounter (Signed)
Call to patient to advise that cholesterol labs were normal. No answer, left detailed message advising that lipid panel and ALT were normal, advised patient to call our office if any questions.

## 2023-10-06 ENCOUNTER — Other Ambulatory Visit: Payer: Self-pay | Admitting: Thoracic Surgery (Cardiothoracic Vascular Surgery)

## 2023-10-06 DIAGNOSIS — I77810 Thoracic aortic ectasia: Secondary | ICD-10-CM

## 2023-10-14 ENCOUNTER — Encounter: Payer: Self-pay | Admitting: Thoracic Surgery (Cardiothoracic Vascular Surgery)

## 2023-10-26 ENCOUNTER — Ambulatory Visit
Admission: RE | Admit: 2023-10-26 | Discharge: 2023-10-26 | Disposition: A | Discharge status: Home / Self Care | Payer: Medicare HMO | Source: Ambulatory Visit | Attending: Thoracic Surgery (Cardiothoracic Vascular Surgery) | Admitting: Thoracic Surgery (Cardiothoracic Vascular Surgery)

## 2023-10-26 DIAGNOSIS — I700 Atherosclerosis of aorta: Secondary | ICD-10-CM | POA: Diagnosis not present

## 2023-10-26 DIAGNOSIS — I77810 Thoracic aortic ectasia: Secondary | ICD-10-CM

## 2023-10-26 DIAGNOSIS — K5730 Diverticulosis of large intestine without perforation or abscess without bleeding: Secondary | ICD-10-CM | POA: Diagnosis not present

## 2023-11-03 ENCOUNTER — Other Ambulatory Visit: Payer: Medicare HMO

## 2023-11-10 ENCOUNTER — Ambulatory Visit: Payer: Medicare HMO | Admitting: Thoracic Surgery (Cardiothoracic Vascular Surgery)

## 2023-11-10 VITALS — BP 152/88 | HR 77 | Resp 18 | Ht 75.0 in | Wt 300.0 lb

## 2023-11-10 DIAGNOSIS — I77810 Thoracic aortic ectasia: Secondary | ICD-10-CM | POA: Diagnosis not present

## 2023-11-10 NOTE — Progress Notes (Signed)
 [address]       [place] [address]             [phone]       HPI: Jonathan Miles returns for follow-up of his ascending aneurysm  Jonathan Miles is a 63 year old man with a history of an aortic root aneurysm, valve sparing aortic root replacement, CAD, CABG x 1, stroke, hypertension, hyperlipidemia, type 2 diabetes, obesity, obstructive sleep apnea, prostate cancer, anxiety, depression, and osteoarthritis.  He had a valve sparing root replacement and CABG x 1 by Dr. Army in 2013.  He has been followed since then.  I last saw him in January 2024.  His distal ascending aorta was stable at 4.2 cm.  He was having some issues with fatigue and shortness of breath in December.  His heart rate was dropping very low so his Coreg  dose was cut in half from 25 mg twice daily to 12.5 mg twice daily.  He has been feeling better since then.  No anginal pain.  Past Medical History:  Diagnosis Date   Alcohol abuse    Allergy    Anxiety    Aortic root dilatation (HCC)    a. echo 4/12: EF 55-60%, mod to marked dilated ascending aortic root;   b. MRA 4/12: Aortic root 5.5 cm, dilation of left and right coronary cusps, trileaflet aortic valve // Echo 2/19: Moderate LVH, EF 55-60, normal wall motion, grade 1 diastolic dysfunction, mild AI, mildly dilated aortic root, moderate to severe LAE (aortic root 40 mm; ascending aorta 39 mm)    Arthritis    Asthma    ast attack in early 20's   CAD (coronary artery disease)    Cancer (HCC)    prostate cancer   CHF (congestive heart failure) (HCC)    Chronic pain    back and hands   Constipation    COPD (chronic obstructive pulmonary disease) (HCC)    Depression    Diabetes mellitus    type 2   Difficulty speaking    Drug use    Enlarged prostate    Fatigue    GERD (gastroesophageal reflux disease)    modifies with diet   Hx of echocardiogram    Echo (2/16):  Mod LVH, EF 55-60%, mild to mod AI, mod LAE, mild RAE, Aortic  root 38 mm, Asc aorta 43 mm   Hyperlipidemia    Hypertension    Joint pain    Knee pain, bilateral    Leg pain    ABIs 3/19: Normal (R 1.3; L 1.28)   Neuromuscular disorder (HCC)    left foot ? neuropathy    OSA (obstructive sleep apnea) 2006   .  Wears at times   Osteoarthritis    Palpitations    Stroke [facility]) 2018    Current Outpatient Medications  Medication Sig Dispense Refill   acetaminophen  (TYLENOL ) 500 MG tablet TAKE 1-2 TABS EVERY 6 HOURS AS NEEDED FOR PAIN.  Do not take more than 4000 mg of Tylenol  per day it can harm your liver. 30 tablet 0   Armodafinil  150 MG tablet TAKE ONE TABLET BY MOUTH DAILY 30 tablet 5   aspirin  325 MG EC tablet Take 1 tablet (325 mg total) by mouth at bedtime. 90 tablet 3   atorvastatin  (LIPITOR ) 80 MG tablet TAKE ONE TABLET BY MOUTH DAILY AT 6PM 90 tablet 3   carvedilol  (COREG ) 25 MG tablet Take 1 tablet (25 mg total) by mouth  2 (two) times daily with a meal. 180 tablet 3   cyclobenzaprine (FLEXERIL) 10 MG tablet Take 1 tablet by mouth 3 (three) times daily.     felodipine  (PLENDIL ) 10 MG 24 hr tablet Take 1 tablet (10 mg total) by mouth daily. 90 tablet 3   furosemide  (LASIX ) 40 MG tablet Take 1 tablet (40 mg total) by mouth daily. 90 tablet 3   lidocaine  (XYLOCAINE ) 5 % ointment Apply 1 application topically as needed. Daily for leg pain 50 g 3   lisinopril  (ZESTRIL ) 40 MG tablet Take 1 tablet (40 mg total) by mouth daily. 90 tablet 3   loratadine  (CLARITIN ) 10 MG tablet Take 10 mg by mouth at bedtime.      Menthol -Methyl Salicylate (THERA-GESIC) 0.5-15 % CREA Apply 1 application topically daily as needed (pain).     metFORMIN  (GLUCOPHAGE ) 1000 MG tablet Take 1 tablet (1,000 mg total) by mouth 2 (two) times daily with a meal. 180 tablet 3   predniSONE  (DELTASONE ) 50 MG tablet Pt to take 50 mg of prednisone  on 10/21/22 at 8:20 PM, 50 mg of prednisone  on 10/22/22 at 2:20 AM, and 50 mg of prednisone  on 10/22/22 at 8:20 AM. Pt is also to take 50 mg of  benadryl  on 10/22/22 at 8:20 AM. Please call [phone] with any questions. 3 tablet 0   traMADol  (ULTRAM ) 50 MG tablet Take by mouth every 6 (six) hours as needed.     Trolamine Salicylate (ASPERCREME EX) Apply 1 application topically daily as needed (pain).     TURMERIC PO Take by mouth.     No current facility-administered medications for this visit.    Physical Exam BP (!) 152/88   Pulse 77   Resp 18   Ht 6' 3 (1.905 m)   Wt 300 lb (136.1 kg)   SpO2 98% Comment: RA  BMI 37.50 kg/m  Obese 63 year old man in no acute distress Alert and oriented x 3 with no focal deficits Lungs clear bilaterally Cardiac regular rate and rhythm with prominent S2, no murmur No carotid bruits No peripheral edema  Diagnostic Tests: CT CHEST WITHOUT CONTRAST   TECHNIQUE: Multidetector CT imaging of the chest was performed following the standard protocol without IV contrast.   RADIATION DOSE REDUCTION: This exam was performed according to the departmental dose-optimization program which includes automated exposure control, adjustment of the mA and/or kV according to patient size and/or use of iterative reconstruction technique.   COMPARISON:  10/22/2022 chest CT angiogram.   FINDINGS: Cardiovascular: Normal heart size. No significant pericardial effusion/thickening. Three-vessel coronary atherosclerosis status post CABG. Stable postsurgical changes from surgical graft repair of the ascending thoracic aorta. Mildly atherosclerotic thoracic aorta. Maximum ascending thoracic aortic diameter 4.2 cm, unchanged. No acute intramural hematoma in the thoracic aorta. Mildly tortuous descending thoracic aorta with maximum descending thoracic aorta diameter 3.5 cm. Normal caliber pulmonary arteries.   Mediastinum/Nodes: No significant thyroid  nodules. Unremarkable esophagus. No pathologically enlarged axillary, mediastinal or hilar lymph nodes, noting limited sensitivity for the detection of  hilar adenopathy on this noncontrast study. (Coarsely calcified left hilar node is unchanged and compatible with prior granulomatous disease.   Lungs/Pleura: No pneumothorax. No pleural effusion. Stable scattered calcified bilateral pulmonary granulomas, largest in the posterior basilar left lower lobe. No acute consolidative airspace disease, lung masses or significant pulmonary nodules.   Upper abdomen: Scattered granulomatous calcifications in the liver and spleen are unchanged. Cholecystectomy. Left colonic diverticulosis.   Musculoskeletal: No aggressive appearing focal osseous lesions. Moderate thoracic spondylosis.  Intact sternotomy wires.   IMPRESSION: 1. Stable postsurgical changes from surgical graft repair of the ascending thoracic aorta. Maximum ascending thoracic aortic diameter 4.2 cm, unchanged. No acute abnormality on this noncontrast scan. 2. Left colonic diverticulosis. 3.  Aortic Atherosclerosis (ICD10-I70.0).     Electronically Signed   By: Selinda DELENA Blue M.D.   On: 10/26/2023 13:00 I personally reviewed the CT images.  Prior aortic root replacement.  Mild aneurysmal dilatation of ascending aorta at 4.2 cm.  Stable.  Aortic atherosclerosis.  Calcified left hilar node and pulmonary nodules.  Impression: Jonathan Miles is a 63 year old man with a history of an aortic root aneurysm, valve sparing aortic root replacement, CAD, CABG x 1, stroke, hypertension, hyperlipidemia, type 2 diabetes, obesity, obstructive sleep apnea, prostate cancer, anxiety, depression, and osteoarthritis.  Aortic root aneurysm-status post valve sparing aortic root replacement.  Distal ascending/proximal arch aneurysm-stable at 4.1 to 4.2 cm.  Hypertension-blood pressure elevated.  May be related to his recent decrease in his Coreg  dose.  Recommended he follow-up with Dr. Rolinda and Dr. Shlomo for management of his blood pressure.  Hyperlipidemia-on Lipitor .   Plan: Return in 1 year  with CT chest.  Will plan to do without contrast.  I spent over 20 minutes in review of records, images, and in consultation with Jonathan Miles today. Elspeth JAYSON Millers, MD [facility] [phone]

## 2023-11-17 ENCOUNTER — Other Ambulatory Visit (INDEPENDENT_AMBULATORY_CARE_PROVIDER_SITE_OTHER): Payer: No Typology Code available for payment source

## 2023-11-17 ENCOUNTER — Ambulatory Visit: Payer: No Typology Code available for payment source | Admitting: Orthopaedic Surgery

## 2023-11-17 DIAGNOSIS — G8929 Other chronic pain: Secondary | ICD-10-CM

## 2023-11-17 DIAGNOSIS — M25512 Pain in left shoulder: Secondary | ICD-10-CM

## 2023-11-17 MED ORDER — METHYLPREDNISOLONE ACETATE 40 MG/ML IJ SUSP
40.0000 mg | INTRAMUSCULAR | Status: AC | PRN
  Administered 2023-11-17: 40 mg via INTRA_ARTICULAR

## 2023-11-17 MED ORDER — LIDOCAINE HCL 1 % IJ SOLN
3.0000 mL | INTRAMUSCULAR | Status: AC | PRN
  Administered 2023-11-17: 3 mL

## 2023-11-17 MED ORDER — BUPIVACAINE HCL 0.5 % IJ SOLN
3.0000 mL | INTRAMUSCULAR | Status: AC | PRN
  Administered 2023-11-17: 3 mL via INTRA_ARTICULAR

## 2023-11-17 NOTE — Progress Notes (Signed)
Office Visit Note   Patient: Jonathan Miles           Date of Birth: 1961-03-14           MRN: [id number] Visit Date: 11/17/2023              Requested by: Aliene Beams, MD [id number] [place],  [place] [address] PCP: Aliene Beams, MD   Assessment & Plan: Visit Diagnoses:  1. Chronic left shoulder pain     Plan: Teresa is a 63 year old gentleman with left shoulder pain probable impingement and subacromial bursitis.  May have a component of rotator cuff tendinosis.  Treatment options given and he would like to try subacromial injection and a period of rest.  Follow-up if symptoms do not improve in about 6 weeks.  Follow-Up Instructions: No follow-ups on file.   Orders:  Orders Placed This Encounter  Procedures   Large Joint Inj   XR Shoulder Left   XR Cervical Spine 2 or 3 views   No orders of the defined types were placed in this encounter.     Procedures: Large Joint Inj: L subacromial bursa on 11/17/2023 1:30 PM Indications: pain Details: 22 G needle  Arthrogram: No  Medications: 3 mL lidocaine 1 %; 3 mL bupivacaine 0.5 %; 40 mg methylPREDNISolone acetate 40 MG/ML Outcome: tolerated well, no immediate complications Patient was prepped and draped in the usual sterile fashion.       Clinical Data: No additional findings.   Subjective: Chief Complaint  Patient presents with   Left Shoulder - Pain    HPI Jonathan Miles comes in today for evaluation of left shoulder pain that radiates to the shoulder blade and sometimes into the neck.  Denies any radicular symptoms.  He is an avid Teacher, music. Review of Systems  Constitutional: Negative.   HENT: Negative.    Eyes: Negative.   Respiratory: Negative.    Cardiovascular: Negative.   Gastrointestinal: Negative.   Endocrine: Negative.   Genitourinary: Negative.   Skin: Negative.   Allergic/Immunologic: Negative.   Neurological: Negative.   Hematological: Negative.   Psychiatric/Behavioral:  Negative.    All other systems reviewed and are negative.    Objective: Vital Signs: There were no vitals taken for this visit.  Physical Exam Vitals and nursing note reviewed.  Constitutional:      Appearance: He is well-developed.  HENT:     Head: Normocephalic and atraumatic.  Eyes:     Pupils: Pupils are equal, round, and reactive to light.  Pulmonary:     Effort: Pulmonary effort is normal.  Abdominal:     Palpations: Abdomen is soft.  Musculoskeletal:        General: Normal range of motion.     Cervical back: Neck supple.  Skin:    General: Skin is warm.  Neurological:     Mental Status: He is alert and oriented to person, place, and time.  Psychiatric:        Behavior: Behavior normal.        Thought Content: Thought content normal.        Judgment: Judgment normal.     Ortho Exam Examination of the cervical spine shows limited extension.  Pain with Spurling test but without true finding.  No focal deficits. Examination of the left shoulder shows pain with Hawkins and Neer impingement.  Rotator cuff intact to manual muscle testing. Specialty Comments:  No specialty comments available.  Imaging: XR Shoulder Left Result Date: 11/17/2023 X-rays  of the left shoulder show bone-on-bone AC joint space narrowing.  Unfavorable acromial anatomy.  Glenohumeral joint space appears to be well preserved.  XR Cervical Spine 2 or 3 views Result Date: 11/17/2023 X-rays of the cervical spine show multilevel degenerative disc disease towards the lower segments.  No acute abnormalities.    PMFS History: Patient Active Problem List   Diagnosis Date Noted   Acute cholecystitis 11/23/2018   Late effect of stroke 12/26/2017   History of stroke 12/16/2017   BMI 36.0-36.9,adult 08/05/2017   Cholelithiasis 08/05/2017   Bilateral plantar fasciitis 08/05/2017   Situational mixed anxiety and depressive disorder 08/05/2017   Bilateral primary osteoarthritis of knee 07/01/2017    Asthma 01/26/2017   Overactive bladder 01/21/2017   BPH with obstruction/lower urinary tract symptoms 01/21/2017   S/P appendectomy 01/07/2017   Diverticulosis 11/24/2016   Type 2 diabetes mellitus without complication, without long-term current use of insulin (HCC) 10/31/2016   Aortic insufficiency 11/23/2012   Chronic diastolic CHF (congestive heart failure) (HCC) 11/23/2012   CAD (coronary artery disease) 11/27/2011   OSA (obstructive sleep apnea) 11/11/2011   Aortic root dilatation (HCC)    Hyperlipidemia 01/23/2011   Hypertension 01/23/2011   Past Medical History:  Diagnosis Date   Alcohol abuse    Allergy    Anxiety    Aortic root dilatation (HCC)    a. echo 4/12: EF 55-60%, mod to marked dilated ascending aortic root;   b. MRA 4/12: Aortic root 5.5 cm, dilation of left and right coronary cusps, trileaflet aortic valve // Echo 2/19: Moderate LVH, EF 55-60, normal wall motion, grade 1 diastolic dysfunction, mild AI, mildly dilated aortic root, moderate to severe LAE (aortic root 40 mm; ascending aorta 39 mm)    Arthritis    Asthma    ast attack in early 20's   CAD (coronary artery disease)    Cancer (HCC)    prostate cancer   CHF (congestive heart failure) (HCC)    Chronic pain    back and hands   Constipation    COPD (chronic obstructive pulmonary disease) (HCC)    Depression    Diabetes mellitus    type 2   Difficulty speaking    Drug use    Enlarged prostate    Fatigue    GERD (gastroesophageal reflux disease)    modifies with diet   Hx of echocardiogram    Echo (2/16):  Mod LVH, EF 55-60%, mild to mod AI, mod LAE, mild RAE, Aortic root 38 mm, Asc aorta 43 mm   Hyperlipidemia    Hypertension    Joint pain    Knee pain, bilateral    Leg pain    ABIs 3/19: Normal (R 1.3; L 1.28)   Neuromuscular disorder (HCC)    left foot ? neuropathy    OSA (obstructive sleep apnea) 2006   .  Wears at times   Osteoarthritis    Palpitations    Stroke [facility]) 2018    Family  History  Adopted: Yes    Past Surgical History:  Procedure Laterality Date   APPENDECTOMY  12/2016   CHOLECYSTECTOMY N/A 11/24/2018   Procedure: LAPAROSCOPIC CHOLECYSTECTOMY;  Surgeon: Almond Lint, MD;  Location: [facility] ORS;  Service: General;  Laterality: N/A;   COLONOSCOPY  2011   Colonscopy     CORONARY ARTERY BYPASS GRAFT  05/07/2012   Procedure: CORONARY ARTERY BYPASS GRAFTING (CABG);  Surgeon: Delight Ovens, MD;  Location: [facility];  Service: Open Heart Surgery;  Laterality:  N/A;   KNEE ARTHROSCOPY Left 2010   Dr. 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  Tobacco Use   Smoking status: Former    Current packs/day: 0.00    Average packs/day: 1 pack/day for 13.0 years (13.0 ttl pk-yrs)    Types: Cigarettes    Start date: 01/23/1975    Quit date: 01/23/1988    Years since quitting: 35.8   Smokeless tobacco: Never  Vaping Use   Vaping status: Never Used  Substance and Sexual Activity   Alcohol use: Not Currently    Comment: quit 1989   Drug use: No    Comment: previous cocaine and marijuana use, quit 1988   Sexual activity: Not Currently    Partners: Female

## 2023-11-20 DIAGNOSIS — R2681 Unsteadiness on feet: Secondary | ICD-10-CM | POA: Diagnosis not present

## 2023-11-20 DIAGNOSIS — Z6836 Body mass index (BMI) 36.0-36.9, adult: Secondary | ICD-10-CM | POA: Diagnosis not present

## 2023-11-20 DIAGNOSIS — G4733 Obstructive sleep apnea (adult) (pediatric): Secondary | ICD-10-CM | POA: Diagnosis not present

## 2023-11-20 DIAGNOSIS — F325 Major depressive disorder, single episode, in full remission: Secondary | ICD-10-CM | POA: Diagnosis not present

## 2023-11-20 DIAGNOSIS — I5032 Chronic diastolic (congestive) heart failure: Secondary | ICD-10-CM | POA: Diagnosis not present

## 2023-11-20 DIAGNOSIS — E785 Hyperlipidemia, unspecified: Secondary | ICD-10-CM | POA: Diagnosis not present

## 2023-11-20 DIAGNOSIS — I2510 Atherosclerotic heart disease of native coronary artery without angina pectoris: Secondary | ICD-10-CM | POA: Diagnosis not present

## 2023-11-20 DIAGNOSIS — Z860100 Personal history of colon polyps, unspecified: Secondary | ICD-10-CM | POA: Diagnosis not present

## 2023-11-20 DIAGNOSIS — I110 Hypertensive heart disease with heart failure: Secondary | ICD-10-CM | POA: Diagnosis not present

## 2023-11-20 DIAGNOSIS — E1169 Type 2 diabetes mellitus with other specified complication: Secondary | ICD-10-CM | POA: Diagnosis not present

## 2023-11-20 DIAGNOSIS — Z8546 Personal history of malignant neoplasm of prostate: Secondary | ICD-10-CM | POA: Diagnosis not present

## 2023-11-20 DIAGNOSIS — M1990 Unspecified osteoarthritis, unspecified site: Secondary | ICD-10-CM | POA: Diagnosis not present

## 2023-11-20 DIAGNOSIS — Z8673 Personal history of transient ischemic attack (TIA), and cerebral infarction without residual deficits: Secondary | ICD-10-CM | POA: Diagnosis not present

## 2023-11-20 DIAGNOSIS — F17211 Nicotine dependence, cigarettes, in remission: Secondary | ICD-10-CM | POA: Diagnosis not present

## 2023-11-20 DIAGNOSIS — Z008 Encounter for other general examination: Secondary | ICD-10-CM | POA: Diagnosis not present

## 2024-03-08 ENCOUNTER — Other Ambulatory Visit: Payer: Self-pay | Admitting: Cardiology

## 2024-03-08 DIAGNOSIS — E7800 Pure hypercholesterolemia, unspecified: Secondary | ICD-10-CM

## 2024-03-08 DIAGNOSIS — I10 Essential (primary) hypertension: Secondary | ICD-10-CM

## 2024-03-08 DIAGNOSIS — I2510 Atherosclerotic heart disease of native coronary artery without angina pectoris: Secondary | ICD-10-CM

## 2024-03-08 DIAGNOSIS — I5032 Chronic diastolic (congestive) heart failure: Secondary | ICD-10-CM

## 2024-03-08 DIAGNOSIS — Z9889 Other specified postprocedural states: Secondary | ICD-10-CM

## 2024-03-16 ENCOUNTER — Other Ambulatory Visit: Payer: Self-pay

## 2024-03-16 DIAGNOSIS — Z9889 Other specified postprocedural states: Secondary | ICD-10-CM | POA: Diagnosis not present

## 2024-03-16 DIAGNOSIS — I5032 Chronic diastolic (congestive) heart failure: Secondary | ICD-10-CM

## 2024-03-16 DIAGNOSIS — Z79899 Other long term (current) drug therapy: Secondary | ICD-10-CM | POA: Diagnosis not present

## 2024-03-16 DIAGNOSIS — E782 Mixed hyperlipidemia: Secondary | ICD-10-CM | POA: Diagnosis not present

## 2024-03-16 DIAGNOSIS — E1169 Type 2 diabetes mellitus with other specified complication: Secondary | ICD-10-CM | POA: Diagnosis not present

## 2024-03-16 DIAGNOSIS — Z8546 Personal history of malignant neoplasm of prostate: Secondary | ICD-10-CM | POA: Diagnosis not present

## 2024-03-16 DIAGNOSIS — E7800 Pure hypercholesterolemia, unspecified: Secondary | ICD-10-CM

## 2024-03-16 DIAGNOSIS — I10 Essential (primary) hypertension: Secondary | ICD-10-CM | POA: Diagnosis not present

## 2024-03-16 DIAGNOSIS — R5383 Other fatigue: Secondary | ICD-10-CM | POA: Diagnosis not present

## 2024-03-16 DIAGNOSIS — Z8673 Personal history of transient ischemic attack (TIA), and cerebral infarction without residual deficits: Secondary | ICD-10-CM | POA: Diagnosis not present

## 2024-03-16 DIAGNOSIS — M170 Bilateral primary osteoarthritis of knee: Secondary | ICD-10-CM | POA: Diagnosis not present

## 2024-03-16 DIAGNOSIS — I2510 Atherosclerotic heart disease of native coronary artery without angina pectoris: Secondary | ICD-10-CM

## 2024-03-16 DIAGNOSIS — I25708 Atherosclerosis of coronary artery bypass graft(s), unspecified, with other forms of angina pectoris: Secondary | ICD-10-CM | POA: Diagnosis not present

## 2024-03-16 MED ORDER — CARVEDILOL 25 MG PO TABS: 25.0000 mg | ORAL_TABLET | Freq: Two times a day (BID) | ORAL | 0 refills | Status: DC

## 2024-03-18 ENCOUNTER — Encounter: Payer: Self-pay | Admitting: Cardiology

## 2024-03-18 ENCOUNTER — Ambulatory Visit: Attending: Cardiology | Admitting: Cardiology

## 2024-03-18 VITALS — BP 122/70 | HR 62 | Ht 75.5 in | Wt 299.2 lb

## 2024-03-18 DIAGNOSIS — E7800 Pure hypercholesterolemia, unspecified: Secondary | ICD-10-CM

## 2024-03-18 DIAGNOSIS — I5032 Chronic diastolic (congestive) heart failure: Secondary | ICD-10-CM

## 2024-03-18 DIAGNOSIS — G4733 Obstructive sleep apnea (adult) (pediatric): Secondary | ICD-10-CM | POA: Diagnosis not present

## 2024-03-18 DIAGNOSIS — Z9889 Other specified postprocedural states: Secondary | ICD-10-CM | POA: Diagnosis not present

## 2024-03-18 DIAGNOSIS — I2510 Atherosclerotic heart disease of native coronary artery without angina pectoris: Secondary | ICD-10-CM | POA: Diagnosis not present

## 2024-03-18 DIAGNOSIS — I10 Essential (primary) hypertension: Secondary | ICD-10-CM | POA: Diagnosis not present

## 2024-03-18 NOTE — Progress Notes (Signed)
 Cardiology Office Note:    Date:  03/18/2024   ID:  Jonathan Miles, DOB [birth year], MRN [id number]  PCP:  Jonathan Gander, MD  [facility] Cardiologist:  Jonathan Keas, MD  [facility] Electrophysiologist:  None   Chief Complaint: yearly follow up   History of Present Illness:    Jonathan Miles is a 63 y.o. 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 Matilde Miles) presents for follow-up.  He is here today for followup and is doing well.  He denies any chest pain or pressure, SOB, DOE, PND, orthopnea, LE edema, dizziness (occasionally from sitting to standing) or syncope.  He has had some fluttering in his chest during a time when he was stressed but after starting deep breathing and relaxation the palpitations have resolved.  He is compliant with his meds and is tolerating meds with no SE.    Past Medical History:  Diagnosis Date   Alcohol abuse    Allergy    Anxiety    Aortic root dilatation (HCC)    a. echo 4/12: EF 55-60%, mod to marked dilated ascending aortic root;   b. MRA 4/12: Aortic root 5.5 cm, dilation of left and right coronary cusps, trileaflet aortic valve // Echo 2/19: Moderate LVH, EF 55-60, normal wall motion, grade 1 diastolic dysfunction, mild AI, mildly dilated aortic root, moderate to severe LAE (aortic root 40 mm; ascending aorta 39 mm)    Arthritis    Asthma    ast attack in early 20's   CAD (coronary artery disease)    Cancer (HCC)    prostate cancer   CHF (congestive heart failure) (HCC)    Chronic pain    back and hands   Constipation    COPD (chronic obstructive pulmonary disease) (HCC)    Depression    Diabetes mellitus    type 2   Difficulty speaking    Drug use    Enlarged prostate    Fatigue    GERD (gastroesophageal reflux  disease)    modifies with diet   Hx of echocardiogram    Echo (2/16):  Mod LVH, EF 55-60%, mild to mod AI, mod LAE, mild RAE, Aortic root 38 mm, Asc aorta 43 mm   Hyperlipidemia    Hypertension    Joint pain    Knee pain, bilateral    Leg pain    ABIs 3/19: Normal (R 1.3; L 1.28)   Neuromuscular disorder (HCC)    left foot ? neuropathy    OSA (obstructive sleep apnea) 2006   .  Wears at times   Osteoarthritis    Palpitations    Stroke [facility]) 2018    Past Surgical History:  Procedure Laterality Date   APPENDECTOMY  12/2016   CHOLECYSTECTOMY N/A 11/24/2018   Procedure: LAPAROSCOPIC CHOLECYSTECTOMY;  Surgeon: Jonathan Rima, MD;  Location: [facility] ORS;  Service: General;  Laterality: N/A;   COLONOSCOPY  2011   Colonscopy     CORONARY ARTERY BYPASS GRAFT  05/07/2012   Procedure: CORONARY ARTERY BYPASS GRAFTING (CABG);  Surgeon: Jonathan Beauvais, MD;  Location: [facility] OR;  Service: Open Heart Surgery;  Laterality: N/A;   KNEE ARTHROSCOPY Left 2010   Dr. Monika Miles   LAPAROSCOPIC APPENDECTOMY N/A 01/06/2017   Procedure: APPENDECTOMY LAPAROSCOPIC;  Surgeon: Jonathan Derby, MD;  Location: [facility] OR;  Service: General;  Laterality: N/A;   Retactment of fingers     as a child , sewed with sewing machine- Index and middle finger   UPPER GASTROINTESTINAL ENDOSCOPY      Current Medications: Current Meds  Medication Sig   acetaminophen  (TYLENOL ) 500 MG tablet TAKE 1-2 TABS EVERY 6 HOURS AS NEEDED FOR PAIN.  Do not take more than 4000 mg of Tylenol  per day it can harm your liver.   amitriptyline  (ELAVIL ) 25 MG tablet at bedtime as needed for sleep.   Armodafinil  150 MG tablet TAKE ONE TABLET BY MOUTH DAILY   aspirin  325 MG EC tablet Take 1 tablet (325 mg total) by mouth at bedtime.   atorvastatin  (LIPITOR ) 80 MG tablet TAKE ONE TABLET BY MOUTH DAILY AT 6PM   carvedilol  (COREG ) 25 MG tablet Take 1 tablet (25 mg total) by mouth 2 (two) times daily with a meal.   cyclobenzaprine (FLEXERIL) 10 MG tablet  Take 1 tablet by mouth 3 (three) times daily.   felodipine  (PLENDIL ) 10 MG 24 hr tablet Take 1 tablet (10 mg total) by mouth daily.   furosemide  (LASIX ) 40 MG tablet Take 1 tablet (40 mg total) by mouth daily.   lidocaine  (XYLOCAINE ) 5 % ointment Apply 1 application topically as needed. Daily for leg pain   lisinopril  (ZESTRIL ) 40 MG tablet Take 1 tablet (40 mg total) by mouth daily.   loratadine  (CLARITIN ) 10 MG tablet Take 10 mg by mouth at bedtime.    Menthol -Methyl Salicylate (THERA-GESIC) 0.5-15 % CREA Apply 1 application topically daily as needed (pain).   metFORMIN  (GLUCOPHAGE ) 1000 MG tablet Take 1 tablet (1,000 mg total) by mouth 2 (two) times daily with a meal.   oxybutynin (DITROPAN) 5 MG tablet 1 tablet Orally Twice a day as needed for bladder spasms   traMADol  (ULTRAM ) 50 MG tablet Take by mouth every 6 (six) hours as needed.   Trolamine Salicylate (ASPERCREME EX) Apply 1 application topically daily as needed (pain).   TURMERIC PO Take by mouth.     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  Tobacco Use   Smoking status: Former    Current packs/day: 0.00    Average packs/day: 1 pack/day for 13.0 years (13.0 ttl pk-yrs)    Types: Cigarettes    Start date: 01/23/1975    Quit date: 01/23/1988    Years since quitting: 36.1   Smokeless tobacco: Never  Vaping Use   Vaping status: Never Used  Substance and Sexual Activity   Alcohol use: Not Currently    Comment: quit 1989   Drug use: No    Comment: previous cocaine and marijuana use, quit 1988   Sexual activity: Not Currently    Partners: Female  Other Topics Concern   Not on file   Social History Narrative   Married, lives in [place]. 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    Pt is adopted. Unsure of his family medical history.   Social Drivers of Corporate investment banker Strain: Not on file  Food Insecurity: Not on file  Transportation Needs: No Transportation Needs (07/07/2019)   [facility]    Lack of Transportation (Medical): No    Lack of Transportation (Non-Medical): No  Physical Activity: Not on file  Stress: Stress Concern Present (05/23/2019)   Received from [facility] - Occupational Stress Questionnaire    Feeling of Stress : Rather much  Social Connections: Unknown (03/07/2022)   Received from [facility], [facility]   Social Network    Social Network: Not on file     Family History: The patient's family history is not on file. He was adopted.    ROS:   Please see the history of present illness.    All other systems reviewed and are negative.   EKGs/Labs/Other Studies Reviewed:    The following studies were reviewed today:  Echo 11/2017 Study Conclusions   - Left ventricle: The cavity size was normal. Wall thickness was    increased in a pattern of moderate LVH. Systolic function was    normal. The estimated ejection fraction was in the range of 55%    to 60%. Wall motion was normal; there were no regional wall    motion abnormalities. Doppler parameters are consistent with    abnormal left ventricular relaxation (grade 1 diastolic    dysfunction).  - Aortic valve: There was mild regurgitation. Valve area (VTI):    3.48 cm^2. Valve area (Vmax): 3.49 cm^2. Valve area (Vmean): 3.31    cm^2.  - Aortic root: The aortic root was mildly dilated.  - Left atrium: The atrium was moderately to severely dilated.   EKG Interpretation Date/Time:  Friday Mar 18 2024 14:27:02 EDT Ventricular Rate:  62 PR  Interval:  246 QRS Duration:  94 QT Interval:  408 QTC Calculation: 414 R Axis:   41  Text Interpretation: Sinus rhythm with 1st degree A-V block When compared with ECG of 24-Nov-2018 08:34, PR interval has increased Nonspecific T wave abnormality no longer evident in Inferior leads Confirmed by Jonathan Miles [phone]) on 03/18/2024 2:37:58 PM    Recent Labs: 09/04/2023: ALT 16  Recent Lipid Panel    Component Value Date/Time   CHOL 122 09/04/2023 0835   TRIG 57 09/04/2023 0835   HDL 44 09/04/2023 0835   CHOLHDL 2.8 09/04/2023 0835   CHOLHDL 4.0 12/17/2017 0639   VLDL 18 12/17/2017 0639   LDLCALC 65 09/04/2023 0835    VS:  BP 122/70 (BP Location: Left Arm, Patient Position: Sitting, Cuff Size: Normal)   Pulse 62   Ht 6' 3.5" (1.918 m)   Wt 299 lb 3.2 oz (135.7 kg)   SpO2 96%   BMI 36.90 kg/m     Wt Readings from Last 3 Encounters:  03/18/24 299 lb 3.2 oz (135.7 kg)  11/10/23 300 lb (136.1 kg)  03/04/23 298 lb 9.6 oz (135.4 kg)  GEN: Well nourished, well developed in no acute distress HEENT: Normal NECK: No JVD; No carotid bruits LYMPHATICS: No lymphadenopathy CARDIAC:RRR, no murmurs, rubs, gallops RESPIRATORY:  Clear to auscultation without rales, wheezing or rhonchi  ABDOMEN: Soft, non-tender, non-distended MUSCULOSKELETAL:  No edema; No deformity  SKIN: Warm and dry NEUROLOGIC:  Alert and oriented x 3 PSYCHIATRIC:  Normal affect  ASSESSMENT AND PLAN:    CAD  -s/p single-vessel CABG 2013 -He has not had any anginal symptoms since I saw him last -Continue aspirin  but decrease to 81 mg daily, carvedilol  25 mg twice daily, atorvastatin  80 mg daily with as needed  refills  2.  Aortic root aneurysm s/p repair/aortic atherosclerosis -He is followed by CTS surgery -Chest CTA 10/11/2021 showed stable postsurgical anatomy with graft repair of the ascending thoracic aorta with no aneurysm or dissection noted. -Chest CT without contrast 10/16/2023 showed stable graft repair  site with maximum ascending thoracic aortic diameter 4.2 cm. -Blood pressure is well controlled -he follows with CVTS Dr. Luna Salinas yearly  3.  Hypertension - BP controlled on exam today -Continue carvedilol  25 mg twice daily, Plendil  10 mg daily, lisinopril  40 mg daily with as needed refills -Check BMP that was recently done by his PCP  4.  Hyperlipidemia -LDL goal is less than 70  -I have personally reviewed and interpreted outside labs performed by patient's PCP which showed LDL 65, HDL 44 and ALT 16 on 09/04/2023 -Continue atorvastatin  80 mg daily with as needed refills -he stopped taking Zetia  because he did not want to take so many pills and will work on diet  5.  Chronic diastolic heart failure - Appears euvolemic on exam today -Continue Lasix  40 mg daily with as needed refills  6.  OSA -he had severe OSA in 2018 and was on CPAP but did not tolerate it -his wife says he snores a lot and stops breathing in his sleep -he has excessive daytime sleepiness -last PSG in 2018 showed AHI 70/hr -I will get a HST and if still significant will refer to ENT for Inspire   Medication Adjustments/Labs and Tests Ordered: Current medicines are reviewed at length with the patient today.  Concerns regarding medicines are outlined above.  Orders Placed This Encounter  Procedures   EKG 12-Lead    No orders of the defined types were placed in this encounter.    There are no Patient Instructions on file for this visit.   Signed, Jonathan Keas, MD  03/18/2024 2:44 PM    [facility]

## 2024-03-18 NOTE — Patient Instructions (Signed)
 Medication Instructions:  Your physician recommends that you continue on your current medications as directed. Please refer to the Current Medication list given to you today.  *If you need a refill on your cardiac medications before your next appointment, please call your pharmacy*  Lab Work: None.   If you have labs (blood work) drawn today and your tests are completely normal, you will receive your results only by: MyChart Message (if you have MyChart) OR A paper copy in the mail If you have any lab test that is abnormal or we need to change your treatment, we will call you to review the results.  Testing/Procedures: Your physician has recommended that you have a home sleep study. This test records several body functions during sleep, including: brain activity, eye movement, oxygen and carbon dioxide blood levels, heart rate and rhythm, breathing rate and rhythm, the flow of air through your mouth and nose, snoring, body muscle movements, and chest and belly movement.   Follow-Up: At [facility], you and your health needs are our priority.  As part of our continuing mission to provide you with exceptional heart care, our providers are all part of one team.  This team includes your primary Cardiologist (physician) and Advanced Practice Providers or APPs (Physician Assistants and Nurse Practitioners) who all work together to provide you with the care you need, when you need it.  Your next appointment:   1 year(s)  Provider:   Gaylyn Keas, MD     Other Instructions Please request that your PCP forward your most recent lab work to our office. Our fax number is [phone].

## 2024-03-18 NOTE — Addendum Note (Signed)
 Addended by: Cherylyn Cos on: 03/18/2024 02:56 PM   Modules accepted: Orders

## 2024-03-22 ENCOUNTER — Telehealth: Payer: Self-pay

## 2024-03-22 NOTE — Telephone Encounter (Signed)
 Patient agreement reviewed and signed on 03/18/2024.  WatchPAT issued to patient on 03/18/2024 by Maceo Sax, LPN. Patient aware to not open the WatchPAT box until contacted with the activation PIN. Patient profile initialized in CloudPAT on 03/22/2024 by Enedina Harrow, LPN. Device serial number: [id number]  Ordering provider: Micael Adas Associated diagnoses: OSA, CAD, CHF WatchPAT PA obtained on 03/22/2024 by Maceo Sax, LPN. Authorization: Yes; tracking ID No PA Rerquired Patient notified of PIN (1234) on 03/22/2024 via Notification Method: phone.  Phone note routed to covering staff for follow-up.  Instructions for covering staff:  Please contact patient in 2 weeks if WatchPAT study results are not available yet. Remind patient to complete test.  If patient declines to proceed with test, please confirm that box is unopened and remind patient to return it to the office within 30 days. Route phone note to [facility] [employer]EP STUDIES pool for tracking.  If box has been opened, please route phone note to [facility] [employer]EP STUDIES pool to have device de-initialized and processed for billing.

## 2024-03-31 ENCOUNTER — Other Ambulatory Visit: Payer: Self-pay | Admitting: Cardiology

## 2024-04-13 ENCOUNTER — Other Ambulatory Visit: Payer: Self-pay | Admitting: Cardiology

## 2024-04-13 DIAGNOSIS — Z9889 Other specified postprocedural states: Secondary | ICD-10-CM

## 2024-04-13 DIAGNOSIS — I2510 Atherosclerotic heart disease of native coronary artery without angina pectoris: Secondary | ICD-10-CM

## 2024-04-13 DIAGNOSIS — I5032 Chronic diastolic (congestive) heart failure: Secondary | ICD-10-CM

## 2024-04-13 DIAGNOSIS — I10 Essential (primary) hypertension: Secondary | ICD-10-CM

## 2024-04-13 DIAGNOSIS — E7800 Pure hypercholesterolemia, unspecified: Secondary | ICD-10-CM

## 2024-04-14 NOTE — Telephone Encounter (Signed)
 Pt of Dr. Micael Adas. Please advise on his refills of Felodipine .

## 2024-05-10 ENCOUNTER — Other Ambulatory Visit: Payer: Self-pay | Admitting: Cardiology

## 2024-05-10 DIAGNOSIS — Z9889 Other specified postprocedural states: Secondary | ICD-10-CM

## 2024-05-10 DIAGNOSIS — I10 Essential (primary) hypertension: Secondary | ICD-10-CM

## 2024-05-10 DIAGNOSIS — I2510 Atherosclerotic heart disease of native coronary artery without angina pectoris: Secondary | ICD-10-CM

## 2024-05-10 DIAGNOSIS — I5032 Chronic diastolic (congestive) heart failure: Secondary | ICD-10-CM

## 2024-05-10 DIAGNOSIS — E7800 Pure hypercholesterolemia, unspecified: Secondary | ICD-10-CM

## 2024-05-19 ENCOUNTER — Other Ambulatory Visit: Payer: Self-pay | Admitting: Cardiology

## 2024-05-19 DIAGNOSIS — E7800 Pure hypercholesterolemia, unspecified: Secondary | ICD-10-CM

## 2024-05-19 DIAGNOSIS — I5032 Chronic diastolic (congestive) heart failure: Secondary | ICD-10-CM

## 2024-05-19 DIAGNOSIS — I2510 Atherosclerotic heart disease of native coronary artery without angina pectoris: Secondary | ICD-10-CM

## 2024-05-19 DIAGNOSIS — I10 Essential (primary) hypertension: Secondary | ICD-10-CM

## 2024-05-19 DIAGNOSIS — Z9889 Other specified postprocedural states: Secondary | ICD-10-CM

## 2024-06-29 DIAGNOSIS — Z8546 Personal history of malignant neoplasm of prostate: Secondary | ICD-10-CM | POA: Diagnosis not present

## 2024-06-29 DIAGNOSIS — Z0000 Encounter for general adult medical examination without abnormal findings: Secondary | ICD-10-CM | POA: Diagnosis not present

## 2024-06-29 DIAGNOSIS — I25708 Atherosclerosis of coronary artery bypass graft(s), unspecified, with other forms of angina pectoris: Secondary | ICD-10-CM | POA: Diagnosis not present

## 2024-06-29 DIAGNOSIS — Z9889 Other specified postprocedural states: Secondary | ICD-10-CM | POA: Diagnosis not present

## 2024-06-29 DIAGNOSIS — E1169 Type 2 diabetes mellitus with other specified complication: Secondary | ICD-10-CM | POA: Diagnosis not present

## 2024-06-29 DIAGNOSIS — R809 Proteinuria, unspecified: Secondary | ICD-10-CM | POA: Diagnosis not present

## 2024-06-29 DIAGNOSIS — E782 Mixed hyperlipidemia: Secondary | ICD-10-CM | POA: Diagnosis not present

## 2024-06-29 DIAGNOSIS — E1129 Type 2 diabetes mellitus with other diabetic kidney complication: Secondary | ICD-10-CM | POA: Diagnosis not present

## 2024-06-29 DIAGNOSIS — Z125 Encounter for screening for malignant neoplasm of prostate: Secondary | ICD-10-CM | POA: Diagnosis not present

## 2024-06-29 DIAGNOSIS — Z8673 Personal history of transient ischemic attack (TIA), and cerebral infarction without residual deficits: Secondary | ICD-10-CM | POA: Diagnosis not present

## 2024-06-29 DIAGNOSIS — Z79899 Other long term (current) drug therapy: Secondary | ICD-10-CM | POA: Diagnosis not present

## 2024-06-29 DIAGNOSIS — I509 Heart failure, unspecified: Secondary | ICD-10-CM | POA: Diagnosis not present

## 2024-06-29 DIAGNOSIS — I10 Essential (primary) hypertension: Secondary | ICD-10-CM | POA: Diagnosis not present

## 2024-07-06 DIAGNOSIS — Z23 Encounter for immunization: Secondary | ICD-10-CM | POA: Diagnosis not present

## 2024-07-18 DIAGNOSIS — E785 Hyperlipidemia, unspecified: Secondary | ICD-10-CM | POA: Diagnosis not present

## 2024-07-18 DIAGNOSIS — E1169 Type 2 diabetes mellitus with other specified complication: Secondary | ICD-10-CM | POA: Diagnosis not present

## 2024-07-18 DIAGNOSIS — Z008 Encounter for other general examination: Secondary | ICD-10-CM | POA: Diagnosis not present

## 2024-07-18 DIAGNOSIS — Z6836 Body mass index (BMI) 36.0-36.9, adult: Secondary | ICD-10-CM | POA: Diagnosis not present

## 2024-07-25 ENCOUNTER — Telehealth: Payer: Self-pay

## 2024-07-25 NOTE — Telephone Encounter (Signed)
 Ordering provider: Wilbert Bihari, MD Associated diagnoses: OSA (obstructive sleep apnea) WatchPAT PA obtained on 07/25/2024 by Lucie DELENA Ku, CMA. Authorization: No prior authorization is required per [facility] Patient notified of PIN (1234) on 07/25/2024 via Notification Method: MyChart message.  Phone note routed to covering staff for follow-up.

## 2024-08-29 ENCOUNTER — Encounter: Payer: Self-pay | Admitting: Radiology

## 2024-10-10 ENCOUNTER — Other Ambulatory Visit: Payer: Self-pay | Admitting: Thoracic Surgery (Cardiothoracic Vascular Surgery)

## 2024-10-10 DIAGNOSIS — I77810 Thoracic aortic ectasia: Secondary | ICD-10-CM

## 2024-11-01 ENCOUNTER — Ambulatory Visit (HOSPITAL_COMMUNITY)
Admission: RE | Admit: 2024-11-01 | Discharge: 2024-11-01 | Disposition: A | Discharge status: Home / Self Care | Source: Ambulatory Visit | Attending: Thoracic Surgery (Cardiothoracic Vascular Surgery) | Admitting: Thoracic Surgery (Cardiothoracic Vascular Surgery)

## 2024-11-01 DIAGNOSIS — I77810 Thoracic aortic ectasia: Secondary | ICD-10-CM | POA: Diagnosis present

## 2024-11-07 NOTE — Telephone Encounter (Signed)
**Note De-Identified Coreon Simkins Obfuscation** I called the pt but got no answer so I left a message on the pts VM asking him to call Macario back at Dr Dorine office at [facility] at [phone].  I have also sent the pt a [facility] message.

## 2024-11-15 ENCOUNTER — Ambulatory Visit: Admitting: Thoracic Surgery (Cardiothoracic Vascular Surgery)

## 2024-11-17 ENCOUNTER — Telehealth: Payer: Self-pay | Admitting: Cardiology

## 2024-11-17 NOTE — Telephone Encounter (Signed)
 Patient was calling to follow up on home sleep test that was not completed.  Please advise

## 2024-11-18 NOTE — Telephone Encounter (Signed)
**Note De-Identified Abbigal Radich Obfuscation** The pt states that he is not going to do the Itamar-HST that Dr Shlomo ordered on 03/18/2024. I encouraged him to do the study but he refused and requested our address so he can mail it back to us .  I advised him that we do not normally accept the devices back [facility] mail and requested that he return the device in its unopened original box to the office but he stated that that would be impossible for him to do as he and his wife have mobility issues and cannot physically bring it back to us . He stated there is no one else that could return the device for him.  I did give him our [address] in [place] address and advised him to write Attn to: Dr Wilbert Shlomo and the Sleep Studies Dept.  He verbalized understanding and thanked me for calling him back.

## 2024-11-29 ENCOUNTER — Ambulatory Visit: Admitting: Thoracic Surgery (Cardiothoracic Vascular Surgery)

## 2024-12-20 ENCOUNTER — Ambulatory Visit: Admitting: Thoracic Surgery (Cardiothoracic Vascular Surgery)
